# Patient Record
Sex: Female | Born: 1935 | Race: White | Hispanic: No | Smoking: Former smoker
Health system: Southern US, Community
[De-identification: ages and names within clinical notes are randomized; demographics above are authoritative.]

## PROBLEM LIST (undated history)

## (undated) DIAGNOSIS — Z8639 Personal history of other endocrine, nutritional and metabolic disease: Secondary | ICD-10-CM

## (undated) DIAGNOSIS — M899 Disorder of bone, unspecified: Secondary | ICD-10-CM

## (undated) DIAGNOSIS — L719 Rosacea, unspecified: Secondary | ICD-10-CM

## (undated) DIAGNOSIS — G4733 Obstructive sleep apnea (adult) (pediatric): Secondary | ICD-10-CM

## (undated) DIAGNOSIS — I480 Paroxysmal atrial fibrillation: Secondary | ICD-10-CM

## (undated) DIAGNOSIS — M949 Disorder of cartilage, unspecified: Secondary | ICD-10-CM

## (undated) DIAGNOSIS — R682 Dry mouth, unspecified: Secondary | ICD-10-CM

## (undated) DIAGNOSIS — E662 Morbid (severe) obesity with alveolar hypoventilation: Secondary | ICD-10-CM

## (undated) DIAGNOSIS — K2100 Gastro-esophageal reflux disease with esophagitis, without bleeding: Secondary | ICD-10-CM

## (undated) DIAGNOSIS — E559 Vitamin D deficiency, unspecified: Secondary | ICD-10-CM

## (undated) DIAGNOSIS — K5900 Constipation, unspecified: Secondary | ICD-10-CM

## (undated) DIAGNOSIS — J986 Disorders of diaphragm: Secondary | ICD-10-CM

## (undated) DIAGNOSIS — E876 Hypokalemia: Secondary | ICD-10-CM

## (undated) DIAGNOSIS — R059 Cough, unspecified: Secondary | ICD-10-CM

## (undated) DIAGNOSIS — K219 Gastro-esophageal reflux disease without esophagitis: Secondary | ICD-10-CM

## (undated) DIAGNOSIS — R5381 Other malaise: Secondary | ICD-10-CM

## (undated) DIAGNOSIS — I5032 Chronic diastolic (congestive) heart failure: Secondary | ICD-10-CM

## (undated) DIAGNOSIS — R32 Unspecified urinary incontinence: Secondary | ICD-10-CM

## (undated) DIAGNOSIS — J398 Other specified diseases of upper respiratory tract: Secondary | ICD-10-CM

## (undated) DIAGNOSIS — D631 Anemia in chronic kidney disease: Secondary | ICD-10-CM

## (undated) DIAGNOSIS — K635 Polyp of colon: Secondary | ICD-10-CM

## (undated) DIAGNOSIS — K117 Disturbances of salivary secretion: Secondary | ICD-10-CM

## (undated) DIAGNOSIS — I495 Sick sinus syndrome: Secondary | ICD-10-CM

## (undated) DIAGNOSIS — K5730 Diverticulosis of large intestine without perforation or abscess without bleeding: Secondary | ICD-10-CM

## (undated) DIAGNOSIS — Z950 Presence of cardiac pacemaker: Secondary | ICD-10-CM

## (undated) DIAGNOSIS — N189 Chronic kidney disease, unspecified: Principal | ICD-10-CM

## (undated) DIAGNOSIS — D7289 Other specified disorders of white blood cells: Secondary | ICD-10-CM

## (undated) DIAGNOSIS — E785 Hyperlipidemia, unspecified: Secondary | ICD-10-CM

## (undated) DIAGNOSIS — D4620 Refractory anemia with excess of blasts, unspecified: Secondary | ICD-10-CM

## (undated) DIAGNOSIS — R05 Cough: Secondary | ICD-10-CM

## (undated) DIAGNOSIS — D509 Iron deficiency anemia, unspecified: Secondary | ICD-10-CM

## (undated) DIAGNOSIS — F32A Depression, unspecified: Secondary | ICD-10-CM

## (undated) DIAGNOSIS — E860 Dehydration: Secondary | ICD-10-CM

## (undated) DIAGNOSIS — K8020 Calculus of gallbladder without cholecystitis without obstruction: Secondary | ICD-10-CM

## (undated) DIAGNOSIS — I10 Essential (primary) hypertension: Secondary | ICD-10-CM

## (undated) DIAGNOSIS — M1990 Unspecified osteoarthritis, unspecified site: Secondary | ICD-10-CM

## (undated) DIAGNOSIS — E039 Hypothyroidism, unspecified: Secondary | ICD-10-CM

## (undated) DIAGNOSIS — Z7901 Long term (current) use of anticoagulants: Secondary | ICD-10-CM

## (undated) DIAGNOSIS — E669 Obesity, unspecified: Secondary | ICD-10-CM

## (undated) DIAGNOSIS — F329 Major depressive disorder, single episode, unspecified: Secondary | ICD-10-CM

## (undated) HISTORY — DX: Hypokalemia: E87.6

## (undated) HISTORY — DX: Unspecified osteoarthritis, unspecified site: M19.90

## (undated) HISTORY — DX: Cough, unspecified: R05.9

## (undated) HISTORY — DX: Refractory anemia with excess of blasts, unspecified: D46.20

## (undated) HISTORY — DX: Disturbances of salivary secretion: K11.7

## (undated) HISTORY — DX: Unspecified urinary incontinence: R32

## (undated) HISTORY — DX: Vitamin D deficiency, unspecified: E55.9

## (undated) HISTORY — DX: Diverticulosis of large intestine without perforation or abscess without bleeding: K57.30

## (undated) HISTORY — DX: Paroxysmal atrial fibrillation: I48.0

## (undated) HISTORY — DX: Disorder of cartilage, unspecified: M94.9

## (undated) HISTORY — DX: Hyperlipidemia, unspecified: E78.5

## (undated) HISTORY — DX: Major depressive disorder, single episode, unspecified: F32.9

## (undated) HISTORY — DX: Obesity, unspecified: E66.9

## (undated) HISTORY — DX: Iron deficiency anemia, unspecified: D50.9

## (undated) HISTORY — DX: Other specified diseases of upper respiratory tract: J39.8

## (undated) HISTORY — DX: Gastro-esophageal reflux disease with esophagitis: K21.0

## (undated) HISTORY — DX: Constipation, unspecified: K59.00

## (undated) HISTORY — DX: Long term (current) use of anticoagulants: Z79.01

## (undated) HISTORY — DX: Rosacea, unspecified: L71.9

## (undated) HISTORY — DX: Gastro-esophageal reflux disease without esophagitis: K21.9

## (undated) HISTORY — DX: Depression, unspecified: F32.A

## (undated) HISTORY — DX: Essential (primary) hypertension: I10

## (undated) HISTORY — DX: Disorders of diaphragm: J98.6

## (undated) HISTORY — DX: Obstructive sleep apnea (adult) (pediatric): G47.33

## (undated) HISTORY — DX: Chronic kidney disease, unspecified: N18.9

## (undated) HISTORY — DX: Dehydration: E86.0

## (undated) HISTORY — DX: Hypothyroidism, unspecified: E03.9

## (undated) HISTORY — DX: Anemia in chronic kidney disease: D63.1

## (undated) HISTORY — DX: Sick sinus syndrome: I49.5

## (undated) HISTORY — DX: Calculus of gallbladder without cholecystitis without obstruction: K80.20

## (undated) HISTORY — DX: Chronic diastolic (congestive) heart failure: I50.32

## (undated) HISTORY — DX: Dry mouth, unspecified: R68.2

## (undated) HISTORY — DX: Personal history of other endocrine, nutritional and metabolic disease: Z86.39

## (undated) HISTORY — DX: Morbid (severe) obesity with alveolar hypoventilation: E66.2

## (undated) HISTORY — DX: Cough: R05

## (undated) HISTORY — DX: Gastro-esophageal reflux disease with esophagitis, without bleeding: K21.00

## (undated) HISTORY — DX: Other malaise: R53.81

## (undated) HISTORY — DX: Disorder of bone, unspecified: M89.9

## (undated) HISTORY — DX: Other specified disorders of white blood cells: D72.89

## (undated) HISTORY — DX: Presence of cardiac pacemaker: Z95.0

## (undated) HISTORY — DX: Polyp of colon: K63.5

---

## 1962-10-03 HISTORY — PX: TUBAL LIGATION: SHX77

## 1997-10-03 HISTORY — PX: TOTAL KNEE ARTHROPLASTY: SHX125

## 1998-10-03 HISTORY — PX: OTHER SURGICAL HISTORY: SHX169

## 2001-10-03 HISTORY — PX: TOTAL KNEE ARTHROPLASTY: SHX125

## 2006-10-03 HISTORY — PX: CATARACT EXTRACTION: SUR2

## 2009-11-09 ENCOUNTER — Encounter: Payer: Self-pay | Admitting: Internal Medicine

## 2009-11-18 ENCOUNTER — Encounter (INDEPENDENT_AMBULATORY_CARE_PROVIDER_SITE_OTHER): Payer: Self-pay | Admitting: *Deleted

## 2009-12-08 ENCOUNTER — Encounter (INDEPENDENT_AMBULATORY_CARE_PROVIDER_SITE_OTHER): Payer: Self-pay | Admitting: *Deleted

## 2009-12-09 ENCOUNTER — Ambulatory Visit: Payer: Self-pay | Admitting: Internal Medicine

## 2009-12-15 ENCOUNTER — Telehealth: Payer: Self-pay | Admitting: Internal Medicine

## 2009-12-15 ENCOUNTER — Ambulatory Visit: Payer: Self-pay | Admitting: Internal Medicine

## 2009-12-15 DIAGNOSIS — Z860100 Personal history of colon polyps, unspecified: Secondary | ICD-10-CM | POA: Insufficient documentation

## 2009-12-16 ENCOUNTER — Ambulatory Visit (HOSPITAL_COMMUNITY): Admission: RE | Admit: 2009-12-16 | Discharge: 2009-12-16 | Payer: Self-pay | Admitting: Internal Medicine

## 2010-02-01 ENCOUNTER — Encounter: Payer: Self-pay | Admitting: Internal Medicine

## 2010-02-10 DIAGNOSIS — J449 Chronic obstructive pulmonary disease, unspecified: Secondary | ICD-10-CM | POA: Insufficient documentation

## 2010-02-10 DIAGNOSIS — R32 Unspecified urinary incontinence: Secondary | ICD-10-CM | POA: Insufficient documentation

## 2010-02-10 DIAGNOSIS — K5730 Diverticulosis of large intestine without perforation or abscess without bleeding: Secondary | ICD-10-CM | POA: Insufficient documentation

## 2010-02-10 DIAGNOSIS — R1310 Dysphagia, unspecified: Secondary | ICD-10-CM | POA: Insufficient documentation

## 2010-02-10 DIAGNOSIS — R0602 Shortness of breath: Secondary | ICD-10-CM | POA: Insufficient documentation

## 2010-02-10 DIAGNOSIS — K117 Disturbances of salivary secretion: Secondary | ICD-10-CM | POA: Insufficient documentation

## 2010-02-10 DIAGNOSIS — R05 Cough: Secondary | ICD-10-CM

## 2010-02-10 DIAGNOSIS — I10 Essential (primary) hypertension: Secondary | ICD-10-CM | POA: Insufficient documentation

## 2010-02-10 DIAGNOSIS — J309 Allergic rhinitis, unspecified: Secondary | ICD-10-CM | POA: Insufficient documentation

## 2010-02-10 DIAGNOSIS — M1990 Unspecified osteoarthritis, unspecified site: Secondary | ICD-10-CM | POA: Insufficient documentation

## 2010-02-10 DIAGNOSIS — J4489 Other specified chronic obstructive pulmonary disease: Secondary | ICD-10-CM | POA: Insufficient documentation

## 2010-02-10 DIAGNOSIS — H259 Unspecified age-related cataract: Secondary | ICD-10-CM | POA: Insufficient documentation

## 2010-02-10 DIAGNOSIS — E785 Hyperlipidemia, unspecified: Secondary | ICD-10-CM | POA: Insufficient documentation

## 2010-02-10 DIAGNOSIS — R059 Cough, unspecified: Secondary | ICD-10-CM | POA: Insufficient documentation

## 2010-02-11 ENCOUNTER — Ambulatory Visit: Payer: Self-pay | Admitting: Internal Medicine

## 2010-02-12 LAB — CONVERTED CEMR LAB
AST: 50 units/L — ABNORMAL HIGH (ref 0–37)
Albumin: 3.2 g/dL — ABNORMAL LOW (ref 3.5–5.2)
Alkaline Phosphatase: 127 units/L — ABNORMAL HIGH (ref 39–117)
Basophils Absolute: 0 10*3/uL (ref 0.0–0.1)
Calcium: 8.4 mg/dL (ref 8.4–10.5)
GFR calc non Af Amer: 72.02 mL/min (ref >60)
Glucose, Bld: 115 mg/dL — ABNORMAL HIGH (ref 70–99)
Hemoglobin: 12.8 g/dL (ref 12.0–15.0)
Lymphocytes Relative: 30.8 % (ref 12.0–46.0)
Monocytes Relative: 7.4 % (ref 3.0–12.0)
Neutro Abs: 4.1 10*3/uL (ref 1.4–7.7)
RBC: 3.87 M/uL (ref 3.87–5.11)
RDW: 15 % — ABNORMAL HIGH (ref 11.5–14.6)
Sodium: 143 meq/L (ref 135–145)
WBC: 7 10*3/uL (ref 4.5–10.5)

## 2010-02-17 ENCOUNTER — Telehealth: Payer: Self-pay | Admitting: Internal Medicine

## 2010-03-08 ENCOUNTER — Ambulatory Visit: Payer: Self-pay | Admitting: Internal Medicine

## 2010-03-08 LAB — CONVERTED CEMR LAB
Basophils Absolute: 0 10*3/uL (ref 0.0–0.1)
CO2: 33 meq/L — ABNORMAL HIGH (ref 19–32)
Calcium: 8.9 mg/dL (ref 8.4–10.5)
Eosinophils Absolute: 0.1 10*3/uL (ref 0.0–0.7)
Glucose, Bld: 154 mg/dL — ABNORMAL HIGH (ref 70–99)
HCT: 40.2 % (ref 36.0–46.0)
Hemoglobin: 13.7 g/dL (ref 12.0–15.0)
Lymphs Abs: 1.8 10*3/uL (ref 0.7–4.0)
MCHC: 34.2 g/dL (ref 30.0–36.0)
Neutro Abs: 5 10*3/uL (ref 1.4–7.7)
Potassium: 4.1 meq/L (ref 3.5–5.1)
RDW: 14.7 % — ABNORMAL HIGH (ref 11.5–14.6)
Sodium: 140 meq/L (ref 135–145)

## 2010-03-09 ENCOUNTER — Ambulatory Visit: Payer: Self-pay | Admitting: Cardiology

## 2010-03-26 ENCOUNTER — Ambulatory Visit: Payer: Self-pay | Admitting: Internal Medicine

## 2010-03-29 ENCOUNTER — Ambulatory Visit: Payer: Self-pay | Admitting: Internal Medicine

## 2010-06-14 ENCOUNTER — Encounter: Payer: Self-pay | Admitting: Internal Medicine

## 2010-07-05 ENCOUNTER — Ambulatory Visit (HOSPITAL_COMMUNITY): Admission: RE | Admit: 2010-07-05 | Discharge: 2010-07-05 | Payer: Self-pay | Admitting: Internal Medicine

## 2010-08-23 ENCOUNTER — Ambulatory Visit (HOSPITAL_COMMUNITY)
Admission: RE | Admit: 2010-08-23 | Discharge: 2010-08-23 | Payer: Self-pay | Source: Home / Self Care | Admitting: Internal Medicine

## 2010-09-07 ENCOUNTER — Inpatient Hospital Stay (HOSPITAL_COMMUNITY)
Admission: EM | Admit: 2010-09-07 | Discharge: 2010-09-12 | Payer: Self-pay | Source: Home / Self Care | Attending: Cardiovascular Disease | Admitting: Cardiovascular Disease

## 2010-09-08 ENCOUNTER — Encounter: Payer: Self-pay | Admitting: Cardiovascular Disease

## 2010-09-15 ENCOUNTER — Ambulatory Visit: Payer: Self-pay | Admitting: Cardiology

## 2010-09-15 LAB — CONVERTED CEMR LAB: POC INR: 2.5

## 2010-09-17 ENCOUNTER — Encounter
Admission: RE | Admit: 2010-09-17 | Discharge: 2010-09-17 | Payer: Self-pay | Source: Home / Self Care | Attending: Internal Medicine | Admitting: Internal Medicine

## 2010-09-21 ENCOUNTER — Encounter: Payer: Self-pay | Admitting: Family Medicine

## 2010-09-21 ENCOUNTER — Inpatient Hospital Stay (HOSPITAL_COMMUNITY)
Admission: EM | Admit: 2010-09-21 | Discharge: 2010-09-26 | Payer: Self-pay | Source: Home / Self Care | Attending: Cardiology | Admitting: Cardiology

## 2010-09-21 DIAGNOSIS — I4891 Unspecified atrial fibrillation: Secondary | ICD-10-CM | POA: Insufficient documentation

## 2010-09-22 ENCOUNTER — Ambulatory Visit: Payer: Self-pay

## 2010-09-25 ENCOUNTER — Encounter: Payer: Self-pay | Admitting: Internal Medicine

## 2010-09-27 ENCOUNTER — Encounter: Payer: Self-pay | Admitting: Physician Assistant

## 2010-09-27 ENCOUNTER — Emergency Department (HOSPITAL_COMMUNITY)
Admission: EM | Admit: 2010-09-27 | Discharge: 2010-09-27 | Payer: Self-pay | Source: Home / Self Care | Admitting: Emergency Medicine

## 2010-09-28 ENCOUNTER — Encounter: Payer: Self-pay | Admitting: Internal Medicine

## 2010-09-29 ENCOUNTER — Telehealth: Payer: Self-pay | Admitting: Cardiology

## 2010-09-29 ENCOUNTER — Encounter: Payer: Self-pay | Admitting: Internal Medicine

## 2010-09-30 ENCOUNTER — Encounter: Payer: Self-pay | Admitting: Internal Medicine

## 2010-10-01 ENCOUNTER — Encounter: Payer: Self-pay | Admitting: Internal Medicine

## 2010-10-01 ENCOUNTER — Ambulatory Visit: Payer: Self-pay | Admitting: Cardiology

## 2010-10-01 ENCOUNTER — Encounter: Payer: Self-pay | Admitting: Physician Assistant

## 2010-10-01 DIAGNOSIS — I495 Sick sinus syndrome: Secondary | ICD-10-CM | POA: Insufficient documentation

## 2010-10-02 ENCOUNTER — Encounter: Payer: Self-pay | Admitting: Internal Medicine

## 2010-10-03 ENCOUNTER — Encounter: Payer: Self-pay | Admitting: Internal Medicine

## 2010-10-04 ENCOUNTER — Encounter: Payer: Self-pay | Admitting: Internal Medicine

## 2010-10-05 ENCOUNTER — Encounter: Payer: Self-pay | Admitting: Internal Medicine

## 2010-10-07 ENCOUNTER — Encounter: Payer: Self-pay | Admitting: Internal Medicine

## 2010-10-07 ENCOUNTER — Ambulatory Visit
Admission: RE | Admit: 2010-10-07 | Discharge: 2010-10-07 | Payer: Self-pay | Source: Home / Self Care | Attending: Internal Medicine | Admitting: Internal Medicine

## 2010-10-07 DIAGNOSIS — R609 Edema, unspecified: Secondary | ICD-10-CM | POA: Insufficient documentation

## 2010-10-11 ENCOUNTER — Ambulatory Visit: Admit: 2010-10-11 | Payer: Self-pay | Admitting: Internal Medicine

## 2010-10-13 ENCOUNTER — Encounter: Payer: Self-pay | Admitting: Internal Medicine

## 2010-10-14 ENCOUNTER — Encounter: Payer: Self-pay | Admitting: Internal Medicine

## 2010-10-14 LAB — CONVERTED CEMR LAB
POC INR: 1.71
Prothrombin Time: 20.2 s

## 2010-10-18 ENCOUNTER — Encounter: Payer: Self-pay | Admitting: Internal Medicine

## 2010-10-19 ENCOUNTER — Encounter (INDEPENDENT_AMBULATORY_CARE_PROVIDER_SITE_OTHER): Payer: Self-pay | Admitting: Pharmacist

## 2010-10-21 ENCOUNTER — Ambulatory Visit (HOSPITAL_COMMUNITY)
Admission: RE | Admit: 2010-10-21 | Discharge: 2010-10-22 | Payer: Self-pay | Source: Home / Self Care | Attending: Internal Medicine | Admitting: Internal Medicine

## 2010-10-21 ENCOUNTER — Ambulatory Visit (HOSPITAL_BASED_OUTPATIENT_CLINIC_OR_DEPARTMENT_OTHER): Payer: Medicare Other | Admitting: Hematology & Oncology

## 2010-10-21 HISTORY — PX: PACEMAKER PLACEMENT: SHX43

## 2010-10-22 ENCOUNTER — Encounter: Payer: Self-pay | Admitting: Internal Medicine

## 2010-10-23 ENCOUNTER — Encounter: Payer: Self-pay | Admitting: Internal Medicine

## 2010-10-23 NOTE — Discharge Summary (Signed)
  NAME:  Cindy Cervantes, Cindy Cervantes               ACCOUNT NO.:  [id number]  MEDICAL RECORD NO.:  [id number]          PATIENT TYPE:  INP  LOCATION:  3742                         FACILITY:  [facility]  PHYSICIAN:  Luis Abed, MD, FACCDATE OF BIRTH:  Jul 26, 1932  DATE OF ADMISSION:  09/21/2010 DATE OF DISCHARGE:  09/26/2010                              DISCHARGE SUMMARY   ADDENDUM  Followup labs concerning [name] revealed a BMET; sodium of 136, potassium of 3.4, chloride of 93, CO2 of 32, glucose 184, BUN 25, and creatinine of 1.35.  These results have been discussed with Dr. Myrtis Ser. The patient will be given 40 mEq of potassium replacement x1.  She will return home on 20 mEq potassium daily.  It is advised that the patient not be restarted on spironolactone as she was very sensitive to this causing her to have hyperkalemia.  Any restarting of this medication should be done with caution.     [name]. [name], NP   ______________________________ Luis Abed, MD, [facility]    KML/MEDQ  D:  09/26/2010  T:  09/27/2010  Job:  [id number]  Electronically Signed by Joni Reining NP on 10/14/2010 03:47:39 PM Electronically Signed by Willa Rough MD FACC on 10/23/2010 09:10:12 AM

## 2010-10-25 LAB — CBC
Hemoglobin: 9.6 g/dL — ABNORMAL LOW (ref 12.0–15.0)
MCH: 33 pg (ref 26.0–34.0)
MCHC: 31.3 g/dL (ref 30.0–36.0)

## 2010-10-25 LAB — APTT: aPTT: 37 seconds (ref 24–37)

## 2010-10-25 LAB — PROTIME-INR: Prothrombin Time: 22.9 seconds — ABNORMAL HIGH (ref 11.6–15.2)

## 2010-10-25 LAB — SURGICAL PCR SCREEN: MRSA, PCR: NEGATIVE

## 2010-10-27 ENCOUNTER — Encounter: Payer: Self-pay | Admitting: Internal Medicine

## 2010-10-27 ENCOUNTER — Inpatient Hospital Stay (HOSPITAL_COMMUNITY)
Admission: EM | Admit: 2010-10-27 | Discharge: 2010-11-01 | Payer: Self-pay | Source: Home / Self Care | Attending: Internal Medicine | Admitting: Internal Medicine

## 2010-10-27 LAB — GLUCOSE, CAPILLARY: Glucose-Capillary: 127 mg/dL — ABNORMAL HIGH (ref 70–99)

## 2010-10-27 LAB — BASIC METABOLIC PANEL
BUN: 17 mg/dL (ref 6–23)
Chloride: 89 mEq/L — ABNORMAL LOW (ref 96–112)
Glucose, Bld: 121 mg/dL — ABNORMAL HIGH (ref 70–99)
Potassium: 4.2 mEq/L (ref 3.5–5.1)

## 2010-10-27 LAB — DIFFERENTIAL
Basophils Absolute: 0 10*3/uL (ref 0.0–0.1)
Basophils Relative: 0 % (ref 0–1)
Eosinophils Absolute: 0.1 10*3/uL (ref 0.0–0.7)
Eosinophils Relative: 1 % (ref 0–5)
Monocytes Absolute: 0.8 10*3/uL (ref 0.1–1.0)
Neutro Abs: 6.3 10*3/uL (ref 1.7–7.7)

## 2010-10-27 LAB — CBC
MCHC: 32.2 g/dL (ref 30.0–36.0)
Platelets: 319 10*3/uL (ref 150–400)
RDW: 15.9 % — ABNORMAL HIGH (ref 11.5–15.5)

## 2010-10-27 LAB — POCT CARDIAC MARKERS
CKMB, poc: 1.1 ng/mL (ref 1.0–8.0)
CKMB, poc: <1 ng/mL — ABNORMAL LOW (ref 1.0–8.0)
Myoglobin, poc: 158 ng/mL (ref 12–200)
Myoglobin, poc: 48.9 ng/mL (ref 12–200)
Troponin i, poc: <0.05 ng/mL (ref 0.00–0.09)

## 2010-10-27 LAB — PROTIME-INR
INR: 2.52 — ABNORMAL HIGH (ref 0.00–1.49)
Prothrombin Time: 27.3 seconds — ABNORMAL HIGH (ref 11.6–15.2)

## 2010-10-27 LAB — BRAIN NATRIURETIC PEPTIDE: Pro B Natriuretic peptide (BNP): 567 pg/mL — ABNORMAL HIGH (ref 0.0–100.0)

## 2010-10-28 ENCOUNTER — Ambulatory Visit: Admit: 2010-10-28 | Payer: Self-pay | Admitting: Internal Medicine

## 2010-10-28 DIAGNOSIS — G4710 Hypersomnia, unspecified: Secondary | ICD-10-CM

## 2010-10-28 DIAGNOSIS — G4730 Sleep apnea, unspecified: Secondary | ICD-10-CM

## 2010-10-28 DIAGNOSIS — R0902 Hypoxemia: Secondary | ICD-10-CM

## 2010-10-28 DIAGNOSIS — E662 Morbid (severe) obesity with alveolar hypoventilation: Secondary | ICD-10-CM

## 2010-10-28 LAB — MRSA PCR SCREENING: MRSA by PCR: NEGATIVE

## 2010-10-29 LAB — BASIC METABOLIC PANEL
Calcium: 9.1 mg/dL (ref 8.4–10.5)
Creatinine, Ser: 1.33 mg/dL — ABNORMAL HIGH (ref 0.4–1.2)
GFR calc Af Amer: 47 mL/min — ABNORMAL LOW (ref >60)
GFR calc non Af Amer: 39 mL/min — ABNORMAL LOW (ref >60)
Sodium: 140 mEq/L (ref 135–145)

## 2010-10-29 LAB — PROTIME-INR: INR: 3.03 — ABNORMAL HIGH (ref 0.00–1.49)

## 2010-10-30 LAB — PROTIME-INR
INR: 2.88 — ABNORMAL HIGH (ref 0.00–1.49)
Prothrombin Time: 30.2 seconds — ABNORMAL HIGH (ref 11.6–15.2)

## 2010-10-30 LAB — BASIC METABOLIC PANEL
BUN: 13 mg/dL (ref 6–23)
Calcium: 8.9 mg/dL (ref 8.4–10.5)
Creatinine, Ser: 1.35 mg/dL — ABNORMAL HIGH (ref 0.4–1.2)
GFR calc non Af Amer: 38 mL/min — ABNORMAL LOW (ref >60)

## 2010-10-31 LAB — BASIC METABOLIC PANEL
BUN: 13 mg/dL (ref 6–23)
CO2: 38 mEq/L — ABNORMAL HIGH (ref 19–32)
Chloride: 83 mEq/L — ABNORMAL LOW (ref 96–112)
Creatinine, Ser: 1.34 mg/dL — ABNORMAL HIGH (ref 0.4–1.2)

## 2010-11-01 LAB — CBC
MCH: 33 pg (ref 26.0–34.0)
MCV: 107 fL — ABNORMAL HIGH (ref 78.0–100.0)
Platelets: 192 10*3/uL (ref 150–400)
RBC: 2.85 MIL/uL — ABNORMAL LOW (ref 3.87–5.11)
RDW: 16.7 % — ABNORMAL HIGH (ref 11.5–15.5)

## 2010-11-01 LAB — BASIC METABOLIC PANEL
CO2: 43 mEq/L (ref 19–32)
Calcium: 8.8 mg/dL (ref 8.4–10.5)
Chloride: 82 mEq/L — ABNORMAL LOW (ref 96–112)
Glucose, Bld: 107 mg/dL — ABNORMAL HIGH (ref 70–99)
Sodium: 140 mEq/L (ref 135–145)

## 2010-11-02 NOTE — Letter (Signed)
Summary: Previsit letter  [facility]  [address] [place], [place] [address]   Phone: [phone]  Fax: [phone]       11/18/2009 MRN: [id number]  [facility] [address] [place], [place]  [address]  Dear Cindy Cervantes,  Welcome to the Gastroenterology Division at [facility]    You are scheduled to see a nurse for your pre-procedure visit on 12-09-09 at 1PM on the 3rd floor at [facility], [address]  We ask that you try to arrive at our office 15 minutes prior to your appointment time to allow for check-in.  Your nurse visit will consist of discussing your medical and surgical history, your immediate family medical history, and your medications.    Please bring a complete list of all your medications or, if you prefer, bring the medication bottles and we will list them.  We will need to be aware of both prescribed and over the counter drugs.  We will need to know exact dosage information as well.  If you are on blood thinners (Coumadin, Plavix, Aggrenox, Ticlid, etc.) please call our office today/prior to your appointment, as we need to consult with your physician about holding your medication.   Please be prepared to read and sign documents such as consent forms, a financial agreement, and acknowledgement forms.  If necessary, and with your consent, a friend or relative is welcome to sit-in on the nurse visit with you.  Please bring your insurance card so that we may make a copy of it.  If your insurance requires a referral to see a specialist, please bring your referral form from your primary care physician.  No co-pay is required for this nurse visit.     If you cannot keep your appointment, please call [phone] to cancel or reschedule prior to your appointment date.  This allows [place] the opportunity to schedule an appointment for another patient in need of care.    Thank you for choosing [facility] for your medical needs.  We  appreciate the opportunity to care for you.  Please visit [place] at our website  to learn more about our practice.                     Sincerely.                                                                                                                   The Gastroenterology Division

## 2010-11-02 NOTE — Letter (Signed)
Summary: [facility]   Imported By: Sherian Rein 02/17/2010 08:19:57  _____________________________________________________________________  External Attachment:    Type:   Image     Comment:   External Document

## 2010-11-02 NOTE — Miscellaneous (Signed)
Summary: [facility]  Clinical Lists Changes  Medications: Added new medication of MOVIPREP 100 GM  SOLR (PEG-KCL-NACL-NASULF-NA ASC-C) As per prep instructions. - Signed Rx of MOVIPREP 100 GM  SOLR (PEG-KCL-NACL-NASULF-NA ASC-C) As per prep instructions.;  #1 x 0;  Signed;  Entered by: Ezra Sites RN;  Authorized by: Hart Carwin MD;  Method used: Electronically to [facility]. #[id number]*, [address] [place], [place], [place]  [address], Ph: [phone], Fax: [phone] Allergies: Added new allergy or adverse reaction of MORPHINE Observations: Added new observation of NKA: F (12/09/2009 12:46)    Prescriptions: MOVIPREP 100 GM  SOLR (PEG-KCL-NACL-NASULF-NA ASC-C) As per prep instructions.  #1 x 0   Entered by:   Ezra Sites RN   Authorized by:   Hart Carwin MD   Signed by:   Ezra Sites RN on 12/09/2009   Method used:   Electronically to        [facility]. [phone]* (retail)       [phone]       [place], [place]  [address]       Ph: [phone]       Fax: [phone]   RxID:   [id number]

## 2010-11-02 NOTE — Assessment & Plan Note (Signed)
Summary: Pulmonary/ ext ov with walking sats = ok after 1 lap   Copy to:  pcp Primary Provider/Referring Provider:  Dr. Murray Hodgkins  CC:  Acute visit.  Pt c/o increased SOB x 3 wks.  She gets out of breath walking from room to room at home.  She also feels very fatigued and states "no energy at all".  She states that her cough is the same- "not bad"..  History of Present Illness: 63 yowf quit smoking 1984 with complications from Tummy tuck in [place] 2001.  Feb 11, 2010  variable doe x 2001 better after singing  worse x 6 month > best days can do  grocery shopping but not mall due to legs give out assoc with cough and sensation of pnd and cough to point of vomiting  cough is worse when lie down  but breathing ok,  assoc wih variable leg swelling. imp was uacs and ? diast chf  rec: Stop hctz and potassium Start Maxzide 75 one daily start nexium 40 mg Take  one 30-60 min before first meal of the day and pepcid 20 mg one at bedtime if drainage use chlortrimeton 4 mg one at bedtime  March 08, 2010  cc  fatigued and states "no energy at all".  She states that her cough is the same- "not bad".   cough is much better.   sleeping ok flat.  can't walk 50 feet without symptoms of weak and sob. leg swelling gone.  no orthostatic lightheadedness.  Pt denies any significant sore throat, dysphagia, itching, sneezing,  nasal congestion or excess secretions,  fever, chills, sweats, unintended wt loss, pleuritic or exertional cp, hempoptysis, orthopnea pnd.   Pt also denies any obvious fluctuation in symptoms with weather or environmental change or other alleviating or aggravating factors.       Current Medications (verified): 1)  Niaspan 1000 Mg Cr-Tabs (Niacin (Antihyperlipidemic)) .... Take 1 Tablet By Mouth Two Times A Day 2)  Xyzal 5 Mg Tabs (Levocetirizine Dihydrochloride) .... Take 1 Tablet By Mouth Once A Day 3)  Aspirin 81 Mg  Tabs (Aspirin) .... Take 1 Tablet By Mouth Once A Day 4)  Maxzide 75-50  Mg Tabs (Triamterene-Hctz) .... One Daily 5)  Nexium 40 Mg  Cpdr (Esomeprazole Magnesium) .... Take  One 30-60 Min Before First Meal of The Day 6)  Pepcid 20 Mg Tabs (Famotidine) .Marland Kitchen.. 1 At Bedtime 7)  Chlor-Trimeton 4 Mg Tabs (Chlorpheniramine Maleate) .... One Every 6 Hours As Needed For Drainage  Allergies (verified): 1)  ! Morphine 2)  ! Toviaz (Fesoterodine Fumarate)  Past History:  Past Medical History: URINARY INCONTINENCE (ICD-788.30) KNEE JOINT REPLACEMENT BY OTHER MEANS (ICD-V43.65) OSTEOARTHRITIS (ICD-715.90) C O P D (ICD-496)  pft's rec...................................Marland KitchenSherene Sires    - PFT's March 29 2010 >> OBESITY     - HC03 36  Feb 11 2010  ? OHS ALLERGIC RHINITIS (ICD-477.9) HYPERTENSION (ICD-401.9) UNSPECIFIED SENILE CATARACT (ICD-366.10) HYPERLIPIDEMIA (ICD-272.4) XEROSTOMIA (ICD-527.7) DYSPHAGIA UNSPECIFIED (ICD-787.20) COUGH (ICD-786.2)      - Better on gerd rx March 08, 2010      - Sinus ct March 08, 2010 >> OBSTRUCTIVE SLEEP APNEA (ICD-327.23) DIVERTICULOSIS, COLON (ICD-562.10) DYSPNEA (ICD-786.05)     - No desat walking March 08, 2010      - CT chest March 09 2010 >>> PERSONAL HISTORY OF COLONIC POLYPS (ICD-V12.72)    Vital Signs:  Patient profile:   77 year old female Weight:  211 pounds O2 Sat:      95 % on Room air Temp:     97.5 degrees F oral Pulse rate:   85 / minute BP sitting:   128 / 80  (right arm) Cuff size:   large  Vitals Entered By: Cindy Cervantes (March 08, 2010 9:32 AM)  O2 Flow:  Room air  Serial Vital Signs/Assessments:  Comments: 10:07 AM Ambulatory Pulse Oximetry  Resting; HR__88___    02 Sat__95%ra___  Lap1 (185 feet)   HR__101___   02 Sat__94%ra___ Lap2 (185 feet)   HR_____   02 Sat_____    Lap3 (185 feet)   HR_____   02 Sat_____  ___Test Completed without Difficulty _x__Test Stopped due to:pt c/o feeling shaky and 'a little SOB'.   By: Cindy Cervantes    Physical Exam  Additional Exam:  amb obese wf nad  somewhat depressed appearing, minimally hoarse wt 224 Feb 11, 2010 > 211 March 08, 2010  HEENT: nl dentition, turbinates, and orophanx. Nl external ear canals without cough reflex NECK :  without JVD/Nodes/TM/ nl carotid upstrokes bilaterally LUNGS: no acc muscle use, clear to A and P bilaterally without cough on insp or exp maneuvers CV:  RRR  no s3 or murmur or increase in P2, no edema now ABD:  soft and nontender with nl excursion in the supine position. No bruits or organomegaly, bowel sounds nl MS:  warm without deformities, calf tenderness, cyanosis or clubbing      Sodium                    140 mEq/L                   135-145   Potassium                 4.1 mEq/L                   3.5-5.1   Chloride                  100 mEq/L                   96-112   Carbon Dioxide       [H]  33 mEq/L                    19-32   Glucose              [H]  154 mg/dL                   95-63   BUN                       19 mg/dL                    8-75   Creatinine                1.0 mg/dL                   6.4-3.3   Calcium                   8.9 mg/dL                   2.9-51.8   GFR  60.03 mL/min                >60  Tests: (2) CBC Platelet w/Diff (CBCD)   White Cell Count          7.4 K/uL                    4.5-10.5   Red Cell Count            4.14 Mil/uL                 3.87-5.11   Hemoglobin                13.7 g/dL                   13.2-44.0   Hematocrit                40.2 %                      36.0-46.0   MCV                       97.0 fl                     78.0-100.0   MCHC                      34.2 g/dL                   10.2-72.5   RDW                  [H]  14.7 %                      11.5-14.6   Platelet Count            188.0 K/uL                  150.0-400.0   Neutrophil %              67.4 %                      43.0-77.0   Lymphocyte %              24.3 %                      12.0-46.0   Monocyte %                6.3 %                       3.0-12.0    Eosinophils%              1.7 %                       0.0-5.0   Basophils %               0.3 %                       0.0-3.0   Neutrophill Absolute      5.0 K/uL                    1.4-7.7   Lymphocyte Absolute  1.8 K/uL                    0.7-4.0   Monocyte Absolute         0.5 K/uL                    0.1-1.0  Eosinophils, Absolute                             0.1 K/uL                    0.0-0.7   Basophils Absolute        0.0 K/uL                    0.0-0.1  Tests: (3) B-Type Natiuretic Peptide (BNPR)  B-Type Natriuetic Peptide                             96.8 pg/mL                  0.0-100.0  Impression & Recommendations:  Problem # 1:  DYSPNEA (ICD-786.05) Not reproducible here, more tired than weak  Problem # 2:  ABNORMAL LUNG XRAY (ICD-793.1) needs chest ct after check renal function again  Problem # 3:  C O P D (ICD-496) For f/u pft's this month.  Problem # 4:  COUGH (ICD-786.2) Much better but not completely resolved.  Most likely this is Upper airway cough syndrome, so named because it's frequently impossible to sort out how much is  CR/sinusitis with freq throat clearing (which can be related to primary GERD)   vs  causing  secondary extra esophageal GERD from wide swings in gastric pressure that occur with throat clearing, promoting self use of mint and menthol lozenges that reduce the lower esophageal sphincter tone and exacerbate the problem further These are the same pts who not infrequently have failed to tolerate ace inhibitors,  dry powder inhalers or biphosphonates or report having reflux symptoms that don't respond to standard doses of PPI  Next step is Sinus Ct, continue gerd rx.  Each maintenance medication was reviewed in detail including most importantly the difference between maintenance prns and under what circumstances the prns are to be used.  In addition, these two groups (for which the patient should keep up with refills) were distinguished from a  third group :  meds that are used only short term with the intent to complete a course of therapy and then not refill them.  The med list was then fully reconciled and reorganized to reflect this important distinction.   Medications Added to Medication List This Visit: 1)  Pepcid 20 Mg Tabs (Famotidine) .Marland Kitchen.. 1 at bedtime  Other Orders: Pulse Oximetry, Ambulatory ([address]) EKG w/ Interpretation (93000) Est. Patient Level IV ([address]) TLB-BMP (Basic Metabolic Panel-BMET) (80048-METABOL) TLB-CBC Platelet - w/Differential (85025-CBCD) TLB-BNP (B-Natriuretic Peptide) (83880-BNPR) Misc. Referral (Misc. Ref) Misc. Referral (Misc. Ref)  Patient Instructions: 1)  See Patient Care Coordinator before leaving for CT of chest  and sinus which need  to be done as soon as we recheck your kidney function   Cardio[employer] ECG  ID: [id number] Patient: Cindy Cervantes, Cindy Cervantes DOB: [birth year] Age: 77 Years Old Sex: Female Race: White Height: 64 Weight: 211 Status: Unconfirmed Past Medical History:  URINARY INCONTINENCE (ICD-788.30) KNEE JOINT REPLACEMENT BY OTHER MEANS (ICD-V43.65) OSTEOARTHRITIS (ICD-715.90) C  O P D (ICD-496)  pft's rec...................................Marland KitchenSherene Sires OBESITY     - WJ19 14  Feb 11 2010  ? OHS ALLERGIC RHINITIS (ICD-477.9) HYPERTENSION (ICD-401.9) UNSPECIFIED SENILE CATARACT (ICD-366.10) HYPERLIPIDEMIA (ICD-272.4) XEROSTOMIA (ICD-527.7) DYSPHAGIA UNSPECIFIED (ICD-787.20) COUGH (ICD-786.2) OBSTRUCTIVE SLEEP APNEA (ICD-327.23) DIVERTICULOSIS, COLON (ICD-562.10) DYSPNEA (ICD-786.05) PERSONAL HISTORY OF COLONIC POLYPS (ICD-V12.72)   Recorded: 03/08/2010 09:53 AM P/PR: 132 ms / 137 ms - Heart rate (maximum exercise) QRS: 86 QT/QTc/QTd: 373 ms / 412 ms / 142 ms - Heart rate (maximum exercise)  P/QRS/T axis: 62 deg / 51 deg / 31 deg - Heart rate (maximum exercise)  Heartrate: 82 bpm  Interpretation:   sinus rhythm  premature supraventricular complexes  RSR' in V1  minimal  right-precordial repolarization disturbance, consider feminine pattern   flat or low negative T in V2 V3   Borderline ECG

## 2010-11-02 NOTE — Progress Notes (Signed)
Summary: talk to nurse  Phone Note Call from Patient   Caller: Patient Call For: Cindy Cervantes Summary of Call: returning nurse's call Initial call taken by: Rickard Patience,  Feb 17, 2010 1:36 PM  Follow-up for Phone Call        pt informed of MW recs for allergy panel. Zackery Barefoot CMA  Feb 17, 2010 2:04 PM

## 2010-11-02 NOTE — Miscellaneous (Signed)
Summary: Orders Update pft charges  Clinical Lists Changes  Orders: Added new Service order of Carbon Monoxide diffusing w/capacity (94720) - Signed Added new Service order of Lung Volumes (94240) - Signed Added new Service order of Spirometry (Pre & Post) (94060) - Signed 

## 2010-11-02 NOTE — Letter (Signed)
Summary: [facility] Instructions  [facility]  [address] [place], [place] [address]   Phone: [phone]  Fax: [phone]       Cindy Cervantes    May 08, 1932    MRN: [id number]        Procedure Day /Date:  Tuesday  12/15/09     Arrival Time:  2:00pm     Procedure Time:  3:00pm     Location of Procedure:                    _X _  [facility] (4th Floor)                        PREPARATION FOR COLONOSCOPY WITH MOVIPREP   Starting 5 days prior to your procedure  Thursday 03/10  do not eat nuts, seeds, popcorn, corn, beans, peas,  salads, or any raw vegetables.  Do not take any fiber supplements (e.g. Metamucil, Citrucel, and Benefiber).  THE DAY BEFORE YOUR PROCEDURE         DATE: 03/14   DAY: Monday  1.  Drink clear liquids the entire day-NO SOLID FOOD  2.  Do not drink anything colored red or purple.  Avoid juices with pulp.  No orange juice.  3.  Drink at least 64 oz. (8 glasses) of fluid/clear liquids during the day to prevent dehydration and help the prep work efficiently.  CLEAR LIQUIDS INCLUDE: Water Jello Ice Popsicles Tea (sugar ok, no milk/cream) Powdered fruit flavored drinks Coffee (sugar ok, no milk/cream) Gatorade Juice: apple, white grape, white cranberry  Lemonade Clear bullion, consomm, broth Carbonated beverages (any kind) Strained chicken noodle soup Hard Candy                             4.  In the morning, mix first dose of MoviPrep solution:    Empty 1 Pouch A and 1 Pouch B into the disposable container    Add lukewarm drinking water to the top line of the container. Mix to dissolve    Refrigerate (mixed solution should be used within 24 hrs)  5.  Begin drinking the prep at 5:00 p.m. The MoviPrep container is divided by 4 marks.   Every 15 minutes drink the solution down to the next mark (approximately 8 oz) until the full liter is complete.   6.  Follow completed prep with 16 oz of clear liquid of your choice  (Nothing red or purple).  Continue to drink clear liquids until bedtime.  7.  Before going to bed, mix second dose of MoviPrep solution:    Empty 1 Pouch A and 1 Pouch B into the disposable container    Add lukewarm drinking water to the top line of the container. Mix to dissolve    Refrigerate  THE DAY OF YOUR PROCEDURE      DATE:  03/15  DAY: Tuesday  Beginning at  10:00 a.m. (5 hours before procedure):         1. Every 15 minutes, drink the solution down to the next mark (approx 8 oz) until the full liter is complete.  2. Follow completed prep with 16 oz. of clear liquid of your choice.    3. You may drink clear liquids until 1:00pm  (2 HOURS BEFORE PROCEDURE).   MEDICATION INSTRUCTIONS  Unless otherwise instructed, you should take regular prescription medications with a small sip of water  as early as possible the morning of your procedure.    Additional medication instructions:  Hold HCTZ morning of procedure.         OTHER INSTRUCTIONS  You will need a responsible adult at least 75 years of age to accompany you and drive you home.   This person must remain in the waiting room during your procedure.  Wear loose fitting clothing that is easily removed.  Leave jewelry and other valuables at home.  However, you may wish to bring a book to read or  an iPod/MP3 player to listen to music as you wait for your procedure to start.  Remove all body piercing jewelry and leave at home.  Total time from sign-in until discharge is approximately 2-3 hours.  You should go home directly after your procedure and rest.  You can resume normal activities the  day after your procedure.  The day of your procedure you should not:   Drive   Make legal decisions   Operate machinery   Drink alcohol   Return to work  You will receive specific instructions about eating, activities and medications before you leave.    The above instructions have been reviewed and explained  to me by   Ezra Sites RN  December 09, 2009 1:10 PM   I fully understand and can verbalize these instructions _____________________________ Date _________

## 2010-11-02 NOTE — Assessment & Plan Note (Signed)
Summary: Pulmonary/ new pt eval ? uacs > rx max acid    Visit Type:  Initial Consult Copy to:  pcp Primary Provider/Referring Provider:  Dr. Murray Hodgkins  CC:  Pt here for pulmonary consult. Pt c/o SOB intermittent x 10 years after having "tummy tuck" worse with exertion.  History of Present Illness: 22 yowf quit smoking 1984 with complications from Tummy tuck in [place] 2001.  Feb 11, 2010  variable doe x 2001 better after singing  worse x 6 month > best days can do  grocery shopping but not mall due to legs give out assoc with cough and sensation of pnd and cough to point of vomiting  cough is worse when lie down  but breathing ok,  assoc wih variable leg swelling. Pt denies any significant sore throat, dysphagia, itching, sneezing,  nasal congestion or excess secretions,  fever, chills, sweats, unintended wt loss, pleuritic or exertional cp, hempoptysis, change in activity tolerance  orthopnea pnd.  Pt also denies any obvious fluctuation in symptoms with weather or environmental change or other alleviating or aggravating factors.       Preventive Screening-Counseling & Management  Alcohol-Tobacco     Alcohol drinks/day: <1     Alcohol type: wine     Smoking Status: quit     Packs/Day: 0.5     Year Started: 1952     Year Quit: 1984  Current Medications (verified): 1)  Hydrochlorothiazide 25 Mg Tabs (Hydrochlorothiazide) .... Take 1 Tablet By Mouth Once A Day 2)  Klor-Con M20 20 Meq Cr-Tabs (Potassium Chloride Crys Cr) .... Take 1 Tablet By Mouth Once A Day 3)  Niaspan 1000 Mg Cr-Tabs (Niacin (Antihyperlipidemic)) .... Take 1 Tablet By Mouth Two Times A Day 4)  Xyzal 5 Mg Tabs (Levocetirizine Dihydrochloride) .... Take 1 Tablet By Mouth Once A Day 5)  Aspirin 81 Mg  Tabs (Aspirin) .... Take 1 Tablet By Mouth Once A Day  Allergies (verified): 1)  ! Morphine 2)  ! Toviaz (Fesoterodine Fumarate)  Past History:  Past Medical History: URINARY INCONTINENCE (ICD-788.30) KNEE JOINT  REPLACEMENT BY OTHER MEANS (ICD-V43.65) OSTEOARTHRITIS (ICD-715.90) C O P D (ICD-496)  pft's rec...................................Marland KitchenSherene Sires OBESITY     - [id number]  Feb 11 2010  ? OHS ALLERGIC RHINITIS (ICD-477.9) HYPERTENSION (ICD-401.9) UNSPECIFIED SENILE CATARACT (ICD-366.10) HYPERLIPIDEMIA (ICD-272.4) XEROSTOMIA (ICD-527.7) DYSPHAGIA UNSPECIFIED (ICD-787.20) COUGH (ICD-786.2) OBSTRUCTIVE SLEEP APNEA (ICD-327.23) DIVERTICULOSIS, COLON (ICD-562.10) DYSPNEA (ICD-786.05) PERSONAL HISTORY OF COLONIC POLYPS (ICD-V12.72)    Social History: Marital Status: Widowed. 2002 (married 1955). Originally from [place], [place]. Pt lives in a single level home at [employer] ([place] since 09/2008. Moved from [place], [place] Children: yes 4 Occupation: retired Patient states former smoker.  Quit 1984 1/2ppd Packs/Day:  0.5 Alcohol drinks/day:  <1  Review of Systems       The patient complains of shortness of breath with activity, shortness of breath at rest, productive cough, acid heartburn, sore throat, sneezing, and hand/feet swelling.  The patient denies non-productive cough, coughing up blood, chest pain, irregular heartbeats, indigestion, loss of appetite, weight change, abdominal pain, difficulty swallowing, tooth/dental problems, headaches, nasal congestion/difficulty breathing through nose, itching, ear ache, anxiety, depression, joint stiffness or pain, rash, change in color of mucus, and fever.    Vital Signs:  Patient profile:   77 year old female Height:      64 inches Weight:      224 pounds BMI:     38.59 O2 Sat:  96 % on Room air Temp:     97.8 degrees F oral Pulse rate:   84 / minute BP sitting:   130 / 82  (left arm) Cuff size:   regular  Vitals Entered By: Zackery Barefoot CMA (Feb 11, 2010 1:53 PM)  O2 Flow:  Room air CC: Pt here for pulmonary consult. Pt c/o SOB intermittent x 10 years after having "tummy tuck" worse with exertion Comments Medications  reviewed with patient Verified contact number and pharmacy with patient Zackery Barefoot CMA  Feb 11, 2010 1:53 PM    Physical Exam  Additional Exam:  amb obese wf nad wt 224 Feb 11, 2010 HEENT: nl dentition, turbinates, and orophanx. Nl external ear canals without cough reflex NECK :  without JVD/Nodes/TM/ nl carotid upstrokes bilaterally LUNGS: no acc muscle use, clear to A and P bilaterally without cough on insp or exp maneuvers CV:  RRR  no s3 or murmur or increase in P2, no edema   ABD:  soft and nontender with nl excursion in the supine position. No bruits or organomegaly, bowel sounds nl MS:  warm without deformities, calf tenderness, cyanosis or clubbing SKIN: warm and dry without lesions   NEURO:  alert, approp, no deficits     Sodium                    143 mEq/L                   135-145   Potassium            [L]  3.4 mEq/L                   3.5-5.1   Chloride                  99 mEq/L                    96-112   Carbon Dioxide       [H]  36 mEq/L                    19-32   Glucose              [H]  115 mg/dL                   16-10   BUN                       14 mg/dL                    9-60   Creatinine                0.8 mg/dL                   4.5-4.0   Calcium                   8.4 mg/dL                   9.8-11.9   GFR                       72.02 mL/min                >60  Tests: (2) CBC Platelet w/Diff (CBCD)   White Cell Count  7.0 K/uL                    4.5-10.5   Red Cell Count            3.87 Mil/uL                 3.87-5.11   Hemoglobin                12.8 g/dL                   86.5-78.4   Hematocrit                37.0 %                      36.0-46.0   MCV                       95.6 fl                     78.0-100.0   MCHC                      34.5 g/dL                   69.6-29.5   RDW                  [H]  15.0 %                      11.5-14.6   Platelet Count       [L]  142.0 K/uL                  150.0-400.0   Neutrophil %              58.1  %                      43.0-77.0   Lymphocyte %              30.8 %                      12.0-46.0   Monocyte %                7.4 %                       3.0-12.0   Eosinophils%              3.3 %                       0.0-5.0   Basophils %               0.4 %                       0.0-3.0   Neutrophill Absolute      4.1 K/uL                    1.4-7.7   Lymphocyte Absolute       2.2 K/uL                    0.7-4.0   Monocyte Absolute         0.5 K/uL  0.1-1.0  Eosinophils, Absolute                             0.2 K/uL                    0.0-0.7   Basophils Absolute        0.0 K/uL                    0.0-0.1  Tests: (3) Hepatic/Liver Function Panel (HEPATIC)   Total Bilirubin           0.7 mg/dL                   9.8-1.1   Direct Bilirubin          0.3 mg/dL                   9.1-4.7   Alkaline Phosphatase [H]  127 U/L                     39-117   AST                  [H]  50 U/L                      0-37   ALT                  [H]  45 U/L                      0-35   Total Protein             6.3 g/dL                    8.2-9.5   Albumin              [L]  3.2 g/dL                    6.2-1.3  Tests: (4) TSH (TSH)   FastTSH                   2.02 uIU/mL                 0.35-5.50  Tests: (5) B-Type Natiuretic Peptide (BNPR)  B-Type Natriuetic Peptide                        [H]  178.2 pg/mL                 0.0-100.0  CXR  Procedure date:  02/11/2010  Findings:      Findings: Elevation of the right hemidiaphragm with interposition of the hepatic flexure of the colon between the liver in the right hemidiaphragm.  Atelectasis and/or scarring in the right lower lobe and right middle lobe as a result.  Lungs otherwise clear. Cardiomediastinal silhouette unremarkable for age, with only mild thoracic aortic atherosclerosis.  No pleural effusions.  Visualized bony thorax intact.   IMPRESSION: Elevation of the right hemidiaphragm with associated linear atelectasis  and/or scarring in the right lower lobe and right middle lobe.  No acute cardiopulmonary disease otherwise.    Impression & Recommendations:  Problem # 1:  COUGH (ICD-786.2) The most common causes of chronic cough in immunocompetent adults include: upper airway cough syndrome (UACS), previously referred to  as postnasal drip syndrome,  caused by variety of rhinosinus conditions; (2) asthma; (3) GERD; (4) chronic bronchitis from cigarette smoking or other inhaled environmental irritants; (5) nonasthmatic eosinophilic bronchitis; and (6) bronchiectasis. These conditions, singly or in combination, have accounted for up to 94% of the causes of chronic cough in prospective studies.   Classic Upper airway cough syndrome, so named because it's frequently impossible to sort out how much is  CR/sinusitis with freq throat clearing (which can be related to primary GERD)   vs  causing  secondary extra esophageal GERD from wide swings in gastric pressure that occur with throat clearing, promoting self use of mint and menthol lozenges that reduce the lower esophageal sphincter tone and exacerbate the problem further These are the same pts who not infrequently have failed to tolerate ace inhibitors,  dry powder inhalers or biphosphonates or report having reflux symptoms that don't respond to standard doses of PPI  For now try rx with ppi and diet  NB the  ramp to expected improvement (and for that matter, worsening, if a chronic effective medication is stopped)  can be measured in weeks, not days, a common misconception because this is not Heartburn with no immediate cause and effect relationship so that response to therapy or lack thereof can be very difficult to assess.   Problem # 2:  HYPERTENSION (ICD-401.9)  The following medications were removed from the medication list:    Hydrochlorothiazide 25 Mg Tabs (Hydrochlorothiazide) .Marland Kitchen... Take 1 tablet by mouth once a day Her updated medication list for this  problem includes:    Maxzide 75-50 Mg Tabs (Triamterene-hctz) ..... One daily  BNP minimally elevated but does have tendency to leg swelling so rec change hctz to maxzide   Problem # 3:  ABNORMAL LUNG XRAY (ICD-793.1) changes appear chronic as do symptoms so no further w/u anticipated  Problem # 4:  DYSPNEA (ICD-786.05) HC03 36 so likely has component of obesity hypoventilation, doubt sign copd  Check pft's then ? refer for rehab  Medications Added to Medication List This Visit: 1)  Hydrochlorothiazide 25 Mg Tabs (Hydrochlorothiazide) .... Take 1 tablet by mouth once a day 2)  Klor-con M20 20 Meq Cr-tabs (Potassium chloride crys cr) .... Take 1 tablet by mouth once a day 3)  Niaspan 1000 Mg Cr-tabs (Niacin (antihyperlipidemic)) .... Take 1 tablet by mouth two times a day 4)  Xyzal 5 Mg Tabs (Levocetirizine dihydrochloride) .... Take 1 tablet by mouth once a day 5)  Aspirin 81 Mg Tabs (Aspirin) .... Take 1 tablet by mouth once a day 6)  Maxzide 75-50 Mg Tabs (Triamterene-hctz) .... One daily 7)  Nexium 40 Mg Cpdr (Esomeprazole magnesium) .... Take  one 30-60 min before first meal of the day 8)  Pepcid 20 Mg Tabs (Famotidine) 9)  Chlor-trimeton 4 Mg Tabs (Chlorpheniramine maleate) .... One every 6 hours as needed for drainage  Other Orders: T-Allergy Profile Region II-DC, [place], [place], [place], [place] (5484) T-2 View CXR (71020TC) New Patient Level V [phone]) TLB-BMP (Basic Metabolic Panel-BMET) (80048-METABOL) TLB-CBC Platelet - w/Differential (85025-CBCD) TLB-Hepatic/Liver Function Pnl (80076-HEPATIC) TLB-TSH (Thyroid Stimulating Hormone) (84443-TSH) TLB-BNP (B-Natriuretic Peptide) (83880-BNPR) Prescription Created Electronically [phone])  Patient Instructions: 1)  Stop hctz and potassium 2)  Start Maxzide 75 one daily 3)  start nexium 40 mg Take  one 30-60 min before first meal of the day and pepcid 20 mg one at bedtime 4)  if drainage use chlortrimeton 4 mg one at bedtime  5)  GERD (REFLUX)  is a common cause of respiratory symptoms. It commonly presents without heartburn and can be treated with medication, but also with lifestyle changes including avoidance of late meals, excessive alcohol, smoking cessation, and avoid fatty foods, chocolate, peppermint, colas, red wine, and acidic juices such as orange juice. NO MINT OR MENTHOL PRODUCTS SO NO COUGH DROPS  6)  USE SUGARLESS CANDY INSTEAD (jolley ranchers)  7)  NO OIL BASED VITAMINS  8)  Please schedule a follow-up appointment in 4weeks, sooner if needed with PFT's on return Prescriptions: NEXIUM 40 MG  CPDR (ESOMEPRAZOLE MAGNESIUM) Take  one 30-60 min before first meal of the day  #30 x 2   Entered and Authorized by:   Nyoka Cowden MD   Signed by:   Nyoka Cowden MD on 02/11/2010   Method used:   Electronically to        [facility]. [phone]* (retail)       [phone]       [place], [place]  [address]       Ph: [phone]       Fax: [phone]   RxID:   [id number] MAXZIDE 75-50 MG TABS (TRIAMTERENE-HCTZ) one daily  #30 x 11   Entered and Authorized by:   Nyoka Cowden MD   Signed by:   Nyoka Cowden MD on 02/11/2010   Method used:   Electronically to        [facility]. [phone]* (retail)       [phone]       [place], [place]  [address]       Ph: [phone]       Fax: [phone]   RxID:   [id number]    Immunization History:  Influenza Immunization History:    Influenza:  historical (07/06/2009)    CXR  Procedure date:  02/11/2010  Findings:      Findings: Elevation of the right hemidiaphragm with interposition of the hepatic flexure of the colon between the liver in the right hemidiaphragm.  Atelectasis and/or scarring in the right lower lobe and right middle lobe as a result.  Lungs otherwise clear. Cardiomediastinal silhouette unremarkable for age, with only mild thoracic aortic atherosclerosis.  No  pleural effusions.  Visualized bony thorax intact.   IMPRESSION: Elevation of the right hemidiaphragm with associated linear atelectasis and/or scarring in the right lower lobe and right middle lobe.  No acute cardiopulmonary disease otherwise.

## 2010-11-02 NOTE — Assessment & Plan Note (Signed)
Summary: Pulmonary/ final f/u    Copy to:  pcp Primary Provider/Referring Provider:  Dr. Murray Hodgkins  CC:  weak and sob.  History of Present Illness: 38 yowf quit smoking 1984 with complications from Tummy tuck in [place] 2001.  Feb 11, 2010  variable doe x 2001 better after singing  worse x 6 month > best days can do  grocery shopping but not mall due to legs give out assoc with cough and sensation of pnd and cough to point of vomiting  cough is worse when lie down  but breathing ok,  assoc wih variable leg swelling. imp was uacs and ? diast chf  rec: Stop hctz and potassium Start Maxzide 75 one daily start nexium 40 mg Take  one 30-60 min before first meal of the day and pepcid 20 mg one at bedtime if drainage use chlortrimeton 4 mg one at bedtime  March 08, 2010  cc  fatigued and states "no energy at all".  She states that her cough is the same- "not bad".   cough is much better.   sleeping ok flat.  can't walk 50 feet without symptoms of weak and sob. leg swelling gone.  no orthostatic lightheadedness.  > walked 185 ft in office with legs giving out, no desat, check CT chest neg> rec reconditioning and f/u cxr  March 29, 2010 ov slt better with regular walking Pt denies any significant sore throat, dysphagia, itching, sneezing,  nasal congestion or excess secretions,  fever, chills, sweats, unintended wt loss, pleuritic or exertional cp, hempoptysis, change in activity tolerance  orthopnea pnd or leg swelling Pt also denies any obvious fluctuation in symptoms with weather or environmental change or other alleviating or aggravating factors.       Current Medications (verified): 1)  Niaspan 1000 Mg Cr-Tabs (Niacin (Antihyperlipidemic)) .... Take 1 Tablet By Mouth Two Times A Day 2)  Xyzal 5 Mg Tabs (Levocetirizine Dihydrochloride) .... Take 1 Tablet By Mouth Once A Day 3)  Aspirin 81 Mg  Tabs (Aspirin) .... Take 1 Tablet By Mouth Once A Day 4)  Maxzide 75-50 Mg Tabs (Triamterene-Hctz)  .... One Daily  Allergies (verified): 1)  ! Morphine 2)  ! Toviaz (Fesoterodine Fumarate)  Past History:  Past Medical History: URINARY INCONTINENCE (ICD-788.30) KNEE JOINT REPLACEMENT BY OTHER MEANS (ICD-V43.65) OSTEOARTHRITIS (ICD-715.90) C O P D (ICD-496)  pft's rec...................................Marland KitchenSherene Sires    - PFT's March 29 2010 >> FEV1 1.34 (72) with ratio 75 and ERV 42, DLC0 55 > corrects to 137 OBESITY     - HC03 36  Feb 11 2010  ? OHS ALLERGIC RHINITIS (ICD-477.9) HYPERTENSION (ICD-401.9) UNSPECIFIED SENILE CATARACT (ICD-366.10) HYPERLIPIDEMIA (ICD-272.4) XEROSTOMIA (ICD-527.7) DYSPHAGIA UNSPECIFIED (ICD-787.20) COUGH (ICD-786.2)      - Better on gerd rx March 08, 2010      - Sinus ct March 08, 2010 >> OBSTRUCTIVE SLEEP APNEA (ICD-327.23) DIVERTICULOSIS, COLON (ICD-562.10) DYSPNEA (ICD-786.05)     - No desat walking March 08, 2010      - CT chest March 09 2010 >>> nl lung but abn liver > deferred w/u to Dr Chilton Si PERSONAL HISTORY OF COLONIC POLYPS (ICD-V12.72)    Vital Signs:  Patient profile:   77 year old female Weight:      211 pounds O2 Sat:      93 % on Room air Temp:     97.5 degrees F oral Pulse rate:   90 / minute BP sitting:   124 /  72  (left arm) Cuff size:   large  Vitals Entered By: Vernie Murders (March 29, 2010 10:19 AM)  O2 Flow:  Room air CC: weak and sob Comments Ambulatory Pulse Oximetry  Resting; HR_75____    02 Sat_96____  Lap1 (185 feet)   HR_120____   02 Sat_93%RA____ Lap2 (185 feet)   HR__121___   02 Sat__93%RA___    Lap3 (185 feet)   HR__127___   02 Sat__93%RA___  _X__Test Completed without Difficulty ___Test Stopped due to:     Physical Exam  Additional Exam:  amb obese wf nad somewhat depressed appearing no longer hoarse  wt 224 Feb 11, 2010 > 211 March 08, 2010 > 211 March 29, 2010  HEENT: nl dentition, turbinates, and orophanx. Nl external ear canals without cough reflex NECK :  without JVD/Nodes/TM/ nl carotid upstrokes  bilaterally LUNGS: no acc muscle use, clear to A and P bilaterally without cough on insp or exp maneuvers CV:  RRR  no s3 or murmur or increase in P2, no edema now ABD:  soft and nontender with nl excursion in the supine position. No bruits or organomegaly, bowel sounds nl MS:  warm without deformities, calf tenderness, cyanosis or clubbing      Impression & Recommendations:  Problem # 1:  C O P D (ICD-496) Very minimal changes on FV loop and in mid exp flows so do not believe she has copd.  Disproportionate changes in ERV and "legs giving out" are much more c/w obesity/ deconditioning.  Weight control is a matter of calorie balance which needs to be tilted in the pt's favor by eating less and exercising more.  Specifically, I recommended  exercise at a level where pt  is short of breath but not out of breath 30 minutes daily.  If not losing weight on this program, I would strongly recommend pt see a nutritionist with a food diary recorded for two weeks prior to the visit.     Patient Instructions: 1)  Weight control is simply a matter of calorie balance which needs to be tilted in your favor by eating less and exercising more.  To get the most out of exercise, you need to be continuously aware that you are short of breath, but never out of breath, for 30 minutes daily. As you improve, it will actually be easier for you to do the same amount in  30 minutes so always push to the level where you are short of breath.  If this does not result in gradual weight reduction,  I recommend  a nutritionist for a food diary 2)  Pulmonary follow up can be used as needed 3)  LATE ADD:  Abn ct of liver > defer to DR GREEN (faxed copy as follows: 4)  Left hepatic lobe atrophy with areas of markedly decreased 5)  attenuation in the residual liver.  While this may represent a non 6)  typical fat deposition, further evaluation with MR abdomen with and 7)  without contrast is recommended, as malignancy cannot  be excluded.   Appended Document: Orders Update    Clinical Lists Changes  Orders: Added new Service order of Est. Patient Level III (02542) - Signed Added new Service order of Pulse Oximetry, Ambulatory (70623) - Signed

## 2010-11-02 NOTE — Letter (Signed)
Summary: Problems List/[facility]  Problems List/[facility]   Imported By: Sherian Rein 02/17/2010 08:21:51  _____________________________________________________________________  External Attachment:    Type:   Image     Comment:   External Document

## 2010-11-02 NOTE — Progress Notes (Signed)
Summary: Barium Enema Scheduled  Phone Note Other Incoming   Caller: Celia RN in [facility] Recovery Summary of Call: Pt. had an incomplete Colonoscopy, needs a Ba Enema to complete exam. She is scheduled at [facility] on 12-16-09 at 9:30am. She will remain on clear liquids and be NPO after 12mn.  Above appt/instructions given to [facility], she will review w/pt. responsible party.  Initial call taken by: Laureen Ochs LPN,  December 15, 2009 4:27 PM  New Problems: PERSONAL HISTORY OF COLONIC POLYPS (ICD-V12.72)   New Problems: PERSONAL HISTORY OF COLONIC POLYPS (ICD-V12.72)

## 2010-11-02 NOTE — Letter (Signed)
Summary: [facility]   Imported By: Sherian Rein 02/17/2010 08:20:54  _____________________________________________________________________  External Attachment:    Type:   Image     Comment:   External Document

## 2010-11-02 NOTE — Procedures (Signed)
Summary: Colonoscopy  Patient: [name] Note: All result statuses are Final unless otherwise noted.  Tests: (1) Colonoscopy (COL)   COL Colonoscopy           DONE     [facility]     [address]     [place], [place]  [address]           COLONOSCOPY PROCEDURE REPORT           PATIENT:  Cindy, Cervantes  MR#:  [id number]     BIRTHDATE:  Dec 25, 1931, 77 yrs. old  GENDER:  female           ENDOSCOPIST:  Hedwig Morton. Juanda Chance, MD     Referred by:  Murray Hodgkins, M.D.           PROCEDURE DATE:  12/15/2009     PROCEDURE:  Incomplete colonoscopy     ASA CLASS:  Class II     INDICATIONS:  hx of colon polyps, no records, last colonoscopy     about 5 years ago was incomplete due to " tortuous colon"           MEDICATIONS:   Versed 11 mg, Fentanyl 100 mcg           DESCRIPTION OF PROCEDURE:   After the risks benefits and     alternatives of the procedure were thoroughly explained, informed     consent was obtained.  Digital rectal exam was performed and     revealed no rectal masses.   The LB [id number] endoscope     was introduced through the anus and advanced to the distal     transverse colon, without limitations.  The quality of the prep     was good, using MiraLax.  The instrument was then slowly withdrawn     as the colon was fully examined.     <<PROCEDUREIMAGES>>           FINDINGS:  incomplete exam. unable to advance via transverse     colon, scope advanced to 100 cm, redundant colon, abdominal     pressure and change of position did not help  There were mild     diverticular changes in left colon. diverticulosis was found (see     image1, image2, and image3).   Retroflexed views in the rectum     revealed no abnormalities.    The scope was then withdrawn from     the patient and the procedure completed.           COMPLICATIONS:  None           ENDOSCOPIC IMPRESSION:     1) Incomplete exam     2) Diverticulosis,mild,left sided diverticulosis     exam to  transverse colon ( 100 cm), redundant colon     RECOMMENDATIONS:     Barium enema to vis the remaining colon and to assess tortuosity                 REPEAT EXAM:  In 0 year(s) for.  consider virtual colonoscopy in     7-10 years, may not be necessary due to age           ______________________________     Hedwig Morton. Juanda Chance, MD           CC:           n.     eSIGNED:   Hedwig Morton. Braelin Costlow at 12/15/2009 04:10 PM  Steffani, Dionisio, [id number]  Note: An exclamation mark (!) indicates a result that was not dispersed into the flowsheet. Document Creation Date: 12/15/2009 4:11 PM _______________________________________________________________________  (1) Order result status: Final Collection or observation date-time: 12/15/2009 15:57 Requested date-time:  Receipt date-time:  Reported date-time:  Referring Physician:   Ordering Physician: Lina Sar [phone]) Specimen Source:  Source: Launa Grill Order Number: [id number] Lab site:   Appended Document: Colonoscopy    Clinical Lists Changes  Observations: Added new observation of COLONNXTDUE: 12/2016 (12/16/2009 8:51)

## 2010-11-02 NOTE — Miscellaneous (Signed)
Summary: [facility]   Imported By: Lester [place] 06/23/2010 08:18:00  _____________________________________________________________________  External Attachment:    Type:   Image     Comment:   External Document

## 2010-11-03 ENCOUNTER — Ambulatory Visit: Admit: 2010-11-03 | Payer: Self-pay

## 2010-11-03 LAB — FOLATE RBC: RBC Folate: 829 ng/mL — ABNORMAL HIGH (ref 180–600)

## 2010-11-04 NOTE — Initial Assessments (Signed)
Summary: chest pressure: CHF   Vital Signs:  Patient profile:   77 year old female O2 Sat:      98 % on 2 L/min Temp:     98.1 degrees F Pulse rate:   62 / minute Resp:     19 per minute BP supine:   118 / 58  O2 Flow:  2 L/min  Primary Care Provider:  Dr. Murray Hodgkins  CC:  chest pressure.  History of Present Illness: 77 y/o F sho developed a feeling of a band around her chest last night at midnight (about 11 hours ago). She said it was difficult to take a deep breath. She got up and moved to the recliner to try to rest. At 6:00 am she called the nurse and told her about her chest pressure. She noted no chest pain, no radiation, no recent illness or surgeries. Pt did not take anything until the EMS arrived when she took 4 ASA. She was recently hospitalized for a.fib with RVR and is on coumadin therapy. She had a recent ECHO done 09-08-10 that showed an EF of 65-70% with trivial TR/MR. She has not had a cath done in this hospital but had one done many years ago when she had her tummy tuck. She reports that it was normal.   Allergies: 1)  ! Morphine 2)  ! Toviaz (Fesoterodine Fumarate)  Past History:  Past Medical History: Reviewed history from 03/29/2010 and no changes required. URINARY INCONTINENCE (ICD-788.30) KNEE JOINT REPLACEMENT BY OTHER MEANS (ICD-V43.65) OSTEOARTHRITIS (ICD-715.90) C O P D (ICD-496)  pft's rec...................................Marland KitchenSherene Sires    - PFT's March 29 2010 >> FEV1 1.34 (72) with ratio 75 and ERV 42, DLC0 55 > corrects to 137 OBESITY     - HC03 36  Feb 11 2010  ? OHS ALLERGIC RHINITIS (ICD-477.9) HYPERTENSION (ICD-401.9) UNSPECIFIED SENILE CATARACT (ICD-366.10) HYPERLIPIDEMIA (ICD-272.4) XEROSTOMIA (ICD-527.7) DYSPHAGIA UNSPECIFIED (ICD-787.20) COUGH (ICD-786.2)      - Better on gerd rx March 08, 2010      - Sinus ct March 08, 2010 >> OBSTRUCTIVE SLEEP APNEA (ICD-327.23) DIVERTICULOSIS, COLON (ICD-562.10) DYSPNEA (ICD-786.05)     - No desat walking  March 08, 2010      - CT chest March 09 2010 >>> nl lung but abn liver > deferred w/u to Dr Chilton Si PERSONAL HISTORY OF COLONIC POLYPS (ICD-V12.72)    Past Surgical History: Reviewed history from 02/10/2010 and no changes required. 1964-Tubal ligation 1999-Right TKR 2000-Tummy Tuck: "almost died"; was in respiratory distress. Became delirious 2003-Left TKR 2008-Cataract Extraction   Ortho: [facility] Ophth:[facility] Derm: [facility] Dentist: [facility]  Family History: Reviewed history from 02/10/2010 and no changes required. Father deceased age 23 heart disease. Mother deceased age 91 natural causes  Social History: Reviewed history from 02/11/2010 and no changes required. Marital Status: Widowed. 2002 (married 1955). Originally from [place], [place]. Pt lives in a single level home at [employer] [facility] ([place] since 09/2008. Moved from [place], [place] Children: yes 4 Occupation: retired Patient states former smoker.  Quit 1984 1/2ppd  Physical Exam  General:  Well-developed,well-nourished,in no acute distress; alert,appropriate and cooperative throughout examination Head:  Normocephalic and atraumatic without obvious abnormalities. No apparent alopecia or balding. JVD difficult to evaluate. Eyes:  No corneal or conjunctival inflammation noted. EOMI. Perrla. Vision grossly normal. Ears:  External ear exam shows no significant lesions or deformities.  Otoscopic examination reveals clear canals, tympanic membranes are intact bilaterally without bulging, retraction, inflammation or discharge. Hearing is grossly  normal bilaterally. Nose:  External nasal examination shows no deformity or inflammation. Nasal canula in place Mouth:  Oral mucosa and oropharynx without lesions or exudates.  Teeth in fair repair. Mucus membranes appear dry Lungs:  diffuse mild crackles more in lung bases, slightly shallow resp effort.  Heart:  Normal rate and regular rhythm. S1 and S2 normal without gallop,  murmur, click, rub or other extra sounds. Abdomen:  abdomen soft and non-tender without masses, organomegaly or hernias noted. Msk:  moving all limbs symmetrically Extremities:  bruising from recent IV placement attempts  Psych:  Cognition and judgment appear intact. Alert and cooperative with normal attention span and concentration. No apparent delusions, illusions, hallucinations    Labs: POC CE neg BNP 220 INR 2.33 UA: amber, cloudy, small bili, small LE Umicro: few epith, 7-10 WBC's, some mucus, rare bacteria CBC: 8.6>10.4/32<267 CMP:   Sodium (NA)                               137               135-145          mEq/L  Potassium (K)                            3.7               3.5-5.1          mEq/L  Chloride                                 98                96-112           mEq/L  CO2                                      29                19-32            mEq/L  Glucose                                  114        h      70-99            mg/dL  BUN                                      18                6-23             mg/dL  Creatinine                               0.85              0.4-1.2          mg/dL  GFR, Est Non African American   >60               >  60              mL/min  GFR, Est African American       >60               >60              mL/min  Bilirubin, Total                          0.6               0.3-1.2          mg/dL  Alkaline Phosphatase                      303        h      39-117           U/L  SGOT (AST)                                65         h      0-37             U/L  SGPT (ALT)                                52         h      0-35             U/L  Total  Protein                            6.4               6.0-8.3          g/dL  Albumin-Blood                             2.7        l      3.5-5.2          g/dL  Calcium                                   8.9               8.4-10.5         mg/dL   Studies: CXR: Question of mild pulm vascular  congestion, chronic Rt hemidiaphram elevation EKG: NSR, no q waves, no ST elevation.    Impression & Recommendations:  Problem # 1:  Dyspnea/chest pressure Pt does appear mildy volume overloaded with crackels on lungs and JVP 8-9. Suspect diastolic CHF. Wonder if the trigger for this episode could have been a. fib. She has been in NSR in the ER.   Plan: Lasix 40 mg IV two times a day Follow on tele for a.fib  Problem # 2:  ATRIAL FIBRILLATION (ICD-427.31) Paroxysmal, On pindolol b/c of periodic junctional bradycardic episodes noted on tele last admission. She is on coumadin. I wonder if this episode w/ PAF triggered current presentation.  Plan: Monitor on telel for a. fib If A. fib occurs, consider class 1c agent vs  amiodarone to suppress a. fib (if we think her HR can tolerate it) Will monitor closely for any further bradycardia.   Her updated medication list for this problem includes:    Aspirin 81 Mg Tabs (Aspirin) .Marland Kitchen... Take 1 tablet by mouth once a day    Pindolol 5 Mg Tabs (Pindolol) .Marland Kitchen... 1 by mouth bid    Warfarin Sodium 4 Mg Tabs (Warfarin sodium) .Marland Kitchen... Take 1 by mouth at bedtime  Problem # 3:  Chest pressure Plan to r/o MI with CE x 3 q8 hrs (first one now). No acute seen on EKG.   Complete Medication List: 1)  Xyzal 5 Mg Tabs (Levocetirizine dihydrochloride) .... Take 1 tablet by mouth once a day 2)  Aspirin 81 Mg Tabs (Aspirin) .... Take 1 tablet by mouth once a day 3)  Pindolol 5 Mg Tabs (Pindolol) .Marland Kitchen.. 1 by mouth bid 4)  Triamterene-hctz 37.5-25 Mg Tabs (Triamterene-hctz) .Marland Kitchen.. 1 tab by mouth daily 5)  Warfarin Sodium 4 Mg Tabs (Warfarin sodium) .... Take 1 by mouth at bedtime 6)  Niaspan 500 Mg Cr-tabs (Niacin (antihyperlipidemic)) .... Take 1 tab by mouth daily 7)  Magnesium Oxide  .Marland Kitchen.. 1 po daily 8)  Pepcid 20 Mg Tabs (Famotidine) .Marland Kitchen.. 1 tab by mouth at bedtime 9)  Prilosec Otc 20 Mg Tbec (Omeprazole magnesium) .Marland Kitchen.. 1 cap by mouth daily 10)  Vit D 3  .Marland Kitchen.. 1 tablet  by mouth daily 11)  Sertraline Hcl 25 Mg Tabs (Sertraline hcl) .... Take 1 tab by mouth daily       Pt seen by Dr. Jamie Brookes, FM resident, discussed with and evaluated by Dr. Shirlee Latch. pg [phone]

## 2010-11-04 NOTE — Medication Information (Signed)
Summary: Coumadin Clinic  Anticoagulant Therapy  Managed by: Inactive Referring MD: Graciela Husbands PCP: Dr. Murray Hodgkins Supervising MD: Ladona Ridgel MD, Sharlot Gowda Indication 1: Atrial Fibrillation Lab Used: [facility] Site: [address] PT 20.2 INR POC 1.71 INR RANGE 2.0-3.0     Recent/future hospitalizations: yes       Details: pending pacemaker placement on 1/19      Comments: Pt reside at [facility]  and nurse called on 10/19/10 and was concerned about dose since pt has device placement on 10/21/10. She states INR today (10/19/10) is 2.14. On 10/15/10 it was 1.71 and there doctor, Dr Neva Seat dosed that result and he has been dosing all her INR's but since pt is having a procedure on 19th they want [place] to dose 2.14. Thus, per Pharm D instructed Talbert Forest to give pt 4mg s today, 17th and 2mg  on 18th and follow post-procedure instructions and their doctor can resume responsiblity for dosing after procedure. Bethena Midget, RN, BSN  October 20, 2010 9:06 AM   Allergies: 1)  ! Morphine 2)  ! Toviaz (Fesoterodine Fumarate)  Anticoagulation Management History:      Her anticoagulation is being managed by telephone today.  Positive risk factors for bleeding include an age of 20 years or older.  The bleeding index is 'intermediate risk'.  Positive CHADS2 values include History of HTN and Age > 71 years old.  Prothrombin time is 20.2.  Anticoagulation responsible provider: Ladona Ridgel MD, Sharlot Gowda.  INR POC: 1.71.    Anticoagulation Management Assessment/Plan:      The patient's current anticoagulation dose is Warfarin sodium 4 mg tabs: take 1 by mouth at bedtime.  The target INR is 2.0-3.0.  The next INR is due 09/22/2010.  Anticoagulation instructions were given to patient.  Results were reviewed/authorized by Inactive.  She was notified by Bethena Midget, RN, BSN.         Prior Anticoagulation Instructions: INR 2.5  Continue same dose of 1 tablet every day.  Recheck INR in 1  week.   Current Anticoagulation Instructions: INR 1.71  LMOM for pt. Weston Brass PharmD  October 18, 2010 8:53 AM  [facility] for pt. Cloyde Reams RN  October 18, 2010 5:10 PM  [facility] for pt. Weston Brass PharmD  October 19, 2010 1:59 PM   Give pt 4mg s today, 2mg s on 18th and Dr Neva Seat can resume responsibility after procedure.  Bethena Midget, RN, BSN  October 19, 2010 5:21 PM

## 2010-11-04 NOTE — Assessment & Plan Note (Signed)
Summary: 1wk f/u @ 2pm ok per ronda to dicuss pacermaker/sl   Referring Provider:  pcp Primary Provider:  Dr. Murray Hodgkins   History of Present Illness: Primary Electrophysiologist:  Dr. Ronnald Nian is in followup for atrial fibrillation with rapid ventricular response for which she has been hospitalized a couple of occasions in December. Unfortunately she had significant bradycardia even on ISA beta blockers and these were discontinued last week. She has felt somewhat better since then. the sleepiness that the family had noted is somewhat improved also.  She is unaware of her atrial fibrillation.      Of note in the emergency room on December 26 her potassium is 2.9, creatinine 1.44, hemoglobin 9.7.  Her EKG reportedly showed sinus bradycardia with a heart rate of 53.  She returns for followup.   Repeat labs demonstrated normal potassium earlier this week as well as improving LFT abnormality  Current Medications (verified): 1)  Warfarin Sodium 4 Mg Tabs (Warfarin Sodium) .... Take 1 By Mouth At Bedtime 2)  Niaspan 500 Mg Cr-Tabs (Niacin (Antihyperlipidemic)) .... Take 1 Tab By Mouth Daily 3)  Magnesium Oxide .Marland Kitchen.. 1 Po Daily 4)  Pepcid 20 Mg Tabs (Famotidine) .Marland Kitchen.. 1 Tab By Mouth At Bedtime 5)  Prilosec Otc 20 Mg Tbec (Omeprazole Magnesium) .Marland Kitchen.. 1 Cap By Mouth Daily 6)  Sertraline Hcl 25 Mg Tabs (Sertraline Hcl) .... Take 1 Tab By Mouth Daily 7)  Flecainide Acetate 100 Mg Tabs (Flecainide Acetate) .... 1/2 Tab Two Times A Day  Allergies (verified): 1)  ! Morphine 2)  ! Toviaz (Fesoterodine Fumarate)  Past History:  Past Medical History: Last updated: 10/01/2010 Parox AFib with RVR   a.  Flecainide Rx started 09/2010   b.  Coumadin (CHADS2=2) h/o Hyperkalemia in setting of spironolactone 09/2010 URINARY INCONTINENCE (ICD-788.30) KNEE JOINT REPLACEMENT BY OTHER MEANS (ICD-V43.65) OSTEOARTHRITIS (ICD-715.90) C O P D (ICD-496)  pft's  rec...................................Marland KitchenSherene Sires    - PFT's March 29 2010 >> FEV1 1.34 (72) with ratio 75 and ERV 42, DLC0 55 > corrects to 137 OBESITY     - HC03 36  Feb 11 2010  ? OHS ALLERGIC RHINITIS (ICD-477.9) HYPERTENSION (ICD-401.9) UNSPECIFIED SENILE CATARACT (ICD-366.10) HYPERLIPIDEMIA (ICD-272.4) XEROSTOMIA (ICD-527.7) DYSPHAGIA UNSPECIFIED (ICD-787.20) COUGH (ICD-786.2)      - Better on gerd rx March 08, 2010      - Sinus ct March 08, 2010 >> OBSTRUCTIVE SLEEP APNEA (ICD-327.23) DIVERTICULOSIS, COLON (ICD-562.10) DYSPNEA (ICD-786.05)     - No desat walking March 08, 2010      - CT chest March 09 2010 >>> nl lung but abn liver > deferred w/u to Dr Chilton Si PERSONAL HISTORY OF COLONIC POLYPS (ICD-V12.72)    Vital Signs:  Patient profile:   77 year old female Height:      64 inches Weight:      196 pounds BMI:     33.76 Pulse rate:   70 / minute BP sitting:   154 / 91  (left arm) Cuff size:   large  Vitals Entered By: Burnett Kanaris, CNA (October 07, 2010 2:23 PM)  Physical Exam  General:  The patient was alert and oriented in no acute distress.Neck veins were flat, carotids were brisk. Lungs were clear. Heart sounds were irregular without murmurs or gallops. Abdomen was soft with active bowel sounds. There is no clubbing cyanosis; 2+ bilateral edema    Impression & Recommendations:  Problem # 1:  EDEMA (ICD-782.3) This likely  represents some degree of diastolic heart failure aggravated by the atrial fibrillation with a rapid rate;  because of the bradycardia we will have to hold off at this point on real rhythm control. We will resume her atenolol at 2.5 mg twice daily.  Will begin diuretic at 20 mg Lasix daily.  Problem # 2:  SICK SINUS SYNDROME (ICD-427.81) as above. We will need to undertake basic implantation for control of the bradycardia I will allow for medical therapy for rapid rate. In the in between I will increase her flecainide from 50-100 mg twice daily. Her  updated medication list for this problem includes:    Warfarin Sodium 4 Mg Tabs (Warfarin sodium) .Marland Kitchen... Take 1 by mouth at bedtime    Flecainide Acetate 100 Mg Tabs (Flecainide acetate) ..... One tab two times a day    Pindolol 5 Mg Tabs (Pindolol) .Marland Kitchen... 1/2 tablet daily  Problem # 3:  ATRIAL FIBRILLATION (ICD-427.31) as above Her updated medication list for this problem includes:    Warfarin Sodium 4 Mg Tabs (Warfarin sodium) .Marland Kitchen... Take 1 by mouth at bedtime    Flecainide Acetate 100 Mg Tabs (Flecainide acetate) ..... One tab two times a day    Pindolol 5 Mg Tabs (Pindolol) .Marland Kitchen... 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 It may be of some value to go back and see pulmonary in  Patient Instructions: 1)  Your physician has recommended you make the following change in your medication: Lasix 20mg  daily, Increase Flecainide to 100mg  twice a day. Pindolol 2.5mg  daily 2)  Your physician has recommended that you have a pacemaker inserted.  A pacemaker is a small device that is placed under the skin of your chest or abdomen to help control abnormal heart rhythms. This device uses electrical pulses to prompt the heart to beat at a normal rate. Pacemakers are used to treat heart rhythms that are too slow. Wires (leads) are attached to the pacemaker that goes into the chambers of your heart. This is done in the hospital and usually requires an overnight stay. Please see the instruction sheet given to you today for more information. Prescriptions: FUROSEMIDE 20 MG TABS (FUROSEMIDE) once daily  #30 x 11   Entered by:   Claris Gladden RN   Authorized by:   Nathen May, MD, [facility]   Signed by:   Claris Gladden RN on 10/07/2010   Method used:   Electronically to        [facility]. [phone]* (retail)       [phone]       [place], [place]  [address]       Ph: [phone]       Fax:  [phone]   RxID:   [id number]

## 2010-11-04 NOTE — Progress Notes (Signed)
Summary: sinus bradycardia with sob post hospital.   Phone Note From Other Clinic   Caller: Dr. Chilton Si from [facility] Call For: Dr. Riley Kill Summary of Call: See note of described call.  I spoke with the nurse at [facility].  Not sure of best option.  See our recommendations.  TS Initial call taken by: Ronaldo Miyamoto, MD, [facility]  September 30, 2010 2:53 PM  Follow-up for Phone Call        The RN @ [facility] is calling regarding Cindy Cervantes. She stated that Ms. Frankowski is SOB with sinus bradycardia on an EKG around 49-50. She was just recently admitted to the hospital with Afib RVR and CHF and started on Flecainide and  is also on Pindolol 5mg  two times a day. The patient is also very fatigued. Repeat chest xray shows atelectasis in the right lower lobe unchanged from the hospital. Dr. Riley Kill advised the patient to go back to the ER to be evaluated there or try cutting her Pindolol in half for the bradycardia. Since she is not @ the office, it is difficult to evaluate her. The RN was going to speak with Dr. Chilton Si and the patient about this & make the best decision for the patient. She is supposed to f/u with Tereso Newcomer on Friday 12/30.  Whitney Maeola Sarah RN  September 29, 2010 2:20 PM  Follow-up by: Whitney Maeola Sarah RN,  September 29, 2010 2:13 PM

## 2010-11-04 NOTE — Assessment & Plan Note (Signed)
Summary: eph.gd   Referring Provider:  pcp Primary Provider:  Dr. Murray Hodgkins   History of Present Illness: Primary Electrophysiologist:  Dr. Sherryl Manges  Cindy Cervantes is a 77 yo female who was admitted to [facility] December 6 with AFib with RVR.  She was treated with calcium channel blocker and beta blocker with conversion to normal sinus rhythm and development of sinus node dysfunction.  She was eventually switched to pindolol.  She was placed on Coumadin due to her high stroke risk.  She presented back to [facility] December 20 with recurrent AFib with RVR.  She also developed mild diastolic CHF.  She was placed on flecainide with conversion to NSR.  She developed hyperkalemia in the setting of spironolactone which was discontinued.  She was discharged on December 25 and went back to the emergency room on December 26 and was diagnosed with a urinary tract infection.    She returns for followup today.  Of note in the emergency room on December 26 her potassium is 2.9, creatinine 1.44, hemoglobin 9.7.  Her EKG reportedly showed sinus bradycardia with a heart rate of 53.  She returns for followup.  She continues to feel poorly.  Her daughter is with her today and noticed that she's felt sleepy for several months now.  She had an episode of near syncope yesterday when getting up to go the bathroom.  She denies any frank syncope.  She denies chest discomfort.  She does have some dysphagia and indigestion.  She also has gallstones.  Her recent evaluation with GI was cut short when she was found to be in atrial fibrillation with rapid ventricular rate.  She does describe some orthopnea.  She denies lower extremity edema.  She denies palpitations.  Current Medications (verified): 1)  Pindolol 5 Mg Tabs (Pindolol) .... 1/2 By Mouth Two Times A Day 2)  Warfarin Sodium 4 Mg Tabs (Warfarin Sodium) .... Take 1 By Mouth At Bedtime 3)  Niaspan 500 Mg Cr-Tabs (Niacin (Antihyperlipidemic))  .... Take 1 Tab By Mouth Daily 4)  Magnesium Oxide .Marland Kitchen.. 1 Po Daily 5)  Pepcid 20 Mg Tabs (Famotidine) .Marland Kitchen.. 1 Tab By Mouth At Bedtime 6)  Prilosec Otc 20 Mg Tbec (Omeprazole Magnesium) .Marland Kitchen.. 1 Cap By Mouth Daily 7)  Sertraline Hcl 25 Mg Tabs (Sertraline Hcl) .... Take 1 Tab By Mouth Daily 8)  Mucinex 600 Mg Xr12h-Tab (Guaifenesin) .Marland Kitchen.. 1 By Mouth Daily X 5 Days 9)  Acetaminophen 325 Mg  Tabs (Acetaminophen) .... As Needed 10)  Flecainide Acetate 100 Mg Tabs (Flecainide Acetate) .Marland Kitchen.. 1 By Mouth Two Times A Day 11)  Bactrim Ds 800-160 Mg Tabs (Sulfamethoxazole-Trimethoprim) .... Uad  Allergies (verified): 1)  ! Morphine 2)  ! Toviaz (Fesoterodine Fumarate)  Past History:  Past Medical History: Parox AFib with RVR   a.  Flecainide Rx started 09/2010   b.  Coumadin (CHADS2=2) h/o Hyperkalemia in setting of spironolactone 09/2010 URINARY INCONTINENCE (ICD-788.30) KNEE JOINT REPLACEMENT BY OTHER MEANS (ICD-V43.65) OSTEOARTHRITIS (ICD-715.90) C O P D (ICD-496)  pft's rec...................................Marland KitchenSherene Sires    - PFT's March 29 2010 >> FEV1 1.34 (72) with ratio 75 and ERV 42, DLC0 55 > corrects to 137 OBESITY     - HC03 36  Feb 11 2010  ? OHS ALLERGIC RHINITIS (ICD-477.9) HYPERTENSION (ICD-401.9) UNSPECIFIED SENILE CATARACT (ICD-366.10) HYPERLIPIDEMIA (ICD-272.4) XEROSTOMIA (ICD-527.7) DYSPHAGIA UNSPECIFIED (ICD-787.20) COUGH (ICD-786.2)      - Better on gerd rx March 08, 2010      -  Sinus ct March 08, 2010 >> OBSTRUCTIVE SLEEP APNEA (ICD-327.23) DIVERTICULOSIS, COLON (ICD-562.10) DYSPNEA (ICD-786.05)     - No desat walking March 08, 2010      - CT chest March 09 2010 >>> nl lung but abn liver > deferred w/u to Dr Chilton Si PERSONAL HISTORY OF COLONIC POLYPS (ICD-V12.72)    Review of Systems       As per  the HPI.  All other systems reviewed and negative.   Vital Signs:  Patient profile:   77 year old female Height:      64 inches Weight:      195 pounds BMI:     33.59 Pulse  rate:   47 / minute Resp:     18 per minute BP sitting:   122 / 68  (right arm) BP standing:   102 / 68  Vitals Entered By: Marrion Coy, CNA (October 01, 2010 9:38 AM)  Serial Vital Signs/Assessments:  Time      Position  BP       Pulse  Resp  Temp     By           Standing  102/68                         Tereso Newcomer PA-C   Physical Exam  General:  Well nourished, well developed, in no acute distress HEENT: normal Neck: no JVD at 90 degrees Cardiac: distant heart sounds; normal S1, S2; RRR; no murmur Lungs:  clear to auscultation bilaterally, no wheezing, rhonchi or rales Abd: soft, nontender, no hepatomegaly Ext: trace edema Skin: warm and dry Neuro:  CNs 2-12 intact, no focal abnormalities noted    EKG  Procedure date:  10/01/2010  Findings:      Increased artifact Sinus bradycardia Heart rate 47 Low-voltage Interventricular conduction the leg Correction . . . .Above should read "interventricular conduction delay"  Impression & Recommendations:  Problem # 1:  ATRIAL FIBRILLATION (ICD-427.31) Maintaining normal sinus rhythm.  However, her heart rate is now extremely low.  Please see discussion below.  She continues on Coumadin.  This is managed at the nursing home.  Orders: EKG w/ Interpretation (93000)  Problem # 2:  HYPERTENSION (ICD-401.9) Controlled.  She does have a drop in her pressure when she stands.  Please see the discussion below.  Problem # 3:  HYPERLIPIDEMIA (ICD-272.4) Her updated medication list for this problem includes:    Niaspan 500 Mg Cr-tabs (Niacin (antihyperlipidemic)) .Marland Kitchen... Take 1 tab by mouth daily  Problem # 4:  SICK SINUS SYNDROME (ICD-427.81) I discussed her case with Dr. Clifton James.  At this point, it seems that she has sick sinus syndrome.  She probably needs pacemaker implantation.  We will discontinue her pindolol.  Hopefully, this will help reduce her orthostasis.  I will also reduce her flecainide to 50 mg b.i.d.  We will  bring her back in close followup with Dr. Graciela Husbands to discuss further treatment.  Problem # 5:  Elevated LFTs She has cholelithiasis.  Her recent ALP was over 300.  She will likely need cholecystectomy at some point.  Her sick sinus syndrome will need to be addressed first.  Problem # 6:  Hyperkalemia We will make sure she has a bmet today to follow up.  Patient Instructions: 1)  Your physician recommends that you schedule a follow-up appointment in:  NEXT WEEK WITH DR. Graciela Husbands AS PER Mihaela Fajardo, PA, TO [place]  ABOUT A PACEMAKER. 2)  Your physician has recommended you make the following change in your medication: STOP YOUR PINDOLOL, ALSO DECREASE YOUR FLECAINIDE TO 100 MG TABLET TAKE 1/2 TABLET TWICE DAILY AS PER Jaqualyn Juday, PA. 3)  Your physician recommends that you return for lab work in: PLEASE HAVE NURSING HOME REPEAT A POTASSIUM LEVEL WITH YOUR NEXT BLOOD WORK THERE.

## 2010-11-04 NOTE — Letter (Signed)
Summary: [facility] Progress Note   [facility] Progress Note   Imported By: Roderic Ovens 10/21/2010 15:02:31  _____________________________________________________________________  External Attachment:    Type:   Image     Comment:   External Document

## 2010-11-04 NOTE — Medication Information (Signed)
Summary: ccn.gd  Anticoagulant Therapy  Managed by: Weston Brass, PharmD Referring MD: Graciela Husbands PCP: Dr. Murray Hodgkins Supervising MD: Shirlee Latch MD, Freida Busman Indication 1: Atrial Fibrillation Lab Used: [facility] [place] Site: [address] INR POC 2.5 INR RANGE 2.0-3.0  Dietary changes: no    Health status changes: no    Bleeding/hemorrhagic complications: no    Recent/future hospitalizations: yes       Details: Pt discharged on 12/11 with new afib.  Started on Coumadin on 12/7  Any changes in medication regimen? no    Recent/future dental: no  Any missed doses?: no       Is patient compliant with meds? yes      Comments: Pt and daughter educated on bleeding risks, dietary concerns, and medication interactions 12/7- 1.69 and 7.5mg ,  12/8- 2.51 and 1mg , 12/9- 2.20 and 2mg , 12/10- 2.37 and no Coumadin, 12/11- 1.95 and 4mg ; discharged on 4mg  daily.    Allergies: 1)  ! Morphine 2)  ! Toviaz (Fesoterodine Fumarate)  Anticoagulation Management History:      The patient comes in today for her initial visit for anticoagulation therapy.  Positive risk factors for bleeding include an age of 18 years or older.  The bleeding index is 'intermediate risk'.  Positive CHADS2 values include History of HTN and Age > 54 years old.  Anticoagulation responsible provider: Shirlee Latch MD, [place].  INR POC: 2.5.    Anticoagulation Management Assessment/Plan:      The target INR is 2.0-3.0.  The next INR is due 09/22/2010.  Anticoagulation instructions were given to patient.  Results were reviewed/authorized by Weston Brass, PharmD.  She was notified by Weston Brass PharmD.         Current Anticoagulation Instructions: INR 2.5  Continue same dose of 1 tablet every day.  Recheck INR in 1 week.

## 2010-11-04 NOTE — Letter (Signed)
Summary: Implantable Device Instructions  Architectural technologist, Main Office  [address]   [place], [place] [address]   Phone: [phone]  Fax: [phone]      Implantable Device Instructions  You are scheduled for:  _x____ Permanent Transvenous Pacemaker   on October 21, 2010 at 7:30 am  with Dr. Graciela Husbands.  1.  Please arrive at the Short Stay Center at [facility] at 5:30 am on the day of your procedure.  2.  Do not eat or drink after midnight the night before your procedure.  3.  Complete lab work between January 12-16, 2012.  A prescription has been provided to you. You do not have to be fasting.  4.  Do NOT take Fuosemide the morning of your procedure.  5.  Plan for an overnight stay.  Bring your insurance cards and a list of your medications.  6.  Wash your chest and neck with antibacterial soap (any brand) the evening before and the morning of your procedure.  Rinse well.  7.  Education material received:     Pacemaker __x_           ICD _____           Arrhythmia _____  *If you have ANY questions after you get home, please call the office [phone]. Claris Gladden, RN  *Every attempt is made to prevent procedures from being rescheduled.  Due to the nauture of Electrophysiology, rescheduling can happen.  The physician is always aware and directs the staff when this occurs.

## 2010-11-04 NOTE — Miscellaneous (Signed)
Summary: Device preload  Clinical Lists Changes  Observations: Added new observation of PPM INDICATN: Sick sinus syndrome (10/27/2010 10:05) Added new observation of MAGNET RTE: BOL 100 ERI 85 (10/27/2010 10:05) Added new observation of PPMLEADMOD1: 2088TC (10/27/2010 10:05) Added new observation of PPMLEADSTAT2: active (10/27/2010 10:05) Added new observation of PPMLEADSER2: [id number] (10/27/2010 10:05) Added new observation of PPMLEADMOD2: 1948  (10/27/2010 10:05) Added new observation of PPMLEADLOC2: RV  (10/27/2010 10:05) Added new observation of PPMLEADSTAT1: active  (10/27/2010 10:05) Added new observation of PPMLEADSER1: [id number]  (10/27/2010 10:05) Added new observation of PPMLEADLOC1: RA  (10/27/2010 10:05) Added new observation of PPM IMP MD: Sherryl Manges, MD  (10/27/2010 10:05) Added new observation of PPMLEADDOI2: 10/21/2010  (10/27/2010 10:05) Added new observation of PPMLEADDOI1: 10/21/2010  (10/27/2010 10:05) Added new observation of PPM DOI: 10/21/2010  (10/27/2010 10:05) Added new observation of PPM SERL#: [id number]  (10/27/2010 10:05) Added new observation of PPM MODL#: [id number]  (10/27/2010 57:84) Added new observation of PACEMAKERMFG: St Jude  (10/27/2010 10:05) Added new observation of PACEMAKER MD: Sherryl Manges, MD  (10/27/2010 10:05)      PPM Specifications Following MD:  Sherryl Manges, MD     PPM Vendor:  [employer] Jude     PPM Model Number:  [id number]     PPM Serial Number:  [id number] PPM DOI:  10/21/2010     PPM Implanting MD:  Sherryl Manges, MD  Lead 1    Location: RA     DOI: 10/21/2010     Model #: [id number]     Serial #: [id number]     Status: active Lead 2    Location: RV     DOI: 10/21/2010     Model #: [id number]     Serial #: [id number]     Status: active  Magnet Response Rate:  BOL 100 ERI 85  Indications:  Sick sinus syndrome

## 2010-11-08 ENCOUNTER — Encounter: Payer: Self-pay | Admitting: Internal Medicine

## 2010-11-08 ENCOUNTER — Ambulatory Visit (HOSPITAL_BASED_OUTPATIENT_CLINIC_OR_DEPARTMENT_OTHER): Payer: Medicare Other | Admitting: Hematology & Oncology

## 2010-11-08 DIAGNOSIS — Z7901 Long term (current) use of anticoagulants: Secondary | ICD-10-CM

## 2010-11-08 DIAGNOSIS — D649 Anemia, unspecified: Secondary | ICD-10-CM

## 2010-11-08 DIAGNOSIS — M1990 Unspecified osteoarthritis, unspecified site: Secondary | ICD-10-CM

## 2010-11-08 DIAGNOSIS — D61818 Other pancytopenia: Secondary | ICD-10-CM

## 2010-11-08 DIAGNOSIS — N189 Chronic kidney disease, unspecified: Secondary | ICD-10-CM

## 2010-11-08 LAB — CBC WITH DIFFERENTIAL (CANCER CENTER ONLY)
BASO%: 0.5 % (ref 0.0–2.0)
EOS%: 2.7 % (ref 0.0–7.0)
HCT: 28.9 % — ABNORMAL LOW (ref 34.8–46.6)
LYMPH#: 0.9 10*3/uL (ref 0.9–3.3)
MONO#: 0.6 10*3/uL (ref 0.1–0.9)
NEUT#: 6.2 10*3/uL (ref 1.5–6.5)
NEUT%: 77.9 % (ref 39.6–80.0)
Platelets: 206 10*3/uL (ref 145–400)
RDW: 12.4 % (ref 10.5–14.6)
WBC: 8 10*3/uL (ref 3.9–10.0)

## 2010-11-08 LAB — CHCC SATELLITE - SMEAR

## 2010-11-09 LAB — RETICULOCYTES (CHCC)
ABS Retic: 43.4 10*3/uL (ref 19.0–186.0)
RBC.: 2.89 MIL/uL — ABNORMAL LOW (ref 3.87–5.11)
Retic Ct Pct: 1.5 % (ref 0.4–3.1)

## 2010-11-09 LAB — COMPREHENSIVE METABOLIC PANEL
AST: 23 U/L (ref 0–37)
Alkaline Phosphatase: 116 U/L (ref 39–117)
BUN: 16 mg/dL (ref 6–23)
Creatinine, Ser: 0.97 mg/dL (ref 0.40–1.20)
Glucose, Bld: 93 mg/dL (ref 70–99)

## 2010-11-10 NOTE — Miscellaneous (Signed)
Summary: [facility] Visit  [facility] Visit   Imported By: Roderic Ovens 11/03/2010 10:18:03  _____________________________________________________________________  External Attachment:    Type:   Image     Comment:   External Document

## 2010-11-12 ENCOUNTER — Encounter: Payer: Self-pay | Admitting: Physician Assistant

## 2010-11-15 ENCOUNTER — Encounter (INDEPENDENT_AMBULATORY_CARE_PROVIDER_SITE_OTHER): Payer: Medicare Other

## 2010-11-15 ENCOUNTER — Encounter: Payer: Self-pay | Admitting: Physician Assistant

## 2010-11-15 ENCOUNTER — Other Ambulatory Visit (INDEPENDENT_AMBULATORY_CARE_PROVIDER_SITE_OTHER): Payer: Medicare Other

## 2010-11-15 ENCOUNTER — Ambulatory Visit (INDEPENDENT_AMBULATORY_CARE_PROVIDER_SITE_OTHER): Payer: Medicare Other | Admitting: Physician Assistant

## 2010-11-15 ENCOUNTER — Encounter: Payer: Self-pay | Admitting: Internal Medicine

## 2010-11-15 ENCOUNTER — Other Ambulatory Visit: Payer: Medicare Other

## 2010-11-15 ENCOUNTER — Other Ambulatory Visit: Payer: Self-pay | Admitting: Physician Assistant

## 2010-11-15 DIAGNOSIS — I495 Sick sinus syndrome: Secondary | ICD-10-CM

## 2010-11-15 DIAGNOSIS — I4891 Unspecified atrial fibrillation: Secondary | ICD-10-CM

## 2010-11-15 DIAGNOSIS — I5032 Chronic diastolic (congestive) heart failure: Secondary | ICD-10-CM | POA: Insufficient documentation

## 2010-11-15 DIAGNOSIS — R0602 Shortness of breath: Secondary | ICD-10-CM

## 2010-11-15 DIAGNOSIS — I5033 Acute on chronic diastolic (congestive) heart failure: Secondary | ICD-10-CM

## 2010-11-15 LAB — BASIC METABOLIC PANEL
CO2: 38 mEq/L — ABNORMAL HIGH (ref 19–32)
Chloride: 89 mEq/L — ABNORMAL LOW (ref 96–112)
Glucose, Bld: 78 mg/dL (ref 70–99)
Potassium: 4 mEq/L (ref 3.5–5.1)
Sodium: 141 mEq/L (ref 135–145)

## 2010-11-18 NOTE — Miscellaneous (Signed)
Summary: [facility] Medication Administration Record   [facility]ication Administration Record   Imported By: Roderic Ovens 11/08/2010 10:45:06  _____________________________________________________________________  External Attachment:    Type:   Image     Comment:   External Document

## 2010-11-18 NOTE — Letter (Signed)
Summary: Data processing manager Visit Note   [facility] Visit Note   Imported By: Roderic Ovens 11/08/2010 10:45:48  _____________________________________________________________________  External Attachment:    Type:   Image     Comment:   External Document

## 2010-11-19 ENCOUNTER — Encounter: Payer: Self-pay | Admitting: Pulmonary Disease

## 2010-11-19 ENCOUNTER — Inpatient Hospital Stay (INDEPENDENT_AMBULATORY_CARE_PROVIDER_SITE_OTHER): Payer: Medicare Other | Admitting: Pulmonary Disease

## 2010-11-19 DIAGNOSIS — G4733 Obstructive sleep apnea (adult) (pediatric): Secondary | ICD-10-CM

## 2010-11-22 ENCOUNTER — Other Ambulatory Visit: Payer: Self-pay | Admitting: Physician Assistant

## 2010-11-22 ENCOUNTER — Encounter: Payer: Self-pay | Admitting: Physician Assistant

## 2010-11-22 ENCOUNTER — Ambulatory Visit (INDEPENDENT_AMBULATORY_CARE_PROVIDER_SITE_OTHER): Payer: Medicare Other | Admitting: Physician Assistant

## 2010-11-22 ENCOUNTER — Other Ambulatory Visit: Payer: Medicare Other

## 2010-11-22 ENCOUNTER — Other Ambulatory Visit (INDEPENDENT_AMBULATORY_CARE_PROVIDER_SITE_OTHER): Payer: Medicare Other

## 2010-11-22 DIAGNOSIS — I4891 Unspecified atrial fibrillation: Secondary | ICD-10-CM

## 2010-11-22 DIAGNOSIS — R0602 Shortness of breath: Secondary | ICD-10-CM

## 2010-11-22 DIAGNOSIS — I5033 Acute on chronic diastolic (congestive) heart failure: Secondary | ICD-10-CM

## 2010-11-22 DIAGNOSIS — E785 Hyperlipidemia, unspecified: Secondary | ICD-10-CM

## 2010-11-22 DIAGNOSIS — Z7901 Long term (current) use of anticoagulants: Secondary | ICD-10-CM

## 2010-11-22 LAB — HEPATIC FUNCTION PANEL
AST: 24 U/L (ref 0–37)
Albumin: 3.4 g/dL — ABNORMAL LOW (ref 3.5–5.2)
Alkaline Phosphatase: 95 U/L (ref 39–117)
Bilirubin, Direct: 0.3 mg/dL (ref 0.0–0.3)
Total Protein: 6.2 g/dL (ref 6.0–8.3)

## 2010-11-22 LAB — BASIC METABOLIC PANEL
BUN: 17 mg/dL (ref 6–23)
CO2: 39 mEq/L — ABNORMAL HIGH (ref 19–32)
Chloride: 97 mEq/L (ref 96–112)
Glucose, Bld: 104 mg/dL — ABNORMAL HIGH (ref 70–99)
Potassium: 4.2 mEq/L (ref 3.5–5.1)
Sodium: 144 mEq/L (ref 135–145)

## 2010-11-22 LAB — TSH: TSH: 8.3 u[IU]/mL — ABNORMAL HIGH (ref 0.35–5.50)

## 2010-11-22 NOTE — H&P (Signed)
NAME:  Cindy Cervantes, Cindy Cervantes NO.:  [id number]  MEDICAL RECORD NO.:  [id number]          PATIENT TYPE:  INP  LOCATION:  4706                         FACILITY:  [facility]  PHYSICIAN:  Duke Salvia, MD, FACCDATE OF BIRTH:  10-14-1931  DATE OF ADMISSION:  10/27/2010 DATE OF DISCHARGE:                             HISTORY & PHYSICAL   Ms. Benge is seen at the request of the emergency room because of shortness of breath.  She was admitted last week for pacemaker implantation for atrial fibrillation and tachy-brady syndrome despite the use of ISA beta- blockers.  The pacemaker procedure was complicated by the aspiration of air in the syringe; no pneumothorax was identified then (and subsequently).  She was started on beta-blockers following that admission and comes in today with complaints of weakness, lightheadedness, worsening shortness of breath, and significant disturbing of sleep manifested by dreams.  She has a history of diastolic dysfunction and diastolic heart failure. She has noted progressive peripheral edema over the last couple of days and has urinated briskly following administration of Lasix here in the emergency room.  Her BNP in the emergency room was over 500, and I should note that in December it was in the 100-200 range.  She also has a history of sleep apnea, for which she takes nocturnal oxygen having not been able to tolerate masks.  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  She uses oxygen a lot.  Her related cardiac history is notable for hypertension.  She also had an echo done in December demonstrating normal left ventricular function, normal valves.  Left atrial size was mildly enlarged.  PAST MEDICAL HISTORY:  In addition to the above is notable for: 1. GE reflux disease. 2. Vitamin D deficiency. 3. Dyslipidemia. 4. Osteoarthritis. 5. Cholelithiasis.  PAST SURGICAL HISTORY:  Notable for: 1. Tummy tuck. 2. Bilateral  knee replacements. 3. Bilateral tubal ligation. 4. Cataracts bilaterally.  SOCIAL HISTORY:  She lives over at [facility] by herself.  She is a retired [employer] operational officer.  She is widowed.  She does not use cigarettes, alcohol, or recreational drugs.  FAMILY HISTORY:  Negative for heart disease, but she does have Parkinson disease.  REVIEW OF SYSTEMS:  As noted in the HPI and past medical history.  There have been no intercurrent fevers.  MEDICATIONS:  Warfarin, omeprazole, magnesium, Niaspan, sertraline, flecainide, metoprolol 75 b.i.d., Lasix 20.  She is allergic to MORPHINE and CODEINE.  PHYSICAL EXAMINATION:  GENERAL:  She is an elderly Caucasian female sitting in the bed with oxygen on.  She is in no acute distress. VITAL SIGNS:  Her blood pressure is 118/80, her pulse was 60, respirations were 20. HEENT:  Normal. NECK:  Supple.  There is no lymphadenopathy.  Neck veins were about 6-7 cm.  The carotids are brisk and full. BACK:  Without kyphosis or scoliosis. CHEST:  Lungs sounds were markedly decreased on the right.CARDIAC:  Heart sounds were regular without murmurs or gallops. ABDOMEN:  Soft with active bowel sounds. MUSCULOSKELETAL:  There are no obvious musculoskeletal deformities. There is no CVA tenderness. NEUROLOGIC:  She was alert and oriented  x3.  Grossly normal motor and sensory function. EXTREMITIES:  Without clubbing or cyanosis.  There is 1-2 plus peripheral edema bilaterally. SKIN:  Warm and dry.  LABORATORY DATA:  Today were notable for the BNP as previously noted. Cardiac enzymes were normal.  Metabolic profile demonstrated a creatinine of 1.3 and a potassium of 4.2.  INR was 2.5 and the CBC had the hemoglobin of 9.7, hematocrit 30.1 with macrocytic indices of 104.  IMPRESSION: 1. Shortness of breath with sleep disturbances and orthostatic     lightheadedness since discharge from the hospital on high doses of     beta-blockers. 2. Recent pacemaker insertion  for tachy-brady syndrome with the     addition of the beta-blockers for rate control. 3. Paroxysmal atrial fibrillation. 4. Obstructive sleep apnea. 5. Chronic obstructive pulmonary disease, using oxygen for sleep     apnea, and paralyzed right hemidiaphragm, questionable restrictive     disease. 6. Hypertension. 7. Congestive heart failure - acute on chronic - diastolic. 8. Macrocytic anemia, for which she is undergoing a hematological     workup as we speak.  DISCUSSION:  Ms. Latella presents with a series of symptoms, all of which may be related to her beta-blockers.  We will need to wean her off the beta-blockers and probably put her on a low-dose intrinsic sympathomimetic activity beta-blockers, i.e., pindolol which she tolerated before.  We will begin her on Cardizem for rate control and continue using it in conjunction with her flecainide.  We will use Lasix intravenously to mobilize the peripheral edema.  I have talked with Pulmonary about seeing her given her complex lung situation, and we will have Occupation Therapy and Physical Therapy consult on her.  Thank you for the notification.     Duke Salvia, MD, [facility]     SCK/MEDQ  D:  10/27/2010  T:  10/28/2010  Job:  [id number]  Electronically Signed by Sherryl Manges MD [facility] on 11/22/2010 09:51:00 PM

## 2010-11-22 NOTE — Discharge Summary (Signed)
NAME:  Cindy Cervantes, Cindy Cervantes NO.:  [id number]  MEDICAL RECORD NO.:  [id number]          PATIENT TYPE:  INP  LOCATION:  4706                         FACILITY:  [facility]  PHYSICIAN:  Duke Salvia, MD, FACCDATE OF BIRTH:  08-12-1932  DATE OF ADMISSION:  10/27/2010 DATE OF DISCHARGE:  11/01/2010                              DISCHARGE SUMMARY   PRIMARY CARDIOLOGIST:  Duke Salvia, MD, [facility]  PRIMARY CARE PHYSICIAN:  Lenon Curt. Chilton Si, MD  PULMONOLOGIST:  Coralyn Helling, MD  DISCHARGE DIAGNOSES: 1. Acute-on-chronic diastolic congestive heart failure.  BNP was 5     something and never rechecked, baseline 100-200.     a.     Weight on admission 98.8 kg, weight on discharge 94.7 kg.      BNP 567 on admission, never rechecked.     b.     A 2-D echocardiogram September 08, 2010:  LVEF 65-70%.  No      significant abnormalities found other than mild atrial dilatation. 2. Paroxysmal atrial fibrillation.     a.     Rate control on pindolol (switched from Lopressor),      antiarrhythmic therapy on amio (switched from flecainide), chronic      anticoagulation on Coumadin, diltiazem 120 mg p.o. daily. 3. Probable obesity hypoventilation syndrome/obstructive sleep apnea.     a.     Pulmonology followup/sleep study pending, Dr. Craige Cotta, February      17 at 2:30 p.m. 4. Hypokalemia (with potassium nadir of 3.2, quickly repleted). 5. Elevated right hemidiaphragm.     a.     Determined by Pulmonology to not be paralyzed, no further      interventions recommended.  SECONDARY DIAGNOSES: 1. Tachy-brady syndrome.     a.     Status post [employer] RFDR dual-chamber pacemaker      October 21, 2010. 2. Hypertension. 3. Gastroesophageal reflux disease. 4. Chronic kidney disease, stage III. 5. Macrocytic anemia. 6. Vitamin D deficiency. 7. Dyslipidemia. 8. Osteoarthritis. 9. History of cholelithiasis.  PAST SURGICAL HISTORY: 1. Tummy tuck. 2. Bilateral knee replacements 3.  Bilateral tubal ligation. 4. Cataracts bilaterally.  PAST MEDICAL HISTORY:  Other than described above . 1. Tummy tuck. 2. Bilateral knee replacements.  ALLERGIES AND INTOLERANCES: 1. MORPHINE SULFATE (hallucinations). 2. FESOTERODINE (tachycardia). 3. CODEINE (extreme lethargy).  PROCEDURE: 1. A chest x-ray October 27, 2010:  Stable cardiac enlargement and     vascular congestion without overt pulmonary edema.  Persistent     elevation of the right hemidiaphragm with overlapping vascular     crowding and atelectasis. 2. CT angiography of the chest with contrast October 27, 2010:  No     evidence of pulmonary embolism.  Ground-glass opacities in the left     upper lobe suggesting developing infection.  Tracheobronchial     malacia involving the visualized trachea with areas of air trapping     seen throughout both lungs.  Left chest wall pacemaker in place. 3. Chest fluoroscopy October 28, 2010:  Moderately elevated right     hemidiaphragm redemonstrated, but hemidiaphragm excursion is     relatively symmetric  bilaterally (only 8% different).  Clearly     wheezing on prior injury to the right hemidiaphragm rather than     right phrenic nerve dysfunction.  HISTORY OF PRESENT ILLNESS:  Cindy Cervantes is a 77 year old Caucasian female with the above-noted complex medical history who recently received a pacemaker on October 21, 2010 for tachy-brady syndrome.  She was started on beta-blockers following that procedure to help control heart rates in the setting of atrial fibrillation.  She presented to [facility] ED on October 27, 2010 with complaints of weakness, lightheadedness, worsening shortness of breath, and sleep disturbance.  It was noted by cardiology during her evaluation that her BNP was up above baseline to greater than 500 from baseline of roughly 1-200 and she had excellent urine output with Lasix administration in the emergency department.  The patient was also noted to have  a history of sleep apnea for which she uses oxygen at night and question of a right paralyzed hemidiaphragm which is chronic.  She reports using a lot of oxygen at home.  HOSPITAL COURSE:  The patient was admitted and medications were adjusted by electrophysiologist Dr. Sherryl Manges as he felt some of her symptoms could be contributed to the choice of specific beta-blockade and antiarrhythmic therapy.  She was changed from Lopressor to pindolol which she had apparently tolerated well in the past.  She was changed from flecainide to amiodarone.  It is noteworthy that the patient maintained normal sinus rhythm during her hospital stay.  Pulmonology was consulted due to her question of contribution of sleep apnea or obesity hypoventilation syndrome as well as question of contribution by paralyzed right hemidiaphragm.  Their evaluation determined that the right hemidiaphragm is actually not paralyzed and pretty much symmetrical with the left only 8% difference and therefore no further workup/interventions recommended.  It was also determined that she does not likely have COPD or other severe deconditioning, especially in the setting of her obesity.  Also question of contribution from Lopressor was mentioned.  The patient will have a followup with Pulmonology and likely sleep study coming up (please see followup appointment section). The patient was diuresed with IV Lasix and her volume continued to improve to the point what was thought to be euvolemic or as close to as possible on November 01, 2010.  The patient has been set up with followup at [facility] to see physician assistant Tereso Newcomer on February 13 and will also have basic metabolic panel and Coumadin Clinic appointment that day.  She will see Dr. Graciela Husbands in approximately 3 months but that appointment is unable to be made at this time.  At the time of discharge, the patient received her new medication list,  prescriptions, followup instructions, and all questions and concerns were addressed prior to leaving the hospital.  Please note the patient received a social work consult during this admission as well and PT/OT consult, all of which resulted in her discharge back to [facility] were she came from.  DISCHARGE LABS:  WBC 9.5, HGB 9.4, HCT 30.5, PLT count is 192.  Pro time 23.8, INR 2.11.  Sodium 140, potassium 3.2, chloride 82, bicarb 43, BUN 14, creatinine 1.35, glucose 107, calcium 8.8.  Cardiac enzymes negative x2.  BNP 567.  Vitamin B12 485.  MRSA negative by PCR.  FOLLOWUP PLANS AND APPOINTMENTS: 1. [facility], see Tereso Newcomer, labs, Coumadin Clinic,     November 15, 2010 at 11:00 a.m. 2. Dr. Craige Cotta of pulmonology February 17 at  2:30 p.m.  DISCHARGE MEDICATIONS: 1. Amiodarone 200 mg 2 tablets p.o. b.i.d. x2 weeks and then 2 tablets     daily thereafter. 2. Diltiazem 120 mg 1 tablet p.o. daily. 3. Pindolol 5 mg 1 tablet p.o. b.i.d. 4. Warfarin 3 mg 1 tablet daily. 5. Potassium chloride 20 mEq 1 tab p.o. every Monday. 6. Acetaminophen 325 mg, 1-2 tabs p.o. q.4 h. p.r.n. 7. Lasix 20 mg p.o. daily. 8. Magnesium oxide 400 mg 1 tablet p.o. daily. 9. Niaspan 500 mg 1 tablet daily. 10.Omeprazole 20 mg p.o. q.a.m. 11.Sertraline 25 mg p.o. daily.  DURATION OF DISCHARGE ENCOUNTER INCLUDING PHYSICIAN TIME:  35 minutes.     Jarrett Ables, PAC   ______________________________ Duke Salvia, MD, [facility]    MS/MEDQ  D:  11/01/2010  T:  11/01/2010  Job:  [id number]  cc:   Duke Salvia, MD, [facility] Tereso Newcomer, PA-C Coralyn Helling, MD Lenon Curt. Chilton Si, M.D.  Electronically Signed by Jarrett Ables PAC on 11/10/2010 02:59:33 PM Electronically Signed by Sherryl Manges MD [facility] on 11/22/2010 09:51:04 PM

## 2010-11-22 NOTE — Op Note (Signed)
  NAME:  CALIE, BUTTREY               ACCOUNT NO.:  [id number]  MEDICAL RECORD NO.:  [id number]          PATIENT TYPE:  OIB  LOCATION:  6525                         FACILITY:  [facility]  PHYSICIAN:  Duke Salvia, MD, FACCDATE OF BIRTH:  [birth year]  DATE OF PROCEDURE:  10/21/2010 DATE OF DISCHARGE:                              OPERATIVE REPORT   PREOPERATIVE DIAGNOSIS:  Tachybrady syndrome.  POSTOPERATIVE DIAGNOSIS:  Tachybrady syndrome.  PROCEDURE:  Dual-chamber pacemaker implantation.  COMPLICATIONS:  Aspiration of air.  Following obtaining informed consent, the patient was brought to the electrophysiology laboratory and placed on the fluoroscopic table in supine position.  After routine prep and drape of the left upper chest, lidocaine was infiltrated in the prepectoral subclavicular region, incision was made and carried down to the layer of the prepectoral fascia with electrocautery and sharp dissection.  A pocket was formed similarly.  Hemostasis was obtained.  Thereafter attention was turned to gain access to the extrathoracic left subclavian vein which was accomplished with modest difficulty.  On two occasions despite fluoroscopic guidance, I ended up below the rib and on one occasion I ended up with air in the syringe.  Fluoroscopy throughout the case did not demonstrate collection of air.  Oxygen saturations were stable.  We were then able to get access to the vein and a double wire technique was utilized to allow for access of 2 sheaths sequentially, through which I  passed a St. Jude 1948 52 cm passive-fixation ventricular lead serial number [id number] and a St. Jude 28 TC active-fixation atrial lead serial number [id number].  Under fluoroscopic guidance, these leads were manipulated into the right ventricular apex and the right atrial appendage respectively where the bipolar R-wave was 18.2 with a pace impedance of 611 and threshold of 0.6 at 0.5, current of 5 volts of  0.8 mA.  There was no diaphragmatic pacing at 10 volts and the current of injury was not applicable.  Bipolar P-wave (flutter) was 1.1-1.7 mV with pace impedance of 457.  The current of injury was brisk.  These leads were secured to the prepectoral fascia (with some challenge).  These leads were then attached to a St. Jude Accent RFDR pulse generator model PM 2210, serial number [id number].  Ventricular pacing and mode switching were identified. The pocket was copiously irrigated with antibiotic containing saline solution.  Hemostasis was assured.  The leads and pulse generator were placed in the pocket, secured to the prepectoral fascia and the wound was closed in 3 layers in a normal fashion.  The wound was washed, dried and a benzoin and Steri-Strip dressing were applied.  Needle count, sponge counts and instrument counts were correct at the end of the procedure according to the staff.  The patient tolerated the procedure without apparent complication.     Duke Salvia, MD, [facility]     SCK/MEDQ  D:  10/21/2010  T:  10/21/2010  Job:  [id number]  Electronically Signed by Sherryl Manges MD [facility] on 11/22/2010 09:50:29 PM

## 2010-11-22 NOTE — Discharge Summary (Signed)
NAME:  Cindy Cervantes, Cindy Cervantes NO.:  [id number]  MEDICAL RECORD NO.:  [id number]          PATIENT TYPE:  OIB  LOCATION:  6525                         FACILITY:  [facility]  PHYSICIAN:  Duke Salvia, MD, FACCDATE OF BIRTH:  Jun 27, 1932  DATE OF ADMISSION:  10/21/2010 DATE OF DISCHARGE:  10/22/2010                              DISCHARGE SUMMARY   PRIMARY CARDIOLOGIST:  Duke Salvia, MD, [facility]  PRIMARY CARE PROVIDER:  Lenon Curt. Chilton Si, MD  DISCHARGE DIAGNOSIS:  Sick sinus syndrome status post St. Jude Medical dual-chamber permanent pacemaker placement this admission.  SECONDARY DIAGNOSES: 1. Paroxysmal atrial fibrillation. 2. Chronic diastolic congestive heart failure. 3. Chronic obstructive pulmonary disease. 4. Osteoarthritis. 5. Hypertension. 6. Hyperlipidemia. 7. Obstructive sleep apnea. 8. Chronic macrocytic anemia.  ALLERGIES:  MORPHINE and TOVIAZ.  PROCEDURES:  Successful placement of a St. Jude Medical Accent RF DR dual-chamber permanent pacemaker serial [id number].  HISTORY OF PRESENT ILLNESS:  A 77 year old female with history of paroxysmal atrial fibrillation as well as sick sinus syndrome and sinus bradycardia (preventing additional titration of rate controlling agents. Complicating this and this patient's diastolic heart failure which worsened with elevated rates in AFib.  The patient was seen in clinic by Dr. Graciela Husbands on October 07, 2010, and it was felt that she would benefit from pacemaker placement to ultimately improve ability to rate control atrial fibrillation.  Arrangements were made in the outpatient setting.  HOSPITAL COURSE:  The patient presented to the [facility] on October 21, 2010, underwent successful placement of a St. Jude Medical Accent RF DR [id number] serial [id number] dual-chamber permanent pacemaker. The patient tolerated this procedure well.  Postprocedure chest x-ray shows no evidence of pneumothorax.  We planned to discharge her  home today in good condition.  With pacemaker placement, we have reinitiated beta-blocker therapy and rates currently are in the 90s.  Of note, [name] has chronic macrocytic anemia with previously normal folate and B12 along with ferritin.  Dr. Graciela Husbands has spoken with Hematology and outpatient followup arrangements will take place through Dr. Gustavo Lah office.  DISCHARGE LABORATORY DATA:  Hemoglobin 9.6, hematocrit 30.7, WBC 6.8, platelets 298, INR 2.01.  DISPOSITION:  The patient will be discharged home today in good condition.  FOLLOWUP PLANS AND APPOINTMENTS:  We have arranged for followup with [facility] on November 03, 2010, at 4 p.m. and then again on November 17, 2010, at 11 a.m. to follow up rate control on patient's atrial fibrillation.  She will follow up with Dr. Graciela Husbands in approximately 3 months.  She will follow up with Dr. Murray Hodgkins as previously scheduled.  She will follow up with [facility] on November 03, 2010, at 3:45 p.m., on the same day she has seen Device Clinic.  DISCHARGE MEDICATIONS: 1. Acetaminophen 325 mg 1-2 tabs q.4 hours p.r.n. 2. Metoprolol 50 mg 1/2 tablet b.i.d. 3. Lasix 20 mg daily. 4. Coumadin 2 mg 2 tabs q.p.m. 5. Famotidine 20 mg q.p.m. 6. Flecainide 1 mg b.i.d. 7. Magnesium oxide 400 mg daily. 8. Niaspan SR 500 mg daily. 9. Omeprazole 20 mg daily. 10.Zoloft 25  mg daily.  OUTSTANDING LABORATORY STUDIES:  Follow up INR in Coumadin Clinic on November 03, 2010.  DURATION DISCHARGE ENCOUNTER:  Forty-five minutes including physician time.     Nicolasa Ducking, ANP   ______________________________ Duke Salvia, MD, [facility]    CB/MEDQ  D:  10/22/2010  T:  10/22/2010  Job:  [id number]  cc:   Lenon Curt. Chilton Si, M.D.  Electronically Signed by Nicolasa Ducking ANP on 11/01/2010 12:24:30 PM Electronically Signed by Sherryl Manges MD [facility] on 11/22/2010 09:50:34 PM

## 2010-11-23 ENCOUNTER — Telehealth: Payer: Self-pay | Admitting: Physician Assistant

## 2010-11-24 ENCOUNTER — Encounter: Payer: Self-pay | Admitting: Internal Medicine

## 2010-11-24 ENCOUNTER — Telehealth: Payer: Self-pay | Admitting: Internal Medicine

## 2010-11-24 NOTE — Cardiovascular Report (Signed)
Summary: Pre-Op Orders  Pre-Op Orders   Imported By: Marylou Mccoy 11/16/2010 14:46:43  _____________________________________________________________________  External Attachment:    Type:   Image     Comment:   External Document

## 2010-11-24 NOTE — Letter (Signed)
Summary: [facility]   Imported By: Marylou Mccoy 11/15/2010 15:00:37  _____________________________________________________________________  External Attachment:    Type:   Image     Comment:   External Document

## 2010-11-24 NOTE — Assessment & Plan Note (Signed)
Summary: eph.per mat pa.gd   Visit Type:  Follow-up Referring Provider:  pcp Primary Provider:  Dr. Murray Hodgkins  CC:  Pt in Rehab at [facility]--Pt has a tightness in her chest at night when she gets in and out of her bed . Pt also complains of fatigue.  History of Present Illness: Primary Electrophysiologist:  Dr. Sherryl Manges  Cindy Cervantes is a 77 yo female with recurrent parox AFib and diastolic CHF.  She has been treated with flecainide in the past.  She presented to [facility] 1/25 with a/c diastolic CHF and weakness.  She was diuresed.  Her weakness was thought to be partially explained by medication intolerance.  She was switched from metoprolol to pindolol and flecainide was switched to amiodarone.  She remained in NSR during her hospitalization.  She was seen by pulmonary due to concerns of COPD and a paralyzed right hemidiaphragm.  She had fluoroscopy and it was felt that she does not have a paralyzed hemidiaphragm. It was also felt that she does not have COPD.  There is a question of sleep apnea or obesity hypoventilation syndrome.  She has follow up with Dr. Craige Cotta in a couple this week.  She returns for follow up.  Prior to discharge her labs included a potassium of 3.2, BUN 14, creatinine 1.5, hemoglobin 9.4.  She states that she's feeling better.  By our scale she's gained 8 pounds since discharge.  In looking through her hospital chart, she weighed about 217 pounds when she was admitted.  She states her breathing is much better since admission to the hospital.  She sleeps at an incline.  However, she has recently decreased the amount of the incline.  She does get short of breath getting into bed and feels some tightness in her stomach.  She works with physical therapy.  She denies any chest heaviness or tightness with activity.  She denies syncope.  Her lower extremity edema seems to be better.  She has followup with pulmonology later this week.  Her weights at [facility] have been  steadily going up.  She is trying to watch her salt intake.  Current Medications (verified): 1)  Warfarin Sodium 4 Mg Tabs (Warfarin Sodium) .... Take 1 By Mouth At Bedtime 2)  Niaspan 500 Mg Cr-Tabs (Niacin (Antihyperlipidemic)) .... Take 1 Tab By Mouth Daily 3)  Magnesium Oxide .Marland Kitchen.. 1 Po Daily 4)  Prilosec Otc 20 Mg Tbec (Omeprazole Magnesium) .Marland Kitchen.. 1 Cap By Mouth Daily 5)  Sertraline Hcl 25 Mg Tabs (Sertraline Hcl) .... Take 1 Tab By Mouth Daily 6)  Pindolol 5 Mg Tabs (Pindolol) .Marland Kitchen.. 1 Tab Bid 7)  Amiodarone Hcl 200 Mg Tabs (Amiodarone Hcl) .... Take 2 Tabs Two Times A Day 8)  Diltiazem Hcl Er Beads 120 Mg Xr24h-Cap (Diltiazem Hcl Er Beads) .... Take One Capsule By Mouth Daily 9)  Tylenol 325 Mg Tabs (Acetaminophen) .... As Needed 10)  Torsemide 10 Mg Tabs (Torsemide) .Marland Kitchen.. 1 Tab in The Am and 1/2 Tab in The Afternnon 11)  Potassium Chloride Crys Cr 20 Meq Cr-Tabs (Potassium Chloride Crys Cr) .... Take One Tablet By Mouth Daily 12)  Miralx .... Prn 13)  02 .... Set At 3 Liters 24 Hours Daily  Allergies (verified): 1)  ! Morphine 2)  ! Toviaz (Fesoterodine Fumarate)  Past History:  Past Medical History: Parox AFib with RVR   a.  Flecainide Rx started 09/2010. . . . changed to Amiodarone 1/25-30/2012 admission   b.  Coumadin (CHADS2=2)  c. s/p pacer 10/2010 2/2 tachy-brady Chronic Diastolic CHF   a. Echo 09/2010: EF 65-70% h/o Hyperkalemia in setting of spironolactone 09/2010 URINARY INCONTINENCE (ICD-788.30) KNEE JOINT REPLACEMENT BY OTHER MEANS (ICD-V43.65) OSTEOARTHRITIS (ICD-715.90) C O P D (ICD-496)  pft's rec...................................Marland KitchenSherene Sires    - PFT's March 29 2010 >> FEV1 1.34 (72) with ratio 75 and ERV 42, DLC0 55 > corrects to 137 OBESITY     - HC03 36  Feb 11 2010  ? OHS ALLERGIC RHINITIS (ICD-477.9) HYPERTENSION (ICD-401.9) UNSPECIFIED SENILE CATARACT (ICD-366.10) HYPERLIPIDEMIA (ICD-272.4) XEROSTOMIA (ICD-527.7) DYSPHAGIA UNSPECIFIED (ICD-787.20) COUGH  (ICD-786.2)      - Better on gerd rx March 08, 2010      - Sinus ct March 08, 2010 >> OBSTRUCTIVE SLEEP APNEA (ICD-327.23) DIVERTICULOSIS, COLON (ICD-562.10) DYSPNEA (ICD-786.05)     - No desat walking March 08, 2010      - CT chest March 09 2010 >>> nl lung but abn liver > deferred w/u to Dr Chilton Si PERSONAL HISTORY OF COLONIC POLYPS (ICD-V12.72) Renal Insuf..  Review of Systems       She saw Dr. Myna Hidalgo recently for anemia and had an EPO level drawn.  She is awaiting the results.  If low, she may get EPO shots or a BM bx if normal.  Otherwise, as per  the HPI.  All other systems reviewed and negative.   Vital Signs:  Patient profile:   77 year old female Height:      64 inches Weight:      216 pounds Pulse rate:   59 / minute BP sitting:   146 / 76  (left arm) Cuff size:   large  Vitals Entered By: Burnett Kanaris, CNA (November 15, 2010 11:26 AM)  Physical Exam  General:  Chronically ill appearing femal on O2 in a wheelchair, in no acute distress HEENT: normal Neck: no JVD at 90 degrees Cardiac:  normal S1, S2; RRR; no murmur Lungs:  bibasilar crackles; no wheezing  Abd: soft, nontender, no hepatomegaly Ext: 2+ bilat edema up to her thighs Skin: warm and dry Neuro:  CNs 2-12 intact, no focal abnormalities noted    EKG  Procedure date:  11/15/2010  Findings:      atrial pace Heart rate 60 Normal axis Nonspecific ST-T wave changes Incomplete right bundle branch block  PPM Specifications Following MD:  Sherryl Manges, MD     PPM Vendor:  [employer]     PPM Model Number:  [id number]     PPM Serial Number:  [id number] PPM DOI:  10/21/2010     PPM Implanting MD:  Sherryl Manges, MD  Lead 1    Location: [facility]     DOI: 10/21/2010     Model #: [id number]     Serial #: [id number]     Status: active Lead 2    Location: [facility]     DOI: 10/21/2010     Model #: [id number]     Serial #: [id number]     Status: active  Magnet Response Rate:  BOL 100 ERI 85  Indications:  Sick sinus syndrome   Impression &  Recommendations:  Problem # 1:  ACUTE ON CHRONIC DIASTOLIC HEART FAILURE (ICD-428.33) She is still volume overloaded.  Her weight is up 8 pounds since she was discharged fron the hospital.  Overall, she feels better symptomatically. She is trying to be compliant with no salt.   I have increased her torsemide to 20 mg in the morning.  She will continue to take 5 mg in the afternoon.  I will increase her potassium to 20 mEq twice a day.  She will have a basic metabolic panel today and a BNP.  I will see her back in close followup in one week.  Her weights will be monitored at [facility].  I've written instructions to call as if her weight should go up any further or she develops worsening shortness of breath.  Check bmet at follow up in one week.  Problem # 2:  ATRIAL FIBRILLATION (ICD-427.31) She is maintaining sinus rhythm today.  Upon interrogation of her device, she has had atrial fibrillation about 20% at time since discharge from the hospital.  Overall, the duration and frequency of her atrial fibrillation seems to be going down.  Hopefully, with amiodarone this will continue to be less and less.  She will need to be set up for followup LFTs and TSH when she is seen back next week in the setting of amiodarone therapy.  Orders: EKG w/ Interpretation (93000) TLB-BMP (Basic Metabolic Panel-BMET) (80048-METABOL) TLB-BNP (B-Natriuretic Peptide) (83880-BNPR)  Problem # 3:  ANEMIA (ICD-285.9) She has been evaluated by hematology.  It sounds as though she's waiting on an EPO level to come back.  Problem # 4:  DYSPNEA (ICD-786.05) Probably multifactorial.  There is certainly a component of diastolic heart failure playing a role.  She has been hypoxic.  She's now on 3 L per minute nasal cannula.  She sees pulmonology later this week.  I believe there is probably a plan to further evaluate her for sleep apnea  Problem # 5:  HYPERTENSION (ICD-401.9) Adjust diuretics as above.  Patient Instructions: 1)   Your physician recommends that you return for lab work in: BMET 427.31, BNP 427.31, 782.3 2)  Your physician recommends that you schedule a follow-up appointment in: 11/22/10 with Tereso Newcomer, PA-C @ 10:30. 3)  Your physician has recommended you make the following change in your medication: Increase Torsemide 10 mg to 2 tabs in the morning and 5 mg in the afternoon. Increase potassium  to 20 mEq twice daily. 4)  Your physician recommends that you weigh, daily, at the same time every day, and in the same amount of clothing.  Please record your daily weights on the handout provided and bring it to your next appointment. CAll office if weight is increased by 3lbs or more in one day and you are short of breath. Prescriptions: POTASSIUM CHLORIDE CRYS CR 20 MEQ CR-TABS (POTASSIUM CHLORIDE CRYS CR) 1 tab two times a day  #60 x 11   Entered by:   Danielle Rankin, CMA   Authorized by:   Tereso Newcomer PA-C   Signed by:   Danielle Rankin, CMA on 11/15/2010   Method used:   Electronically to        [facility]. [phone]* (retail)       [phone] [address].       [place], [place]  [address]       Ph: [phone]       Fax: [phone]   RxID:   [id number] TORSEMIDE 10 MG TABS (TORSEMIDE) 2 tab in the am and 1/2 tab of the 10 mg in the afternoon  #90 x 11   Entered by:   Danielle Rankin, CMA   Authorized by:   Tereso Newcomer PA-C   Signed by:   Danielle Rankin, CMA on 11/15/2010   Method used:  Electronically to        [facility]. [phone]* (retail)       [phone] [address].       [place], [place]  [address]       Ph: [phone]       Fax: [phone]   RxID:   [id number]  I have personally reviewed the prescriptions today for accuracy.Tereso Newcomer PA-C  November 15, 2010 2:59 PM

## 2010-11-29 ENCOUNTER — Encounter: Payer: Self-pay | Admitting: Physician Assistant

## 2010-11-30 ENCOUNTER — Other Ambulatory Visit: Payer: Medicare Other

## 2010-11-30 ENCOUNTER — Encounter: Payer: Self-pay | Admitting: Physician Assistant

## 2010-11-30 NOTE — Letter (Signed)
Summary: Physician Orders  Physician Orders   Imported By: Marylou Mccoy 11/22/2010 11:18:08  _____________________________________________________________________  External Attachment:    Type:   Image     Comment:   External Document

## 2010-11-30 NOTE — Cardiovascular Report (Signed)
Summary: Office Visit   Office Visit   Imported By: Roderic Ovens 11/26/2010 09:07:58  _____________________________________________________________________  External Attachment:    Type:   Image     Comment:   External Document

## 2010-11-30 NOTE — Assessment & Plan Note (Signed)
Summary: f/u on CHF   Visit Type:  Follow-up Referring Provider:  pcp Primary Provider:  Dr. Murray Hodgkins  CC:  tight under R breast (lung problems on R side).  History of Present Illness: Primary Electrophysiologist:  Dr. Sherryl Manges  Cindy Cervantes is a 77 yo female with recurrent parox AFib, s/p pacer and diastolic CHF.  She has been treated with flecainide in the past.  She presented to [facility] 1/25 with a/c diastolic CHF and weakness.  She was diuresed.  Her weakness was thought to be partially explained by medication intolerance.  She was switched from metoprolol to pindolol and flecainide was switched to amiodarone.  She remained in NSR during her hospitalization.  She was seen by pulmonary due to concerns of COPD and a paralyzed right hemidiaphragm.  She had fluoroscopy and it was felt that she does not have a paralyzed hemidiaphragm. It was also felt that she does not have COPD.  There is a question of sleep apnea or obesity hypoventilation syndrome.  I saw her last week and her weight was up 8 pounds since discharge.  I increased her torsemide and had her f/u today.  She has seen Dr. Craige Cotta and she has a sleep study pending.  She continues on O2 at 3LPM.  She notes her breathing is somewhat better.  Her swelling is also somewhat better. She denies chest pain with exertion.  She gets short of breath getting into bed at night and feels a discomfort around her chest that feels like a band.  She sits up and it gets better.  No syncope.  Of note, she has seen hematology but has not heard anything back yet regarding EPO shots vs. BM biopsy.    Current Medications (verified): 1)  Warfarin Sodium 4 Mg Tabs (Warfarin Sodium) .... As Directed 2)  Niaspan 500 Mg Cr-Tabs (Niacin (Antihyperlipidemic)) .... Take 1 Tab By Mouth Daily 3)  Magnesium Oxide .Marland Kitchen.. 1 Po Daily 4)  Prilosec Otc 20 Mg Tbec (Omeprazole Magnesium) .Marland Kitchen.. 1 Cap By Mouth Daily 5)  Sertraline Hcl 25 Mg Tabs (Sertraline Hcl) .... Take 1 Tab  By Mouth Daily 6)  Pindolol 5 Mg Tabs (Pindolol) .Marland Kitchen.. 1 Tab Bid 7)  Amiodarone Hcl 200 Mg Tabs (Amiodarone Hcl) .... Take 2 Tabs Once Daily 8)  Diltiazem Hcl Er Beads 120 Mg Xr24h-Cap (Diltiazem Hcl Er Beads) .... Take One Capsule By Mouth Daily 9)  Tylenol 325 Mg Tabs (Acetaminophen) .... As Needed 10)  Torsemide 10 Mg Tabs (Torsemide) .... 2 Tab in The Am and 1/2 Tab of The 10 Mg in The Afternoon 11)  Potassium Chloride Crys Cr 20 Meq Cr-Tabs (Potassium Chloride Crys Cr) .Marland Kitchen.. 1 Tab Two Times A Day 12)  Miralx .... Prn 13)  02 .... Set At 3 Liters 24 Hours Daily  Allergies (verified): 1)  ! Morphine 2)  ! Toviaz (Fesoterodine Fumarate)  Past History:  Past Medical History: Last updated: 11/15/2010 Parox AFib with RVR   a.  Flecainide Rx started 09/2010. . . . changed to Amiodarone 1/25-30/2012 admission   b.  Coumadin (CHADS2=2)   c. s/p pacer 10/2010 2/2 tachy-brady Chronic Diastolic CHF   a. Echo 09/2010: EF 65-70% h/o Hyperkalemia in setting of spironolactone 09/2010 URINARY INCONTINENCE (ICD-788.30) KNEE JOINT REPLACEMENT BY OTHER MEANS (ICD-V43.65) OSTEOARTHRITIS (ICD-715.90) C O P D (ICD-496)  pft's rec...................................Marland KitchenSherene Sires    - PFT's March 29 2010 >> FEV1 1.34 (72) with ratio 75 and ERV 42, DLC0 55 > corrects to 137  OBESITY     - HC03 36  Feb 11 2010  ? OHS ALLERGIC RHINITIS (ICD-477.9) HYPERTENSION (ICD-401.9) UNSPECIFIED SENILE CATARACT (ICD-366.10) HYPERLIPIDEMIA (ICD-272.4) XEROSTOMIA (ICD-527.7) DYSPHAGIA UNSPECIFIED (ICD-787.20) COUGH (ICD-786.2)      - Better on gerd rx March 08, 2010      - Sinus ct March 08, 2010 >> OBSTRUCTIVE SLEEP APNEA (ICD-327.23) DIVERTICULOSIS, COLON (ICD-562.10) DYSPNEA (ICD-786.05)     - No desat walking March 08, 2010      - CT chest March 09 2010 >>> nl lung but abn liver > deferred w/u to Dr Chilton Si PERSONAL HISTORY OF COLONIC POLYPS (ICD-V12.72) Renal Insuf..  Review of Systems       As per  the HPI.  All  other systems reviewed and negative.   Vital Signs:  Patient profile:   77 year old female Height:      64 inches Weight:      211 pounds BMI:     36.35 Pulse rate:   70 / minute Pulse rhythm:   regular BP sitting:   138 / 80  (right arm) Cuff size:   regular  Vitals Entered By: Hardin Negus, RMA (November 22, 2010 10:53 AM)  Physical Exam  General:  Chronically ill appearing femal on O2 in a wheelchair, in no acute distress HEENT: normal Neck: no JVD at 90 degrees Cardiac:  normal S1, S2; RRR; no murmur Lungs:  bibasilar crackles; no wheezing  Abd: soft, nontender, no hepatomegaly Ext: 1-2+ bilat edema to her knees Skin: warm and dry Neuro:  CNs 2-12 intact, no focal abnormalities noted    PPM Specifications Following MD:  Sherryl Manges, MD     PPM Vendor:  [employer]     PPM Model Number:  [id number]     PPM Serial Number:  [id number] PPM DOI:  10/21/2010     PPM Implanting MD:  Sherryl Manges, MD  Lead 1    Location: [facility]     DOI: 10/21/2010     Model #: [id number]     Serial #: [id number]     Status: active Lead 2    Location: [facility]     DOI: 10/21/2010     Model #: [id number]     Serial #: [id number]     Status: active  Magnet Response Rate:  BOL 100 ERI 85  Indications:  Sick sinus syndrome   Impression & Recommendations:  Problem # 1:  ACUTE ON CHRONIC DIASTOLIC HEART FAILURE (ICD-428.33) This is overall improved.  Her weight is down 5 pounds since I last saw her.  She seems to feel somewhat better.  She does note this bandlike sensation when she gets into bed at night.  I question whether or not this may be related to CHF.  She denies chest discomfort with exertion. She works with physical therapy daily.  Since her weight is down 5 pounds over a week, I think I will keep her at the same dose of torsemide.  She seems to be responding to this very well.  She will have a basic metabolic panel today.  I will also obtain a BNP (it was 877 one week ago).  She will be brought back for followup in the  next several weeks.  Problem # 2:  ATRIAL FIBRILLATION (ICD-427.31) Her Coumadin is followed at the nursing home.  She continues on amiodarone.  We will obtain LFTs and a TSH today for baseline while she is on amiodarone.  She seems  to be tolerating her medications.  Orders: TLB-BMP (Basic Metabolic Panel-BMET) (80048-METABOL) TLB-BNP (B-Natriuretic Peptide) (83880-BNPR) TLB-TSH (Thyroid Stimulating Hormone) (84443-TSH)  Problem # 3:  ANEMIA (ICD-285.9) Followup with hematology.   Problem # 4:  DYSPNEA (ICD-786.05) Sleep study pending with pulmonology.  She continues on O2.  Problem # 5:  HYPERTENSION (ICD-401.9)  Better controlled.  Her updated medication list for this problem includes:    Pindolol 5 Mg Tabs (Pindolol) .Marland Kitchen... 1 tab bid    Diltiazem Hcl Er Beads 120 Mg Xr24h-cap (Diltiazem hcl er beads) .Marland Kitchen... Take one capsule by mouth daily    Torsemide 10 Mg Tabs (Torsemide) .Marland Kitchen... 2 tab in the am and 1/2 tab of the 10 mg in the afternoon  Other Orders: TLB-Hepatic/Liver Function Pnl (80076-HEPATIC)  Patient Instructions: 1)  Your physician recommends that you schedule a follow-up appointment in: 3-4 weeks with Dr. Graciela Husbands...ok as per Scherrie Bateman to double book. 2)  Your physician recommends that you continue on your current medications as directed. Please refer to the Current Medication list given to you today. 3)  Your physician recommends that you return for lab work in: Today BMET, 427.31, BNP 427.31, 782.3, LFT 272.4, TSH 427.31

## 2010-11-30 NOTE — Progress Notes (Signed)
Summary: re blood work orders  Phone Note From [employer] back at [phone]   Caller: [facility]/susan rn Summary of Call: susan needs orders for blood work for pt. pt stated the doctor wanted him to have blood work next week. susan states they can do that for pt so he does not need to come in if its okay with the dr. Initial call taken by: Roe Coombs,  November 24, 2010 1:32 PM  Follow-up for Phone Call        [facility] NEED FAX NUMBER. NUM [phone] Follow-up by: Scherrie Bateman, LPN,  November 24, 2010 2:26 PM  Additional Follow-up for Phone Call Additional follow up Details #1::        ORDER FAXED TO FACILITY Additional Follow-up by: Scherrie Bateman, LPN,  November 26, 2010 8:59 AM

## 2010-11-30 NOTE — Procedures (Signed)
Summary: device check.gd   Current Medications (verified): 1)  Warfarin Sodium 4 Mg Tabs (Warfarin Sodium) .... Take 1 By Mouth At Bedtime 2)  Niaspan 500 Mg Cr-Tabs (Niacin (Antihyperlipidemic)) .... Take 1 Tab By Mouth Daily 3)  Magnesium Oxide .Marland Kitchen.. 1 Po Daily 4)  Pepcid 20 Mg Tabs (Famotidine) .Marland Kitchen.. 1 Tab By Mouth At Bedtime 5)  Prilosec Otc 20 Mg Tbec (Omeprazole Magnesium) .Marland Kitchen.. 1 Cap By Mouth Daily 6)  Sertraline Hcl 25 Mg Tabs (Sertraline Hcl) .... Take 1 Tab By Mouth Daily 7)  Flecainide Acetate 100 Mg Tabs (Flecainide Acetate) .... One Tab Two Times A Day 8)  Furosemide 20 Mg Tabs (Furosemide) .... Once Daily 9)  Pindolol 5 Mg Tabs (Pindolol) .... 1/2 Tablet Daily  Allergies (verified): 1)  ! Morphine 2)  ! Gala Murdoch (Fesoterodine Fumarate)  PPM Specifications Following MD:  Sherryl Manges, MD     PPM Vendor:  [employer]     PPM Model Number:  [id number]     PPM Serial Number:  [id number] PPM DOI:  10/21/2010     PPM Implanting MD:  Sherryl Manges, MD  Lead 1    Location: [facility]     DOI: 10/21/2010     Model #: [id number]     Serial #: [id number]     Status: active Lead 2    Location: [facility]     DOI: 10/21/2010     Model #: [id number]     Serial #: [id number]     Status: active  Magnet Response Rate:  BOL 100 ERI 85  Indications:  Sick sinus syndrome

## 2010-11-30 NOTE — Progress Notes (Signed)
Summary: medication changes  Phone Note Call from Patient Call back at Home Phone [phone]   Caller: Daughter/Leslie Lindell Noe Reason for Call: Talk to Nurse, Talk to Doctor Summary of Call: You spoke with daughter about changing some meds for this pt and her provider at [facility] re[facility] needs you to call and tell them about the medication change. [phone]  Initial call taken by: Omer Jack,  November 23, 2010 4:21 PM

## 2010-12-01 ENCOUNTER — Other Ambulatory Visit: Payer: Self-pay | Admitting: Hematology & Oncology

## 2010-12-01 ENCOUNTER — Encounter (HOSPITAL_BASED_OUTPATIENT_CLINIC_OR_DEPARTMENT_OTHER): Payer: Medicare Other | Admitting: Hematology & Oncology

## 2010-12-01 DIAGNOSIS — D649 Anemia, unspecified: Secondary | ICD-10-CM

## 2010-12-01 DIAGNOSIS — N189 Chronic kidney disease, unspecified: Secondary | ICD-10-CM

## 2010-12-01 LAB — CBC WITH DIFFERENTIAL (CANCER CENTER ONLY)
BASO#: 0 10*3/uL (ref 0.0–0.2)
EOS%: 2.4 % (ref 0.0–7.0)
Eosinophils Absolute: 0.2 10*3/uL (ref 0.0–0.5)
HGB: 8.7 g/dL — ABNORMAL LOW (ref 11.6–15.9)
LYMPH#: 1 10*3/uL (ref 0.9–3.3)
MCHC: 32.8 g/dL (ref 32.0–36.0)
MONO#: 0.5 10*3/uL (ref 0.1–0.9)
NEUT#: 4.8 10*3/uL (ref 1.5–6.5)
RBC: 2.69 10*6/uL — ABNORMAL LOW (ref 3.70–5.32)
WBC: 6.5 10*3/uL (ref 3.9–10.0)

## 2010-12-07 ENCOUNTER — Telehealth: Payer: Self-pay | Admitting: Internal Medicine

## 2010-12-07 ENCOUNTER — Encounter: Payer: Self-pay | Admitting: Internal Medicine

## 2010-12-09 NOTE — Assessment & Plan Note (Signed)
Summary: sleep consult/hfu/mhh   Copy to:  Cindy Cervantes Primary Provider/Referring Provider:  Dr. Murray Hodgkins  CC:  Sleep consult, pt c/o fatigue during the day, and naps at leat 2-3 times a day.  History of Present Illness: 77 yo female for sleep evaluation.  She has been using oxygen at 3 lites 24/7.  She is sleepy all day.    She goes to bed at 10pm.  She falls asleep quickly.  She wakes up to use the bathroom a few times per night.  She gets out of bed at 630am.  She feesl tired, but does not get headaches.  Seh falls asleep easily while watching TV.  She is not using anything to help her sleep, or stay awake.  She usually takes a nap during the day, but will still feel tired.  She will talk in her sleep, and clench her teeth.  She denies sleep walking, nightmares, or restless legs.  There is no history of sleep hallucinations, sleep paralysis, or cataplexy.  She gets a dry mouth at night.  She does snore, and has a funny breathing pattern when asleep.  She is on medication for depression.  She denies history of thyroid disease.  Her weight fluctuates depending on how much swelling she has.  She was previously seen by Dr. Sherene Sires for dyspnea.  She has chronic right hemidiaphragm elevation, but SNIFF test from 10/28/10 showed symmetric diaphragm excursion.  CT chest from 10/27/10 showed tracheobronchomalacia.  She has diastolic dysfunction.  There is no history of thrombo-embolic disease, and she is on coumadin for a. fib.  Recent PFT's were not really consistent with COPD.  She does have a remote history of tobacco use.  She is obese, and rather sedintary.   Preventive Screening-Counseling & Management  Alcohol-Tobacco     Packs/Day: 0.5     Year Quit: 1984  Current Medications (verified): 1)  Warfarin Sodium 4 Mg Tabs (Warfarin Sodium) .... Take 1 By Mouth At Bedtime 2)  Niaspan 500 Mg Cr-Tabs (Niacin (Antihyperlipidemic)) .... Take 1 Tab By Mouth Daily 3)  Magnesium Oxide .Marland Kitchen.. 1 Po  Daily 4)  Prilosec Otc 20 Mg Tbec (Omeprazole Magnesium) .Marland Kitchen.. 1 Cap By Mouth Daily 5)  Sertraline Hcl 25 Mg Tabs (Sertraline Hcl) .... Take 1 Tab By Mouth Daily 6)  Pindolol 5 Mg Tabs (Pindolol) .Marland Kitchen.. 1 Tab Bid 7)  Amiodarone Hcl 200 Mg Tabs (Amiodarone Hcl) .... Take 2 Tabs Two Times A Day 8)  Diltiazem Hcl Er Beads 120 Mg Xr24h-Cap (Diltiazem Hcl Er Beads) .... Take One Capsule By Mouth Daily 9)  Tylenol 325 Mg Tabs (Acetaminophen) .... As Needed 10)  Torsemide 10 Mg Tabs (Torsemide) .... 2 Tab in The Am and 1/2 Tab of The 10 Mg in The Afternoon 11)  Potassium Chloride Crys Cr 20 Meq Cr-Tabs (Potassium Chloride Crys Cr) .Marland Kitchen.. 1 Tab Two Times A Day 12)  Miralx .... Prn 13)  02 .... Set At 3 Liters 24 Hours Daily  Allergies (verified): 1)  ! Morphine 2)  ! Toviaz (Fesoterodine Fumarate)  Past History:  Past Medical History: Parox AFib with RVR   a.  Flecainide Rx started 09/2010. . . . changed to Amiodarone 1/25-30/2012 admission   b.  Coumadin (CHADS2=2)   c. s/p pacer 10/2010 2/2 tachy-brady Chronic Diastolic CHF   a. Echo 09/2010: EF 65-70% h/o Hyperkalemia in setting of spironolactone 09/2010 URINARY INCONTINENCE (ICD-788.30) OSTEOARTHRITIS (ICD-715.90) C O P D (ICD-496)  pft's rec...................................Marland KitchenWert    -  PFT's March 29 2010 >> FEV1 1.34 (72) with ratio 75 and ERV 42, DLC0 55 > corrects to 137 OBESITY     - HC03 36  Feb 11 2010  ? OHS ALLERGIC RHINITIS (ICD-477.9) HYPERTENSION (ICD-401.9) UNSPECIFIED SENILE CATARACT (ICD-366.10) HYPERLIPIDEMIA (ICD-272.4) XEROSTOMIA (ICD-527.7) DYSPHAGIA UNSPECIFIED (ICD-787.20) COUGH (ICD-786.2)      - Better on gerd rx March 08, 2010      - Sinus ct March 08, 2010 >> Normal except for opacification of a few air cells of the frontoethmoid junction on                                                  the left ? significance. OBSTRUCTIVE SLEEP APNEA (ICD-327.23) DIVERTICULOSIS, COLON (ICD-562.10) DYSPNEA (ICD-786.05)      - No desat walking March 08, 2010      - CT chest March 09 2010 >>> nl lung but abn liver > deferred w/u to Dr Chilton Si PERSONAL HISTORY OF COLONIC POLYPS (ICD-V12.72) Renal Insufficiency  Past Surgical History: Reviewed history from 02/10/2010 and no changes required. 1964-Tubal ligation 1999-Right TKR 2000-Tummy Tuck: "almost died"; was in respiratory distress. Became delirious 2003-Left TKR 2008-Cataract Extraction   Ortho: [facility] Ophth:[facility] Derm: [facility] Dentist: [facility]  Family History: Reviewed history from 02/10/2010 and no changes required. Father deceased age 44 heart disease. Mother deceased age 56 natural causes  Social History: Reviewed history from 02/11/2010 and no changes required. Marital Status: Widowed. 2002 (married 1955). Originally from [place], [place]. Pt lives in a single level home at [employer] ([place] since 09/2008. Moved from [place], [place] Children: yes 4 Occupation: retired Patient states former smoker.  Quit 1984 1/2ppd  Review of Systems       The patient complains of shortness of breath with activity, shortness of breath at rest, productive cough, irregular heartbeats, acid heartburn, indigestion, weight change, nasal congestion/difficulty breathing through nose, sneezing, hand/feet swelling, and joint stiffness or pain.  The patient denies non-productive cough, coughing up blood, chest pain, loss of appetite, abdominal pain, difficulty swallowing, sore throat, tooth/dental problems, headaches, itching, ear ache, anxiety, depression, rash, change in color of mucus, and fever.    Vital Signs:  Patient profile:   77 year old female Height:      64 inches Weight:      217 pounds BMI:     37.38 O2 Sat:      95 % on 3 L/min Temp:     97.4 degrees F oral Pulse rate:   59 / minute BP sitting:   120 / 64  (left arm)  Vitals Entered By: Renold Genta RCP, LPN (November 19, 2010 2:50 PM)  O2 Flow:  3 L/min CC: Sleep consult, pt c/o  fatigue during the day,naps at leat 2-3 times a day Comments Medications reviewed with patient Renold Genta RCP, LPN  November 19, 2010 3:01 PM    Physical Exam  General:  on supplemental oxygen and obese.   Eyes:  PERRLA and EOMI.   Nose:  no deformity, discharge, inflammation, or lesions Mouth:  MP 3, no exudate Neck:  no JVD.   Lungs:  decreased breath sounds right base, no wheeze/rales on left Heart:  regular rhythm, normal rate, and no murmurs.   Abdomen:  bowel sounds positive; abdomen soft and non-tender without masses, or organomegaly Extremities:  1+ ankle edema Neurologic:  normal CN II-XII.   Cervical Nodes:  no significant adenopathy Psych:  alert and cooperative; normal mood and affect; normal attention span and concentration   Impression & Recommendations:  Problem # 1:  OBSTRUCTIVE SLEEP APNEA (ICD-327.23) She has sleep disruption, daytime sleepiness, and snoring.  She has history of diastolic dysfunction and atrial fibrillation.  She is on chronic oxygen therapy.  I am concerned that she could have sleep apnea and sleep related hypoventilation.  To further assess will arrange for a sleep test.  I reviewed how sleep apnea can affect her health.  Driving precautions were discussed.  Explained the importance of weight loss.  Problem # 2:  DYSPNEA (ICD-786.05) Multifactorial.  She has diastolic dysfuntion, obesity with deconditioning and hypoventilation, chronic right hemidiaphragm elevation, and tracheobronchomalacia on recent CT chest.  She has prior history of tobacco abuse, but PFT's were more suggestive of restrictive rather than obstructive defect.  Will re-assess after her sleep study.  By then she will likely be on CPAP/BPAP with oxygen.    Problem # 3:  COUGH (ICD-786.2) Likely related to GERD.  She is to continue with PPI therapy.  Complete Medication List: 1)  Diltiazem Hcl Er Beads 120 Mg Xr24h-cap (Diltiazem hcl er beads) .... Take one capsule by mouth  daily 2)  Pindolol 5 Mg Tabs (Pindolol) .Marland Kitchen.. 1 tab bid 3)  Amiodarone Hcl 200 Mg Tabs (Amiodarone hcl) .... Take 2 tabs once daily 4)  Warfarin Sodium 4 Mg Tabs (Warfarin sodium) .... As directed 5)  Torsemide 10 Mg Tabs (Torsemide) .... 2 tab in the am and 1/2 tab of the 10 mg in the afternoon 6)  Potassium Chloride Crys Cr 20 Meq Cr-tabs (Potassium chloride crys cr) .Marland Kitchen.. 1 tab two times a day 7)  Niaspan 500 Mg Cr-tabs (Niacin (antihyperlipidemic)) .... Take 1 tab by mouth daily 8)  Sertraline Hcl 25 Mg Tabs (Sertraline hcl) .... Take 1 tab by mouth daily 9)  Prilosec Otc 20 Mg Tbec (Omeprazole magnesium) .Marland Kitchen.. 1 cap by mouth daily 10)  Miralx  .... Prn 11)  Magnesium Oxide  .Marland Kitchen.. 1 po daily 12)  Tylenol 325 Mg Tabs (Acetaminophen) .... As needed 13)  02  .... Set at 3 liters 24 hours daily  Other Orders: Consultation Level IV (98119) Sleep Study (Sleep Study)  Patient Instructions: 1)  Will arrange for sleep test  2)  Will call to schedule follow up after sleep test reviewed

## 2010-12-09 NOTE — Letter (Signed)
Summary: Med List  Med List   Imported By: Marylou Mccoy 11/29/2010 16:33:57  _____________________________________________________________________  External Attachment:    Type:   Image     Comment:   External Document

## 2010-12-13 LAB — CBC
HCT: 29.6 % — ABNORMAL LOW (ref 36.0–46.0)
MCV: 100 fL (ref 78.0–100.0)
MCV: 100.3 fL — ABNORMAL HIGH (ref 78.0–100.0)
Platelets: 267 10*3/uL (ref 150–400)
RBC: 2.95 MIL/uL — ABNORMAL LOW (ref 3.87–5.11)
RBC: 3.2 MIL/uL — ABNORMAL LOW (ref 3.87–5.11)
WBC: 11 10*3/uL — ABNORMAL HIGH (ref 4.0–10.5)
WBC: 8.6 10*3/uL (ref 4.0–10.5)

## 2010-12-13 LAB — DIFFERENTIAL
Basophils Absolute: 0 10*3/uL (ref 0.0–0.1)
Eosinophils Relative: 1 % (ref 0–5)
Eosinophils Relative: 3 % (ref 0–5)
Lymphocytes Relative: 16 % (ref 12–46)
Lymphocytes Relative: 9 % — ABNORMAL LOW (ref 12–46)
Lymphs Abs: 1 10*3/uL (ref 0.7–4.0)
Lymphs Abs: 1.4 10*3/uL (ref 0.7–4.0)
Monocytes Absolute: 0.6 10*3/uL (ref 0.1–1.0)
Monocytes Absolute: 0.9 10*3/uL (ref 0.1–1.0)
Monocytes Relative: 7 % (ref 3–12)
Neutro Abs: 6.4 10*3/uL (ref 1.7–7.7)

## 2010-12-13 LAB — BASIC METABOLIC PANEL
BUN: 19 mg/dL (ref 6–23)
BUN: 19 mg/dL (ref 6–23)
BUN: 21 mg/dL (ref 6–23)
BUN: 27 mg/dL — ABNORMAL HIGH (ref 6–23)
CO2: 33 mEq/L — ABNORMAL HIGH (ref 19–32)
CO2: 37 mEq/L — ABNORMAL HIGH (ref 19–32)
Calcium: 8.5 mg/dL (ref 8.4–10.5)
Calcium: 8.7 mg/dL (ref 8.4–10.5)
Chloride: 90 mEq/L — ABNORMAL LOW (ref 96–112)
Chloride: 93 mEq/L — ABNORMAL LOW (ref 96–112)
Chloride: 95 mEq/L — ABNORMAL LOW (ref 96–112)
Creatinine, Ser: 1.03 mg/dL (ref 0.4–1.2)
Creatinine, Ser: 1.05 mg/dL (ref 0.4–1.2)
Creatinine, Ser: 1.2 mg/dL (ref 0.4–1.2)
GFR calc Af Amer: 43 mL/min — ABNORMAL LOW (ref >60)
GFR calc Af Amer: 53 mL/min — ABNORMAL LOW (ref >60)
GFR calc Af Amer: >60 mL/min (ref >60)
GFR calc non Af Amer: 38 mL/min — ABNORMAL LOW (ref >60)
GFR calc non Af Amer: 39 mL/min — ABNORMAL LOW (ref >60)
GFR calc non Af Amer: 44 mL/min — ABNORMAL LOW (ref >60)
GFR calc non Af Amer: 51 mL/min — ABNORMAL LOW (ref >60)
Glucose, Bld: 102 mg/dL — ABNORMAL HIGH (ref 70–99)
Glucose, Bld: 134 mg/dL — ABNORMAL HIGH (ref 70–99)
Glucose, Bld: 184 mg/dL — ABNORMAL HIGH (ref 70–99)
Potassium: 2.9 mEq/L — ABNORMAL LOW (ref 3.5–5.1)
Potassium: 3.4 mEq/L — ABNORMAL LOW (ref 3.5–5.1)
Potassium: 5 mEq/L (ref 3.5–5.1)
Potassium: 6 mEq/L — ABNORMAL HIGH (ref 3.5–5.1)
Sodium: 133 mEq/L — ABNORMAL LOW (ref 135–145)
Sodium: 136 mEq/L (ref 135–145)

## 2010-12-13 LAB — COMPREHENSIVE METABOLIC PANEL
AST: 65 U/L — ABNORMAL HIGH (ref 0–37)
Albumin: 2.7 g/dL — ABNORMAL LOW (ref 3.5–5.2)
BUN: 18 mg/dL (ref 6–23)
Calcium: 8.9 mg/dL (ref 8.4–10.5)
Chloride: 98 mEq/L (ref 96–112)
Creatinine, Ser: 0.85 mg/dL (ref 0.4–1.2)
GFR calc Af Amer: >60 mL/min (ref >60)
Total Protein: 6.4 g/dL (ref 6.0–8.3)

## 2010-12-13 LAB — URINALYSIS, ROUTINE W REFLEX MICROSCOPIC
Glucose, UA: NEGATIVE mg/dL
Glucose, UA: NEGATIVE mg/dL
Hgb urine dipstick: NEGATIVE
Ketones, ur: NEGATIVE mg/dL
Nitrite: NEGATIVE
Specific Gravity, Urine: 1.021 (ref 1.005–1.030)
Urobilinogen, UA: 4 mg/dL — ABNORMAL HIGH (ref 0.0–1.0)
pH: 6 (ref 5.0–8.0)

## 2010-12-13 LAB — CK TOTAL AND CKMB (NOT AT ARMC)
CK, MB: 0.9 ng/mL (ref 0.3–4.0)
Total CK: 24 U/L (ref 7–177)

## 2010-12-13 LAB — PROTIME-INR
INR: 2.36 — ABNORMAL HIGH (ref 0.00–1.49)
INR: 2.4 — ABNORMAL HIGH (ref 0.00–1.49)
Prothrombin Time: 24.1 seconds — ABNORMAL HIGH (ref 11.6–15.2)
Prothrombin Time: 24.9 seconds — ABNORMAL HIGH (ref 11.6–15.2)
Prothrombin Time: 25.9 seconds — ABNORMAL HIGH (ref 11.6–15.2)
Prothrombin Time: 26.3 seconds — ABNORMAL HIGH (ref 11.6–15.2)

## 2010-12-13 LAB — APTT: aPTT: 36 seconds (ref 24–37)

## 2010-12-13 LAB — CARDIAC PANEL(CRET KIN+CKTOT+MB+TROPI)
Relative Index: INVALID (ref 0.0–2.5)
Relative Index: INVALID (ref 0.0–2.5)
Total CK: 28 U/L (ref 7–177)

## 2010-12-13 LAB — POCT CARDIAC MARKERS
CKMB, poc: 1.1 ng/mL (ref 1.0–8.0)
Troponin i, poc: <0.05 ng/mL (ref 0.00–0.09)

## 2010-12-13 LAB — URINE MICROSCOPIC-ADD ON

## 2010-12-13 LAB — MAGNESIUM: Magnesium: 1.7 mg/dL (ref 1.5–2.5)

## 2010-12-13 LAB — TROPONIN I: Troponin I: 0.05 ng/mL (ref 0.00–0.06)

## 2010-12-14 ENCOUNTER — Ambulatory Visit (HOSPITAL_BASED_OUTPATIENT_CLINIC_OR_DEPARTMENT_OTHER): Payer: Medicare Other | Attending: Pulmonary Disease

## 2010-12-14 ENCOUNTER — Encounter: Payer: Self-pay | Admitting: Pulmonary Disease

## 2010-12-14 DIAGNOSIS — J449 Chronic obstructive pulmonary disease, unspecified: Secondary | ICD-10-CM | POA: Insufficient documentation

## 2010-12-14 DIAGNOSIS — Z7901 Long term (current) use of anticoagulants: Secondary | ICD-10-CM | POA: Insufficient documentation

## 2010-12-14 DIAGNOSIS — J398 Other specified diseases of upper respiratory tract: Secondary | ICD-10-CM | POA: Insufficient documentation

## 2010-12-14 DIAGNOSIS — G4736 Sleep related hypoventilation in conditions classified elsewhere: Secondary | ICD-10-CM | POA: Insufficient documentation

## 2010-12-14 DIAGNOSIS — R0902 Hypoxemia: Secondary | ICD-10-CM | POA: Insufficient documentation

## 2010-12-14 DIAGNOSIS — I519 Heart disease, unspecified: Secondary | ICD-10-CM | POA: Insufficient documentation

## 2010-12-14 DIAGNOSIS — I4891 Unspecified atrial fibrillation: Secondary | ICD-10-CM | POA: Insufficient documentation

## 2010-12-14 DIAGNOSIS — I10 Essential (primary) hypertension: Secondary | ICD-10-CM | POA: Insufficient documentation

## 2010-12-14 DIAGNOSIS — Z79899 Other long term (current) drug therapy: Secondary | ICD-10-CM | POA: Insufficient documentation

## 2010-12-14 DIAGNOSIS — G4733 Obstructive sleep apnea (adult) (pediatric): Secondary | ICD-10-CM | POA: Insufficient documentation

## 2010-12-14 DIAGNOSIS — J4489 Other specified chronic obstructive pulmonary disease: Secondary | ICD-10-CM | POA: Insufficient documentation

## 2010-12-14 LAB — DIFFERENTIAL
Eosinophils Absolute: 0.1 10*3/uL (ref 0.0–0.7)
Eosinophils Relative: 1 % (ref 0–5)
Lymphs Abs: 2.4 10*3/uL (ref 0.7–4.0)
Monocytes Absolute: 0.7 10*3/uL (ref 0.1–1.0)
Monocytes Relative: 7 % (ref 3–12)

## 2010-12-14 LAB — CARDIAC PANEL(CRET KIN+CKTOT+MB+TROPI)
CK, MB: 0.9 ng/mL (ref 0.3–4.0)
CK, MB: 1.4 ng/mL (ref 0.3–4.0)
CK, MB: 1.7 ng/mL (ref 0.3–4.0)
Relative Index: INVALID (ref 0.0–2.5)
Relative Index: INVALID (ref 0.0–2.5)
Total CK: 49 U/L (ref 7–177)
Troponin I: 0.02 ng/mL (ref 0.00–0.06)
Troponin I: 0.04 ng/mL (ref 0.00–0.06)
Troponin I: 0.05 ng/mL (ref 0.00–0.06)

## 2010-12-14 LAB — BASIC METABOLIC PANEL
BUN: 10 mg/dL (ref 6–23)
BUN: 10 mg/dL (ref 6–23)
Calcium: 8.3 mg/dL — ABNORMAL LOW (ref 8.4–10.5)
Calcium: 8.3 mg/dL — ABNORMAL LOW (ref 8.4–10.5)
Chloride: 100 mEq/L (ref 96–112)
Chloride: 100 mEq/L (ref 96–112)
Chloride: 101 mEq/L (ref 96–112)
Creatinine, Ser: 0.97 mg/dL (ref 0.4–1.2)
Creatinine, Ser: 1.09 mg/dL (ref 0.4–1.2)
GFR calc Af Amer: >60 mL/min (ref >60)
GFR calc Af Amer: >60 mL/min (ref >60)
GFR calc non Af Amer: 55 mL/min — ABNORMAL LOW (ref >60)
GFR calc non Af Amer: 56 mL/min — ABNORMAL LOW (ref >60)
Potassium: 3.5 mEq/L (ref 3.5–5.1)

## 2010-12-14 LAB — COMPREHENSIVE METABOLIC PANEL
Alkaline Phosphatase: 168 U/L — ABNORMAL HIGH (ref 39–117)
BUN: 11 mg/dL (ref 6–23)
CO2: 23 mEq/L (ref 19–32)
Chloride: 100 mEq/L (ref 96–112)
Creatinine, Ser: 1.02 mg/dL (ref 0.4–1.2)
GFR calc non Af Amer: 53 mL/min — ABNORMAL LOW (ref >60)
Glucose, Bld: 119 mg/dL — ABNORMAL HIGH (ref 70–99)
Potassium: 3.4 mEq/L — ABNORMAL LOW (ref 3.5–5.1)
Total Bilirubin: 0.9 mg/dL (ref 0.3–1.2)

## 2010-12-14 LAB — PROTIME-INR
INR: 1.69 — ABNORMAL HIGH (ref 0.00–1.49)
INR: 1.83 — ABNORMAL HIGH (ref 0.00–1.49)
INR: 1.95 — ABNORMAL HIGH (ref 0.00–1.49)
INR: 2.37 — ABNORMAL HIGH (ref 0.00–1.49)
Prothrombin Time: 21.3 s — ABNORMAL HIGH (ref 11.6–15.2)
Prothrombin Time: 22.4 seconds — ABNORMAL HIGH (ref 11.6–15.2)
Prothrombin Time: 24.6 seconds — ABNORMAL HIGH (ref 11.6–15.2)
Prothrombin Time: 26 seconds — ABNORMAL HIGH (ref 11.6–15.2)
Prothrombin Time: 27.2 seconds — ABNORMAL HIGH (ref 11.6–15.2)

## 2010-12-14 LAB — APTT
aPTT: 194 seconds — ABNORMAL HIGH (ref 24–37)
aPTT: >200 seconds (ref 24–37)

## 2010-12-14 LAB — CBC
HCT: 33.1 % — ABNORMAL LOW (ref 36.0–46.0)
HCT: 33.5 % — ABNORMAL LOW (ref 36.0–46.0)
HCT: 39.1 % (ref 36.0–46.0)
Hemoglobin: 11.1 g/dL — ABNORMAL LOW (ref 12.0–15.0)
MCH: 32 pg (ref 26.0–34.0)
MCH: 32.6 pg (ref 26.0–34.0)
MCH: 32.8 pg (ref 26.0–34.0)
MCHC: 33.1 g/dL (ref 30.0–36.0)
MCHC: 33.8 g/dL (ref 30.0–36.0)
MCV: 96.5 fL (ref 78.0–100.0)
MCV: 96.5 fL (ref 78.0–100.0)
MCV: 96.9 fL (ref 78.0–100.0)
MCV: 97.4 fL (ref 78.0–100.0)
MCV: 98 fL (ref 78.0–100.0)
Platelets: 117 10*3/uL — ABNORMAL LOW (ref 150–400)
Platelets: 126 10*3/uL — ABNORMAL LOW (ref 150–400)
Platelets: 132 10*3/uL — ABNORMAL LOW (ref 150–400)
Platelets: 147 10*3/uL — ABNORMAL LOW (ref 150–400)
Platelets: 160 10*3/uL (ref 150–400)
Platelets: 189 10*3/uL (ref 150–400)
RBC: 3.19 MIL/uL — ABNORMAL LOW (ref 3.87–5.11)
RBC: 3.25 MIL/uL — ABNORMAL LOW (ref 3.87–5.11)
RBC: 3.47 MIL/uL — ABNORMAL LOW (ref 3.87–5.11)
RBC: 3.99 MIL/uL (ref 3.87–5.11)
RDW: 14.4 % (ref 11.5–15.5)
RDW: 14.4 % (ref 11.5–15.5)
RDW: 14.5 % (ref 11.5–15.5)
RDW: 14.8 % (ref 11.5–15.5)
WBC: 6.5 10*3/uL (ref 4.0–10.5)
WBC: 6.9 10*3/uL (ref 4.0–10.5)
WBC: 7.6 K/uL (ref 4.0–10.5)
WBC: 7.7 10*3/uL (ref 4.0–10.5)
WBC: 8.8 10*3/uL (ref 4.0–10.5)

## 2010-12-14 LAB — POCT I-STAT, CHEM 8
BUN: 12 mg/dL (ref 6–23)
Calcium, Ion: 1.01 mmol/L — ABNORMAL LOW (ref 1.12–1.32)
Chloride: 96 mEq/L (ref 96–112)
Creatinine, Ser: 1.1 mg/dL (ref 0.4–1.2)
Glucose, Bld: 139 mg/dL — ABNORMAL HIGH (ref 70–99)
HCT: 41 % (ref 36.0–46.0)
Hemoglobin: 13.9 g/dL (ref 12.0–15.0)
Potassium: 2.9 meq/L — ABNORMAL LOW (ref 3.5–5.1)
Sodium: 135 meq/L (ref 135–145)
TCO2: 28 mmol/L (ref 0–100)

## 2010-12-14 LAB — LIPID PANEL
Cholesterol: 106 mg/dL (ref 0–200)
LDL Cholesterol: 44 mg/dL (ref 0–99)
Total CHOL/HDL Ratio: 2.2 RATIO
VLDL: 14 mg/dL (ref 0–40)

## 2010-12-14 LAB — IRON AND TIBC
Iron: 40 ug/dL — ABNORMAL LOW (ref 42–135)
Saturation Ratios: 23 % (ref 20–55)

## 2010-12-14 LAB — POCT CARDIAC MARKERS
CKMB, poc: <1 ng/mL — ABNORMAL LOW (ref 1.0–8.0)
Myoglobin, poc: 85.6 ng/mL (ref 12–200)
Troponin i, poc: <0.05 ng/mL (ref 0.00–0.09)

## 2010-12-14 LAB — VITAMIN B12: Vitamin B-12: 758 pg/mL (ref 211–911)

## 2010-12-14 LAB — T4, FREE: Free T4: 1.8 ng/dL (ref 0.80–1.80)

## 2010-12-14 NOTE — Progress Notes (Signed)
Summary: bnp drawn today- c/o wt gain 3 lb  Phone Note From Other Clinic   Caller: marybeth from [facility] office [phone] assistance living 1.  Request: Talk with Nurse Summary of Call: bnp was drawn today. office will fax lab  over. c/o wt gain. 3 lb. fax [phone].  Initial call taken by: Lorne Skeens,  December 07, 2010 12:36 PM  Follow-up for Phone Call        Facility is aware now appt with Dr. Graciela Husbands on 12/20/10 at 2:25pm  Follow-up by: Lisabeth Devoid RN,  December 09, 2010 4:34 PM

## 2010-12-16 DIAGNOSIS — J449 Chronic obstructive pulmonary disease, unspecified: Secondary | ICD-10-CM

## 2010-12-16 DIAGNOSIS — G4733 Obstructive sleep apnea (adult) (pediatric): Secondary | ICD-10-CM

## 2010-12-16 DIAGNOSIS — I519 Heart disease, unspecified: Secondary | ICD-10-CM

## 2010-12-16 DIAGNOSIS — I10 Essential (primary) hypertension: Secondary | ICD-10-CM

## 2010-12-16 DIAGNOSIS — J4489 Other specified chronic obstructive pulmonary disease: Secondary | ICD-10-CM

## 2010-12-20 ENCOUNTER — Encounter: Payer: Medicare Other | Admitting: Internal Medicine

## 2010-12-21 ENCOUNTER — Telehealth: Payer: Self-pay | Admitting: Pulmonary Disease

## 2010-12-21 NOTE — Telephone Encounter (Signed)
Spoke with Corrie Dandy at [facility] and verified msg.  Copy of PSG was faxed to her at the number provided.

## 2010-12-21 NOTE — Medication Information (Signed)
Summary: Physician's Lab Orders   Physician's Lab Orders   Imported By: Roderic Ovens 12/09/2010 11:32:31  _____________________________________________________________________  External Attachment:    Type:   Image     Comment:   External Document

## 2010-12-21 NOTE — Letter (Signed)
Summary: [facility]: New Pt Evaluation  [facility]: New Pt Evaluation   Imported By: Earl Many 12/10/2010 13:39:21  _____________________________________________________________________  External Attachment:    Type:   Image     Comment:   External Document

## 2010-12-21 NOTE — Letter (Signed)
Summary: Physician's Order's  Physician's Order's   Imported By: Marylou Mccoy 12/14/2010 16:13:03  _____________________________________________________________________  External Attachment:    Type:   Image     Comment:   External Document

## 2010-12-21 NOTE — Miscellaneous (Signed)
Summary: Sleep study  Clinical Lists Changes Split night study.  AHI 17.9, REM 40.8, NR 15, Supine 21.5, NSupine 1.7.  Suboptimal titration and failed CPAP.  Will need to have full night BPAP titration study starting with 2.5 liters oxygen for OSA with OHS.  Results d/w pt's daughter (HPOA) over the phone.  Explained that she has OSA, OHS, and recent CT chest showed tracheo-bronchomalacia.  Will arrange for BPAP titration study and proceed from there. Problems: Added new problem of SLEEP RELATED HYPOVENTILATION/HYPOXEMIA CCE (ICD-327.26) Added new problem of OTHER DISEASES OF TRACHEA AND BRONCHUS (ICD-519.19) Orders: Added new Referral order of Sleep Study (Sleep Study) - Signed

## 2010-12-29 ENCOUNTER — Encounter (HOSPITAL_BASED_OUTPATIENT_CLINIC_OR_DEPARTMENT_OTHER): Payer: Medicare Other | Admitting: Hematology & Oncology

## 2010-12-29 ENCOUNTER — Other Ambulatory Visit: Payer: Self-pay | Admitting: Hematology & Oncology

## 2010-12-29 DIAGNOSIS — N189 Chronic kidney disease, unspecified: Secondary | ICD-10-CM

## 2010-12-29 DIAGNOSIS — D649 Anemia, unspecified: Secondary | ICD-10-CM

## 2010-12-29 DIAGNOSIS — Z7901 Long term (current) use of anticoagulants: Secondary | ICD-10-CM

## 2010-12-29 DIAGNOSIS — D61818 Other pancytopenia: Secondary | ICD-10-CM

## 2010-12-29 LAB — CBC WITH DIFFERENTIAL (CANCER CENTER ONLY)
BASO#: 0 10*3/uL (ref 0.0–0.2)
BASO%: 0.3 % (ref 0.0–2.0)
EOS%: 3.2 % (ref 0.0–7.0)
Eosinophils Absolute: 0.3 10*3/uL (ref 0.0–0.5)
HCT: 31.3 % — ABNORMAL LOW (ref 34.8–46.6)
HGB: 9.5 g/dL — ABNORMAL LOW (ref 11.6–15.9)
LYMPH#: 0.9 10*3/uL (ref 0.9–3.3)
LYMPH%: 11.4 % — ABNORMAL LOW (ref 14.0–48.0)
MCH: 31.4 pg (ref 26.0–34.0)
MCHC: 30.4 g/dL — ABNORMAL LOW (ref 32.0–36.0)
MCV: 103 fL — ABNORMAL HIGH (ref 81–101)
MONO#: 0.7 10*3/uL (ref 0.1–0.9)
MONO%: 9.3 % (ref 0.0–13.0)
NEUT#: 5.9 10*3/uL (ref 1.5–6.5)
NEUT%: 75.8 % (ref 39.6–80.0)
Platelets: 187 10*3/uL (ref 145–400)
RBC: 3.03 10*6/uL — ABNORMAL LOW (ref 3.70–5.32)
RDW: 14.9 % (ref 11.1–15.7)
WBC: 7.8 10*3/uL (ref 3.9–10.0)

## 2010-12-29 LAB — RETICULOCYTES (CHCC)
ABS Retic: 37.6 10*3/uL (ref 19.0–186.0)
RBC.: 3.13 MIL/uL — ABNORMAL LOW (ref 3.87–5.11)
Retic Ct Pct: 1.2 % (ref 0.4–3.1)

## 2010-12-29 LAB — FERRITIN: Ferritin: 76 ng/mL (ref 10–291)

## 2010-12-29 LAB — IRON AND TIBC: Iron: 45 ug/dL (ref 42–145)

## 2011-01-04 ENCOUNTER — Telehealth: Payer: Self-pay | Admitting: Internal Medicine

## 2011-01-04 NOTE — Telephone Encounter (Signed)
Cindy Cervantes states [facility] has fax over paper re pt orders. Cindy Cervantes states they need answer through fax or phone.

## 2011-01-04 NOTE — Telephone Encounter (Signed)
ORDER FAXED TO FACILITY./CY

## 2011-01-07 ENCOUNTER — Encounter (HOSPITAL_BASED_OUTPATIENT_CLINIC_OR_DEPARTMENT_OTHER): Payer: Medicare Other | Admitting: Hematology & Oncology

## 2011-01-07 DIAGNOSIS — N189 Chronic kidney disease, unspecified: Secondary | ICD-10-CM

## 2011-01-07 DIAGNOSIS — D649 Anemia, unspecified: Secondary | ICD-10-CM

## 2011-01-12 ENCOUNTER — Ambulatory Visit (HOSPITAL_BASED_OUTPATIENT_CLINIC_OR_DEPARTMENT_OTHER): Payer: Medicare Other | Attending: Pulmonary Disease

## 2011-01-12 ENCOUNTER — Encounter: Payer: Self-pay | Admitting: Pulmonary Disease

## 2011-01-12 DIAGNOSIS — Z9981 Dependence on supplemental oxygen: Secondary | ICD-10-CM | POA: Insufficient documentation

## 2011-01-12 DIAGNOSIS — G4733 Obstructive sleep apnea (adult) (pediatric): Secondary | ICD-10-CM | POA: Insufficient documentation

## 2011-01-12 DIAGNOSIS — J9610 Chronic respiratory failure, unspecified whether with hypoxia or hypercapnia: Secondary | ICD-10-CM | POA: Insufficient documentation

## 2011-01-12 DIAGNOSIS — Z79899 Other long term (current) drug therapy: Secondary | ICD-10-CM | POA: Insufficient documentation

## 2011-01-12 DIAGNOSIS — I4891 Unspecified atrial fibrillation: Secondary | ICD-10-CM | POA: Insufficient documentation

## 2011-01-14 ENCOUNTER — Encounter (HOSPITAL_BASED_OUTPATIENT_CLINIC_OR_DEPARTMENT_OTHER): Payer: Medicare Other | Admitting: Hematology & Oncology

## 2011-01-14 DIAGNOSIS — D649 Anemia, unspecified: Secondary | ICD-10-CM

## 2011-01-14 DIAGNOSIS — N189 Chronic kidney disease, unspecified: Secondary | ICD-10-CM

## 2011-01-18 ENCOUNTER — Ambulatory Visit (INDEPENDENT_AMBULATORY_CARE_PROVIDER_SITE_OTHER): Payer: Medicare Other | Admitting: Internal Medicine

## 2011-01-18 ENCOUNTER — Encounter: Payer: Self-pay | Admitting: Internal Medicine

## 2011-01-18 DIAGNOSIS — I10 Essential (primary) hypertension: Secondary | ICD-10-CM

## 2011-01-18 DIAGNOSIS — Z79899 Other long term (current) drug therapy: Secondary | ICD-10-CM

## 2011-01-18 DIAGNOSIS — I5032 Chronic diastolic (congestive) heart failure: Secondary | ICD-10-CM

## 2011-01-18 DIAGNOSIS — Z950 Presence of cardiac pacemaker: Secondary | ICD-10-CM | POA: Insufficient documentation

## 2011-01-18 DIAGNOSIS — I509 Heart failure, unspecified: Secondary | ICD-10-CM

## 2011-01-18 DIAGNOSIS — I495 Sick sinus syndrome: Secondary | ICD-10-CM

## 2011-01-18 DIAGNOSIS — I4891 Unspecified atrial fibrillation: Secondary | ICD-10-CM

## 2011-01-18 NOTE — Assessment & Plan Note (Addendum)
The patient's device was interrogated and the information was fully reviewed.  The device was reprogrammed to maximize longevity longevity

## 2011-01-18 NOTE — Patient Instructions (Signed)
Your physician recommends that you schedule a follow-up appointment in: 3 MONTHS WITH DR Graciela Husbands Your physician recommends that you return for lab work in: TODAY  LIVER TSH V58.69 Your physician has recommended you make the following change in your medication: DECREASE AMIODARONE TO 200 MG EVERY DAY STOP DILTIAZEM STOP PINDOLOL

## 2011-01-18 NOTE — Assessment & Plan Note (Signed)
stble

## 2011-01-18 NOTE — Assessment & Plan Note (Signed)
Her blood pressure is currently well controlled. I would hope that with the discontinuation of her beta blocker and her calcium blocker it will stay that way. We'll avoid getting the results from [facility]

## 2011-01-18 NOTE — Assessment & Plan Note (Signed)
Patient is holding sinus rhythm on amiodarone. We will plan to decrease her dose today from 400-200 mg. The family was alerted that there may be an impact on warfarin.  Given the rate control associated with amiodarone, we will discontinue her Visken as well as her diltiazem.

## 2011-01-18 NOTE — Assessment & Plan Note (Addendum)
She is doing much better. She as just trace edema. This may be related to her diltiazem. As there are no neck veins, I think she is probably euvolemic.

## 2011-01-18 NOTE — Progress Notes (Signed)
HPI  Cindy Cervantes is a 77 y.o. female with pacer implant for tachybrady paroxysmal atrial fibrillation and diastolic heart failure. She was treated initially with atenolol but had progressive bradycardia. She then went on amiodarone for control of her atrial fibrillation   A 2-D echocardiogram September 08, 2010:  LVEF 65-70%.    She has doing relatively well. She's had no significant edema. There are some good days and bad some bad days.  She is also undergoing evaluation for her anemia. She has received iron supplementation and Aranesp. She may be considered for bone marrow biopsy.  She is also undergoing evaluation for sleep apnea and the possibility of tracheomalacia.   Past Medical History  Diagnosis Date  . Paroxysmal a-fib with RVR  . History of hyperkalemia in setting of spironolactone 09/2010  . Urinary incontinence   . Osteoarthrosis, unspecified whether generalized or localized, unspecified site   . Chronic airway obstruction, not elsewhere classified   . Obesity   . Allergic rhinitis, cause unspecified   . Unspecified essential hypertension   . Senile cataract, unspecified   . Other and unspecified hyperlipidemia   . Disturbance of salivary secretion   . Dysphagia, unspecified   . Cough   . Obstructive sleep apnea (adult) (pediatric)   . Diverticulosis of colon (without mention of hemorrhage)   . Shortness of breath   . Personal history of colonic polyps     Past Surgical History  Procedure Date  . Tubal ligation 1964  . Total knee arthroplasty 1999    right knee  . Tummy tuck 2000    pt "almost died"; respiratory distress, became delrious  . Total knee arthroplasty 2003    left knee  . Cataract extraction 2008    Current Outpatient Prescriptions  Medication Sig Dispense Refill  . acetaminophen (TYLENOL) 325 MG tablet Take 650 mg by mouth every 6 (six) hours as needed.        Marland Kitchen amiodarone (PACERONE) 200 MG tablet Take 200 mg by mouth 2 (two) times daily.         Marland Kitchen diltiazem (CARDIZEM) 120 MG tablet Take 120 mg by mouth daily.        Marland Kitchen levothyroxine (SYNTHROID, LEVOTHROID) 25 MCG tablet Take 25 mcg by mouth daily.        . magnesium oxide (MAG-OX) 400 MG tablet Take 400 mg by mouth daily. 1 tab daily       . niacin (NIASPAN) 500 MG CR tablet Take 500 mg by mouth at bedtime. 1 tab qhs       . omeprazole (PRILOSEC) 20 MG capsule Take 20 mg by mouth daily.        . pindolol (VISKEN) 5 MG tablet Take 5 mg by mouth 2 (two) times daily.       . potassium chloride SA (K-DUR,KLOR-CON) 20 MEQ tablet Take 20 mEq by mouth daily.        . sertraline (ZOLOFT) 25 MG tablet Take 25 mg by mouth daily. 1 tab qd       . torsemide (DEMADEX) 20 MG tablet Take 20 mg by mouth daily.        Marland Kitchen warfarin (COUMADIN) 4 MG tablet Take 4 mg by mouth at bedtime. 1 tab qhs as directed by coumadin clinic       . DISCONTD: famotidine (PEPCID) 20 MG tablet Take 20 mg by mouth at bedtime. 1 tab qhs       . DISCONTD: flecainide (TAMBOCOR) 100 MG tablet Take 100  mg by mouth 2 (two) times daily. 1 tab bid        . DISCONTD: furosemide (LASIX) 20 MG tablet Take 20 mg by mouth daily. 1 tab daily       . DISCONTD: omeprazole (PRILOSEC OTC) 20 MG tablet Take 20 mg by mouth daily. 1 cap qd          Allergies  Allergen Reactions  . Iron     By infusion  . Morphine     REACTION: hallucinations  . Toviaz (Fesoterodine Fumarate)     Review of Systems negative except from HPI and PMH  Physical Exam Well developed and well nourished in no acute distress wearing oxygen  HENT normal E scleral and icterus clear Neck Supple JVP flat; carotids brisk and full Clear to ausculation Pocket well healed Regular rate and rhythm, no murmurs gallops or rub Soft with active bowel sounds No clubbing cyanosis and edema Alert and oriented, grossly normal motor and sensory function Skin Warm and Dry     Assessment and  Plan

## 2011-01-19 ENCOUNTER — Other Ambulatory Visit: Payer: Self-pay | Admitting: Hematology & Oncology

## 2011-01-19 ENCOUNTER — Encounter (HOSPITAL_BASED_OUTPATIENT_CLINIC_OR_DEPARTMENT_OTHER): Payer: Medicare Other | Admitting: Hematology & Oncology

## 2011-01-19 DIAGNOSIS — Z7901 Long term (current) use of anticoagulants: Secondary | ICD-10-CM

## 2011-01-19 DIAGNOSIS — D649 Anemia, unspecified: Secondary | ICD-10-CM

## 2011-01-19 DIAGNOSIS — D61818 Other pancytopenia: Secondary | ICD-10-CM

## 2011-01-19 DIAGNOSIS — N189 Chronic kidney disease, unspecified: Secondary | ICD-10-CM

## 2011-01-19 LAB — HEPATIC FUNCTION PANEL
ALT: 22 U/L (ref 0–35)
AST: 26 U/L (ref 0–37)
Total Bilirubin: 0.7 mg/dL (ref 0.3–1.2)

## 2011-01-19 LAB — TSH: TSH: 3.37 u[IU]/mL (ref 0.35–5.50)

## 2011-01-19 LAB — CBC WITH DIFFERENTIAL (CANCER CENTER ONLY)
BASO%: 0.2 % (ref 0.0–2.0)
EOS%: 4.2 % (ref 0.0–7.0)
HCT: 36.6 % (ref 34.8–46.6)
LYMPH#: 1.1 10*3/uL (ref 0.9–3.3)
LYMPH%: 13.8 % — ABNORMAL LOW (ref 14.0–48.0)
MCHC: 30.9 g/dL — ABNORMAL LOW (ref 32.0–36.0)
NEUT%: 71.8 % (ref 39.6–80.0)
RDW: 15.4 % (ref 11.1–15.7)

## 2011-01-19 LAB — IRON AND TIBC
%SAT: 36 % (ref 20–55)
Iron: 112 ug/dL (ref 42–145)
UIBC: 202 ug/dL

## 2011-01-19 LAB — FERRITIN: Ferritin: 864 ng/mL — ABNORMAL HIGH (ref 10–291)

## 2011-01-21 NOTE — Procedures (Addendum)
NAME:  Cindy Cervantes, Cindy Cervantes NO.:  [id number]  MEDICAL RECORD NO.:  [id number]         PATIENT TYPE:  OUT  LOCATION:  SLEEP CENTER                 FACILITY[facility]  [facility]  PHYSICIAN:  Coralyn Helling, MD        DATE OF BIRTH:  [birth year]  DATE OF STUDY:  01/12/2011                           NOCTURNAL POLYSOMNOGRAM  REFERRING PHYSICIAN:  Coralyn Helling, MD  INDICATIONS:  Mr. Ask is a 77 year old female who has a history of atrial fibrillation, diastolic congestive heart failure, diaphragmatic dysfunction, tracheobronchomalacia, and chronic hypoxemic respiratory failure.  She underwent a sleep study on December 14, 2010, and was found to have moderate obstructive sleep apnea with an apnea/hypopnea index of 18.  She had a suboptimal portion of the CPAP titration.  She is therefore referred back to Sleep Lab for a full night CPAP titration study.  Height is 5 feet 4 inches, weight is 199 pounds.  BMI is 34.  Neck size is 13 inches.  Epworth score is 15.  MEDICATIONS:  Warfarin, Niaspan, magnesium, Prilosec, sertraline, pindolol, amiodarone, diltiazem, Tylenol, furosemide, potassium, MiraLax and supplemental oxygen at 2.53 liters continuously.  SLEEP ARCHITECTURE:  Total recording time was 410 minutes.  Total sleep time was 375 minutes.  Sleep efficiency was 91%.  Sleep latency was 3 minutes.  REM latency was 63 minutes.  The patient was observed in all stages of sleep and she slept in both the supine and nonsupine positions.  RESPIRATORY DATA:  The  average respiratory rate was 16.  The patient was started on BiPAP of 8/4 and titrated up to 18/13.  With BiPAP set at 18/13.  The apnea-hypopnea index was reduced to zero.  At this pressure setting, the patient was observed in REM sleep and supine sleep.  OXYGEN DATA:  The baseline oxygenation was 94%.  The oxygen saturation nadir was 71%.  The study was started with the patient using 2.5 liters of supplemental oxygen.  This was  increased to 3 liters of supplemental oxygen.  The combination of BiPAP at 18/13 and 3 liters supplemental oxygen, the patient was able to maintain her oxygen saturation greater than 90%.  CARDIAC DATA:  The average heart rate was 60 and the rhythm strip showed sinus rhythm with occasional PVCs.  MOVEMENT/PARASOMNIA:  The patient had one restroom trip and the periodic limb movement index was zero.  IMPRESSION:  This was a successful BiPAP titration study.  The patient had good control of her respiratory events and oxygenation with a combination of BiPAP of 18/13 cm of water with 3 liters supplemental oxygen.  I recommended that the patient will be started on the above combination and monitored for her clinical response     Coralyn Helling, MD Diplomat, American Board of Sleep Medicine Electronically Signed    VS/MEDQ  D:  01/20/2011 10:51:52  T:  01/20/2011 23:56:16  Job:  [id number]

## 2011-01-24 DIAGNOSIS — G4733 Obstructive sleep apnea (adult) (pediatric): Secondary | ICD-10-CM

## 2011-01-24 DIAGNOSIS — J9610 Chronic respiratory failure, unspecified whether with hypoxia or hypercapnia: Secondary | ICD-10-CM

## 2011-01-24 DIAGNOSIS — I4891 Unspecified atrial fibrillation: Secondary | ICD-10-CM

## 2011-01-24 DIAGNOSIS — Z9981 Dependence on supplemental oxygen: Secondary | ICD-10-CM

## 2011-01-24 NOTE — Assessment & Plan Note (Signed)
She did well with BPAP 18/13 with 3 liters oxygen.  Will arrange for BPAP at home and follow up in office in few weeks after set up.

## 2011-01-25 ENCOUNTER — Telehealth: Payer: Self-pay | Admitting: Pulmonary Disease

## 2011-01-25 NOTE — Telephone Encounter (Signed)
Dr. Craige Cotta please advise if we need to send a order for pt cpap to [facility] in [place], [place]. Thanks  Carver Fila, CMA

## 2011-01-26 NOTE — Telephone Encounter (Signed)
Called and spoke with pt's daughter Cindy Cervantes and she is requesting that we fax the order for Bipap to [facility] in [place], [place]. I advised Cindy Cervantes that I spoke with her mother's nurse at [facility] yesterday and advised that [facility] use one DME company here locally for all the DME Equipment. In case equipment breaks there is a company here locally that can repair or replace equipment that day and wouldn't have to wait on someone to come from [employer] [facility]. Daughter was very insistent that this order is faxed to [facility]. Called [facility] and advised nurse of daughters request. Order was faxed yesterday per nurse request to [facility] and was faxed this morning to [facility] in [place] per daughter's request.

## 2011-01-26 NOTE — Telephone Encounter (Signed)
Please d/w PCC.  Cindy Cervantes has been working on this set up already.

## 2011-01-26 NOTE — Telephone Encounter (Signed)
Spoke w/ Bjorn Loser and she is taking care of this already

## 2011-01-31 ENCOUNTER — Encounter: Payer: Self-pay | Admitting: Pulmonary Disease

## 2011-02-04 ENCOUNTER — Telehealth: Payer: Self-pay | Admitting: Pulmonary Disease

## 2011-02-04 ENCOUNTER — Ambulatory Visit: Payer: Self-pay | Admitting: Cardiology

## 2011-02-04 DIAGNOSIS — G4733 Obstructive sleep apnea (adult) (pediatric): Secondary | ICD-10-CM

## 2011-02-04 NOTE — Telephone Encounter (Signed)
[id number]

## 2011-02-04 NOTE — Telephone Encounter (Signed)
Spoke w/ pt daughter and she states pt was recently set up on bpap 5/1 and states when pt first goes to sleep she feels the pressure is fine but then pt will wakes up in the middle of night and feels like the pressure is to high b/c she has a lot of air coming through the mask. Pt has ramp setting set at every 45 minutes per pt daughter. Please advise Dr. Craige Cotta. Thanks  Carver Fila, CMA

## 2011-02-04 NOTE — Telephone Encounter (Signed)
Verlon Au (daughter) called back and states she forgot to add that pt states she is having a lot of air leaking around her mouth and the noise from that is causing her to wake up. Please advise Dr. Craige Cotta. Thanks

## 2011-02-07 NOTE — Telephone Encounter (Signed)
Will need to have mask fit re-assessed first.  Will send order to DME through [facility].  If this does not fix problem will need to have auto-BPAP titration at home.  Will have my nurse inform patient of plan.

## 2011-02-07 NOTE — Telephone Encounter (Signed)
Spoke w/ Verlon Au and advised her pt will have mask fit re-assessed first then if that doesn't help will need auto-bpap titration but pt to call back if this does not help. Daughter verbalized understanding and states she will inform pt.

## 2011-02-09 ENCOUNTER — Other Ambulatory Visit: Payer: Self-pay | Admitting: Hematology & Oncology

## 2011-02-09 ENCOUNTER — Encounter (HOSPITAL_BASED_OUTPATIENT_CLINIC_OR_DEPARTMENT_OTHER): Payer: Medicare Other | Admitting: Hematology & Oncology

## 2011-02-09 DIAGNOSIS — Z7901 Long term (current) use of anticoagulants: Secondary | ICD-10-CM

## 2011-02-09 DIAGNOSIS — D649 Anemia, unspecified: Secondary | ICD-10-CM

## 2011-02-09 DIAGNOSIS — D61818 Other pancytopenia: Secondary | ICD-10-CM

## 2011-02-09 DIAGNOSIS — N189 Chronic kidney disease, unspecified: Secondary | ICD-10-CM

## 2011-02-09 LAB — RETICULOCYTES (CHCC)
ABS Retic: 26.1 10*3/uL (ref 19.0–186.0)
Retic Ct Pct: 0.7 % (ref 0.4–3.1)

## 2011-02-09 LAB — CBC WITH DIFFERENTIAL (CANCER CENTER ONLY)
BASO%: 0.3 % (ref 0.0–2.0)
EOS%: 4.2 % (ref 0.0–7.0)
HGB: 11.4 g/dL — ABNORMAL LOW (ref 11.6–15.9)
LYMPH#: 1.1 10*3/uL (ref 0.9–3.3)
MCHC: 31.9 g/dL — ABNORMAL LOW (ref 32.0–36.0)
NEUT#: 4.2 10*3/uL (ref 1.5–6.5)
RDW: 14.6 % (ref 11.1–15.7)

## 2011-02-09 LAB — CHCC SATELLITE - SMEAR

## 2011-02-09 LAB — FERRITIN: Ferritin: 765 ng/mL — ABNORMAL HIGH (ref 10–291)

## 2011-02-09 LAB — IRON AND TIBC: Iron: 85 ug/dL (ref 42–145)

## 2011-02-14 ENCOUNTER — Encounter: Payer: Self-pay | Admitting: Pulmonary Disease

## 2011-03-10 ENCOUNTER — Telehealth: Payer: Self-pay | Admitting: Pulmonary Disease

## 2011-03-10 DIAGNOSIS — G4733 Obstructive sleep apnea (adult) (pediatric): Secondary | ICD-10-CM

## 2011-03-10 NOTE — Telephone Encounter (Signed)
BPAP 18/13>>Used on 30 of 30 nights with average 6hrs 42 min.  Average AHI 1.6.  Did have air leak.  Will have my nurse call to inform patient that BPAP download looked good except for airleaking from mask.  Order was recently sent to have mask refit to alleviate mask leak.  Otherwise no change to BPAP settings needed at this time.

## 2011-03-11 NOTE — Telephone Encounter (Signed)
Pt's daughter Verlon Au aware and will bring pt for follow-up on 6/14 w/ VS.

## 2011-03-14 ENCOUNTER — Encounter: Payer: Self-pay | Admitting: Pulmonary Disease

## 2011-03-17 ENCOUNTER — Encounter: Payer: Self-pay | Admitting: Pulmonary Disease

## 2011-03-17 ENCOUNTER — Ambulatory Visit (INDEPENDENT_AMBULATORY_CARE_PROVIDER_SITE_OTHER): Payer: Medicare Other | Admitting: Pulmonary Disease

## 2011-03-17 VITALS — BP 124/62 | HR 71 | Temp 97.8°F | Ht 64.0 in | Wt 204.2 lb

## 2011-03-17 DIAGNOSIS — G4736 Sleep related hypoventilation in conditions classified elsewhere: Secondary | ICD-10-CM

## 2011-03-17 DIAGNOSIS — R059 Cough, unspecified: Secondary | ICD-10-CM

## 2011-03-17 DIAGNOSIS — R0602 Shortness of breath: Secondary | ICD-10-CM

## 2011-03-17 DIAGNOSIS — G4733 Obstructive sleep apnea (adult) (pediatric): Secondary | ICD-10-CM

## 2011-03-17 DIAGNOSIS — J988 Other specified respiratory disorders: Secondary | ICD-10-CM

## 2011-03-17 DIAGNOSIS — J398 Other specified diseases of upper respiratory tract: Secondary | ICD-10-CM

## 2011-03-17 DIAGNOSIS — R05 Cough: Secondary | ICD-10-CM

## 2011-03-17 MED ORDER — WARFARIN SODIUM 1 MG PO TABS: ORAL_TABLET | ORAL | Status: DC

## 2011-03-17 NOTE — Progress Notes (Signed)
Subjective:    Patient ID: Cindy Cervantes, female    DOB: 1932-08-23, 78 y.o.   MRN: [id number]  HPI 77 yo female former smoker with cough 2nd to GERD, dyspnea, OSA/OHS, tracheobronchomalacia, and right hemidiaphragm elevation on BPAP and 3 liters oxygen 24/7.  She has been doing better with her breathing.  She is sleeping better, and more alert during the day since using BPAP.  She feels like the BPAP pressure is too much, and this causes mask leak.  She then gets dryness in her mouth.    She is not having sinus congestion, or much cough.  Her reflux is controlled.   Past Medical History  Diagnosis Date  . Paroxysmal a-fib with RVR  . History of hyperkalemia in setting of spironolactone 09/2010  . Urinary incontinence   . Osteoarthrosis, unspecified whether generalized or localized, unspecified site   . Chronic airway obstruction, not elsewhere classified   . Obesity   . Allergic rhinitis, cause unspecified   . Unspecified essential hypertension   . Senile cataract, unspecified   . Other and unspecified hyperlipidemia   . Disturbance of salivary secretion   . Dysphagia, unspecified   . Cough   . Obstructive sleep apnea (adult) (pediatric)   . Diverticulosis of colon (without mention of hemorrhage)   . Shortness of breath   . Personal history of colonic polyps      Family History  Problem Relation Age of Onset  . Heart disease Father      History   Social History  . Marital Status: Widowed    Spouse Name: N/A    Number of Children: N/A  . Years of Education: N/A   Occupational History  . Not on file.   Social History Main Topics  . Smoking status: Former Smoker -- 0.5 packs/day    Types: Cigarettes    Quit date: 10/03/1982  . Smokeless tobacco: Not on file  . Alcohol Use: Not on file  . Drug Use: No  . Sexually Active: Not on file   Other Topics Concern  . Not on file   Social History Narrative  . No narrative on file     Allergies  Allergen Reactions    . Iron     By infusion  . Morphine   . Toviaz (Fesoterodine Fumarate)      Outpatient Prescriptions Prior to Visit  Medication Sig Dispense Refill  . acetaminophen (TYLENOL) 325 MG tablet Take 650 mg by mouth every 6 (six) hours as needed.        Marland Kitchen amiodarone (PACERONE) 200 MG tablet Take 200 mg by mouth daily.       Marland Kitchen levothyroxine (SYNTHROID, LEVOTHROID) 25 MCG tablet Take 25 mcg by mouth daily.        . magnesium oxide (MAG-OX) 400 MG tablet Take 400 mg by mouth daily. 1 tab daily       . niacin (NIASPAN) 500 MG CR tablet Take 500 mg by mouth at bedtime. 1 tab qhs       . omeprazole (PRILOSEC) 20 MG capsule Take 20 mg by mouth daily.        . potassium chloride SA (K-DUR,KLOR-CON) 20 MEQ tablet Take 20 mEq by mouth daily.        . sertraline (ZOLOFT) 25 MG tablet Take 25 mg by mouth daily. 1 tab qd       . torsemide (DEMADEX) 20 MG tablet Take 20 mg by mouth daily.        Marland Kitchen  diltiazem (CARDIZEM) 120 MG tablet Take 120 mg by mouth daily.        . pindolol (VISKEN) 5 MG tablet Take 5 mg by mouth 2 (two) times daily.       Marland Kitchen warfarin (COUMADIN) 4 MG tablet Take 4 mg by mouth at bedtime. 1 tab qhs as directed by coumadin clinic             Review of Systems     Objective:   Physical Exam  BP 124/62  Pulse 71  Temp(Src) 97.8 F (36.6 C) (Oral)  Ht 5\' 4"  (1.626 m)  Wt 204 lb 3.2 oz (92.625 kg)  BMI 35.05 kg/m2  SpO2 96%  General: on supplemental oxygen and obese.  Eyes: PERRLA and EOMI.  Nose: no deformity, discharge, inflammation, or lesions  Mouth: MP 3, no exudate  Neck: no JVD.  Lungs: decreased breath sounds right base, no wheeze/rales on left  Heart: regular rhythm, normal rate, and no murmurs.  Abdomen: bowel sounds positive; abdomen soft and non-tender without masses, or organomegaly  Extremities: 1+ ankle edema  Neurologic: normal CN II-XII.  Cervical Nodes: no significant adenopathy  Psych: alert and cooperative; normal mood and affect; normal attention  span and concentration     Assessment & Plan:   OBSTRUCTIVE SLEEP APNEA Will decrease BPAP to 16/13 to see if this improves her airleak.  DYSPNEA Multifactorial. She has diastolic dysfuntion, obesity with deconditioning and hypoventilation, chronic right hemidiaphragm elevation, and tracheobronchomalacia on recent CT chest. She has prior history of tobacco abuse, but PFT's were more suggestive of restrictive rather than obstructive defect.   Cough 2nd to GERD.  Tracheobronchomalacia She is to continue supplemental oxygen and BPAP at night.  She appears reasonably compensated, and therefore don't think she needs airway stenting at this time.  SLEEP RELATED HYPOVENTILATION/HYPOXEMIA CCE She is to continue with supplemental oxygen 24/7.    Updated Medication List Outpatient Encounter Prescriptions as of 03/17/2011  Medication Sig Dispense Refill  . acetaminophen (TYLENOL) 325 MG tablet Take 650 mg by mouth every 6 (six) hours as needed.        Marland Kitchen amiodarone (PACERONE) 200 MG tablet Take 200 mg by mouth daily.       Marland Kitchen levothyroxine (SYNTHROID, LEVOTHROID) 25 MCG tablet Take 25 mcg by mouth daily.        . magnesium oxide (MAG-OX) 400 MG tablet Take 400 mg by mouth daily. 1 tab daily       . niacin (NIASPAN) 500 MG CR tablet Take 500 mg by mouth at bedtime. 1 tab qhs       . omeprazole (PRILOSEC) 20 MG capsule Take 20 mg by mouth daily.        . polyethylene glycol powder (MIRALAX) powder Take 17 g by mouth daily. As directed        . potassium chloride SA (K-DUR,KLOR-CON) 20 MEQ tablet Take 20 mEq by mouth daily.        . sertraline (ZOLOFT) 25 MG tablet Take 25 mg by mouth daily. 1 tab qd       . torsemide (DEMADEX) 20 MG tablet Take 20 mg by mouth daily.        Marland Kitchen warfarin (COUMADIN) 1 MG tablet Use as directed  30 tablet  0  . DISCONTD: diltiazem (CARDIZEM) 120 MG tablet Take 120 mg by mouth daily.        Marland Kitchen DISCONTD: pindolol (VISKEN) 5 MG tablet Take 5 mg by mouth 2 (two) times  daily.       Marland Kitchen DISCONTD: warfarin (COUMADIN) 4 MG tablet Take 4 mg by mouth at bedtime. 1 tab qhs as directed by coumadin clinic

## 2011-03-17 NOTE — Assessment & Plan Note (Signed)
Will decrease BPAP to 16/13 to see if this improves her airleak.

## 2011-03-17 NOTE — Patient Instructions (Signed)
Will decrease BPAP to 16/13>>call if you have trouble with this Follow up in 6 months

## 2011-03-23 ENCOUNTER — Encounter: Payer: Self-pay | Admitting: Pulmonary Disease

## 2011-03-23 NOTE — Assessment & Plan Note (Signed)
She is to continue with supplemental oxygen 24/7.

## 2011-03-23 NOTE — Assessment & Plan Note (Signed)
2nd to GERD.

## 2011-03-23 NOTE — Assessment & Plan Note (Signed)
Multifactorial. She has diastolic dysfuntion, obesity with deconditioning and hypoventilation, chronic right hemidiaphragm elevation, and tracheobronchomalacia on recent CT chest. She has prior history of tobacco abuse, but PFT's were more suggestive of restrictive rather than obstructive defect.

## 2011-03-23 NOTE — Assessment & Plan Note (Signed)
She is to continue supplemental oxygen and BPAP at night.  She appears reasonably compensated, and therefore don't think she needs airway stenting at this time.

## 2011-04-11 ENCOUNTER — Other Ambulatory Visit: Payer: Self-pay | Admitting: Hematology & Oncology

## 2011-04-11 ENCOUNTER — Encounter (HOSPITAL_BASED_OUTPATIENT_CLINIC_OR_DEPARTMENT_OTHER): Payer: Medicare Other | Admitting: Hematology & Oncology

## 2011-04-11 DIAGNOSIS — N189 Chronic kidney disease, unspecified: Secondary | ICD-10-CM

## 2011-04-11 DIAGNOSIS — D649 Anemia, unspecified: Secondary | ICD-10-CM

## 2011-04-11 DIAGNOSIS — Z7901 Long term (current) use of anticoagulants: Secondary | ICD-10-CM

## 2011-04-11 DIAGNOSIS — D539 Nutritional anemia, unspecified: Secondary | ICD-10-CM

## 2011-04-11 LAB — CBC WITH DIFFERENTIAL (CANCER CENTER ONLY)
BASO#: 0 10*3/uL (ref 0.0–0.2)
EOS%: 3.7 % (ref 0.0–7.0)
Eosinophils Absolute: 0.3 10*3/uL (ref 0.0–0.5)
HCT: 31.5 % — ABNORMAL LOW (ref 34.8–46.6)
HGB: 10.3 g/dL — ABNORMAL LOW (ref 11.6–15.9)
MCH: 32 pg (ref 26.0–34.0)
MCHC: 32.7 g/dL (ref 32.0–36.0)
MONO%: 9.2 % (ref 0.0–13.0)
NEUT%: 71.6 % (ref 39.6–80.0)
RBC: 3.22 10*6/uL — ABNORMAL LOW (ref 3.70–5.32)

## 2011-04-11 LAB — IRON AND TIBC
Iron: 34 ug/dL — ABNORMAL LOW (ref 42–145)
TIBC: 221 ug/dL — ABNORMAL LOW (ref 250–470)
UIBC: 187 ug/dL

## 2011-04-11 LAB — FERRITIN: Ferritin: 762 ng/mL — ABNORMAL HIGH (ref 10–291)

## 2011-04-11 LAB — RETICULOCYTES (CHCC)
ABS Retic: 38.5 10*3/uL (ref 19.0–186.0)
RBC.: 3.21 MIL/uL — ABNORMAL LOW (ref 3.87–5.11)

## 2011-04-21 ENCOUNTER — Encounter: Payer: Self-pay | Admitting: Internal Medicine

## 2011-04-22 ENCOUNTER — Encounter (HOSPITAL_BASED_OUTPATIENT_CLINIC_OR_DEPARTMENT_OTHER): Payer: Medicare Other | Admitting: Hematology & Oncology

## 2011-04-22 DIAGNOSIS — N289 Disorder of kidney and ureter, unspecified: Secondary | ICD-10-CM

## 2011-04-22 DIAGNOSIS — D509 Iron deficiency anemia, unspecified: Secondary | ICD-10-CM

## 2011-04-25 ENCOUNTER — Ambulatory Visit (INDEPENDENT_AMBULATORY_CARE_PROVIDER_SITE_OTHER): Payer: Medicare Other | Admitting: Internal Medicine

## 2011-04-25 ENCOUNTER — Encounter: Payer: Self-pay | Admitting: Internal Medicine

## 2011-04-25 VITALS — BP 150/70 | HR 80 | Resp 20 | Ht 64.0 in | Wt 205.0 lb

## 2011-04-25 DIAGNOSIS — Z950 Presence of cardiac pacemaker: Secondary | ICD-10-CM

## 2011-04-25 DIAGNOSIS — D649 Anemia, unspecified: Secondary | ICD-10-CM

## 2011-04-25 DIAGNOSIS — R05 Cough: Secondary | ICD-10-CM

## 2011-04-25 DIAGNOSIS — R059 Cough, unspecified: Secondary | ICD-10-CM

## 2011-04-25 DIAGNOSIS — I495 Sick sinus syndrome: Secondary | ICD-10-CM

## 2011-04-25 DIAGNOSIS — R0602 Shortness of breath: Secondary | ICD-10-CM

## 2011-04-25 DIAGNOSIS — I4891 Unspecified atrial fibrillation: Secondary | ICD-10-CM

## 2011-04-25 LAB — HEPATIC FUNCTION PANEL
ALT: 20 U/L (ref 0–35)
AST: 21 U/L (ref 0–37)
Albumin: 3.8 g/dL (ref 3.5–5.2)
Total Protein: 8.1 g/dL (ref 6.0–8.3)

## 2011-04-25 LAB — CBC WITH DIFFERENTIAL/PLATELET
Basophils Absolute: 0 10*3/uL (ref 0.0–0.1)
Eosinophils Relative: 1.9 % (ref 0.0–5.0)
HCT: 34.2 % — ABNORMAL LOW (ref 36.0–46.0)
Lymphocytes Relative: 14.2 % (ref 12.0–46.0)
Lymphs Abs: 1.6 10*3/uL (ref 0.7–4.0)
Monocytes Relative: 7.7 % (ref 3.0–12.0)
Neutrophils Relative %: 75.8 % (ref 43.0–77.0)
Platelets: 417 10*3/uL — ABNORMAL HIGH (ref 150.0–400.0)
RDW: 14.6 % (ref 11.5–14.6)
WBC: 11.3 10*3/uL — ABNORMAL HIGH (ref 4.5–10.5)

## 2011-04-25 LAB — BASIC METABOLIC PANEL
BUN: 22 mg/dL (ref 6–23)
Calcium: 8.7 mg/dL (ref 8.4–10.5)
Creatinine, Ser: 1.5 mg/dL — ABNORMAL HIGH (ref 0.4–1.2)
GFR: 35.76 mL/min — ABNORMAL LOW (ref >60.00)
Glucose, Bld: 104 mg/dL — ABNORMAL HIGH (ref 70–99)

## 2011-04-25 LAB — BRAIN NATRIURETIC PEPTIDE: Pro B Natriuretic peptide (BNP): 67 pg/mL (ref 0.0–100.0)

## 2011-04-25 LAB — TSH: TSH: 3.43 u[IU]/mL (ref 0.35–5.50)

## 2011-04-25 NOTE — Progress Notes (Deleted)
HPI  Cindy Cervantes is a 77 y.o. female with pacer implant for tachybrady paroxysmal atrial fibrillation and diastolic heart failure. She was treated initially with atenolol but had progressive bradycardia. She then went on amiodarone for control of her atrial fibrillation   A 2-D echocardiogram September 08, 2010:  LVEF 65-70%.    She has doing relatively well. She's had no significant edema. There are some good days and bad some bad days.   .   Past Medical History  Diagnosis Date  . Paroxysmal a-fib with RVR  . History of hyperkalemia in setting of spironolactone 09/2010  . Urinary incontinence   . Osteoarthritis   . Obesity   . Allergic rhinitis   . Hypertension   . Senile cataract   . Hyperlipidemia   . Xerostomia   . Dysphagia   . Cough     2nd to reflux  . Obstructive sleep apnea (adult) (pediatric)   . Diverticulosis of colon   . Obesity hypoventilation syndrome   . Colon polyp   . Tracheobronchomalacia   . Diaphragm dysfunction     Right hemidiaphragm  . Hypothyroidism     Past Surgical History  Procedure Date  . Tubal ligation 1964  . Total knee arthroplasty 1999    right knee  . Tummy tuck 2000    pt "almost died"; respiratory distress, became delrious  . Total knee arthroplasty 2003    left knee  . Cataract extraction 2008    Current Outpatient Prescriptions  Medication Sig Dispense Refill  . acetaminophen (TYLENOL) 325 MG tablet Take 650 mg by mouth every 6 (six) hours as needed.        Marland Kitchen amiodarone (PACERONE) 200 MG tablet Take 200 mg by mouth daily.       Marland Kitchen levothyroxine (SYNTHROID, LEVOTHROID) 25 MCG tablet Take 25 mcg by mouth daily.        . magnesium oxide (MAG-OX) 400 MG tablet Take 400 mg by mouth daily. 1 tab daily       . niacin (NIASPAN) 500 MG CR tablet Take 500 mg by mouth at bedtime. 1 tab qhs       . omeprazole (PRILOSEC) 20 MG capsule Take 20 mg by mouth daily.        . polyethylene glycol powder (MIRALAX) powder Take 17 g by mouth  daily. As directed        . potassium chloride SA (K-DUR,KLOR-CON) 20 MEQ tablet Take 20 mEq by mouth daily.        . sertraline (ZOLOFT) 25 MG tablet Take 25 mg by mouth daily. 1 tab qd       . torsemide (DEMADEX) 20 MG tablet Take 20 mg by mouth daily.        Marland Kitchen warfarin (COUMADIN) 1 MG tablet Use as directed  30 tablet  0    Allergies  Allergen Reactions  . Iron     By infusion  . Morphine   . Toviaz (Fesoterodine Fumarate)     Review of Systems negative except from HPI and PMH  Physical Exam Well developed and well nourished in no acute distress wearing oxygen  HENT normal E scleral and icterus clear Neck Supple JVP flat; carotids brisk and full Clear to ausculation Pocket well healed Regular rate and rhythm, no murmurs gallops or rub Soft with active bowel sounds No clubbing cyanosis and edema Alert and oriented, grossly normal motor and sensory function Skin Warm and Dry     Assessment and  Plan  ll

## 2011-04-25 NOTE — Assessment & Plan Note (Signed)
40% atrially paced 

## 2011-04-25 NOTE — Patient Instructions (Signed)
Your physician recommends that you have lab work today: bmp/liver/tsh/bnp (427.31;427.81)  Your physician recommends that you continue on your current medications as directed. Please refer to the Current Medication list given to you today.  Your physician wants you to follow-up in: 6 months. You will receive a reminder letter in the mail two months in advance. If you don't receive a letter, please call our office to schedule the follow-up appointment.

## 2011-04-25 NOTE — Progress Notes (Signed)
  HPI  Cindy Cervantes is a 77 y.o. female Seen in followup for her paroxysmal atrial fibrillation with a rapid ventricular response and sinus node dysfunction for which he required pacemaker implantation in the fall of 2011. She has done much better since that time.  She is troubled with a chronic cough. This is somewhat worse again. He has not had significant problems with peripheral edema  Past Medical History  Diagnosis Date  . Paroxysmal a-fib with RVR  . History of hyperkalemia in setting of spironolactone 09/2010  . Urinary incontinence   . Osteoarthritis   . Obesity   . Allergic rhinitis   . Hypertension   . Senile cataract   . Hyperlipidemia   . Xerostomia   . Dysphagia   . Cough     2nd to reflux  . Obstructive sleep apnea (adult) (pediatric)   . Diverticulosis of colon   . Obesity hypoventilation syndrome   . Colon polyp   . Tracheobronchomalacia   . Diaphragm dysfunction     Right hemidiaphragm  . Hypothyroidism     Past Surgical History  Procedure Date  . Tubal ligation 1964  . Total knee arthroplasty 1999    right knee  . Tummy tuck 2000    pt "almost died"; respiratory distress, became delrious  . Total knee arthroplasty 2003    left knee  . Cataract extraction 2008    Current Outpatient Prescriptions  Medication Sig Dispense Refill  . acetaminophen (TYLENOL) 325 MG tablet Take 650 mg by mouth every 6 (six) hours as needed.        Marland Kitchen amiodarone (PACERONE) 200 MG tablet Take 200 mg by mouth daily.       Marland Kitchen levothyroxine (SYNTHROID, LEVOTHROID) 25 MCG tablet Take 25 mcg by mouth daily.        . magnesium oxide (MAG-OX) 400 MG tablet Take 400 mg by mouth daily. 1 tab daily       . niacin (NIASPAN) 500 MG CR tablet Take 500 mg by mouth at bedtime. 1 tab qhs       . omeprazole (PRILOSEC) 20 MG capsule Take 20 mg by mouth daily.        . polyethylene glycol powder (MIRALAX) powder Take 17 g by mouth daily. As directed        . potassium chloride SA  (K-DUR,KLOR-CON) 20 MEQ tablet Take 20 mEq by mouth daily.        . sertraline (ZOLOFT) 25 MG tablet Take 25 mg by mouth daily. 1 tab qd       . torsemide (DEMADEX) 20 MG tablet Take 20 mg by mouth daily.        Marland Kitchen warfarin (COUMADIN) 1 MG tablet Use as directed  30 tablet  0    Allergies  Allergen Reactions  . Iron     By infusion  . Morphine   . Toviaz (Fesoterodine Fumarate)     Review of Systems negative except from HPI and PMH  Physical Exam Well developed and well nourished in no acute distress Using nasal oxygen HENT normal E scleral and icterus clear Neck Supple JVP flat; carotids brisk and full Clear to ausculation Regular rate and rhythm, no murmurs gallops or rub Soft with active bowel sounds No clubbing cyanosis; traceedema Alert and oriented, grossly normal motor and sensory function Skin Warm and Dry   Assessment and  Plan

## 2011-04-25 NOTE — Assessment & Plan Note (Signed)
The patient's device was interrogated.  The information was reviewed. No changes were made in the programming.    

## 2011-04-25 NOTE — Assessment & Plan Note (Signed)
We'll check her CBC today 

## 2011-04-25 NOTE — Assessment & Plan Note (Signed)
No evidence of volume overload but we'll check a BNP.

## 2011-04-25 NOTE — Assessment & Plan Note (Signed)
Infrequent atrial fibrillation; the patient will have her amiodarone surveillance laboratories checked today.

## 2011-05-03 ENCOUNTER — Telehealth: Payer: Self-pay | Admitting: Internal Medicine

## 2011-05-03 NOTE — Telephone Encounter (Signed)
Call back to speak with nurse.

## 2011-05-03 NOTE — Telephone Encounter (Signed)
See note attached to lab work.

## 2011-05-17 ENCOUNTER — Encounter: Payer: Self-pay | Admitting: Internal Medicine

## 2011-05-25 ENCOUNTER — Other Ambulatory Visit: Payer: Self-pay | Admitting: Hematology & Oncology

## 2011-05-25 ENCOUNTER — Encounter (HOSPITAL_BASED_OUTPATIENT_CLINIC_OR_DEPARTMENT_OTHER): Payer: Medicare Other | Admitting: Hematology & Oncology

## 2011-05-25 DIAGNOSIS — Z7901 Long term (current) use of anticoagulants: Secondary | ICD-10-CM

## 2011-05-25 DIAGNOSIS — N189 Chronic kidney disease, unspecified: Secondary | ICD-10-CM

## 2011-05-25 DIAGNOSIS — D539 Nutritional anemia, unspecified: Secondary | ICD-10-CM

## 2011-05-25 DIAGNOSIS — D4620 Refractory anemia with excess of blasts, unspecified: Secondary | ICD-10-CM

## 2011-05-25 DIAGNOSIS — D649 Anemia, unspecified: Secondary | ICD-10-CM

## 2011-05-25 LAB — CBC WITH DIFFERENTIAL (CANCER CENTER ONLY)
BASO#: 0 10*3/uL (ref 0.0–0.2)
BASO%: 0.1 % (ref 0.0–2.0)
EOS%: 2.8 % (ref 0.0–7.0)
HGB: 11.2 g/dL — ABNORMAL LOW (ref 11.6–15.9)
MCH: 31.8 pg (ref 26.0–34.0)
MCHC: 32.9 g/dL (ref 32.0–36.0)
MONO%: 7 % (ref 0.0–13.0)
NEUT#: 5 10*3/uL (ref 1.5–6.5)
Platelets: 209 10*3/uL (ref 145–400)
RDW: 14.1 % (ref 11.1–15.7)

## 2011-05-25 LAB — RETICULOCYTES (CHCC)
ABS Retic: 57.4 10*3/uL (ref 19.0–186.0)
RBC.: 3.59 MIL/uL — ABNORMAL LOW (ref 3.87–5.11)

## 2011-05-26 ENCOUNTER — Other Ambulatory Visit: Payer: Self-pay | Admitting: Internal Medicine

## 2011-05-26 DIAGNOSIS — Z780 Asymptomatic menopausal state: Secondary | ICD-10-CM

## 2011-05-26 DIAGNOSIS — Z1231 Encounter for screening mammogram for malignant neoplasm of breast: Secondary | ICD-10-CM

## 2011-06-08 ENCOUNTER — Other Ambulatory Visit: Payer: Medicare Other

## 2011-06-08 ENCOUNTER — Ambulatory Visit
Admission: RE | Admit: 2011-06-08 | Discharge: 2011-06-08 | Disposition: A | Discharge status: Home / Self Care | Payer: Medicare Other | Source: Ambulatory Visit | Attending: Internal Medicine | Admitting: Internal Medicine

## 2011-06-08 ENCOUNTER — Ambulatory Visit: Payer: Medicare Other

## 2011-06-08 DIAGNOSIS — Z1231 Encounter for screening mammogram for malignant neoplasm of breast: Secondary | ICD-10-CM

## 2011-06-08 DIAGNOSIS — Z780 Asymptomatic menopausal state: Secondary | ICD-10-CM

## 2011-06-14 ENCOUNTER — Other Ambulatory Visit: Payer: Self-pay | Admitting: Internal Medicine

## 2011-06-14 DIAGNOSIS — R928 Other abnormal and inconclusive findings on diagnostic imaging of breast: Secondary | ICD-10-CM

## 2011-06-21 ENCOUNTER — Ambulatory Visit
Admission: RE | Admit: 2011-06-21 | Discharge: 2011-06-21 | Disposition: A | Discharge status: Home / Self Care | Payer: Medicare Other | Source: Ambulatory Visit | Attending: Internal Medicine | Admitting: Internal Medicine

## 2011-06-21 DIAGNOSIS — R928 Other abnormal and inconclusive findings on diagnostic imaging of breast: Secondary | ICD-10-CM

## 2011-06-29 ENCOUNTER — Encounter (HOSPITAL_BASED_OUTPATIENT_CLINIC_OR_DEPARTMENT_OTHER): Payer: Medicare Other | Admitting: Hematology & Oncology

## 2011-06-29 ENCOUNTER — Other Ambulatory Visit: Payer: Self-pay | Admitting: Hematology & Oncology

## 2011-06-29 DIAGNOSIS — N189 Chronic kidney disease, unspecified: Secondary | ICD-10-CM

## 2011-06-29 DIAGNOSIS — N289 Disorder of kidney and ureter, unspecified: Secondary | ICD-10-CM

## 2011-06-29 DIAGNOSIS — D649 Anemia, unspecified: Secondary | ICD-10-CM

## 2011-06-29 DIAGNOSIS — Z7901 Long term (current) use of anticoagulants: Secondary | ICD-10-CM

## 2011-06-29 DIAGNOSIS — D509 Iron deficiency anemia, unspecified: Secondary | ICD-10-CM

## 2011-06-29 DIAGNOSIS — D539 Nutritional anemia, unspecified: Secondary | ICD-10-CM

## 2011-06-29 LAB — CBC WITH DIFFERENTIAL (CANCER CENTER ONLY)
BASO#: 0 10*3/uL (ref 0.0–0.2)
Eosinophils Absolute: 0.3 10*3/uL (ref 0.0–0.5)
HGB: 11.6 g/dL (ref 11.6–15.9)
LYMPH%: 19.2 % (ref 14.0–48.0)
MCV: 97 fL (ref 81–101)
MONO#: 0.6 10*3/uL (ref 0.1–0.9)
NEUT#: 5.2 10*3/uL (ref 1.5–6.5)
Platelets: 174 10*3/uL (ref 145–400)
RBC: 3.68 10*6/uL — ABNORMAL LOW (ref 3.70–5.32)
WBC: 7.6 10*3/uL (ref 3.9–10.0)

## 2011-06-29 LAB — CHCC SATELLITE - SMEAR

## 2011-07-03 LAB — FERRITIN: Ferritin: 716 ng/mL — ABNORMAL HIGH (ref 10–291)

## 2011-07-03 LAB — IRON AND TIBC: Iron: 55 ug/dL (ref 42–145)

## 2011-07-03 LAB — RETICULOCYTES (CHCC)
ABS Retic: 40.6 10*3/uL (ref 19.0–186.0)
Retic Ct Pct: 1.1 % (ref 0.4–2.3)

## 2011-08-04 ENCOUNTER — Other Ambulatory Visit: Payer: Self-pay | Admitting: Hematology & Oncology

## 2011-08-04 ENCOUNTER — Encounter (HOSPITAL_BASED_OUTPATIENT_CLINIC_OR_DEPARTMENT_OTHER): Payer: Medicare Other | Admitting: Hematology & Oncology

## 2011-08-04 DIAGNOSIS — Z7901 Long term (current) use of anticoagulants: Secondary | ICD-10-CM

## 2011-08-04 DIAGNOSIS — N189 Chronic kidney disease, unspecified: Secondary | ICD-10-CM

## 2011-08-04 DIAGNOSIS — D509 Iron deficiency anemia, unspecified: Secondary | ICD-10-CM

## 2011-08-04 DIAGNOSIS — N289 Disorder of kidney and ureter, unspecified: Secondary | ICD-10-CM

## 2011-08-04 DIAGNOSIS — D539 Nutritional anemia, unspecified: Secondary | ICD-10-CM

## 2011-08-04 DIAGNOSIS — D649 Anemia, unspecified: Secondary | ICD-10-CM

## 2011-08-04 LAB — IRON AND TIBC
%SAT: 26 % (ref 20–55)
Iron: 68 ug/dL (ref 42–145)
TIBC: 263 ug/dL (ref 250–470)
UIBC: 195 ug/dL (ref 125–400)

## 2011-08-04 LAB — CBC WITH DIFFERENTIAL (CANCER CENTER ONLY)
BASO#: 0 10*3/uL (ref 0.0–0.2)
Eosinophils Absolute: 0.2 10*3/uL (ref 0.0–0.5)
HCT: 35.2 % (ref 34.8–46.6)
HGB: 11.6 g/dL (ref 11.6–15.9)
LYMPH%: 17.8 % (ref 14.0–48.0)
MCH: 31.8 pg (ref 26.0–34.0)
MCV: 96 fL (ref 81–101)
MONO%: 9.1 % (ref 0.0–13.0)
NEUT%: 69.5 % (ref 39.6–80.0)
RBC: 3.65 10*6/uL — ABNORMAL LOW (ref 3.70–5.32)

## 2011-08-04 LAB — FERRITIN: Ferritin: 617 ng/mL — ABNORMAL HIGH (ref 10–291)

## 2011-08-04 LAB — RETICULOCYTES (CHCC): Retic Ct Pct: 1.2 % (ref 0.4–2.3)

## 2011-08-08 ENCOUNTER — Telehealth: Payer: Self-pay | Admitting: *Deleted

## 2011-08-08 NOTE — Progress Notes (Signed)
CC:   Cindy Cervantes. Cindy Cervantes, M.D. Cindy Salvia, MD, [facility] [place], [place] [phone]  DIAGNOSES: 1. Anemia of renal insufficiency. 2. Recurrent iron deficiency anemia. 3. Refractory anemia, low risk prognostic category.  CURRENT THERAPY: 1. IV iron as indicated. 2. Aranesp 300 mcg subcu as needed for hemoglobin less than 11.  INTERVAL HISTORY:  [name] comes in for followup.  She is doing quite well.  She feels well.  Her breathing seems to be doing better. The cool weather is helping her.  She does have a little allergy to mold, which she tries to avoid leaves.  When we last saw her, her ferritin was 716.  Her iron saturation was 21.  She last got iron back in July.  She last got Aranesp back in July.  She has had no bleeding.  Her appetite has been good.  She has a little constipation.  She is on supplemental oxygen all the time.  There has been no increase in dyspnea.  PHYSICAL EXAMINATION:  General:  This is a well-developed, well- nourished white female in no obvious distress.  Vital signs:  Show a temperature of 97.2, pulse 97, respiratory rate 18, blood pressure 148/83.  Weight is 217.  Head and neck:  Exam shows a normocephalic, atraumatic skull.  There are no ocular or oral lesions.  Lymphs:  There are no palpable cervical or supraclavicular lymph nodes.  Lungs:  Are clear bilaterally.  No rales, wheezes or rhonchi.  Cardiac:  Regular rate and rhythm with normal S1, S2.  There are no murmurs, rubs or bruits.  Abdomen:  Soft.  Good bowel sounds.  There is no fluid wave. No palpable hepatosplenomegaly.  Extremities:  No clubbing, cyanosis or edema.  She has good range of motion of her joints.  She has good pulses in her distal extremities.  LABORATORY STUDIES:  White cell count is 6.6, hemoglobin 11.6, hematocrit 35.2, platelet count 169.  MCV is 96.  IMPRESSION:  [name] is a 77 year old white female with multifactorial anemia.  For now, she is  holding her blood count very nicely.  We will have to see what her iron stores are.  I would not think that they would be on the low side, but again one never knows.  I do not think we need to get [name] back until after the holidays. If her iron is markedly diminished from her last reading then we may have to treat her before we see her back.  We will certainly call her about this.    ______________________________ Josph Macho, M.D. PRE/MEDQ  D:  08/04/2011  T:  08/07/2011  Job:  [id number]

## 2011-08-10 NOTE — Telephone Encounter (Signed)
Called to let patient know that her iron levels were good per dr. Myna Hidalgo

## 2011-09-07 ENCOUNTER — Ambulatory Visit (INDEPENDENT_AMBULATORY_CARE_PROVIDER_SITE_OTHER): Payer: Medicare Other | Admitting: Pulmonary Disease

## 2011-09-07 ENCOUNTER — Encounter: Payer: Self-pay | Admitting: Pulmonary Disease

## 2011-09-07 DIAGNOSIS — J398 Other specified diseases of upper respiratory tract: Secondary | ICD-10-CM

## 2011-09-07 DIAGNOSIS — G4733 Obstructive sleep apnea (adult) (pediatric): Secondary | ICD-10-CM

## 2011-09-07 DIAGNOSIS — G4736 Sleep related hypoventilation in conditions classified elsewhere: Secondary | ICD-10-CM

## 2011-09-07 DIAGNOSIS — R0602 Shortness of breath: Secondary | ICD-10-CM

## 2011-09-07 NOTE — Assessment & Plan Note (Signed)
Multifactorial. She has diastolic dysfuntion, obesity with deconditioning and hypoventilation, chronic right hemidiaphragm elevation, and tracheobronchomalacia on CT chest.

## 2011-09-07 NOTE — Patient Instructions (Signed)
Will arrange for portable oxygen concentrator  Follow up in 6 months 

## 2011-09-07 NOTE — Progress Notes (Signed)
Chief Complaint  Patient presents with  . Follow-up    Pt states she wears her bpap everynight. Pt states her mask still leaks occasionally.    History of Present Illness: Cindy Cervantes is a 77 y.o. female former smoker with cough 2nd to GERD, dyspnea, OSA/OHS, tracheobronchomalacia, and right hemidiaphragm elevation on BPAP and 2 liters oxygen 24/7.  She has been doing well.  She has occasional cough, mostly in the morning.  She denies chest pain or wheeze.  She does not feel her breathing limits the activities she wants to do.  She feels BPAP helps her sleep and breathing.  She is using 2 liters oxygen.  She has noticed her mask has been leaking over the past few days, and as a result her mouth is getting dry.  She is due for a new mask.  She wants a portable oxygen concetrator.  Past Medical History  Diagnosis Date  . Paroxysmal a-fib with RVR    Amiodarone  . History of hyperkalemia in setting of spironolactone 09/2010  . Sinus node dysfunction   . Pacemaker     Implanted December 2012  . Obesity   . Allergic rhinitis   . Hypertension   . Senile cataract   . Hyperlipidemia   . Xerostomia   . Dysphagia   . Cough     2nd to reflux  . Obstructive sleep apnea (adult) (pediatric)   . Diverticulosis of colon   . Obesity hypoventilation syndrome   . Colon polyp   . Tracheobronchomalacia   . Diaphragm dysfunction     Right hemidiaphragm  . Hypothyroidism     Past Surgical History  Procedure Date  . Tubal ligation 1964  . Total knee arthroplasty 1999    right knee  . Tummy tuck 2000    pt "almost died"; respiratory distress, became delrious  . Total knee arthroplasty 2003    left knee  . Cataract extraction 2008    Current Outpatient Prescriptions on File Prior to Visit  Medication Sig Dispense Refill  . acetaminophen (TYLENOL) 325 MG tablet Take 650 mg by mouth every 6 (six) hours as needed.        Marland Kitchen amiodarone (PACERONE) 200 MG tablet Take 200 mg by mouth daily.        Marland Kitchen levothyroxine (SYNTHROID, LEVOTHROID) 25 MCG tablet Take 25 mcg by mouth daily.        . magnesium oxide (MAG-OX) 400 MG tablet Take 400 mg by mouth daily. 1 tab daily       . niacin (NIASPAN) 500 MG CR tablet Take 500 mg by mouth at bedtime. 1 tab qhs       . omeprazole (PRILOSEC) 20 MG capsule Take 20 mg by mouth daily.        . polyethylene glycol powder (MIRALAX) powder Take 17 g by mouth daily. As directed        . potassium chloride SA (K-DUR,KLOR-CON) 20 MEQ tablet Take 20 mEq by mouth daily.        . sertraline (ZOLOFT) 25 MG tablet Take 25 mg by mouth daily. 1 tab qd       . torsemide (DEMADEX) 20 MG tablet Take 20 mg by mouth daily.        Marland Kitchen warfarin (COUMADIN) 1 MG tablet Use as directed  30 tablet  0    Allergies  Allergen Reactions  . Iron     By infusion  . Morphine   . Toviaz (Fesoterodine Fumarate)  Physical Exam:  Blood pressure 158/60, pulse 68, temperature 98 F (36.7 C), temperature source Oral, height 5\' 4"  (1.626 m), weight 221 lb 12.8 oz (100.608 kg), SpO2 95.00%. Body mass index is 38.07 kg/(m^2). Wt Readings from Last 3 Encounters:  09/07/11 221 lb 12.8 oz (100.608 kg)  04/25/11 205 lb (92.987 kg)  03/17/11 204 lb 3.2 oz (92.625 kg)    General - Obese, using supplemental oxygen HEENT - no sinus tenderness, no oral exudate, no LAN Cardiac - s1s2 regular, no murmur Chest - no wheeze/rales/dullness Abdomen - soft, nontender Extremities - no edema Skin - no rashes Neurologic - normal strength Psychiatric - normal mood, behavior   Assessment/Plan:  OBSTRUCTIVE SLEEP APNEA She is to continue BPAP 16/13 cm H2O.  Advised her to call if she still has trouble after she gets a new mask.  SLEEP RELATED HYPOVENTILATION/HYPOXEMIA CCE She maintains her oxygen at rest, but still has desaturation with exertion.  She is to continue with 2 liters oxygen with exertion and sleep.  Will arrange for portable oxygen  concentrator.  Tracheobronchomalacia She is to continue supplemental oxygen and BPAP at night.  She appears reasonably compensated, and therefore don't think she needs airway stenting at this time.    DYSPNEA Multifactorial. She has diastolic dysfuntion, obesity with deconditioning and hypoventilation, chronic right hemidiaphragm elevation, and tracheobronchomalacia on CT chest.       Outpatient Encounter Prescriptions as of 09/07/2011  Medication Sig Dispense Refill  . acetaminophen (TYLENOL) 325 MG tablet Take 650 mg by mouth every 6 (six) hours as needed.        Marland Kitchen amiodarone (PACERONE) 200 MG tablet Take 200 mg by mouth daily.       Marland Kitchen levothyroxine (SYNTHROID, LEVOTHROID) 25 MCG tablet Take 25 mcg by mouth daily.        . magnesium oxide (MAG-OX) 400 MG tablet Take 400 mg by mouth daily. 1 tab daily       . niacin (NIASPAN) 500 MG CR tablet Take 500 mg by mouth at bedtime. 1 tab qhs       . omeprazole (PRILOSEC) 20 MG capsule Take 20 mg by mouth daily.        . polyethylene glycol powder (MIRALAX) powder Take 17 g by mouth daily. As directed        . potassium chloride SA (K-DUR,KLOR-CON) 20 MEQ tablet Take 20 mEq by mouth daily.        . sertraline (ZOLOFT) 25 MG tablet Take 25 mg by mouth daily. 1 tab qd       . torsemide (DEMADEX) 20 MG tablet Take 20 mg by mouth daily.        Marland Kitchen warfarin (COUMADIN) 1 MG tablet Use as directed  30 tablet  0  . warfarin (COUMADIN) 4 MG tablet Take as directed         Jasey Cortez Pager:  [phone] 09/07/2011, 10:06 AM

## 2011-09-07 NOTE — Assessment & Plan Note (Signed)
She is to continue BPAP 16/13 cm H2O.  Advised her to call if she still has trouble after she gets a new mask.

## 2011-09-07 NOTE — Assessment & Plan Note (Signed)
She maintains her oxygen at rest, but still has desaturation with exertion.  She is to continue with 2 liters oxygen with exertion and sleep.  Will arrange for portable oxygen concentrator.

## 2011-09-07 NOTE — Assessment & Plan Note (Signed)
She is to continue supplemental oxygen and BPAP at night.  She appears reasonably compensated, and therefore don't think she needs airway stenting at this time.

## 2011-10-05 DIAGNOSIS — H04129 Dry eye syndrome of unspecified lacrimal gland: Secondary | ICD-10-CM | POA: Diagnosis not present

## 2011-10-19 DIAGNOSIS — Z7901 Long term (current) use of anticoagulants: Secondary | ICD-10-CM | POA: Diagnosis not present

## 2011-10-19 DIAGNOSIS — I4891 Unspecified atrial fibrillation: Secondary | ICD-10-CM | POA: Diagnosis not present

## 2011-10-21 ENCOUNTER — Ambulatory Visit (HOSPITAL_BASED_OUTPATIENT_CLINIC_OR_DEPARTMENT_OTHER): Payer: Medicare Other | Admitting: Hematology & Oncology

## 2011-10-21 ENCOUNTER — Encounter: Payer: Self-pay | Admitting: Hematology & Oncology

## 2011-10-21 ENCOUNTER — Other Ambulatory Visit (HOSPITAL_BASED_OUTPATIENT_CLINIC_OR_DEPARTMENT_OTHER): Payer: Medicare Other | Admitting: Lab

## 2011-10-21 DIAGNOSIS — D4620 Refractory anemia with excess of blasts, unspecified: Secondary | ICD-10-CM

## 2011-10-21 DIAGNOSIS — I4891 Unspecified atrial fibrillation: Secondary | ICD-10-CM

## 2011-10-21 DIAGNOSIS — D649 Anemia, unspecified: Secondary | ICD-10-CM | POA: Diagnosis not present

## 2011-10-21 DIAGNOSIS — N189 Chronic kidney disease, unspecified: Secondary | ICD-10-CM | POA: Insufficient documentation

## 2011-10-21 DIAGNOSIS — D509 Iron deficiency anemia, unspecified: Secondary | ICD-10-CM

## 2011-10-21 DIAGNOSIS — Z7901 Long term (current) use of anticoagulants: Secondary | ICD-10-CM

## 2011-10-21 DIAGNOSIS — D46Z Other myelodysplastic syndromes: Secondary | ICD-10-CM

## 2011-10-21 DIAGNOSIS — D539 Nutritional anemia, unspecified: Secondary | ICD-10-CM | POA: Diagnosis not present

## 2011-10-21 DIAGNOSIS — D631 Anemia in chronic kidney disease: Secondary | ICD-10-CM

## 2011-10-21 HISTORY — DX: Refractory anemia with excess of blasts, unspecified: D46.20

## 2011-10-21 HISTORY — DX: Other myelodysplastic syndromes: D46.Z

## 2011-10-21 HISTORY — DX: Anemia in chronic kidney disease: D63.1

## 2011-10-21 HISTORY — DX: Anemia in chronic kidney disease: N18.9

## 2011-10-21 HISTORY — DX: Iron deficiency anemia, unspecified: D50.9

## 2011-10-21 LAB — CHCC SATELLITE - SMEAR

## 2011-10-21 LAB — CBC WITH DIFFERENTIAL (CANCER CENTER ONLY)
BASO%: 0.2 % (ref 0.0–2.0)
HCT: 36.1 % (ref 34.8–46.6)
LYMPH#: 1.5 10*3/uL (ref 0.9–3.3)
MONO#: 0.6 10*3/uL (ref 0.1–0.9)
Platelets: 182 10*3/uL (ref 145–400)
RDW: 13.6 % (ref 11.1–15.7)
WBC: 6.6 10*3/uL (ref 3.9–10.0)

## 2011-10-21 LAB — RETICULOCYTES (CHCC)
ABS Retic: 49.3 10*3/uL (ref 19.0–186.0)
RBC.: 3.79 MIL/uL — ABNORMAL LOW (ref 3.87–5.11)

## 2011-10-21 NOTE — Progress Notes (Signed)
This office note has been dictated.

## 2011-10-24 NOTE — Progress Notes (Signed)
CC:   Lenon Curt. Chilton Si, M.D. Duke Salvia, MD, [facility]  DIAGNOSES: 1. Anemia of renal insufficiency. 2. Refractory anemia, low risk. 3. Recurrent iron deficiency anemia.  CURRENT THERAPY: 1. IV iron as indicated. 2. Aranesp 300 mcg subcu as needed for hemoglobin less than 11.  INTERVAL HISTORY:  [name] comes in for followup.  She really looks good.  We last saw her in November.  She is at [facility].  She has been doing well there.  She enjoyed the holidays.  She had a new air compressor for her oxygen.  She last got iron back in July.  Back when we last saw her in November, her ferritin was 617.  Her iron saturation was 26%.  She has had no problems with bleeding.  She continues on her slew of medications.  She is on Coumadin.  She is also on amiodarone.  She has had no problems with congestive heart failure.  She sees her cardiologist I think at the end of the month.  PHYSICAL EXAMINATION:  General:  This is an elderly but well-nourished white female in no obvious distress.  Vital signs:  97.2, pulse 64, respiratory rate 16, blood pressure 140/80.  Weight is 219.  Head and neck:  Exam shows a normocephalic, atraumatic skull.  There are no ocular or oral lesions.  There are no palpable cervical or supraclavicular lymph nodes.  Lungs:  Are clear bilaterally.  Cardiac: Regular rate and rhythm with normal S1 and S2.  She has no murmurs, rubs or bruits.  Abdomen:  Soft with good bowel sounds.  There is no palpable abdominal mass.  No palpable hepatosplenomegaly.  Extremities:  Show no clubbing, cyanosis or edema.  Actually she may have some trace edema bilaterally.  She has decent range of motion of her joints. Neurological:  Exam shows no focal neurological deficits.  LABORATORY STUDIES:  Show a white cell count of 6.6, hemoglobin 11.7, hematocrit 36.1, platelet count 182.  MCV is 98.  IMPRESSION:  [name] is a charming  78 year old white female with multifactorial anemia.  Her anemia really is holding steady right now. We will see what her iron studies are.  One would think that she would not need any iron.  She does not need any Aranesp.  I am just glad to see that her blood counts are holding steady.  She is on a ton of medications.  The amiodarone, in my mind, is the most troublesome with respect to her blood counts.  I think because she is doing well, we can probably get her back in 3 or 4 months.  We will try to get her through the winter time and get her back in the spring when it will be easier for her to get here.  Her daughter brings her.    ______________________________ Josph Macho, M.D. PRE/MEDQ  D:  10/21/2011  T:  10/22/2011  Job:  [id number]

## 2011-10-26 ENCOUNTER — Ambulatory Visit (INDEPENDENT_AMBULATORY_CARE_PROVIDER_SITE_OTHER): Payer: Medicare Other | Admitting: Internal Medicine

## 2011-10-26 ENCOUNTER — Encounter: Payer: Self-pay | Admitting: Internal Medicine

## 2011-10-26 VITALS — BP 138/76 | HR 68 | Ht 64.0 in | Wt 219.1 lb

## 2011-10-26 DIAGNOSIS — I4891 Unspecified atrial fibrillation: Secondary | ICD-10-CM

## 2011-10-26 DIAGNOSIS — I495 Sick sinus syndrome: Secondary | ICD-10-CM | POA: Diagnosis not present

## 2011-10-26 DIAGNOSIS — Z950 Presence of cardiac pacemaker: Secondary | ICD-10-CM

## 2011-10-26 LAB — PACEMAKER DEVICE OBSERVATION
AL THRESHOLD: 0.625 V
ATRIAL PACING PM: 52
BAMS-0003: 60 {beats}/min
DEVICE MODEL PM: 7198677
RV LEAD AMPLITUDE: 12 mv
RV LEAD THRESHOLD: 0.75 V

## 2011-10-26 NOTE — Assessment & Plan Note (Signed)
The patient's device was interrogated.  The information was reviewed. No changes were made in the programming.    

## 2011-10-26 NOTE — Progress Notes (Signed)
HPI  Cindy Cervantes is a 78 y.o. female Seen in followup for her paroxysmal atrial fibrillation with a rapid ventricular response and sinus node dysfunction for which she required pacemaker implantation in the fall of 2011. She has done much better since that time.   She takes amiodarone. She has tolerated this quite well. She is to have her surveillance laboratories drawn tomorrow.  She is using chronic oxygen.  She has not had chest pain and peripheral edema. Shortness of breath is stable  She has been seen for her anemia is felt to have a low-grade myelodysplastic syndrome  Past Medical History  Diagnosis Date  . Paroxysmal a-fib with RVR    Amiodarone  . History of hyperkalemia in setting of spironolactone 09/2010  . Sinus node dysfunction   . Pacemaker     Implanted December 2012  . Obesity   . Allergic rhinitis   . Hypertension   . Senile cataract   . Hyperlipidemia   . Xerostomia   . Dysphagia   . Cough     2nd to reflux  . Obstructive sleep apnea (adult) (pediatric)   . Diverticulosis of colon   . Obesity hypoventilation syndrome   . Colon polyp   . Tracheobronchomalacia   . Diaphragm dysfunction     Right hemidiaphragm  . Hypothyroidism   . Anemia of renal disease 10/21/2011  . Anemia, iron deficiency 10/21/2011  . MDS (myelodysplastic syndrome), low grade 10/21/2011    Past Surgical History  Procedure Date  . Tubal ligation 1964  . Total knee arthroplasty 1999    right knee  . Tummy tuck 2000    pt "almost died"; respiratory distress, became delrious  . Total knee arthroplasty 2003    left knee  . Cataract extraction 2008    Current Outpatient Prescriptions  Medication Sig Dispense Refill  . acetaminophen (TYLENOL) 325 MG tablet Take 650 mg by mouth every 6 (six) hours as needed.       Marland Kitchen amiodarone (PACERONE) 200 MG tablet Take 200 mg by mouth daily.       Marland Kitchen levothyroxine (SYNTHROID, LEVOTHROID) 25 MCG tablet Take 25 mcg by mouth daily.        .  magnesium oxide (MAG-OX) 400 MG tablet Take 400 mg by mouth daily. 1 tab daily       . niacin (NIASPAN) 500 MG CR tablet Take 500 mg by mouth at bedtime. 1 tab qhs       . NON FORMULARY 3 mLs. Oxygen      . omeprazole (PRILOSEC) 20 MG capsule Take 20 mg by mouth daily.        . polyethylene glycol powder (MIRALAX) powder Take 17 g by mouth daily. As directed        . potassium chloride SA (K-DUR,KLOR-CON) 20 MEQ tablet Take 20 mEq by mouth 2 (two) times daily.       . sertraline (ZOLOFT) 25 MG tablet Take 25 mg by mouth daily. 1 tab qd       . torsemide (DEMADEX) 20 MG tablet Take 20 mg by mouth 2 (two) times daily.       Marland Kitchen warfarin (COUMADIN) 1 MG tablet Use as directed  30 tablet  0  . warfarin (COUMADIN) 4 MG tablet Take by mouth daily. Take as directed        Allergies  Allergen Reactions  . Iron     By infusion  . Morphine   . Toviaz (Fesoterodine Fumarate)  Review of Systems negative except from HPI and PMH  Physical Exam BP 138/76  Pulse 68  Ht 5\' 4"  (1.626 m)  Wt 219 lb 1.9 oz (99.392 kg)  BMI 37.61 kg/m2 Well developed and well nourished in no acute distress wearing oxygen HENT normal E scleral and icterus clear Neck Supple   Clear to ausculation Regular rate and rhythm, no murmurs gallops or rub Soft with active bowel sounds No clubbing cyanosis none Edema Alert and oriented, grossly normal motor and sensory function Skin Warm and Dry   Assessment and  Plan

## 2011-10-26 NOTE — Assessment & Plan Note (Signed)
Stable post pacing 

## 2011-10-26 NOTE — Assessment & Plan Note (Signed)
She has had no atrial fibrillation detected by her device. As such we will decrease her dose of amiodarone from 1400 mg weekly to 1000 mg weekly; surveillance laboratories will be drawn tomorrow

## 2011-10-26 NOTE — Patient Instructions (Addendum)
Your physician has recommended you make the following change in your medication:  1) Decrease amiodarone to 200 mg one tablet by mouth 5 days a week.  Remote monitoring is used to monitor your Pacemaker of ICD from home. This monitoring reduces the number of office visits required to check your device to one time per year. It allows [place] to keep an eye on the functioning of your device to ensure it is working properly. You are scheduled for a device check from home on 01/26/12. You may send your transmission at any time that day. If you have a wireless device, the transmission will be sent automatically. After your physician reviews your transmission, you will receive a postcard with your next transmission date.  Your physician wants you to follow-up in: 6 months with Dr. Graciela Husbands. You will receive a reminder letter in the mail two months in advance. If you don't receive a letter, please call our office to schedule the follow-up appointment.

## 2011-10-27 DIAGNOSIS — I10 Essential (primary) hypertension: Secondary | ICD-10-CM | POA: Diagnosis not present

## 2011-10-27 DIAGNOSIS — E785 Hyperlipidemia, unspecified: Secondary | ICD-10-CM | POA: Diagnosis not present

## 2011-10-27 DIAGNOSIS — I4891 Unspecified atrial fibrillation: Secondary | ICD-10-CM | POA: Diagnosis not present

## 2011-11-02 DIAGNOSIS — E039 Hypothyroidism, unspecified: Secondary | ICD-10-CM | POA: Diagnosis not present

## 2011-11-02 DIAGNOSIS — E785 Hyperlipidemia, unspecified: Secondary | ICD-10-CM | POA: Diagnosis not present

## 2011-11-02 DIAGNOSIS — R0902 Hypoxemia: Secondary | ICD-10-CM | POA: Diagnosis not present

## 2011-11-02 DIAGNOSIS — D649 Anemia, unspecified: Secondary | ICD-10-CM | POA: Diagnosis not present

## 2011-11-02 DIAGNOSIS — G4733 Obstructive sleep apnea (adult) (pediatric): Secondary | ICD-10-CM | POA: Diagnosis not present

## 2011-11-02 DIAGNOSIS — I10 Essential (primary) hypertension: Secondary | ICD-10-CM | POA: Diagnosis not present

## 2011-11-08 ENCOUNTER — Telehealth: Payer: Self-pay | Admitting: Internal Medicine

## 2011-11-23 DIAGNOSIS — I4891 Unspecified atrial fibrillation: Secondary | ICD-10-CM | POA: Diagnosis not present

## 2011-11-23 DIAGNOSIS — Z7901 Long term (current) use of anticoagulants: Secondary | ICD-10-CM | POA: Diagnosis not present

## 2011-12-13 DIAGNOSIS — G839 Paralytic syndrome, unspecified: Secondary | ICD-10-CM | POA: Diagnosis not present

## 2011-12-13 DIAGNOSIS — E785 Hyperlipidemia, unspecified: Secondary | ICD-10-CM | POA: Diagnosis not present

## 2011-12-13 DIAGNOSIS — Z7901 Long term (current) use of anticoagulants: Secondary | ICD-10-CM | POA: Diagnosis not present

## 2012-01-04 ENCOUNTER — Other Ambulatory Visit (HOSPITAL_BASED_OUTPATIENT_CLINIC_OR_DEPARTMENT_OTHER): Payer: Medicare Other | Admitting: Lab

## 2012-01-04 ENCOUNTER — Ambulatory Visit (HOSPITAL_BASED_OUTPATIENT_CLINIC_OR_DEPARTMENT_OTHER): Payer: Medicare Other | Admitting: Hematology & Oncology

## 2012-01-04 VITALS — BP 153/78 | HR 87 | Temp 96.7°F | Ht 64.0 in | Wt 226.0 lb

## 2012-01-04 DIAGNOSIS — D631 Anemia in chronic kidney disease: Secondary | ICD-10-CM | POA: Diagnosis not present

## 2012-01-04 DIAGNOSIS — D509 Iron deficiency anemia, unspecified: Secondary | ICD-10-CM

## 2012-01-04 DIAGNOSIS — M6281 Muscle weakness (generalized): Secondary | ICD-10-CM | POA: Diagnosis not present

## 2012-01-04 DIAGNOSIS — D649 Anemia, unspecified: Secondary | ICD-10-CM | POA: Diagnosis not present

## 2012-01-04 DIAGNOSIS — D4620 Refractory anemia with excess of blasts, unspecified: Secondary | ICD-10-CM

## 2012-01-04 DIAGNOSIS — N039 Chronic nephritic syndrome with unspecified morphologic changes: Secondary | ICD-10-CM | POA: Diagnosis not present

## 2012-01-04 LAB — FERRITIN: Ferritin: 386 ng/mL — ABNORMAL HIGH (ref 10–291)

## 2012-01-04 LAB — CBC WITH DIFFERENTIAL (CANCER CENTER ONLY)
BASO%: 0.1 % (ref 0.0–2.0)
EOS%: 2.9 % (ref 0.0–7.0)
LYMPH#: 1.2 10*3/uL (ref 0.9–3.3)
LYMPH%: 16.4 % (ref 14.0–48.0)
MCHC: 31.8 g/dL — ABNORMAL LOW (ref 32.0–36.0)
MCV: 98 fL (ref 81–101)
MONO#: 0.5 10*3/uL (ref 0.1–0.9)
Platelets: 171 10*3/uL (ref 145–400)
RDW: 13.7 % (ref 11.1–15.7)
WBC: 7.2 10*3/uL (ref 3.9–10.0)

## 2012-01-04 LAB — IRON AND TIBC: TIBC: 268 ug/dL (ref 250–470)

## 2012-01-04 LAB — RETICULOCYTES (CHCC): Retic Ct Pct: 1.5 % (ref 0.4–2.3)

## 2012-01-04 NOTE — Progress Notes (Signed)
This office note has been dictated.

## 2012-01-05 NOTE — Progress Notes (Signed)
CC:   Lenon Curt. Chilton Si, M.D. Duke Salvia, MD, [facility] [place], [place] [phone]  DIAGNOSES: 1. Anemia of renal insufficiency. 2. Recurrent iron deficiency anemia. 3. Refractory anemia-low risk.  CURRENT THERAPY: 1. Aranesp 300 mcg subcentimeter as needed for hemoglobin less than     11. 2. IV iron as indicated.  INTERVAL HISTORY:  Ms. Brickhouse comes in for followup.  She is doing quite nicely.  She has had no real complaints since we last saw her. The last time that she got iron was back in July.  She has not had any Aranesp for several months.  She has had no problem with bleeding. Some of her medicines are being cut back, which is helping her out.  When we last saw her, her ferritin was 587.  Iron saturation was 18%.  Her appetite is good.  She had a nice birthday right before Easter.  Her family came in to see her.  PHYSICAL EXAM:  General: This is an elderly white female in no obvious distress.  Vital signs: Temperature 96.7, pulse 87, respiratory 18, blood pressure 153/78, and weight is 226.  Head and neck exam shows a normocephalic, atraumatic skull.  There are no ocular or oral lesions. There are no palpable cervical or supraclavicular lymph nodes.  Lungs: Clear bilaterally.  Cardiac exam: Regular rate and rhythm with a normal S1 and S2.  There are no murmurs, rubs, or bruits.  Abdominal exam: Soft with good bowel sounds.  There is no fluid wave.  There is no palpable hepatosplenomegaly.  Extremities: Shows some trace edema in her legs bilaterally.  Back exam:  No tenderness over the spine, ribs, or hips.  LABORATORY STUDIES:  White cell count is 7.2, hemoglobin 12.1, hematocrit 38.1, and platelet count is 171.  MCV is 98.  IMPRESSION:  Ms. Mcaulay is a very nice 78 year old white female with multifactorial anemia.  She is out of [facility]. She is doing well there.  Again, her blood counts really look nice.  I do not see  that we need to do anything with her.  I believe that we can probably get her back now, I would think, in about 4 months.  We have not had to give her any iron or Aranesp for several months, and this certainly is encouraging.  I think the less medicine that she is on, the better her bone marrow will function.    ______________________________ Josph Macho, M.D. PRE/MEDQ  D:  01/04/2012  T:  01/04/2012  Job:  [id number]

## 2012-01-09 DIAGNOSIS — M6281 Muscle weakness (generalized): Secondary | ICD-10-CM | POA: Diagnosis not present

## 2012-01-18 DIAGNOSIS — E669 Obesity, unspecified: Secondary | ICD-10-CM | POA: Diagnosis not present

## 2012-01-18 DIAGNOSIS — M6281 Muscle weakness (generalized): Secondary | ICD-10-CM | POA: Diagnosis not present

## 2012-01-18 DIAGNOSIS — I10 Essential (primary) hypertension: Secondary | ICD-10-CM | POA: Diagnosis not present

## 2012-01-18 DIAGNOSIS — E785 Hyperlipidemia, unspecified: Secondary | ICD-10-CM | POA: Diagnosis not present

## 2012-01-18 DIAGNOSIS — Z7901 Long term (current) use of anticoagulants: Secondary | ICD-10-CM | POA: Diagnosis not present

## 2012-01-18 DIAGNOSIS — I4891 Unspecified atrial fibrillation: Secondary | ICD-10-CM | POA: Diagnosis not present

## 2012-01-19 DIAGNOSIS — M6281 Muscle weakness (generalized): Secondary | ICD-10-CM | POA: Diagnosis not present

## 2012-01-25 DIAGNOSIS — M6281 Muscle weakness (generalized): Secondary | ICD-10-CM | POA: Diagnosis not present

## 2012-01-26 ENCOUNTER — Ambulatory Visit (INDEPENDENT_AMBULATORY_CARE_PROVIDER_SITE_OTHER): Payer: Medicare Other | Admitting: *Deleted

## 2012-01-26 DIAGNOSIS — I4891 Unspecified atrial fibrillation: Secondary | ICD-10-CM | POA: Diagnosis not present

## 2012-01-26 DIAGNOSIS — I495 Sick sinus syndrome: Secondary | ICD-10-CM

## 2012-01-26 DIAGNOSIS — M6281 Muscle weakness (generalized): Secondary | ICD-10-CM | POA: Diagnosis not present

## 2012-01-27 ENCOUNTER — Encounter: Payer: Self-pay | Admitting: Internal Medicine

## 2012-01-27 DIAGNOSIS — M6281 Muscle weakness (generalized): Secondary | ICD-10-CM | POA: Diagnosis not present

## 2012-01-27 LAB — REMOTE PACEMAKER DEVICE
AL IMPEDENCE PM: 510 Ohm
AL THRESHOLD: 0.625 V
ATRIAL PACING PM: 63
BAMS-0003: 60 {beats}/min
BATTERY VOLTAGE: 2.96 V
VENTRICULAR PACING PM: 1

## 2012-01-31 ENCOUNTER — Encounter: Payer: Self-pay | Admitting: *Deleted

## 2012-01-31 DIAGNOSIS — M6281 Muscle weakness (generalized): Secondary | ICD-10-CM | POA: Diagnosis not present

## 2012-01-31 NOTE — Progress Notes (Signed)
Remote pacer check  

## 2012-02-02 DIAGNOSIS — M6281 Muscle weakness (generalized): Secondary | ICD-10-CM | POA: Diagnosis not present

## 2012-02-07 DIAGNOSIS — M6281 Muscle weakness (generalized): Secondary | ICD-10-CM | POA: Diagnosis not present

## 2012-02-08 DIAGNOSIS — M6281 Muscle weakness (generalized): Secondary | ICD-10-CM | POA: Diagnosis not present

## 2012-02-14 DIAGNOSIS — M6281 Muscle weakness (generalized): Secondary | ICD-10-CM | POA: Diagnosis not present

## 2012-02-16 DIAGNOSIS — M6281 Muscle weakness (generalized): Secondary | ICD-10-CM | POA: Diagnosis not present

## 2012-02-22 DIAGNOSIS — Z7901 Long term (current) use of anticoagulants: Secondary | ICD-10-CM | POA: Diagnosis not present

## 2012-02-22 DIAGNOSIS — I4891 Unspecified atrial fibrillation: Secondary | ICD-10-CM | POA: Diagnosis not present

## 2012-03-05 ENCOUNTER — Ambulatory Visit (INDEPENDENT_AMBULATORY_CARE_PROVIDER_SITE_OTHER): Payer: Medicare Other | Admitting: Pulmonary Disease

## 2012-03-05 ENCOUNTER — Encounter: Payer: Self-pay | Admitting: Pulmonary Disease

## 2012-03-05 VITALS — BP 132/62 | HR 91 | Temp 97.6°F | Ht 64.0 in | Wt 228.6 lb

## 2012-03-05 DIAGNOSIS — J398 Other specified diseases of upper respiratory tract: Secondary | ICD-10-CM | POA: Diagnosis not present

## 2012-03-05 DIAGNOSIS — R0602 Shortness of breath: Secondary | ICD-10-CM | POA: Diagnosis not present

## 2012-03-05 DIAGNOSIS — J988 Other specified respiratory disorders: Secondary | ICD-10-CM

## 2012-03-05 DIAGNOSIS — G4733 Obstructive sleep apnea (adult) (pediatric): Secondary | ICD-10-CM

## 2012-03-05 DIAGNOSIS — G4736 Sleep related hypoventilation in conditions classified elsewhere: Secondary | ICD-10-CM | POA: Diagnosis not present

## 2012-03-05 NOTE — Assessment & Plan Note (Signed)
She is to continue with 2 liters oxygen with exertion and sleep. 

## 2012-03-05 NOTE — Assessment & Plan Note (Signed)
Multifactorial. She has diastolic dysfuntion, obesity with deconditioning and hypoventilation, chronic right hemidiaphragm elevation, and tracheobronchomalacia on CT chest.   

## 2012-03-05 NOTE — Assessment & Plan Note (Addendum)
She is to continue BPAP 16/13 cm H2O.  Will get BPAP download and call her with results.  Will also have her DME assess her humidifier set up.

## 2012-03-05 NOTE — Assessment & Plan Note (Signed)
She is to continue supplemental oxygen and BPAP at night.  She appears reasonably compensated, and therefore don't think she needs airway stenting at this time.   

## 2012-03-05 NOTE — Progress Notes (Signed)
Chief Complaint  Patient presents with  . 6 month follow-up    Patient states better since last visit. c/o sob, cough with green mucus. Denies wheezing, chest pain, and chest tightness.     History of Present Illness: Cindy Cervantes is a 78 y.o. female former smoker with cough 2nd to GERD, dyspnea, OSA/OHS, tracheobronchomalacia, and right hemidiaphragm elevation on BPAP and 2 liters oxygen with exertion and sleep.  She resides at [facility].  She uses her oxygen just about all the time.  She will occasionally take a break from using it when at rest.  She has a portable pulse oximeter.  She is going to start an exercise program at her community home.  She is able to keep up with light activity, but has to stop and rest with moderate activity.  She feels BPAP has been working well.  She goes to bed at 11 pm and wakes up at 7 am.  She sleeps through the night.  She has noticed more trouble with mouth dryness.  Her BPAP machine does not seem to be using the water in her humidifier.  The water can now last 2 to 3 days, where as before it would only last 1 night.  She is using distilled water.  She feels her mask fits okay.  Past Medical History  Diagnosis Date  . Paroxysmal a-fib with RVR    Amiodarone  . History of hyperkalemia in setting of spironolactone 09/2010  . Sinus node dysfunction   . Pacemaker     Implanted December 2012  . Obesity   . Allergic rhinitis   . Hypertension   . Senile cataract   . Hyperlipidemia   . Xerostomia   . Dysphagia   . Cough     2nd to reflux  . Obstructive sleep apnea (adult) (pediatric)   . Diverticulosis of colon   . Obesity hypoventilation syndrome   . Colon polyp   . Tracheobronchomalacia   . Diaphragm dysfunction     Right hemidiaphragm  . Hypothyroidism   . Anemia of renal disease 10/21/2011  . Anemia, iron deficiency 10/21/2011  . MDS (myelodysplastic syndrome), low grade 10/21/2011    Past Surgical History    Procedure Date  . Tubal ligation 1964  . Total knee arthroplasty 1999    right knee  . Tummy tuck 2000    pt "almost died"; respiratory distress, became delrious  . Total knee arthroplasty 2003    left knee  . Cataract extraction 2008    Allergies  Allergen Reactions  . Iron     By infusion  . Morphine   . Toviaz (Fesoterodine Fumarate)     Physical Exam:  Blood pressure 132/62, pulse 91, temperature 97.6 F (36.4 C), temperature source Oral, height 5\' 4"  (1.626 m), weight 228 lb 9.6 oz (103.692 kg), SpO2 91.00%.  Body mass index is 39.24 kg/(m^2).  Wt Readings from Last 3 Encounters:  03/05/12 228 lb 9.6 oz (103.692 kg)  01/04/12 226 lb (102.513 kg)  10/26/11 219 lb 1.9 oz (99.392 kg)    General - Obese, using supplemental oxygen HEENT - no sinus tenderness, no oral exudate, no LAN Cardiac - s1s2 regular, no murmur Chest - no wheeze/rales/dullness Abdomen - soft, nontender Extremities - no edema Skin - no rashes Neurologic - normal strength Psychiatric - normal mood, behavior   Assessment/Plan:    Outpatient Encounter Prescriptions as of 03/05/2012  Medication Sig Dispense Refill  . acetaminophen (TYLENOL) 325 MG tablet  Take 650 mg by mouth every 6 (six) hours as needed.       Marland Kitchen amiodarone (PACERONE) 200 MG tablet Take one tablet by mouth 5 days a week      . Calcium-Vitamin D (CALTRATE 600 PLUS-VIT D PO) Take 600 mg by mouth 2 (two) times daily.      Marland Kitchen ezetimibe (ZETIA) 10 MG tablet Take 10 mg by mouth daily.      Marland Kitchen levothyroxine (SYNTHROID, LEVOTHROID) 25 MCG tablet Take 50 mcg by mouth daily.       . magnesium oxide (MAG-OX) 400 MG tablet Take 400 mg by mouth daily. 1 tab daily       . niacin (NIASPAN) 500 MG CR tablet Take 500 mg by mouth at bedtime. 1 tab qhs       . omeprazole (PRILOSEC) 20 MG capsule Take 20 mg by mouth daily.        . polyethylene glycol powder (MIRALAX) powder Take 17 g by mouth daily. As directed        . potassium chloride SA  (K-DUR,KLOR-CON) 20 MEQ tablet Take 10 mEq by mouth daily.       . sertraline (ZOLOFT) 25 MG tablet Take 25 mg by mouth daily. 1 tab qd       . torsemide (DEMADEX) 20 MG tablet Take 20 mg by mouth daily.       Marland Kitchen warfarin (COUMADIN) 1 MG tablet Use as directed  30 tablet  0  . warfarin (COUMADIN) 4 MG tablet Take by mouth daily. Take as directed      . NON FORMULARY 1.5 L/min. Oxygen        Cindy Cervantes Pager:  [phone] 03/05/2012, 9:49 AM

## 2012-03-05 NOTE — Patient Instructions (Signed)
Follow up in 6 months 

## 2012-03-13 ENCOUNTER — Telehealth: Payer: Self-pay | Admitting: Pulmonary Disease

## 2012-03-13 DIAGNOSIS — G4733 Obstructive sleep apnea (adult) (pediatric): Secondary | ICD-10-CM

## 2012-03-13 NOTE — Telephone Encounter (Signed)
BPAP 02/01/11 to 03/04/12>>Used on 386 of 398 nights with average 7 hrs 33 min.  Average AHI 2.7 with BPAP 16/13 cm H2O.  Minimal airleak.  Tv 460, Ve 6.1, RR 15.  D/w patient.  Will change to BPAP 15/12 cm H2O to see if this improves dryness.  She is to call back if she has trouble with her sleep after change in pressure.

## 2012-03-28 DIAGNOSIS — Z7901 Long term (current) use of anticoagulants: Secondary | ICD-10-CM | POA: Diagnosis not present

## 2012-03-28 DIAGNOSIS — I4891 Unspecified atrial fibrillation: Secondary | ICD-10-CM | POA: Diagnosis not present

## 2012-04-04 ENCOUNTER — Encounter: Payer: Self-pay | Admitting: *Deleted

## 2012-04-11 ENCOUNTER — Encounter: Payer: Self-pay | Admitting: Internal Medicine

## 2012-04-11 ENCOUNTER — Ambulatory Visit (INDEPENDENT_AMBULATORY_CARE_PROVIDER_SITE_OTHER): Payer: Medicare Other | Admitting: Internal Medicine

## 2012-04-11 VITALS — BP 126/66 | HR 82 | Ht 64.0 in | Wt 228.0 lb

## 2012-04-11 DIAGNOSIS — I4891 Unspecified atrial fibrillation: Secondary | ICD-10-CM | POA: Diagnosis not present

## 2012-04-11 DIAGNOSIS — I495 Sick sinus syndrome: Secondary | ICD-10-CM

## 2012-04-11 LAB — PACEMAKER DEVICE OBSERVATION
AL IMPEDENCE PM: 487.5 Ohm
AL THRESHOLD: 0.5 V
BAMS-0003: 60 {beats}/min
RV LEAD AMPLITUDE: 12 mv

## 2012-04-11 NOTE — Patient Instructions (Addendum)
Remote monitoring is used to monitor your Pacemaker of ICD from home. This monitoring reduces the number of office visits required to check your device to one time per year. It allows [place] to keep an eye on the functioning of your device to ensure it is working properly. You are scheduled for a device check from home on 07/16/12. You may send your transmission at any time that day. If you have a wireless device, the transmission will be sent automatically. After your physician reviews your transmission, you will receive a postcard with your next transmission date.   Your physician wants you to follow-up in: 6 months with Dr. Graciela Husbands. You will receive a reminder letter in the mail two months in advance. If you don't receive a letter, please call our office to schedule the follow-up appointment.   Your physician has recommended you make the following change in your medication:  1) Decrease amiodarone to 200 mg 1/2 by mouth once daily.

## 2012-04-11 NOTE — Assessment & Plan Note (Signed)
No intercurrent atrial fibrillation. We'll decrease her amiodarone from 1000-700 mg a day. Amiodarone surveillance labs were reviewed they were normal in March orApril.

## 2012-04-11 NOTE — Progress Notes (Signed)
HPI  Cindy Cervantes is a 78 y.o. female Seen in followup for her paroxysmal atrial fibrillation with a rapid ventricular response and sinus node dysfunction for which she required pacemaker implantation in the fall of 2011. She has done much better since that time.  She takes amiodarone. She has tolerated this quite well. She is to have her surveillance laboratories drawn tomorrow.  She is using chronic oxygen.    She has not had chest pain and peripheral edema. Shortness of breath is stable  She has been seen for her anemia is felt to have a low-grade myelodysplastic syndrome   At the last visit we decreased her amiodarone from 1400-1000 mg a week. There has been no intercurrent atrial fibrillation   Past Medical History  Diagnosis Date  . Paroxysmal a-fib with RVR    Amiodarone  . History of hyperkalemia in setting of spironolactone 09/2010  . Sinus node dysfunction   . Pacemaker     Implanted December 2012  . Obesity   . Allergic rhinitis   . Hypertension   . Senile cataract   . Hyperlipidemia   . Xerostomia   . Dysphagia   . Cough     2nd to reflux  . Obstructive sleep apnea (adult) (pediatric)   . Diverticulosis of colon   . Obesity hypoventilation syndrome   . Colon polyp   . Tracheobronchomalacia   . Diaphragm dysfunction     Right hemidiaphragm  . Hypothyroidism   . Anemia of renal disease 10/21/2011  . Anemia, iron deficiency 10/21/2011  . MDS (myelodysplastic syndrome), low grade 10/21/2011    Past Surgical History  Procedure Date  . Tubal ligation 1964  . Total knee arthroplasty 1999    right knee  . Tummy tuck 2000    pt "almost died"; respiratory distress, became delrious  . Total knee arthroplasty 2003    left knee  . Cataract extraction 2008    Current Outpatient Prescriptions  Medication Sig Dispense Refill  . acetaminophen (TYLENOL) 325 MG tablet Take 650 mg by mouth every 6 (six) hours as needed.       Marland Kitchen amiodarone (PACERONE) 200 MG tablet  Take one tablet by mouth 5 days a week      . Calcium-Vitamin D (CALTRATE 600 PLUS-VIT D PO) Take 600 mg by mouth 2 (two) times daily.      Marland Kitchen ezetimibe (ZETIA) 10 MG tablet Take 10 mg by mouth daily.      Marland Kitchen levothyroxine (SYNTHROID, LEVOTHROID) 25 MCG tablet Take 50 mcg by mouth daily.       . magnesium oxide (MAG-OX) 400 MG tablet Take 400 mg by mouth daily. 1 tab daily       . niacin (NIASPAN) 500 MG CR tablet Take 500 mg by mouth at bedtime. 1 tab qhs       . NON FORMULARY 1.5 L/min. Oxygen      . omeprazole (PRILOSEC) 20 MG capsule Take 20 mg by mouth daily.        . polyethylene glycol powder (MIRALAX) powder Take 17 g by mouth daily. As directed        . potassium chloride SA (K-DUR,KLOR-CON) 20 MEQ tablet Take 10 mEq by mouth daily.       . sertraline (ZOLOFT) 25 MG tablet Take 25 mg by mouth daily. 1 tab qd       . torsemide (DEMADEX) 20 MG tablet Take 20 mg by mouth daily.       Marland Kitchen warfarin (  COUMADIN) 4 MG tablet Take 2.5 mg by mouth daily. Take as directed      . warfarin (COUMADIN) 1 MG tablet Use as directed  30 tablet  0    Allergies  Allergen Reactions  . Iron     By infusion  . Morphine   . Toviaz (Fesoterodine Fumarate)     Review of Systems negative except from HPI and PMH  Physical Exam BP 126/66  Pulse 82  Ht 5\' 4"  (1.626 m)  Wt 228 lb (103.42 kg)  BMI 39.14 kg/m2 Well developed and well nourished in no acute distress but using oxygen HENT normal E scleral and icterus clear Neck Supple JVP flat; carotids brisk and full Clear to ausculation Regular rate and rhythm, no murmurs gallops or rub Soft with active bowel sounds No clubbing cyanosis no Edema Alert and oriented, grossly normal motor and sensory function Skin Warm and Dry  Electrocardiogram today demonstrates sinus rhythm at 82 Intervals 16/09/38 Axis is leftward at -29 Occasional PVCs  Assessment and  Plan

## 2012-05-02 DIAGNOSIS — Z7901 Long term (current) use of anticoagulants: Secondary | ICD-10-CM | POA: Diagnosis not present

## 2012-05-02 DIAGNOSIS — I5032 Chronic diastolic (congestive) heart failure: Secondary | ICD-10-CM | POA: Diagnosis not present

## 2012-05-02 DIAGNOSIS — I4891 Unspecified atrial fibrillation: Secondary | ICD-10-CM | POA: Diagnosis not present

## 2012-05-03 ENCOUNTER — Other Ambulatory Visit (HOSPITAL_BASED_OUTPATIENT_CLINIC_OR_DEPARTMENT_OTHER): Payer: Medicare Other | Admitting: Lab

## 2012-05-03 ENCOUNTER — Ambulatory Visit (HOSPITAL_BASED_OUTPATIENT_CLINIC_OR_DEPARTMENT_OTHER): Payer: Medicare Other | Admitting: Hematology & Oncology

## 2012-05-03 VITALS — BP 145/76 | HR 70 | Temp 96.8°F | Wt 228.0 lb

## 2012-05-03 DIAGNOSIS — D4620 Refractory anemia with excess of blasts, unspecified: Secondary | ICD-10-CM | POA: Diagnosis not present

## 2012-05-03 DIAGNOSIS — D649 Anemia, unspecified: Secondary | ICD-10-CM | POA: Diagnosis not present

## 2012-05-03 DIAGNOSIS — D631 Anemia in chronic kidney disease: Secondary | ICD-10-CM | POA: Diagnosis not present

## 2012-05-03 DIAGNOSIS — D509 Iron deficiency anemia, unspecified: Secondary | ICD-10-CM

## 2012-05-03 DIAGNOSIS — N039 Chronic nephritic syndrome with unspecified morphologic changes: Secondary | ICD-10-CM | POA: Diagnosis not present

## 2012-05-03 LAB — CBC WITH DIFFERENTIAL (CANCER CENTER ONLY)
BASO#: 0 10*3/uL (ref 0.0–0.2)
Eosinophils Absolute: 0.3 10*3/uL (ref 0.0–0.5)
HGB: 12.1 g/dL (ref 11.6–15.9)
LYMPH%: 20 % (ref 14.0–48.0)
MCH: 31.3 pg (ref 26.0–34.0)
MCV: 98 fL (ref 81–101)
MONO%: 7.3 % (ref 0.0–13.0)
NEUT%: 69.2 % (ref 39.6–80.0)
RBC: 3.86 10*6/uL (ref 3.70–5.32)

## 2012-05-03 LAB — RETICULOCYTES (CHCC)
RBC.: 3.96 MIL/uL (ref 3.87–5.11)
Retic Ct Pct: 1.2 % (ref 0.4–2.3)

## 2012-05-03 NOTE — Progress Notes (Signed)
This office note has been dictated.

## 2012-05-04 NOTE — Progress Notes (Signed)
CC:   [address] [place], [place] [phone]) Duke Salvia, MD, [facility] Lenon Curt. Chilton Si, M.D.  DIAGNOSES: 1. Anemia of renal insufficiency. 2. Recurrent iron deficiency anemia. 3. Refractory anemia, low grade.  CURRENT THERAPY: 1. Aranesp 300 mcg subcu as needed for hemoglobin less than 11. 2. IV iron as indicated.  INTERIM HISTORY:  Ms. Todorov comes in for followup.  She really looks good.  We last saw her back in April.  Since then, she has had no issues.  She does feel tired.  When we saw her in April, ferritin was 386.  Iron saturation was 18%.  For Ms. Gerre Pebbles, this is a little on the border, and we may have to consider giving her treatment with iron. She has had no bleeding.  She is on oxygen.  She has had no issues with her heart or lungs. Her appetite has been quite good.  She had her diuretics increased because of some increased swelling in her legs.  PHYSICAL EXAMINATION:  General: This is an elderly, white female in no obvious distress.  Vital signs:  Temperature of 96.8, pulse 70, respiratory rate 24, blood pressure is 145/76.  Weight is 228.  Head and neck:  Normocephalic, atraumatic skull.  There are no ocular or oral lesions.  There are no palpable cervical or supraclavicular lymph nodes. Lungs:  Clear bilaterally.  Cardiac:  Regular rate and rhythm with a normal S1 and S2.  There are no murmurs, rubs or bruits.  Abdomen:  Soft with good bowel sounds.  There is no fluid wave.  There is no palpable hepatosplenomegaly.  Back:  No tenderness over the spine, ribs, or hips. Extremities:  Shows no clubbing, cyanosis or edema.  Neurologic:  No focal neurological deficits.  LABORATORY STUDIES:  White cell count 7.9, low 12.1, hematocrit 37.8, platelet count is 184.  MCV is 98.  IMPRESSION:  Ms. Bolinger is a 78 year old, white female with anemia that is multifactorial.  We do not need to give her any Aranesp.  It is certainly possible that we may have to give  her iron.  Again, we will see what her iron studies show.  We will plan to get Ms. Murfin back after the holidays.  I want to see her back in January.  We will call her with the iron studies.  I am just glad to see that overall, her quality of life has been doing pretty well.    ______________________________ Josph Macho, M.D. PRE/MEDQ  D:  05/03/2012  T:  05/04/2012  Job:  [id number]

## 2012-05-07 ENCOUNTER — Telehealth: Payer: Self-pay | Admitting: *Deleted

## 2012-05-07 NOTE — Telephone Encounter (Signed)
Called patients daughter to let her know that her moms iron levels were great.  Left message on personal cell phone

## 2012-05-07 NOTE — Telephone Encounter (Signed)
Message copied by Anselm Jungling on Mon May 07, 2012 10:36 AM ------      Message from: Josph Macho      Created: Sat May 05, 2012 12:18 PM       Call her dgtr - iron is great for her mom!!!  Cindee Lame

## 2012-05-10 DIAGNOSIS — Z79899 Other long term (current) drug therapy: Secondary | ICD-10-CM | POA: Diagnosis not present

## 2012-05-10 DIAGNOSIS — I5032 Chronic diastolic (congestive) heart failure: Secondary | ICD-10-CM | POA: Diagnosis not present

## 2012-05-30 ENCOUNTER — Other Ambulatory Visit: Payer: Self-pay | Admitting: Internal Medicine

## 2012-05-30 DIAGNOSIS — Z1231 Encounter for screening mammogram for malignant neoplasm of breast: Secondary | ICD-10-CM

## 2012-05-31 DIAGNOSIS — L719 Rosacea, unspecified: Secondary | ICD-10-CM | POA: Diagnosis not present

## 2012-05-31 DIAGNOSIS — L821 Other seborrheic keratosis: Secondary | ICD-10-CM | POA: Diagnosis not present

## 2012-05-31 DIAGNOSIS — D239 Other benign neoplasm of skin, unspecified: Secondary | ICD-10-CM | POA: Diagnosis not present

## 2012-06-06 DIAGNOSIS — Z7901 Long term (current) use of anticoagulants: Secondary | ICD-10-CM | POA: Diagnosis not present

## 2012-06-06 DIAGNOSIS — I4891 Unspecified atrial fibrillation: Secondary | ICD-10-CM | POA: Diagnosis not present

## 2012-06-21 ENCOUNTER — Ambulatory Visit
Admission: RE | Admit: 2012-06-21 | Discharge: 2012-06-21 | Disposition: A | Discharge status: Home / Self Care | Payer: Medicare Other | Source: Ambulatory Visit | Attending: Internal Medicine | Admitting: Internal Medicine

## 2012-06-21 DIAGNOSIS — Z1231 Encounter for screening mammogram for malignant neoplasm of breast: Secondary | ICD-10-CM | POA: Diagnosis not present

## 2012-06-25 DIAGNOSIS — B029 Zoster without complications: Secondary | ICD-10-CM | POA: Diagnosis not present

## 2012-07-10 DIAGNOSIS — Z23 Encounter for immunization: Secondary | ICD-10-CM | POA: Diagnosis not present

## 2012-07-11 DIAGNOSIS — I4891 Unspecified atrial fibrillation: Secondary | ICD-10-CM | POA: Diagnosis not present

## 2012-07-11 DIAGNOSIS — Z7901 Long term (current) use of anticoagulants: Secondary | ICD-10-CM | POA: Diagnosis not present

## 2012-07-16 ENCOUNTER — Encounter: Payer: Medicare Other | Admitting: *Deleted

## 2012-07-16 ENCOUNTER — Encounter: Payer: Self-pay | Admitting: Internal Medicine

## 2012-07-16 DIAGNOSIS — I495 Sick sinus syndrome: Secondary | ICD-10-CM

## 2012-07-17 LAB — REMOTE PACEMAKER DEVICE
AL IMPEDENCE PM: 450 Ohm
AL THRESHOLD: 0.5 V
ATRIAL PACING PM: 61
DEVICE MODEL PM: 7198677
RV LEAD AMPLITUDE: 12 mv
RV LEAD IMPEDENCE PM: 750 Ohm

## 2012-08-02 ENCOUNTER — Encounter: Payer: Self-pay | Admitting: *Deleted

## 2012-08-07 DIAGNOSIS — I10 Essential (primary) hypertension: Secondary | ICD-10-CM | POA: Diagnosis not present

## 2012-08-07 DIAGNOSIS — D649 Anemia, unspecified: Secondary | ICD-10-CM | POA: Diagnosis not present

## 2012-08-07 DIAGNOSIS — Z79899 Other long term (current) drug therapy: Secondary | ICD-10-CM | POA: Diagnosis not present

## 2012-08-07 DIAGNOSIS — Z7901 Long term (current) use of anticoagulants: Secondary | ICD-10-CM | POA: Diagnosis not present

## 2012-09-05 ENCOUNTER — Encounter: Payer: Self-pay | Admitting: Pulmonary Disease

## 2012-09-05 ENCOUNTER — Ambulatory Visit (INDEPENDENT_AMBULATORY_CARE_PROVIDER_SITE_OTHER)
Admission: RE | Admit: 2012-09-05 | Discharge: 2012-09-05 | Disposition: A | Discharge status: Home / Self Care | Payer: Medicare Other | Source: Ambulatory Visit | Attending: Pulmonary Disease | Admitting: Pulmonary Disease

## 2012-09-05 ENCOUNTER — Ambulatory Visit (INDEPENDENT_AMBULATORY_CARE_PROVIDER_SITE_OTHER): Payer: Medicare Other | Admitting: Pulmonary Disease

## 2012-09-05 VITALS — BP 120/62 | HR 63 | Temp 98.3°F | Ht 64.0 in | Wt 231.8 lb

## 2012-09-05 DIAGNOSIS — G4736 Sleep related hypoventilation in conditions classified elsewhere: Secondary | ICD-10-CM | POA: Diagnosis not present

## 2012-09-05 DIAGNOSIS — I4891 Unspecified atrial fibrillation: Secondary | ICD-10-CM | POA: Diagnosis not present

## 2012-09-05 DIAGNOSIS — J984 Other disorders of lung: Secondary | ICD-10-CM | POA: Diagnosis not present

## 2012-09-05 DIAGNOSIS — R0602 Shortness of breath: Secondary | ICD-10-CM

## 2012-09-05 DIAGNOSIS — J398 Other specified diseases of upper respiratory tract: Secondary | ICD-10-CM | POA: Diagnosis not present

## 2012-09-05 DIAGNOSIS — G4733 Obstructive sleep apnea (adult) (pediatric): Secondary | ICD-10-CM | POA: Diagnosis not present

## 2012-09-05 DIAGNOSIS — J988 Other specified respiratory disorders: Secondary | ICD-10-CM | POA: Diagnosis not present

## 2012-09-05 DIAGNOSIS — Z7901 Long term (current) use of anticoagulants: Secondary | ICD-10-CM | POA: Diagnosis not present

## 2012-09-05 NOTE — Progress Notes (Signed)
Chief Complaint  Patient presents with  . Follow-up    wears bpap everynight w/ 2 liters oxygen x 8 hrs a night. no problems with it. breathing has been okay. c/o left nostril is dried-using nasal saline oint.     History of Present Illness: Cindy Cervantes is a 78 y.o. female former smoker with cough 2nd to GERD, OSA/OHS, tracheobronchomalacia, and Rt hemidiaphragm elevation.  She has been doing well.  She is not as active as she would like.  She has been gaining weight, and had her diuretic dose increased.  She is not having cough, wheeze, or sputum.  She is doing well with BiPAP and sleeping well.  She has noticed some dryness in her left nasal passage.  Tests: PFT's March 29 2010 >> FEV1 1.34 (72) with ratio 75 and ERV 42, DLC0 55 > corrects to 137 CT chest 10/17/10>>GGO LUL, no PE, tracheobronchiomalacia involving the visualized trachea with areas of air trapping seen throughout both lungs PSG 12/14/10>>AHI 17.9, REM 40.8 BPAP titraton 01/12/11>>18/13 cm H2O with 3 liters oxygen. BPAP 02/01/11 to 03/04/12>>Used on 386 of 398 nights with average 7 hrs 33 min.  Average AHI 2.7 with BPAP 16/13 cm H2O.  Minimal airleak.  Tv 460, Ve 6.1, RR 15. 03/12/12 Change to BPAP 15/12 cm H2O  Past Medical History  Diagnosis Date  . Paroxysmal a-fib with RVR    Amiodarone  . History of hyperkalemia in setting of spironolactone 09/2010  . Sinus node dysfunction   . Pacemaker     Implanted December 2012  . Obesity   . Allergic rhinitis   . Hypertension   . Senile cataract   . Hyperlipidemia   . Xerostomia   . Dysphagia   . Cough     2nd to reflux  . Obstructive sleep apnea (adult) (pediatric)   . Diverticulosis of colon   . Obesity hypoventilation syndrome   . Colon polyp   . Tracheobronchomalacia   . Diaphragm dysfunction     Right hemidiaphragm  . Hypothyroidism   . Anemia of renal disease 10/21/2011  . Anemia, iron deficiency 10/21/2011  . MDS (myelodysplastic syndrome), low grade  10/21/2011    Past Surgical History  Procedure Date  . Tubal ligation 1964  . Total knee arthroplasty 1999    right knee  . Tummy tuck 2000    pt "almost died"; respiratory distress, became delrious  . Total knee arthroplasty 2003    left knee  . Cataract extraction 2008    Current Outpatient Prescriptions on File Prior to Visit  Medication Sig Dispense Refill  . acetaminophen (TYLENOL) 325 MG tablet Take 650 mg by mouth every 6 (six) hours as needed.       Marland Kitchen amiodarone (PACERONE) 200 MG tablet Take 100 mg by mouth daily.       . Calcium-Vitamin D (CALTRATE 600 PLUS-VIT D PO) Take 600 mg by mouth 2 (two) times daily.      Marland Kitchen ezetimibe (ZETIA) 10 MG tablet Take 10 mg by mouth daily.      Marland Kitchen levothyroxine (SYNTHROID, LEVOTHROID) 25 MCG tablet Take 50 mcg by mouth daily.       . magnesium oxide (MAG-OX) 400 MG tablet Take 400 mg by mouth daily. 1 tab daily       . niacin (NIASPAN) 500 MG CR tablet Take 1,000 mg by mouth at bedtime.       . NON FORMULARY 1.5 L/min. Oxygen      . omeprazole (PRILOSEC)  20 MG capsule Take 20 mg by mouth daily.        . polyethylene glycol powder (MIRALAX) powder Take 17 g by mouth daily. As directed      . potassium chloride SA (K-DUR,KLOR-CON) 20 MEQ tablet Take 20 mEq by mouth daily.       . sertraline (ZOLOFT) 25 MG tablet Take 25 mg by mouth daily. 1 tab qd       . torsemide (DEMADEX) 20 MG tablet Take 40 mg by mouth daily.       Marland Kitchen warfarin (COUMADIN) 1 MG tablet Use as directed  30 tablet  0  . warfarin (COUMADIN) 4 MG tablet Take 2.5 mg by mouth daily. Take as directed        Allergies  Allergen Reactions  . Iron     By infusion  . Morphine   . Toviaz (Fesoterodine Fumarate)     Physical Exam: Filed Vitals:   09/05/12 1331 09/05/12 1333  BP:  120/62  Pulse:  63  Temp: 98.3 F (36.8 C)   TempSrc: Oral   Height: 5\' 4"  (1.626 m)   Weight: 231 lb 12.8 oz (105.144 kg)   SpO2:  98%  ,  Current Encounter SPO2  09/05/12 1333 98%  05/03/12  1000 97%  03/05/12 0928 91%    Wt Readings from Last 3 Encounters:  09/05/12 231 lb 12.8 oz (105.144 kg)  05/03/12 228 lb (103.42 kg)  04/11/12 228 lb (103.42 kg)    Body mass index is 39.79 kg/(m^2).   General - No distress, wearing oxygen ENT - No sinus tenderness, no oral exudate, no LAN Cardiac - s1s2 regular, no murmur Chest - decreased breath sounds Rt base, no wheeze/rales Back - No focal tenderness Abd - Soft, non-tender Ext - No edema Neuro - Normal strength Skin - No rashes Psych - Normal mood, and behavior   Assessment/Plan:  Coralyn Helling, MD [facility] Pager:  [phone] 09/05/2012, 1:37 PM

## 2012-09-05 NOTE — Patient Instructions (Signed)
Chest xray today >> will call with results Follow up in 6 months 

## 2012-09-05 NOTE — Assessment & Plan Note (Signed)
F/u CXR today

## 2012-09-05 NOTE — Assessment & Plan Note (Signed)
Doing well with BiPAP. 

## 2012-09-05 NOTE — Assessment & Plan Note (Signed)
Continue supplemental oxygen with exertion and sleep.

## 2012-09-06 ENCOUNTER — Telehealth: Payer: Self-pay | Admitting: Pulmonary Disease

## 2012-09-06 NOTE — Telephone Encounter (Signed)
[id number] x1 

## 2012-09-06 NOTE — Telephone Encounter (Signed)
Dg Chest 2 View  09/05/2012  *RADIOLOGY REPORT*  Clinical Data: Elevated right hemidiaphragm.  CHEST - 2 VIEW  Comparison: 120 02/2011.  Findings: The cardiac silhouette, mediastinal and hilar contours are stable.  The pacer wires are unchanged.  There is chronic elevation of the right hemidiaphragm.  Minimal basilar scarring changes but no infiltrates, edema or effusions.  IMPRESSION: Chronic lung changes but no acute pulmonary findings.   Original Report Authenticated By: Rudie Meyer, M.D.     Will have my nurse inform pt that CXR shows chronic changes from her bronchomalacia and elevated right diaphragm.  No new findings.  No change to current treatment plan.

## 2012-09-07 NOTE — Telephone Encounter (Signed)
Pt notified of cxr results per Dr Craige Cotta.

## 2012-09-07 NOTE — Telephone Encounter (Signed)
ATC pt no answer, LMOMTCB

## 2012-09-07 NOTE — Telephone Encounter (Signed)
Returning call.

## 2012-09-18 DIAGNOSIS — Z79899 Other long term (current) drug therapy: Secondary | ICD-10-CM | POA: Diagnosis not present

## 2012-10-10 DIAGNOSIS — Z7901 Long term (current) use of anticoagulants: Secondary | ICD-10-CM | POA: Diagnosis not present

## 2012-10-10 DIAGNOSIS — E785 Hyperlipidemia, unspecified: Secondary | ICD-10-CM | POA: Diagnosis not present

## 2012-10-10 DIAGNOSIS — I4891 Unspecified atrial fibrillation: Secondary | ICD-10-CM | POA: Diagnosis not present

## 2012-10-11 ENCOUNTER — Other Ambulatory Visit (HOSPITAL_BASED_OUTPATIENT_CLINIC_OR_DEPARTMENT_OTHER): Payer: Medicare Other | Admitting: Lab

## 2012-10-11 ENCOUNTER — Ambulatory Visit (HOSPITAL_BASED_OUTPATIENT_CLINIC_OR_DEPARTMENT_OTHER): Payer: Medicare Other | Admitting: Hematology & Oncology

## 2012-10-11 VITALS — BP 150/61 | HR 69 | Temp 97.8°F | Resp 18 | Ht 64.0 in | Wt 231.0 lb

## 2012-10-11 DIAGNOSIS — D509 Iron deficiency anemia, unspecified: Secondary | ICD-10-CM

## 2012-10-11 DIAGNOSIS — D4620 Refractory anemia with excess of blasts, unspecified: Secondary | ICD-10-CM

## 2012-10-11 DIAGNOSIS — D649 Anemia, unspecified: Secondary | ICD-10-CM

## 2012-10-11 DIAGNOSIS — N189 Chronic kidney disease, unspecified: Secondary | ICD-10-CM

## 2012-10-11 DIAGNOSIS — D631 Anemia in chronic kidney disease: Secondary | ICD-10-CM

## 2012-10-11 LAB — CBC WITH DIFFERENTIAL (CANCER CENTER ONLY)
BASO#: 0 10*3/uL (ref 0.0–0.2)
BASO%: 0.1 % (ref 0.0–2.0)
EOS%: 1.9 % (ref 0.0–7.0)
HCT: 38.7 % (ref 34.8–46.6)
HGB: 12.1 g/dL (ref 11.6–15.9)
LYMPH#: 1.1 10*3/uL (ref 0.9–3.3)
MCH: 30.6 pg (ref 26.0–34.0)
MCHC: 31.3 g/dL — ABNORMAL LOW (ref 32.0–36.0)
MONO%: 5.5 % (ref 0.0–13.0)
NEUT#: 7.1 10*3/uL — ABNORMAL HIGH (ref 1.5–6.5)
NEUT%: 80.1 % — ABNORMAL HIGH (ref 39.6–80.0)
RDW: 14 % (ref 11.1–15.7)

## 2012-10-11 NOTE — Progress Notes (Signed)
This office note has been dictated.

## 2012-10-12 NOTE — Progress Notes (Signed)
CC:   Lenon Curt. Chilton Si, M.D. Duke Salvia, MD, [facility] [place] [place], [place] [phone]  DIAGNOSES: 1. Anemia of renal insufficiency. 2. Recurrent iron-deficiency anemia. 3. Low-grade refractory anemia.  CURRENT THERAPY: 1. Aranesp 300 mcg subcu as needed for hemoglobin less than 11. 2. IV iron as indicated.  INTERIM HISTORY:  Ms. Motter comes in for followup.  We last saw her back in August.  Since then she has been doing okay.  She has not had to go to the hospital.  She is on chronic oxygen.  She enjoyed the holidays.  She is with her family.  When we last saw her, her iron studies showed a ferritin of 478 with an iron saturation of 21%.  There has been no bleeding.  She has had no change in bowel or bladder habits.  There has been no problem with leg swelling.  PHYSICAL EXAMINATION:  General:  This is an elderly white female in no obvious distress.  Vital signs:  Temperature of 97.8, pulse 69, respiratory rate 18, blood pressure 150/61.  Weight is 231.  Head and neck:  Normocephalic, atraumatic skull.  There are no ocular or oral lesions.  There are no palpable cervical or supraclavicular lymph nodes. Lungs:  Decent breath sounds bilaterally.  She may have some crackles at the bases bilaterally.  Cardiac:  Regular rate and rhythm with a normal S1 and S2.  There are no murmurs, rubs, or bruits.  Abdomen:  Soft with good bowel sounds.  There is no palpable abdominal mass.  There is no fluid wave.  There is no palpable hepatosplenomegaly.  Back:  No tenderness over the spine, ribs, or hips.  Extremities:  No clubbing, cyanosis, or edema.  LABORATORY STUDIES:  White cell count is 8.8, hemoglobin 12.1, hematocrit 38.7, platelet count 165.  MCV is 98.  IMPRESSION:  Ms. Bolanos is a 79 year old white female with multifactorial anemia.  Her hemoglobin has been pretty much stable for over a year now.  As such, I really do not think we have to get her back to see  [place].  I am just not sure what we are really doing to help her out right now.  She is being followed closely at [facility].  We will be more than happy to see her back in the future, if there are any problems with her blood.  I am just glad that we could let Ms. Veracruz "graduate."  This really made her and her daughter happy.    ______________________________ Josph Macho, M.D. PRE/MEDQ  D:  10/11/2012  T:  10/12/2012  Job:  [id number]

## 2012-10-29 ENCOUNTER — Encounter: Payer: Self-pay | Admitting: Internal Medicine

## 2012-10-29 ENCOUNTER — Ambulatory Visit (INDEPENDENT_AMBULATORY_CARE_PROVIDER_SITE_OTHER): Payer: Medicare Other | Admitting: Internal Medicine

## 2012-10-29 VITALS — BP 142/86 | HR 79 | Ht 64.0 in | Wt 229.0 lb

## 2012-10-29 DIAGNOSIS — Z950 Presence of cardiac pacemaker: Secondary | ICD-10-CM

## 2012-10-29 DIAGNOSIS — I495 Sick sinus syndrome: Secondary | ICD-10-CM | POA: Diagnosis not present

## 2012-10-29 DIAGNOSIS — I4891 Unspecified atrial fibrillation: Secondary | ICD-10-CM | POA: Diagnosis not present

## 2012-10-29 LAB — PACEMAKER DEVICE OBSERVATION
AL THRESHOLD: 0.375 V
ATRIAL PACING PM: 59
DEVICE MODEL PM: 7198677
VENTRICULAR PACING PM: 1

## 2012-10-29 NOTE — Assessment & Plan Note (Signed)
The patient's device was interrogated.  The information was reviewed. No changes were made in the programming.    

## 2012-10-29 NOTE — Progress Notes (Signed)
Patient Care Team: Kimber Relic, MD as PCP - General   HPI  Cindy Cervantes is a 79 y.o. female followup for her paroxysmal atrial fibrillation with a rapid ventricular response and sinus node dysfunction for which she required pacemaker implantation in the fall of 2011.  She denies any problems with her heart. Her palpitations are flat. She has no edema  She takes amiodarone. She has tolerated this quite well.   .  She is using chronic oxygen. She saw a pulmonary 12/13   She has been seen for her anemia and is felt to have a low-grade myelodysplastic syndrome  as well as a contribution from renal insufficiency  At the last visit we decreased her amiodarone from 1400-1000 mg a week. There has been no intercurrent atrial fibrillation   Past Medical History  Diagnosis Date  . Paroxysmal a-fib with RVR    Amiodarone  . History of hyperkalemia in setting of spironolactone 09/2010  . Sinus node dysfunction   . Pacemaker     Implanted December 2012  . Obesity   . Allergic rhinitis   . Hypertension   . Senile cataract   . Hyperlipidemia   . Xerostomia   . Dysphagia   . Cough     2nd to reflux  . Obstructive sleep apnea (adult) (pediatric)   . Diverticulosis of colon   . Obesity hypoventilation syndrome   . Colon polyp   . Tracheobronchomalacia   . Diaphragm dysfunction     Right hemidiaphragm  . Hypothyroidism   . Anemia of renal disease 10/21/2011  . Anemia, iron deficiency 10/21/2011  . MDS (myelodysplastic syndrome), low grade 10/21/2011    Past Surgical History  Procedure Date  . Tubal ligation 1964  . Total knee arthroplasty 1999    right knee  . Tummy tuck 2000    pt "almost died"; respiratory distress, became delrious  . Total knee arthroplasty 2003    left knee  . Cataract extraction 2008    Current Outpatient Prescriptions  Medication Sig Dispense Refill  . acetaminophen (TYLENOL) 325 MG tablet Take 650 mg by mouth every 6 (six) hours as needed.       Marland Kitchen  amiodarone (PACERONE) 200 MG tablet Take 100 mg by mouth daily.       . Calcium-Vitamin D (CALTRATE 600 PLUS-VIT D PO) Take 600 mg by mouth 2 (two) times daily.      Marland Kitchen ezetimibe (ZETIA) 10 MG tablet Take 10 mg by mouth daily.      Marland Kitchen levothyroxine (SYNTHROID, LEVOTHROID) 25 MCG tablet Take 50 mcg by mouth daily.       . magnesium oxide (MAG-OX) 400 MG tablet Take 400 mg by mouth daily. 1 tab daily       . niacin (NIASPAN) 500 MG CR tablet Take 1,000 mg by mouth at bedtime.       . NON FORMULARY 1.5 L/min. Oxygen      . omeprazole (PRILOSEC) 20 MG capsule Take 20 mg by mouth daily.        . polyethylene glycol powder (MIRALAX) powder Take 17 g by mouth daily. As directed      . potassium chloride SA (K-DUR,KLOR-CON) 20 MEQ tablet Take 20 mEq by mouth daily.       . sertraline (ZOLOFT) 25 MG tablet Take 25 mg by mouth daily. 1 tab qd       . torsemide (DEMADEX) 20 MG tablet Take 40 mg by mouth daily.       Marland Kitchen  warfarin (COUMADIN) 1 MG tablet Use as directed      . warfarin (COUMADIN) 2.5 MG tablet Take as directed by coumadin clinic      . warfarin (COUMADIN) 4 MG tablet Take 2.5 mg by mouth daily. Take as directed        Allergies  Allergen Reactions  . Iron     By infusion  . Morphine   . Toviaz (Fesoterodine Fumarate)     Review of Systems negative except from HPI and PMH  Physical Exam BP 142/86  Pulse 79  Ht 5\' 4"  (1.626 m)  Wt 229 lb (103.874 kg)  BMI 39.31 kg/m2  SpO2 97% Well developed and well nourished in no acute distress HENT normal E scleral and icterus clear Neck Supple JVP6-7 cm; Clear to ausculation  Regular rate and rhythm, no murmurs gallops or rub Soft with active bowel sounds No clubbing cyanosis none Edema Alert and oriented, grossly normal motor and sensory function Skin Warm and Dry  ECG demonstrates sinus rhythm at 79 and 7 intervals 16/10/41 Nonspecific ST-T changes  Assessment and  Plan

## 2012-10-29 NOTE — Assessment & Plan Note (Signed)
61% atrially paced

## 2012-10-29 NOTE — Assessment & Plan Note (Signed)
Paroxysmal atrial fibrillation on amiodarone. Thyroid status is being followed by her PCP as demonstrated by recent adjustments in her Synthroid. I've asked that the liver function tests be obtained at her next visit which is in March. We do not have record of LFTs in the last couple of years.

## 2012-10-29 NOTE — Patient Instructions (Addendum)
Remote monitoring is used to monitor your Pacemaker of ICD from home. This monitoring reduces the number of office visits required to check your device to one time per year. It allows us to keep an eye on the functioning of your device to ensure it is working properly. You are scheduled for a device check from home on January 28, 2013. You may send your transmission at any time that day. If you have a wireless device, the transmission will be sent automatically. After your physician reviews your transmission, you will receive a postcard with your next transmission date.  Your physician wants you to follow-up in: 1 year with Dr Klein.  You will receive a reminder letter in the mail two months in advance. If you don't receive a letter, please call our office to schedule the follow-up appointment.  

## 2012-11-06 DIAGNOSIS — Z7901 Long term (current) use of anticoagulants: Secondary | ICD-10-CM | POA: Diagnosis not present

## 2012-11-06 DIAGNOSIS — I4891 Unspecified atrial fibrillation: Secondary | ICD-10-CM | POA: Diagnosis not present

## 2012-11-06 DIAGNOSIS — E785 Hyperlipidemia, unspecified: Secondary | ICD-10-CM | POA: Diagnosis not present

## 2012-11-28 ENCOUNTER — Encounter: Payer: Self-pay | Admitting: Internal Medicine

## 2012-12-05 DIAGNOSIS — I4891 Unspecified atrial fibrillation: Secondary | ICD-10-CM | POA: Diagnosis not present

## 2012-12-05 DIAGNOSIS — Z7901 Long term (current) use of anticoagulants: Secondary | ICD-10-CM | POA: Diagnosis not present

## 2012-12-27 ENCOUNTER — Other Ambulatory Visit: Payer: Self-pay | Admitting: Geriatric Medicine

## 2012-12-27 MED ORDER — WARFARIN SODIUM 4 MG PO TABS: ORAL_TABLET | ORAL | Status: DC

## 2013-01-01 DIAGNOSIS — Z79899 Other long term (current) drug therapy: Secondary | ICD-10-CM | POA: Diagnosis not present

## 2013-01-01 DIAGNOSIS — I5032 Chronic diastolic (congestive) heart failure: Secondary | ICD-10-CM | POA: Diagnosis not present

## 2013-01-01 DIAGNOSIS — I4891 Unspecified atrial fibrillation: Secondary | ICD-10-CM | POA: Diagnosis not present

## 2013-01-01 DIAGNOSIS — Z7901 Long term (current) use of anticoagulants: Secondary | ICD-10-CM | POA: Diagnosis not present

## 2013-01-28 ENCOUNTER — Encounter: Payer: Medicare Other | Admitting: *Deleted

## 2013-01-29 ENCOUNTER — Encounter: Payer: Self-pay | Admitting: *Deleted

## 2013-02-04 ENCOUNTER — Ambulatory Visit (INDEPENDENT_AMBULATORY_CARE_PROVIDER_SITE_OTHER): Payer: Medicare Other | Admitting: *Deleted

## 2013-02-04 ENCOUNTER — Encounter: Payer: Self-pay | Admitting: Internal Medicine

## 2013-02-04 ENCOUNTER — Telehealth: Payer: Self-pay | Admitting: Internal Medicine

## 2013-02-04 ENCOUNTER — Other Ambulatory Visit: Payer: Self-pay

## 2013-02-04 DIAGNOSIS — I495 Sick sinus syndrome: Secondary | ICD-10-CM

## 2013-02-04 DIAGNOSIS — Z950 Presence of cardiac pacemaker: Secondary | ICD-10-CM

## 2013-02-04 LAB — REMOTE PACEMAKER DEVICE
AL IMPEDENCE PM: 460 Ohm
AL THRESHOLD: 0.375 V
BAMS-0003: 60 {beats}/min
DEVICE MODEL PM: 7198677
RV LEAD AMPLITUDE: 12 mv
RV LEAD THRESHOLD: 0.875 V

## 2013-02-04 NOTE — Telephone Encounter (Signed)
LMOM for pt to return call/kwm  

## 2013-02-04 NOTE — Telephone Encounter (Signed)
Spoke w/pt and pt was instructed to send transmission by pressing white button x 2.

## 2013-02-04 NOTE — Telephone Encounter (Signed)
New problem   Pt stated her transmission is done automatically.

## 2013-02-06 ENCOUNTER — Other Ambulatory Visit: Payer: Self-pay | Admitting: Internal Medicine

## 2013-02-06 ENCOUNTER — Encounter: Payer: Self-pay | Admitting: Geriatric Medicine

## 2013-02-06 ENCOUNTER — Non-Acute Institutional Stay: Payer: Medicare Other | Admitting: Geriatric Medicine

## 2013-02-06 VITALS — BP 150/68 | HR 68 | Ht 64.0 in | Wt 230.0 lb

## 2013-02-06 DIAGNOSIS — E039 Hypothyroidism, unspecified: Secondary | ICD-10-CM

## 2013-02-06 DIAGNOSIS — Z7901 Long term (current) use of anticoagulants: Secondary | ICD-10-CM

## 2013-02-06 DIAGNOSIS — F329 Major depressive disorder, single episode, unspecified: Secondary | ICD-10-CM

## 2013-02-06 DIAGNOSIS — I4891 Unspecified atrial fibrillation: Secondary | ICD-10-CM

## 2013-02-06 DIAGNOSIS — E785 Hyperlipidemia, unspecified: Secondary | ICD-10-CM

## 2013-02-06 DIAGNOSIS — I5032 Chronic diastolic (congestive) heart failure: Secondary | ICD-10-CM

## 2013-02-06 DIAGNOSIS — E669 Obesity, unspecified: Secondary | ICD-10-CM

## 2013-02-06 DIAGNOSIS — M8580 Other specified disorders of bone density and structure, unspecified site: Secondary | ICD-10-CM

## 2013-02-06 DIAGNOSIS — Z0000 Encounter for general adult medical examination without abnormal findings: Secondary | ICD-10-CM

## 2013-02-06 DIAGNOSIS — Z1231 Encounter for screening mammogram for malignant neoplasm of breast: Secondary | ICD-10-CM

## 2013-02-06 DIAGNOSIS — K5900 Constipation, unspecified: Secondary | ICD-10-CM

## 2013-02-06 DIAGNOSIS — K219 Gastro-esophageal reflux disease without esophagitis: Secondary | ICD-10-CM

## 2013-02-06 NOTE — Progress Notes (Signed)
Patient ID: Cindy Cervantes, female   DOB: 1932/01/13, 79 y.o.   MRN: [id number]   Annual Comprehensive Exam  Chief Complaint  Patient presents with  . Annual Exam  . Medical Managment of Chronic Issues    A-Fib, cholesterol, COPD, thyroid, anticoagulation check     HPI: This 79 year old female resident of [facility], Assisted Living section, returns to clinic this afternoon for Annual medical update and physical exam. This patient has not had a hospitalization, serious illness or injury in the last year. She reports feeling very well in general, is content in her assisted-living apartment, participates in activities of choice, continues to work on craft projects on her own. She maintains quality family relationships. No specific complaints today. This patient has multiple chronic medical issues, all appear well-controlled except obesity. Her weight has slowly crept up over the last 2 years from 204 to 230 pounds. Patient freely acknowledges that she does not exercise as she is able, also makes some poor food choices.  Allergies  Allergies  Allergen Reactions  . Iron     By infusion  . Morphine   . Toviaz (Fesoterodine Fumarate)    Medications  Current outpatient prescriptions:acetaminophen (TYLENOL) 325 MG tablet, Take 650 mg by mouth every 4 (four) hours as needed. , Disp: , Rfl: ;  amiodarone (PACERONE) 200 MG tablet, Take 100 mg by mouth daily. , Disp: , Rfl: ;  Calcium-Vitamin D (CALTRATE 600 PLUS-VIT D PO), Take 600 mg by mouth 2 (two) times daily., Disp: , Rfl: ;  ezetimibe (ZETIA) 10 MG tablet, Take 10 mg by mouth daily., Disp: , Rfl:  levothyroxine (SYNTHROID, LEVOTHROID) 25 MCG tablet, Take 50 mcg by mouth daily. , Disp: , Rfl: ;  magnesium oxide (MAG-OX) 400 MG tablet, Take 400 mg by mouth daily. 1 tab daily , Disp: , Rfl: ;  niacin (NIASPAN) 500 MG CR tablet, Take 1,000 mg by mouth at bedtime. , Disp: , Rfl: ;  NON FORMULARY, 1.5 L/min. Oxygen, Disp: , Rfl: ;   omeprazole (PRILOSEC) 20 MG capsule, Take 20 mg by mouth daily.  , Disp: , Rfl:  polyethylene glycol powder (MIRALAX) powder, Take 17 g by mouth daily. As directed, Disp: , Rfl: ;  potassium chloride SA (K-DUR,KLOR-CON) 20 MEQ tablet, Take 20 mEq by mouth daily. , Disp: , Rfl: ;  sertraline (ZOLOFT) 25 MG tablet, Take 1 tablet (25 mg total) by mouth daily. Take one tablet every other day for 2 weeks then stop, Disp: , Rfl: ;  torsemide (DEMADEX) 20 MG tablet, Take 40 mg by mouth daily. , Disp: , Rfl:  warfarin (COUMADIN) 1 MG tablet, Use as directed, Disp: , Rfl: ;  warfarin (COUMADIN) 2.5 MG tablet, Take as directed by coumadin clinic, Disp: , Rfl: ;  warfarin (COUMADIN) 4 MG tablet, Take once a day on Wed, Fri; 4:00 pm - 6:00 pm., Disp: 30 tablet, Rfl: 3  Data Reviewed     WUJ:WJXBJYN, external 08/07/12 WBC 7.5, Hgb 11.7, Hct 35.1, Plt 167 Glu 97, BUN 25, Cr. 1.37, Na 143, K+ 4.2 TC 226, TG 79, HDL 62, LDL 143 INR 2.1 09/05/12 INR 2.3 10/10/12: INR 2.5 11/06/2012: INR 2.20 12/06/12 INR 2.2  total cholesterol 198, triglyceride 88, HDL 57, LDL 147. 01/01/2013 INR 2.08  Sodium 143, Potassium 3.7, C02 38, glucose 85, BUN 24, Creatinine 1.66, T Protein 5.9  TSH 3.365 02/06/13 INR 2.9  PMH  Past Medical History  Diagnosis Date  . Paroxysmal a-fib with RVR  Amiodarone  . History of hyperkalemia in setting of spironolactone 09/2010  . Sinus node dysfunction   . Pacemaker     Implanted December 2012  . Obesity   . Allergic rhinitis   . Hypertension   . Senile cataract   . Hyperlipidemia   . Xerostomia   . Dysphagia   . Cough     2nd to reflux  . Obstructive sleep apnea (adult) (pediatric)   . Diverticulosis of colon   . Obesity hypoventilation syndrome   . Colon polyp   . Tracheobronchomalacia   . Diaphragm dysfunction     Right hemidiaphragm  . Hypothyroidism   . Anemia of renal disease 10/21/2011  . Anemia, iron deficiency 10/21/2011  . MDS (myelodysplastic syndrome), low grade  10/21/2011  . Unspecified vitamin D deficiency   . Dehydration   . Hypopotassemia   . Other specified disease of white blood cells   . Chronic diastolic heart failure   . COPD (chronic obstructive pulmonary disease)   . Reflux esophagitis   . Cholelithiasis   . Osteoarthrosis, unspecified whether generalized or localized, unspecified site   . Disorder of bone and cartilage, unspecified   . Shortness of breath   . Other dyspnea and respiratory abnormality   . Unspecified urinary incontinence   . Other nonspecific abnormal serum enzyme levels   . Nonspecific abnormal results of liver function study   . Debility, unspecified   . Knee joint replacement by other means   . Long term (current) use of anticoagulants   . Unspecified hypothyroidism 02/08/2013  . GERD (gastroesophageal reflux disease) 02/08/2013  . Depressive disorder, not elsewhere classified   . Unspecified constipation    Procedures: Echocardiogram Cardiac cath Bronchoscopy Chest X-Ray  Bone density - no osteoporosis Colonoscopy- last one >5 years ago 2009 - mammogram Normal  12/16/2009 Colonoscopy: Incomplete exam Diverticulosis, mild, left sided diverticulosis exam to transverse colon (100 cm) redundant colon. Dr Lina Sar  03/09/2010-CT Chest: No findings to explain the patient's given symptoms. Left hepatic lobe atrophy with areas of markedly decreased attenuation in the residual liver. While this may represent a non typical fat deposition, further evaluation with MR abdomen with and without contrast is recommended as malignancy cannot be excluded. Cholelithiasis present. 06/10/10: EKG: Sinus tach, rate 114 BPM. Atrial premature complex, incomplete RBBB 07/05/2010-MRI Abdomen: Left hepatic lobe atrophy. The questioned areas on CT within the remaining right lobe are consistent with areas of focal fat deposition. No worrisome liver liver findings. Cholelithiasis without cholecystitis. minimally complex left renal cysts. Tiny  cystic focus within the pancreatic tail Most likely a pseudocyst. 08/24/10: Esophagram/ BaSwallow:  Disruption of primary peristalsis an all swallows. Significant gastroesophageal reflux of upper thoracic esophagus with prominent tertiary wave contractions. 09/17/10-Chest X-Ray: Chronic elevation of the right hemidiaphragm with right basilar plate like atelectasis. No acute cardiopulmonary disease 09/21/2010-Chest X-Ray: Question of mild pulmonary vascular congestion. Chronic elevation of the right hemidiaphragm.  09/27/2010-Chest X-Ray: Cardiomegaly again noted. Stable chronic elevation of the right hemidiaphragm with right basilar atelectasis. No active disease. 10/11/10 CXR:  10/25/10 CXR: 10/27/10 CXR: Stable cardiac enlargement/vasc. congestion w/o overt pulmonary edema. Persistent elevation of Rt. hemidiaphragm w/ overlapping vascular crowding/ atx.  CT angio chest: No evidence of PE. ound glass opacities LUL suggesting infection. Tracheobronchial malacia w/ areas of air trapping throughout both luings. Left chest wal PPM inplace.  10/28/10: Chest fluoroscopy: Moderately elevated rt. hemidiaphragm redemonstrated but excursion is relatively symmetric,( only 8% difference ). Query weakening or prior injury rather  than rt.phrenic nerve dysfunction 06/08/2011 Bone Density osteopenia 06/21/2011 US Breast negative 06/21/2011 Mammogram benign  PSH  Past Surgical History  Procedure Laterality Date  . Tubal ligation  1964  . Total knee arthroplasty  1999    right knee  . Tummy tuck  2000    pt "almost died"; respiratory distress, became delrious  . Total knee arthroplasty  2003    left knee  . Cataract extraction  2008  . Pacemaker placement  10/21/2010    dural chamber Dr. Graciela Husbands   FH  indicated that her mother is deceased. She indicated that her father is deceased. She indicated that both of her sisters are alive. She indicated that her brother is deceased. She indicated that her daughter is alive.  She indicated that all of her three sons are alive.   family history includes Heart disease in her brother and father.   SH  History   Social History Narrative   Widowed 2002 (married 1955), originally form [place]. 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 Moved from [place], [place] to [place] [facility], [place] section 2009. Moved to Assisted Living section 2012.    Non smoker, mininmal alcohol.    Has a Living Will            Review of Systems   DATA OBTAINED: from patient. GENERAL: Feels well. No fevers, fatigue, change in activity status. No change in appetite, weight SKIN: No itch, rash or open wounds EYES: No eye pain, dryness or itching. No change in vision EARS: No earache, tinnitus, change in hearing NOSE: No congestion, drainage or bleeding MOUTH/THROAT: No mouth or tooth pain. No difficulty chewing or swallowing. RESPIRATORY: Occ, cough., mild DOE No wheezing, continues with supplemental oxygen, is followed by Dr. Craige Cotta CARDIAC: No chest pain, palpitations. No edema. CHEST/BREASTS: No discomfort, discharge or lumps in breasts GI: No abdominal pain. No N/V/D or constipation. Occ. mild reflux.  GU: No dysuria, frequency or urgency.  No change in urine volume or character MUSCULOSKELETAL: No joint pain, swelling or stiffness. No back pain. No muscle ache, pain, weakness. Gait is steady. No recent falls.  NEUROLOGIC: No dizziness, fainting, headache. No change in mental status.  PSYCHIATRIC: No anxiety, depression. Sleeps well.   Physical Exam Filed Vitals:   02/06/13 0949  BP: 150/68  Pulse: 68  Height: 5\' 4"  (1.626 m)  Weight: 230 lb (104.327 kg)  SpO2: 92%   Body mass index is 39.46 kg/(m^2).  GENERAL APPEARANCE: No acute distress, appropriately groomed, Obese body habitus. Alert, pleasant, conversant. HEAD: Normocephalic, atraumatic EYES: Conjunctiva/lids clear. Pupils round, reactive. EOMs intact.  EARS: External exam  WNL, canals clear, TM WNL. Hearing grossly normal. NOSE: No deformity or discharge. MOUTH/THROAT: Lips w/o lesions. Oral mucosa, tongue moist, w/o lesion. Oropharynx w/o redness or lesions.  NECK: Supple, full ROM. No thyroid tenderness, enlargement or nodule LYMPHATICS: No head, neck or supraclavicular adenopathy RESPIRATORY: Breathing is even, unlabored. Decreased lung sounds at left base (not new), Rt. lung sounds are clear. O2 Sat 95% at rest CARDIOVASCULAR: Heart RRR. No murmur or extra heart sounds  ARTERIAL: No carotid bruit. 2+ pulse at bilateral carotid,  DP. No palpable Femoral or popliteal pulse.  VENOUS: No varicosities. No venous stasis skin changes  EDEMA: No peripheral or periorbital edema.   GASTROINTESTINAL: Abdomen is Obese, soft,, not distended w/ normal bowel sounds. Mild tenderness LLQNo hepatic or splenic enlargement. No mass, ventral or inguinal hernia.  RECTAL: No anal fissure, skin tag or  external hemorrhoid. Sphincter tone WNL. Stool is brown, heme negative. GENITOURINARY: Bladder non tender, not distended. MUSCULOSKELETAL: Moves all extremities with full ROM, strength and tone. Back is without kyphosis, scoliosis or spinal process tenderness. Gait is steady NEUROLOGIC: Oriented to time, place, person. Cranial nerves 2-12 grossly intact, speech clear, no tremor.  PSYCHIATRIC: Mood and affect appropriate to situation  ASSESSMENT/PLAN  Unspecified hypothyroidism Most recent TSH within desired range. Patient is asymptomatic continue present thyroid dose, recheck at intervals.  Atrial fibrillation Patient remains in regular rhythm. Continues low-dose amiodarone, recent LFTs and TSH satisfactory. Patient is scheduled to return to Dr. Alberteen Spindle July 2014.  GERD (gastroesophageal reflux disease) Omeprazole was restarted at some point this last year. Patient reports reflux symptoms are generally well controlled she does occasionally have a mild symptoms in the morning. Continue  omeprazole 20 mg daily.  Depressive disorder, not elsewhere classified Patient is not displaying any depressive symptoms over the last year. Discussed medication and she would like to taper and discontinue the sertraline.  Unspecified constipation Patient has been taking MiraLax every other day with regular bowel movements. Continue this dosing pattern  Chronic diastolic heart failure Patient continues to complain of mild dyspnea on exertion, has a mild chronic cough. Weight has not improved with increased dose of diuretic. She does not have any peripheral edema. Will not adjust diuretic any further at this time. I'm not convinced her continued weight gain is re: worseningheart failure.  Obesity Obesity is a long-standing problem for this patient. Her weight has crept up to 230 pounds in the last year. She tells me "I don't eat much..I also don't exercise very much... " Extensive discussion and counseling today regarding dietary changes and moderate exercise.  Long term current use of anticoagulant INR remains in therapeutic range, continue current warfarin dose, recheck in 4 weeks.  Health care maintenance Last colonoscopy 2011 findings included diverticulosis, mild left-sided diverticulosis exam to transverse colon, redundant colon. Colonoscopy not indicated at this time. Stool  is guaiac negative  Last mammogram 06/2011, recommended  repeat mammogram this year. Last bone density scan 06/2011 noted osteopenia. Recommend repeat bone density scan this year  60 minutes spent with patient.  Follow up: 4 weeks INR  Cindy Blake T.Miaa Latterell, NP-C 02/06/2013

## 2013-02-08 ENCOUNTER — Encounter: Payer: Self-pay | Admitting: Geriatric Medicine

## 2013-02-08 DIAGNOSIS — Z0000 Encounter for general adult medical examination without abnormal findings: Secondary | ICD-10-CM | POA: Insufficient documentation

## 2013-02-08 DIAGNOSIS — K5900 Constipation, unspecified: Secondary | ICD-10-CM | POA: Insufficient documentation

## 2013-02-08 DIAGNOSIS — F339 Major depressive disorder, recurrent, unspecified: Secondary | ICD-10-CM | POA: Insufficient documentation

## 2013-02-08 DIAGNOSIS — K219 Gastro-esophageal reflux disease without esophagitis: Secondary | ICD-10-CM

## 2013-02-08 DIAGNOSIS — E039 Hypothyroidism, unspecified: Secondary | ICD-10-CM

## 2013-02-08 HISTORY — DX: Hypothyroidism, unspecified: E03.9

## 2013-02-08 HISTORY — DX: Gastro-esophageal reflux disease without esophagitis: K21.9

## 2013-02-08 MED ORDER — SERTRALINE HCL 25 MG PO TABS: 25.0000 mg | ORAL_TABLET | Freq: Every day | ORAL | Status: DC

## 2013-02-08 NOTE — Assessment & Plan Note (Signed)
Patient continues to complain of mild dyspnea on exertion, has a mild chronic cough. Weight has not improved with increased dose of diuretic. She does not have any peripheral edema. Will not adjust diuretic any further at this time. I'm not convinced her continued weight gain is re: worseningheart failure.

## 2013-02-08 NOTE — Assessment & Plan Note (Signed)
Patient remains in regular rhythm. Continues low-dose amiodarone, recent LFTs and TSH satisfactory. Patient is scheduled to return to Dr. Alberteen Spindle July 2014.

## 2013-02-08 NOTE — Assessment & Plan Note (Signed)
Patient is not displaying any depressive symptoms over the last year. Discussed medication and she would like to taper and discontinue the sertraline.

## 2013-02-08 NOTE — Assessment & Plan Note (Signed)
Obesity is a long-standing problem for this patient. Her weight has crept up to 230 pounds in the last year. She tells me "I don't eat much..I also don't exercise very much... " Extensive discussion and counseling today regarding dietary changes and moderate exercise.

## 2013-02-08 NOTE — Assessment & Plan Note (Signed)
Patient has been taking MiraLax every other day with regular bowel movements. Continue this dosing pattern

## 2013-02-08 NOTE — Assessment & Plan Note (Signed)
Most recent TSH within desired range. Patient is asymptomatic continue present thyroid dose, recheck at intervals.

## 2013-02-08 NOTE — Assessment & Plan Note (Signed)
Omeprazole was restarted at some point this last year. Patient reports reflux symptoms are generally well controlled she does occasionally have a mild symptoms in the morning. Continue omeprazole 20 mg daily.

## 2013-02-08 NOTE — Assessment & Plan Note (Signed)
Last colonoscopy 2011 findings included diverticulosis, mild left-sided diverticulosis exam to transverse colon, redundant colon. Colonoscopy not indicated at this time. Stool  is guaiac negative  Last mammogram 06/2011, recommended  repeat mammogram this year. Last bone density scan 06/2011 noted osteopenia. Recommend repeat bone density scan this year

## 2013-02-08 NOTE — Assessment & Plan Note (Signed)
INR remains in therapeutic range, continue current warfarin dose, recheck in 4 weeks.

## 2013-02-18 DIAGNOSIS — H04129 Dry eye syndrome of unspecified lacrimal gland: Secondary | ICD-10-CM | POA: Diagnosis not present

## 2013-02-28 ENCOUNTER — Encounter: Payer: Self-pay | Admitting: *Deleted

## 2013-03-05 ENCOUNTER — Ambulatory Visit (INDEPENDENT_AMBULATORY_CARE_PROVIDER_SITE_OTHER): Payer: Medicare Other | Admitting: Pulmonary Disease

## 2013-03-05 ENCOUNTER — Encounter: Payer: Self-pay | Admitting: Pulmonary Disease

## 2013-03-05 VITALS — BP 114/62 | HR 74 | Temp 97.6°F | Ht 64.0 in | Wt 232.8 lb

## 2013-03-05 DIAGNOSIS — G4736 Sleep related hypoventilation in conditions classified elsewhere: Secondary | ICD-10-CM

## 2013-03-05 DIAGNOSIS — J398 Other specified diseases of upper respiratory tract: Secondary | ICD-10-CM

## 2013-03-05 DIAGNOSIS — R05 Cough: Secondary | ICD-10-CM | POA: Diagnosis not present

## 2013-03-05 DIAGNOSIS — G4733 Obstructive sleep apnea (adult) (pediatric): Secondary | ICD-10-CM

## 2013-03-05 DIAGNOSIS — R0602 Shortness of breath: Secondary | ICD-10-CM

## 2013-03-05 DIAGNOSIS — R059 Cough, unspecified: Secondary | ICD-10-CM

## 2013-03-05 DIAGNOSIS — J988 Other specified respiratory disorders: Secondary | ICD-10-CM | POA: Diagnosis not present

## 2013-03-05 MED ORDER — TRIAMCINOLONE ACETONIDE(NASAL) 55 MCG/ACT NA INHA: 2.0000 | Freq: Every day | NASAL | Status: DC

## 2013-03-05 NOTE — Assessment & Plan Note (Signed)
Will try decreasing her BiPAP to 14/10 and get download from her machine.

## 2013-03-05 NOTE — Assessment & Plan Note (Signed)
Likely related to allergies and post-nasal drip.  She is to try nasal irrigation and OTC nasacort.

## 2013-03-05 NOTE — Patient Instructions (Signed)
Will change BiPAP setting to 14/10 cm H2O >> will get report from machine and call with results Try using saline nasal spray Try using nasacort 1 to 2 sprays each nostril daily for 1 week, then as needed >> you can get this without a prescription from your pharmacy Follow up in 6 months

## 2013-03-05 NOTE — Assessment & Plan Note (Signed)
She is to continue 1.5 to 2 liters oxygen 24/7.  I have completed form for her to get Inogen POC.

## 2013-03-05 NOTE — Progress Notes (Signed)
Chief Complaint  Patient presents with  . Sleep Apnea    Pt states she still wears her BPAP everynight x 8 hrs night w/ 2 liters O2. Pt denies any problems w. mask/machine. c/o dry mouth often.   . Shortness of Breath    Breathing has been. Has morning cough w/ yellow phlem. She uses 1.5-2 liters O2 during the day. She gets SOB w/ exertion    History of Present Illness: Cindy Cervantes is a 79 y.o. female former smoker with cough 2nd to GERD, OSA/OHS, tracheobronchomalacia, and Rt hemidiaphragm elevation.  She has noticed more sinus congestion.  This is causing post-nasal drip, throat irritation, and dry cough.  She denies wheeze, sputum, or chest pain.  She has not had fever.  She uses her BiPAP every night.  She uses oxygen 24/7 at 1.5 to 2 liters.  She wants to get an inogen POC.  Tests: PFT's March 29 2010 >> FEV1 1.34 (72) with ratio 75 and ERV 42, DLC0 55 > corrects to 137 CT chest 10/17/10>>GGO LUL, no PE, tracheobronchiomalacia involving the visualized trachea with areas of air trapping seen throughout both lungs PSG 12/14/10>>AHI 17.9, REM 40.8 BPAP titraton 01/12/11>>18/13 cm H2O with 3 liters oxygen. BPAP 02/01/11 to 03/04/12>>Used on 386 of 398 nights with average 7 hrs 33 min.  Average AHI 2.7 with BPAP 16/13 cm H2O.  Minimal airleak.  Tv 460, Ve 6.1, RR 15. 03/12/12 Change to BiPAP 15/12 cm H2O 03/05/13 Change BiPAP to 14/10 cm H2O  She  has a past medical history of Paroxysmal a-fib (with RVR); History of hyperkalemia (in setting of spironolactone 09/2010); Sinus node dysfunction; Pacemaker; Obesity; Allergic rhinitis; Hypertension; Senile cataract; Hyperlipidemia; Xerostomia; Dysphagia; Cough; Obstructive sleep apnea (adult) (pediatric); Diverticulosis of colon; Obesity hypoventilation syndrome; Colon polyp; Tracheobronchomalacia; Diaphragm dysfunction; Hypothyroidism; Anemia of renal disease (10/21/2011); Anemia, iron deficiency (10/21/2011); MDS (myelodysplastic syndrome), low  grade (10/21/2011); Unspecified vitamin D deficiency; Dehydration; Hypopotassemia; Other specified disease of white blood cells; Chronic diastolic heart failure; COPD (chronic obstructive pulmonary disease); Reflux esophagitis; Cholelithiasis; Osteoarthrosis, unspecified whether generalized or localized, unspecified site; Disorder of bone and cartilage, unspecified; Shortness of breath; Other dyspnea and respiratory abnormality; Unspecified urinary incontinence; Other nonspecific abnormal serum enzyme levels; Nonspecific abnormal results of liver function study; Debility, unspecified; Knee joint replacement by other means; Long term (current) use of anticoagulants; Unspecified hypothyroidism (02/08/2013); GERD (gastroesophageal reflux disease) (02/08/2013); Depressive disorder, not elsewhere classified; and Unspecified constipation.  She  has past surgical history that includes Tubal ligation (1964); Total knee arthroplasty (1999); Tummy tuck (2000); Total knee arthroplasty (2003); Cataract extraction (2008); and pacemaker placement (10/21/2010).  Current Outpatient Prescriptions on File Prior to Visit  Medication Sig Dispense Refill  . acetaminophen (TYLENOL) 325 MG tablet Take 650 mg by mouth every 4 (four) hours as needed.       Marland Kitchen amiodarone (PACERONE) 200 MG tablet Take 100 mg by mouth daily.       . Calcium-Vitamin D (CALTRATE 600 PLUS-VIT D PO) Take 600 mg by mouth 2 (two) times daily.      Marland Kitchen ezetimibe (ZETIA) 10 MG tablet Take 10 mg by mouth daily.      Marland Kitchen levothyroxine (SYNTHROID, LEVOTHROID) 25 MCG tablet Take 50 mcg by mouth daily.       . magnesium oxide (MAG-OX) 400 MG tablet Take 400 mg by mouth daily. 1 tab daily       . niacin (NIASPAN) 500 MG CR tablet Take 1,000 mg by mouth at bedtime.       Marland Kitchen  NON FORMULARY 1.5-2 L/min. Oxygen      . omeprazole (PRILOSEC) 20 MG capsule Take 20 mg by mouth daily.        . polyethylene glycol powder (MIRALAX) powder Take 17 g by mouth daily. As directed       . potassium chloride SA (K-DUR,KLOR-CON) 20 MEQ tablet Take 20 mEq by mouth daily.       Marland Kitchen torsemide (DEMADEX) 20 MG tablet Take 40 mg by mouth daily.       Marland Kitchen warfarin (COUMADIN) 1 MG tablet Use as directed      . warfarin (COUMADIN) 2.5 MG tablet Take as directed by coumadin clinic      . warfarin (COUMADIN) 4 MG tablet Take once a day on Wed, Fri; 4:00 pm - 6:00 pm.  30 tablet  3   No current facility-administered medications on file prior to visit.    Allergies  Allergen Reactions  . Codeine     Extreme lethargy  . Iron     By infusion  . Morphine   . Sulfa Antibiotics   . Toviaz (Fesoterodine Fumarate)     Physical Exam:  General - No distress, wearing oxygen ENT - No sinus tenderness, clear nasal drainage, no oral exudate, no LAN Cardiac - s1s2 regular, no murmur Chest - decreased breath sounds Rt base, no wheeze/rales Back - No focal tenderness Abd - Soft, non-tender Ext - No edema Neuro - Normal strength Skin - No rashes Psych - Normal mood, and behavior   Assessment/Plan:  Coralyn Helling, MD [facility] Pager:  [phone] 03/05/2013, 3:56 PM

## 2013-03-05 NOTE — Assessment & Plan Note (Signed)
Multifactorial. She has diastolic dysfuntion, obesity with deconditioning and hypoventilation, chronic right hemidiaphragm elevation, and tracheobronchomalacia on CT chest.  Stable on current regimen.

## 2013-03-05 NOTE — Assessment & Plan Note (Signed)
Stable on supplemental oxygen and nocturnal BiPAP.

## 2013-03-06 ENCOUNTER — Encounter: Payer: Self-pay | Admitting: Geriatric Medicine

## 2013-03-06 ENCOUNTER — Non-Acute Institutional Stay: Payer: Medicare Other | Admitting: Geriatric Medicine

## 2013-03-06 VITALS — BP 144/68 | HR 68 | Ht 64.0 in | Wt 231.0 lb

## 2013-03-06 DIAGNOSIS — I4891 Unspecified atrial fibrillation: Secondary | ICD-10-CM | POA: Diagnosis not present

## 2013-03-06 DIAGNOSIS — Z7901 Long term (current) use of anticoagulants: Secondary | ICD-10-CM

## 2013-03-06 NOTE — Assessment & Plan Note (Signed)
Heart remains in regular rhythm, rate controlled continue current medications, return to Dr. Graciela Husbands 04/02/2013

## 2013-03-06 NOTE — Assessment & Plan Note (Signed)
INR remained subtherapeutic range, continue current warfarin dose, recheck INR in 5 weeks

## 2013-03-06 NOTE — Progress Notes (Signed)
Patient ID: Cindy Cervantes, female   DOB: 10-17-1931, 79 y.o.   MRN: [id number] [facility] [id number])  Chief Complaint  Patient presents with  . Medical Managment of Chronic Issues    coag check    HPI: This is a 79 y.o. female resident of [facility],section evaluated today for coagulation management. Patient has been taking her warfarin as directed, no new medications or diet changes. Patient returned to Dr.Sood yesterday for routine followup of her respiratory status. He made adjustments to her BiPAP machine. Also made recommendations for treatment of allergic cough and postnasal drip. She's to return to see him in 6 months. Will be taking a trip to [place] in July with family - travelling in RV!  Allergies  Allergies  Allergen Reactions  . Codeine     Extreme lethargy  . Iron     By infusion  . Morphine   . Sulfa Antibiotics   . Toviaz (Fesoterodine Fumarate)    Medications Reviewed  Data Reviewed    JWJ:[id number], external  08/07/12 WBC 7.5, Hgb 11.7, Hct 35.1, Plt 167   Glu 97, BUN 25, Cr. 1.37, Na 143, K+ 4.2   TC 226, TG 79, HDL 62, LDL 143     11/06/2012: INR 2.20  12/06/12 INR 2.2   total cholesterol 198, triglyceride 88, HDL 57, LDL 147.  01/01/2013 INR 2.08   Sodium 143, Potassium 3.7, C02 38, glucose 85,  BUN 24, Creatinine 1.66, T Protein 5.9   TSH 3.365    POCT 12/06/12 INR 2.2  02/06/13 INR 2.9 03/06/13 INR 2.2  Review of Systems  DATA OBTAINED: from patient, GENERAL: Feels well  No fevers, fatigue, change in activity status, appetite, or weight  RESPIRATORY: Morning cough, No wheezing, SOB. New, compact O2 concentrator CARDIAC: No chest pain, palpitations. No edema GI: No abdominal pain  No N/V/D or constipation  No heartburn or reflux  MUSCULOSKELETAL: No joint pain, swelling or stiffness No back pain No muscle ache, pain, weakness Gait is steady No recent falls  NEUROLOGIC: No dizziness, fainting,  headache No change in mental status  PSYCHIATRIC: No feelings of anxiety, depression  Sleeps well    Physical Exam Filed Vitals:   03/06/13 0937  BP: 144/68  Pulse: 68  Height: 5\' 4"  (1.626 m)  Weight: 231 lb (104.781 kg)  SpO2: 99%   Body mass index is 39.63 kg/(m^2).  GENERAL APPEARANCE: No acute distress, appropriately groomed, Obese body habitus Alert, pleasant, conversant. HEAD: Normocephalic, atraumatic EYES: Conjunctiva/lids clear  RESPIRATORY: Breathing is even, unlabored Lung sounds are decreased on rt (not new), clear and full om left CARDIOVASCULAR: Heart RRR   No murmur or extra heart sounds   EDEMA: No peripheral edema   MUSCULOSKELETAL.  Gait is steady PSYCHIATRIC: Mood and affect appropriate to situation   ASSESSMENT/PLAN  Long term current use of anticoagulant INR remained subtherapeutic range, continue current warfarin dose, recheck INR in 5 weeks  Atrial fibrillation Heart remains in regular rhythm, rate controlled continue current medications, return to Dr. Graciela Husbands 04/02/2013    Follow up: 5 weeks , INR  Cindy Cervantes T.Cindy Calarco, NP-C 03/06/2013

## 2013-04-02 ENCOUNTER — Encounter: Payer: Self-pay | Admitting: Internal Medicine

## 2013-04-02 ENCOUNTER — Ambulatory Visit (INDEPENDENT_AMBULATORY_CARE_PROVIDER_SITE_OTHER): Payer: Medicare Other | Admitting: Internal Medicine

## 2013-04-02 VITALS — BP 128/70 | HR 75 | Ht 64.0 in | Wt 230.0 lb

## 2013-04-02 DIAGNOSIS — I4891 Unspecified atrial fibrillation: Secondary | ICD-10-CM

## 2013-04-02 DIAGNOSIS — Z950 Presence of cardiac pacemaker: Secondary | ICD-10-CM

## 2013-04-02 DIAGNOSIS — I495 Sick sinus syndrome: Secondary | ICD-10-CM | POA: Diagnosis not present

## 2013-04-02 DIAGNOSIS — I5032 Chronic diastolic (congestive) heart failure: Secondary | ICD-10-CM

## 2013-04-02 LAB — PACEMAKER DEVICE OBSERVATION
AL AMPLITUDE: 0.8 mv
AL IMPEDENCE PM: 412.5 Ohm
AL THRESHOLD: 0.5 V
BAMS-0003: 60 {beats}/min
BATTERY VOLTAGE: 2.9478 V
DEVICE MODEL PM: 7198677
RV LEAD AMPLITUDE: 12 mv

## 2013-04-02 NOTE — Progress Notes (Signed)
Patient Care Team: Kimber Relic, MD as PCP - General Toribio Harbour, NP as Nurse Practitioner (Geriatric Medicine)   HPI  Cindy Cervantes is a 79 y.o. female followup for her paroxysmal atrial fibrillation with a rapid ventricular response and sinus node dysfunction for which she required pacemaker implantation in the fall of 2011.     She takes amiodarone. She has tolerated this quite well.   .  She is using chronic oxygen. She saw a pulmonary recently and now has a hand held oxygen concentrator which she loves   She has been seen for her anemia and is felt to have a low-grade myelodysplastic syndrome  as well as a contribution from renal insufficiency   At the last visit we decreased her amiodarone from  1000-700  mg a week. There has been no intercurrent atrial fibrillation   Past Medical History  Diagnosis Date  . Paroxysmal a-fib with RVR    Amiodarone  . History of hyperkalemia in setting of spironolactone 09/2010  . Sinus node dysfunction   . Pacemaker     Implanted December 2012  . Obesity   . Allergic rhinitis   . Hypertension   . Senile cataract   . Hyperlipidemia   . Xerostomia   . Dysphagia   . Cough     2nd to reflux  . Obstructive sleep apnea (adult) (pediatric)   . Diverticulosis of colon   . Obesity hypoventilation syndrome   . Colon polyp   . Tracheobronchomalacia   . Diaphragm dysfunction     Right hemidiaphragm  . Hypothyroidism   . Anemia of renal disease 10/21/2011  . Anemia, iron deficiency 10/21/2011  . MDS (myelodysplastic syndrome), low grade 10/21/2011  . Unspecified vitamin D deficiency   . Dehydration   . Hypopotassemia   . Other specified disease of white blood cells   . Chronic diastolic heart failure   . COPD (chronic obstructive pulmonary disease)   . Reflux esophagitis   . Cholelithiasis   . Osteoarthrosis, unspecified whether generalized or localized, unspecified site   . Disorder of bone and cartilage, unspecified   .  Shortness of breath   . Other dyspnea and respiratory abnormality   . Unspecified urinary incontinence   . Other nonspecific abnormal serum enzyme levels   . Nonspecific abnormal results of liver function study   . Debility, unspecified   . Knee joint replacement by other means   . Long term (current) use of anticoagulants   . Unspecified hypothyroidism 02/08/2013  . GERD (gastroesophageal reflux disease) 02/08/2013  . Depressive disorder, not elsewhere classified   . Unspecified constipation     Past Surgical History  Procedure Laterality Date  . Tubal ligation  1964  . Total knee arthroplasty  1999    right knee  . Tummy tuck  2000    pt "almost died"; respiratory distress, became delrious  . Total knee arthroplasty  2003    left knee  . Cataract extraction  2008  . Pacemaker placement  10/21/2010    dural chamber Dr. Graciela Husbands    Current Outpatient Prescriptions  Medication Sig Dispense Refill  . acetaminophen (TYLENOL) 325 MG tablet Take 650 mg by mouth every 4 (four) hours as needed.       Marland Kitchen amiodarone (PACERONE) 200 MG tablet Take 100 mg by mouth daily.       . Calcium-Vitamin D (CALTRATE 600 PLUS-VIT D PO) Take 600 mg by mouth 2 (two) times daily.      Marland Kitchen  ezetimibe (ZETIA) 10 MG tablet Take 10 mg by mouth daily.      . hydrocortisone cream 1 % Apply topically as needed.      Marland Kitchen levothyroxine (SYNTHROID, LEVOTHROID) 25 MCG tablet Take 50 mcg by mouth daily.       . magnesium oxide (MAG-OX) 400 MG tablet Take 400 mg by mouth daily. 1 tab daily       . metroNIDAZOLE (METROGEL) 1 % gel Apply topically daily.      . niacin (NIASPAN) 500 MG CR tablet Take 1,000 mg by mouth at bedtime.       . NON FORMULARY 1.5-2 L/min. Oxygen      . omeprazole (PRILOSEC) 20 MG capsule Take 20 mg by mouth daily.        . polyethylene glycol powder (MIRALAX) powder Take 17 g by mouth daily. As directed      . potassium chloride SA (K-DUR,KLOR-CON) 20 MEQ tablet Take 20 mEq by mouth daily.       Marland Kitchen  torsemide (DEMADEX) 20 MG tablet Take 40 mg by mouth daily.       Marland Kitchen triamcinolone (NASACORT AQ) 55 MCG/ACT nasal inhaler Place 2 sprays into the nose daily.  1 Inhaler  12  . warfarin (COUMADIN) 1 MG tablet Use as directed      . warfarin (COUMADIN) 2.5 MG tablet Take as directed by coumadin clinic      . warfarin (COUMADIN) 4 MG tablet Take once a day on Wed, Fri; 4:00 pm - 6:00 pm.  30 tablet  3   No current facility-administered medications for this visit.    Allergies  Allergen Reactions  . Codeine     Extreme lethargy  . Iron     By infusion  . Morphine   . Sulfa Antibiotics   . Toviaz (Fesoterodine Fumarate)     Review of Systems negative except from HPI and PMH  Physical Exam BP 128/70  Pulse 75  Ht 5\' 4"  (1.626 m)  Wt 230 lb (104.327 kg)  BMI 39.46 kg/m2 Well developed and well nourished wearing O2 HENT normal E scleral and icterus clear Neck Supple\ Clear to ausculation  Regular rate and rhythm, no murmurs gallops or rub Soft with active bowel sounds No clubbing cyanosis none Edema Alert and oriented, grossly normal motor and sensory function Skin Warm and Dry  ECG demonstrates sinus rhythm at 75 Intervals 16/10/38 Nonspecific ST-T changes  Assessment and  Plan

## 2013-04-02 NOTE — Assessment & Plan Note (Signed)
Stable at 60% pacing and reasonable heart rate excursion o

## 2013-04-02 NOTE — Assessment & Plan Note (Signed)
The patient's device was interrogated.  The information was reviewed. No changes were made in the programming.    

## 2013-04-02 NOTE — Assessment & Plan Note (Signed)
Euvolemic. Continue current medications 

## 2013-04-02 NOTE — Assessment & Plan Note (Signed)
No intercurrent atrial fibrillation to speak of. Surveillance laboratories are not in our records. I've asked that they be done every 6 months at [facility] and sent to [place].

## 2013-04-02 NOTE — Patient Instructions (Signed)
Your physician wants you to follow-up in: 6 months with Dr. Klein. You will receive a reminder letter in the mail two months in advance. If you don't receive a letter, please call our office to schedule the follow-up appointment.  Your physician recommends that you continue on your current medications as directed. Please refer to the Current Medication list given to you today.  

## 2013-04-04 DIAGNOSIS — E039 Hypothyroidism, unspecified: Secondary | ICD-10-CM | POA: Diagnosis not present

## 2013-04-04 DIAGNOSIS — IMO0001 Reserved for inherently not codable concepts without codable children: Secondary | ICD-10-CM | POA: Diagnosis not present

## 2013-04-10 ENCOUNTER — Encounter: Payer: Self-pay | Admitting: Geriatric Medicine

## 2013-04-10 ENCOUNTER — Non-Acute Institutional Stay: Payer: Medicare Other | Admitting: Geriatric Medicine

## 2013-04-10 VITALS — BP 148/72 | HR 76 | Ht 64.0 in | Wt 230.0 lb

## 2013-04-10 DIAGNOSIS — I4891 Unspecified atrial fibrillation: Secondary | ICD-10-CM | POA: Diagnosis not present

## 2013-04-10 DIAGNOSIS — R059 Cough, unspecified: Secondary | ICD-10-CM | POA: Diagnosis not present

## 2013-04-10 DIAGNOSIS — Z7901 Long term (current) use of anticoagulants: Secondary | ICD-10-CM | POA: Diagnosis not present

## 2013-04-10 DIAGNOSIS — R05 Cough: Secondary | ICD-10-CM | POA: Diagnosis not present

## 2013-04-10 MED ORDER — OMEPRAZOLE 20 MG PO CPDR: 20.0000 mg | DELAYED_RELEASE_CAPSULE | Freq: Every day | ORAL | Status: DC

## 2013-04-10 MED ORDER — FLUTICASONE PROPIONATE 50 MCG/ACT NA SUSP: 2.0000 | Freq: Two times a day (BID) | NASAL | Status: AC

## 2013-04-10 NOTE — Assessment & Plan Note (Signed)
Heart remains in regular rhythm, rate controlled. Recent evaluation by Dr.Klein satisfactory. No medication changes. He would like to  continue lab surveillance re: amiodarone every 6 months with LFTs and TSH. Will send 01/2013 to his office as well as future results. Cindy Cervantes

## 2013-04-10 NOTE — Assessment & Plan Note (Signed)
Continues with cough especially in the morning. Patient has persistent postnasal drip, also cough also feels as if she's coughing up drainage from her stomach. Will increase Flonase to b.i.d. dosing, change omeprazole 2 q.h.s. dosing.

## 2013-04-10 NOTE — Progress Notes (Signed)
Patient ID: Cindy Cervantes, female   DOB: 19-Oct-1931, 79 y.o.   MRN: [id number] Patient ID: Cindy Cervantes, female   DOB: Apr 15, 1932, 79 y.o.   MRN: [id number] [facility] [phone])  Chief Complaint  Patient presents with  . Medical Managment of Chronic Issues    coag check     HPI: This is a 79 y.o. female resident of [facility],section evaluated today for coagulation management. Patient has been taking her warfarin as directed, no new medications or diet changes. Patient returned to Dr.Klein last week for routine followup of AF, PPM check. Evaluation satisfactory, no medication changes recommended She's to return to see him in 6 months. Patient continues with significant postnasal drip causing coughing especially in the morning. She is using Flonase each evening, and receives omeprazole each morning. Will be taking a trip to [place] next week with family - travelling in RV!  Allergies  Allergies  Allergen Reactions  . Codeine     Extreme lethargy  . Iron     By infusion  . Morphine   . Sulfa Antibiotics   . Toviaz (Fesoterodine Fumarate)    Medications Reviewed  Data Reviewed    [id number], external  08/07/12 WBC 7.5, Hgb 11.7, Hct 35.1, Plt 167   Glu 97, BUN 25, Cr. 1.37, Na 143, K+ 4.2   TC 226, TG 79, HDL 62, LDL 143     11/06/2012: INR 2.20  12/06/12 INR 2.2   total cholesterol 198, triglyceride 88, HDL 57, LDL 147.  01/01/2013 INR 2.08   Sodium 143, Potassium 3.7, C02 38, glucose 85,  BUN 24, Creatinine 1.66, T Protein 5.9   TSH 3.365    POCT 12/06/12 INR 2.2  02/06/13 INR 2.9 03/06/13 INR 2.2 04/11/2013 INR 2.2  Review of Systems  DATA OBTAINED: from patient, GENERAL: Feels well  No fevers, fatigue, change in activity status, appetite, or weight  RESPIRATORY: Morning cough, No wheezing, SOB. New, compact O2 concentrator CARDIAC: No chest pain, palpitations. No edema GI: No abdominal pain  No N/V/D or  constipation  No heartburn or reflux  MUSCULOSKELETAL: No joint pain, swelling or stiffness No back pain No muscle ache, pain, weakness Gait is steady No recent falls  NEUROLOGIC: No dizziness, fainting, headache No change in mental status  PSYCHIATRIC: No feelings of anxiety, depression  Sleeps well    Physical Exam Filed Vitals:   04/10/13 0907  BP: 148/72  Pulse: 76  Height: 5\' 4"  (1.626 m)  Weight: 230 lb (104.327 kg)  SpO2: 99%   Body mass index is 39.46 kg/(m^2).  GENERAL APPEARANCE: No acute distress, appropriately groomed, Obese body habitus Alert, pleasant, conversant. HEAD: Normocephalic, atraumatic EYES: Conjunctiva/lids clear  RESPIRATORY: Breathing is even, unlabored Lung sounds are decreased on rt (not new), clear and full on left CARDIOVASCULAR: Heart RRR   No murmur or extra heart sounds   EDEMA: No peripheral edema   MUSCULOSKELETAL.  Gait is steady PSYCHIATRIC: Mood and affect appropriate to situation   ASSESSMENT/PLAN  Cough Continues with cough especially in the morning. Patient has persistent postnasal drip, also cough also feels as if she's coughing up drainage from her stomach. Will increase Flonase to b.i.d. dosing, change omeprazole 2 q.h.s. dosing.  Long term current use of anticoagulant INR remains in therapeutic range, continue current Coumadin dose, and recheck INR in 5 weeks  Atrial fibrillation Heart remains in regular rhythm, rate controlled. Recent evaluation by Dr.Klein satisfactory. No medication changes. He  would like to  continue lab surveillance re: amiodarone every 6 months with LFTs and TSH. Will send 01/2013 to his office as well as future results. .    Follow up: 5 weeks , INR  Cindy Cervantes T.Cindy Postell, NP-C 04/10/2013

## 2013-04-10 NOTE — Assessment & Plan Note (Signed)
INR remains in therapeutic range, continue current Coumadin dose, and recheck INR in 5 weeks

## 2013-04-17 ENCOUNTER — Ambulatory Visit: Payer: Medicare Other | Admitting: Geriatric Medicine

## 2013-04-30 ENCOUNTER — Encounter: Payer: Self-pay | Admitting: Internal Medicine

## 2013-04-30 DIAGNOSIS — I4891 Unspecified atrial fibrillation: Secondary | ICD-10-CM | POA: Diagnosis not present

## 2013-05-15 ENCOUNTER — Non-Acute Institutional Stay: Payer: Medicare Other | Admitting: Geriatric Medicine

## 2013-05-15 ENCOUNTER — Encounter: Payer: Self-pay | Admitting: Geriatric Medicine

## 2013-05-15 VITALS — BP 144/66 | HR 79 | Ht 64.0 in | Wt 231.0 lb

## 2013-05-15 DIAGNOSIS — I4891 Unspecified atrial fibrillation: Secondary | ICD-10-CM

## 2013-05-15 DIAGNOSIS — L719 Rosacea, unspecified: Secondary | ICD-10-CM

## 2013-05-15 DIAGNOSIS — Z7901 Long term (current) use of anticoagulants: Secondary | ICD-10-CM | POA: Diagnosis not present

## 2013-05-15 HISTORY — DX: Rosacea, unspecified: L71.9

## 2013-05-15 NOTE — Assessment & Plan Note (Signed)
Heart remains regular rhythm, rate is stable, continue current medications including anticoagulation

## 2013-05-15 NOTE — Progress Notes (Signed)
Patient ID: Cindy Cervantes, female   DOB: [birth year], 79 y.o.   MRN: [id number] [facility] [phone])  Code Status: Full Code  Contact Information   Name Relation Home Work Mobile   Gooding,Leslie Daughter [phone]         Chief Complaint  Patient presents with  . Medical Managment of Chronic Issues    coag check    HPI: This is a 79 y.o. female resident of [facility],section evaluated today for coagulation management. Patient has been taking her warfarin as directed, no new medications or diet changes. Had a very good trip to [address] a few weeks ago.  Allergies  Allergies  Allergen Reactions  . Codeine     Extreme lethargy  . Iron     By infusion  . Morphine   . Sulfa Antibiotics   . Toviaz [Fesoterodine Fumarate]    Medications Reviewed  Data Reviewed    [id number], external  08/07/12 WBC 7.5, Hgb 11.7, Hct 35.1, Plt 167   Glu 97, BUN 25, Cr. 1.37, Na 143, K+ 4.2   TC 226, TG 79, HDL 62, LDL 143     11/06/2012: INR 2.20  12/06/12 INR 2.2   total cholesterol 198, triglyceride 88, HDL 57, LDL 147.  01/01/2013 INR 2.08   Sodium 143, Potassium 3.7, C02 38, glucose 85,  BUN 24, Creatinine 1.66, T Protein 5.9   TSH 3.365  04/30/2013 LFTs WNL   POCT 02/06/13 INR 2.9 03/06/13 INR 2.2 04/11/2013 INR 2.2 05/15/13 INR 2.6  Review of Systems  DATA OBTAINED: from patient, GENERAL: Feels well  No fevers, fatigue, change in activity status, appetite, or weight  SKIN: Rash on nose RESPIRATORY: Morning cough, No wheezing, SOB. New, compact O2 concentrator CARDIAC: No chest pain, palpitations. No edema GI: No abdominal pain  No N/V/D or constipation  No heartburn or reflux  MUSCULOSKELETAL: No joint pain, swelling or stiffness No back pain No muscle ache, pain, weakness Gait is steady No recent falls  NEUROLOGIC: No dizziness, fainting, headache No change in mental status  PSYCHIATRIC: No feelings of anxiety,  depression  Sleeps well    Physical Exam Filed Vitals:   05/15/13 0934  BP: 144/66  Pulse: 79  Height: 5\' 4"  (1.626 m)  Weight: 231 lb (104.781 kg)  SpO2: 99%   Body mass index is 39.63 kg/(m^2).  GENERAL APPEARANCE: No acute distress, appropriately groomed, Obese body habitus Alert, pleasant, conversant. SKIN: Red, raised papules distributed across the nose. No vesicles HEAD: Normocephalic, atraumatic EYES: Conjunctiva/lids clear  RESPIRATORY: Breathing is even, unlabored Lung sounds are decreased on rt (not new), clear and full on left CARDIOVASCULAR: Heart RRR   No murmur or extra heart sounds   EDEMA: No peripheral edema   MUSCULOSKELETAL.  Gait is steady PSYCHIATRIC: Mood and affect appropriate to situation   ASSESSMENT/PLAN  Rosacea Recurrent breakout on nose. HAS not used Metrogel lately, recommend restart daily application. HAS scheduled f/u with dermatology later this month  Long term current use of anticoagulant INR remains in therapeutic range, continue current warfarin dose, recheck INR in 5 weeks  Atrial fibrillation Heart remains regular rhythm, rate is stable, continue current medications including anticoagulation    Follow up: 5 weeks , INR  Teddy Rebstock T.Jeremie Giangrande, NP-C 05/15/2013

## 2013-05-15 NOTE — Assessment & Plan Note (Signed)
INR remains in therapeutic range, continue current warfarin dose, recheck INR in 5 weeks

## 2013-05-15 NOTE — Assessment & Plan Note (Signed)
Recurrent breakout on nose. HAS not used Metrogel lately, recommend restart daily application. HAS scheduled f/u with dermatology later this month

## 2013-05-30 DIAGNOSIS — L820 Inflamed seborrheic keratosis: Secondary | ICD-10-CM | POA: Diagnosis not present

## 2013-05-30 DIAGNOSIS — L821 Other seborrheic keratosis: Secondary | ICD-10-CM | POA: Diagnosis not present

## 2013-05-30 DIAGNOSIS — L719 Rosacea, unspecified: Secondary | ICD-10-CM | POA: Diagnosis not present

## 2013-05-30 DIAGNOSIS — D234 Other benign neoplasm of skin of scalp and neck: Secondary | ICD-10-CM | POA: Diagnosis not present

## 2013-05-30 DIAGNOSIS — D2330 Other benign neoplasm of skin of unspecified part of face: Secondary | ICD-10-CM | POA: Diagnosis not present

## 2013-06-17 ENCOUNTER — Telehealth: Payer: Self-pay | Admitting: Pulmonary Disease

## 2013-06-17 DIAGNOSIS — G4733 Obstructive sleep apnea (adult) (pediatric): Secondary | ICD-10-CM

## 2013-06-17 NOTE — Telephone Encounter (Signed)
i spoke with pt. She is aware order has been placed for new supplies. Nothing further needed

## 2013-06-24 ENCOUNTER — Ambulatory Visit: Payer: Medicare Other

## 2013-06-24 ENCOUNTER — Ambulatory Visit
Admission: RE | Admit: 2013-06-24 | Discharge: 2013-06-24 | Disposition: A | Discharge status: Home / Self Care | Payer: Medicare Other | Source: Ambulatory Visit | Attending: Internal Medicine | Admitting: Internal Medicine

## 2013-06-24 DIAGNOSIS — M8580 Other specified disorders of bone density and structure, unspecified site: Secondary | ICD-10-CM

## 2013-06-24 DIAGNOSIS — M899 Disorder of bone, unspecified: Secondary | ICD-10-CM | POA: Diagnosis not present

## 2013-06-24 DIAGNOSIS — Z1231 Encounter for screening mammogram for malignant neoplasm of breast: Secondary | ICD-10-CM | POA: Diagnosis not present

## 2013-06-26 ENCOUNTER — Non-Acute Institutional Stay: Payer: Medicare Other | Admitting: Geriatric Medicine

## 2013-06-26 ENCOUNTER — Encounter: Payer: Self-pay | Admitting: Geriatric Medicine

## 2013-06-26 VITALS — BP 144/76 | HR 62 | Ht 64.0 in | Wt 228.0 lb

## 2013-06-26 DIAGNOSIS — E669 Obesity, unspecified: Secondary | ICD-10-CM | POA: Diagnosis not present

## 2013-06-26 DIAGNOSIS — I4891 Unspecified atrial fibrillation: Secondary | ICD-10-CM | POA: Diagnosis not present

## 2013-06-26 DIAGNOSIS — I10 Essential (primary) hypertension: Secondary | ICD-10-CM

## 2013-06-26 DIAGNOSIS — Z7901 Long term (current) use of anticoagulants: Secondary | ICD-10-CM | POA: Diagnosis not present

## 2013-06-26 NOTE — Assessment & Plan Note (Signed)
Patient has not had any success in reducing her weight with current diet and activity. She is interested in trying a diet plan which includes a daily shake of fruit and vegetables, this will be followed by lunch and dinner with emphasis on fresh whole foods.  This sounds like a reasonable attempt at weight reduction through diet modification. We'll need to monitor patient's INR more closely with increase in vegetables, specifically spinach.

## 2013-06-26 NOTE — Assessment & Plan Note (Signed)
INR remained subtherapeutic range, continue current warfarin dose recheck INR in 3 weeks as patient's diet is likely to be changing and may require dosage adjustment.

## 2013-06-26 NOTE — Progress Notes (Signed)
Patient ID: AUNNA SNOOKS, female   DOB: 1932/07/31, 79 y.o.   MRN: [id number] Patient ID: AUDRINNA SHERMAN, female   DOB: 12/02/1931, 79 y.o.   MRN: [id number] [facility] [phone])  Code Status: Full Code  Contact Information   Name Relation Home Work Mobile   Gooding,Leslie Daughter [phone]         Chief Complaint  Patient presents with  . Medical Managment of Chronic Issues    coag check    HPI: This is a 79 y.o. female resident of [facility],section evaluated today for coagulation management. Patient has been taking her warfarin as directed, no new medications or diet changes. This patient is considering a diet change that will include a daily fruit and vegetable shake. She asks if this would be okay with her current medications. Patient's daughter and son have both had success with weight loss with this diet plan which includes a daily shake along with low carbohydrate and protein.  Allergies  Allergies  Allergen Reactions  . Codeine     Extreme lethargy  . Iron     By infusion  . Morphine   . Sulfa Antibiotics   . Toviaz [Fesoterodine Fumarate]    Medications Reviewed  Data Reviewed    VHQ:IONGEXB, external  08/07/12 WBC 7.5, Hgb 11.7, Hct 35.1, Plt 167   Glu 97, BUN 25, Cr. 1.37, Na 143, K+ 4.2   TC 226, TG 79, HDL 62, LDL 143     11/06/2012: INR 2.20  12/06/12 INR 2.2   total cholesterol 198, triglyceride 88, HDL 57, LDL 147.  01/01/2013 INR 2.08   Sodium 143, Potassium 3.7, C02 38, glucose 85,  BUN 24, Creatinine 1.66, T Protein 5.9   TSH 3.365  04/30/2013 LFTs WNL   POCT 04/11/2013 INR 2.2 05/15/13 INR 2.6   06/26/13 INR 2.0  Review of Systems  DATA OBTAINED: from patient, GENERAL: Feels well  No fevers, fatigue, change in activity status, appetite, or weight  SKIN: Rash on nose RESPIRATORY: Morning cough, No wheezing, SOB. New, compact O2 concentrator CARDIAC: No chest pain, palpitations. No  edema GI: No abdominal pain  No N/V/D or constipation  No heartburn or reflux  MUSCULOSKELETAL: No joint pain, swelling or stiffness No back pain No muscle ache, pain, weakness Gait is steady No recent falls  NEUROLOGIC: No dizziness, fainting, headache No change in mental status  PSYCHIATRIC: No feelings of anxiety, depression  Sleeps well    Physical Exam Filed Vitals:   06/26/13 1101  BP: 144/76  Pulse: 62  Height: 5\' 4"  (1.626 m)  Weight: 228 lb (103.42 kg)   Body mass index is 39.12 kg/(m^2).  GENERAL APPEARANCE: No acute distress, appropriately groomed, Obese body habitus Alert, pleasant, conversant. SKIN: Red, raised papules distributed across the nose. No vesicles HEAD: Normocephalic, atraumatic EYES: Conjunctiva/lids clear  RESPIRATORY: Breathing is even, unlabored Lung sounds are decreased on rt (not new), clear and full on left CARDIOVASCULAR: Heart RRR   No murmur or extra heart sounds   EDEMA: No peripheral edema   MUSCULOSKELETAL.  Gait is steady PSYCHIATRIC: Mood and affect appropriate to situation   ASSESSMENT/PLAN  HYPERTENSION Remains well controlled on current medications.  Atrial fibrillation Heart regular rhythm, rate remains well-controlled. Continue current medications including anticoagulation  Long term current use of anticoagulant INR remained subtherapeutic range, continue current warfarin dose recheck INR in 3 weeks as patient's diet is likely to be changing and may require dosage adjustment.  Obesity Patient has not had any success in reducing her weight with current diet and activity. She is interested in trying a diet plan which includes a daily shake of fruit and vegetables, this will be followed by lunch and dinner with emphasis on fresh whole foods.  This sounds like a reasonable attempt at weight reduction through diet modification. We'll need to monitor patient's INR more closely with increase in vegetables, specifically spinach.    Follow  up: 3weeks , INR  Jyra Lagares T.Marlean Mortell, NP-C 06/26/2013

## 2013-06-26 NOTE — Assessment & Plan Note (Signed)
Remains well controlled on current medications. 

## 2013-06-26 NOTE — Assessment & Plan Note (Signed)
Heart regular rhythm, rate remains well-controlled. Continue current medications including anticoagulation

## 2013-07-08 ENCOUNTER — Ambulatory Visit (INDEPENDENT_AMBULATORY_CARE_PROVIDER_SITE_OTHER): Payer: Medicare Other | Admitting: *Deleted

## 2013-07-08 ENCOUNTER — Encounter: Payer: Self-pay | Admitting: Internal Medicine

## 2013-07-08 DIAGNOSIS — Z950 Presence of cardiac pacemaker: Secondary | ICD-10-CM

## 2013-07-08 DIAGNOSIS — I495 Sick sinus syndrome: Secondary | ICD-10-CM

## 2013-07-11 LAB — REMOTE PACEMAKER DEVICE
AL IMPEDENCE PM: 450 Ohm
AL THRESHOLD: 0.375 V
ATRIAL PACING PM: 52
BAMS-0003: 60 {beats}/min
DEVICE MODEL PM: 7198677
RV LEAD THRESHOLD: 1 V

## 2013-07-16 ENCOUNTER — Encounter: Payer: Self-pay | Admitting: *Deleted

## 2013-07-17 ENCOUNTER — Non-Acute Institutional Stay: Payer: Medicare Other | Admitting: Geriatric Medicine

## 2013-07-17 ENCOUNTER — Encounter: Payer: Self-pay | Admitting: Geriatric Medicine

## 2013-07-17 VITALS — BP 132/62 | HR 72 | Wt 229.0 lb

## 2013-07-17 DIAGNOSIS — Z7901 Long term (current) use of anticoagulants: Secondary | ICD-10-CM | POA: Diagnosis not present

## 2013-07-17 DIAGNOSIS — E669 Obesity, unspecified: Secondary | ICD-10-CM | POA: Diagnosis not present

## 2013-07-17 MED ORDER — WARFARIN SODIUM 4 MG PO TABS: ORAL_TABLET | ORAL | Status: DC

## 2013-07-17 MED ORDER — WARFARIN SODIUM 2.5 MG PO TABS: 2.5000 mg | ORAL_TABLET | ORAL | Status: DC

## 2013-07-17 NOTE — Progress Notes (Signed)
Patient ID: Cindy Cervantes, female   DOB: October 13, 1931, 79 y.o.   MRN: [id number] [facility] [phone])  Code Status: Full Code  Contact Information   Name Relation Home Work Mobile   Gooding,Leslie Daughter [phone]         Chief Complaint  Patient presents with  . Medical Managment of Chronic Issues    coag check     HPI: This is a 79 y.o. female resident of [facility],section evaluated today for coagulation management. Patient has been taking her warfarin as directed, no new medications. Has made a diet change with fruit/ vegetable shake in the morning. Has also started exercising, using the NuStep machine in [place] [facility]. Has just started, plans to go every other day. Is a little discouraged today as she has not lost any weight   Allergies  Allergies  Allergen Reactions  . Codeine     Extreme lethargy  . Iron     By infusion  . Morphine   . Sulfa Antibiotics   . Toviaz [Fesoterodine Fumarate]    Medications Reviewed  Data Reviewed    QMV:HQIONGE, external  08/07/12 WBC 7.5, Hgb 11.7, Hct 35.1, Plt 167   Glu 97, BUN 25, Cr. 1.37, Na 143, K+ 4.2   TC 226, TG 79, HDL 62, LDL 143     11/06/2012: INR 2.20  12/06/12 INR 2.2   total cholesterol 198, triglyceride 88, HDL 57, LDL 147.  01/01/2013 INR 2.08   Sodium 143, Potassium 3.7, C02 38, glucose 85,  BUN 24, Creatinine 1.66, T Protein 5.9   TSH 3.365  04/30/2013 LFTs WNL   POCT 04/11/2013 INR 2.2 05/15/13 INR 2.6   06/26/13 INR 2.0 07/17/2013 INR 1.8  Review of Systems  DATA OBTAINED: from patient, GENERAL: Feels well  No fevers, fatigue, change in activity status, appetite, or weight  SKIN: Rash on nose RESPIRATORY: Morning cough, No wheezing, SOB. Carries compact O2 concentrator CARDIAC: No chest pain, palpitations. No edema GI: No abdominal pain  No N/V/D or constipation  No heartburn or reflux  MUSCULOSKELETAL: No joint pain, swelling or stiffness  No back pain  Mild right upper arm discomfort, no weakness   Gait is steady   No recent falls  NEUROLOGIC: No dizziness, fainting, headache No change in mental status  PSYCHIATRIC: No feelings of anxiety, depression  Sleeps well    Physical Exam Filed Vitals:   07/17/13 0937  BP: 132/62  Pulse: 72  Weight: 229 lb (103.874 kg)  SpO2: 91%   Body mass index is 39.29 kg/(m^2).  GENERAL APPEARANCE: No acute distress, appropriately groomed, Obese body habitus Alert, pleasant, conversant. SKIN: Red, raised papules resolving HEAD: Normocephalic, atraumatic EYES: Conjunctiva/lids clear  RESPIRATORY: Breathing is even, unlabored Lung sounds are decreased on rt (not new), clear and full on left CARDIOVASCULAR: Heart RRR   No murmur or extra heart sounds   EDEMA: No peripheral edema   MUSCULOSKELETAL.  Gait is steady PSYCHIATRIC: Mood and affect appropriate to situation   ASSESSMENT/PLAN  Long term current use of anticoagulant INR just below therapeutic range, likely due to addition of spinach to diet. Patient  plans to continue this diet plan. Increase warfarin 1 mg per week. Next INR 3 weeks  Obesity Patient started new diet plan approximately 3 weeks ago, but does admit to some variances including desserts. She also began exercising last week, starting slowly with NuStep machine. Intends to exercise every other day. Perforated coaching and encouragement regarding  diet with emphasis on all foods and creating a scheduled place for exercise in her daily activity  Time: 40 minutes, >50% spent counseling/ care coordination  Follow up: 3weeks , INR  Calvyn Kurtzman T.Bueford Arp, NP-C 07/17/2013

## 2013-07-17 NOTE — Assessment & Plan Note (Signed)
Patient started new diet plan approximately 3 weeks ago, but does admit to some variances including desserts. She also began exercising last week, starting slowly with NuStep machine. Intends to exercise every other day. Perforated coaching and encouragement regarding diet with emphasis on all foods and creating a scheduled place for exercise in her daily activity

## 2013-07-17 NOTE — Assessment & Plan Note (Signed)
INR just below therapeutic range, likely due to addition of spinach to diet. Patient  plans to continue this diet plan. Increase warfarin 1 mg per week. Next INR 3 weeks

## 2013-08-07 ENCOUNTER — Non-Acute Institutional Stay: Payer: Medicare Other | Admitting: Geriatric Medicine

## 2013-08-07 ENCOUNTER — Encounter: Payer: Self-pay | Admitting: Geriatric Medicine

## 2013-08-07 VITALS — BP 152/78 | HR 77 | Ht 64.0 in | Wt 227.0 lb

## 2013-08-07 DIAGNOSIS — Z7901 Long term (current) use of anticoagulants: Secondary | ICD-10-CM | POA: Diagnosis not present

## 2013-08-07 DIAGNOSIS — E669 Obesity, unspecified: Secondary | ICD-10-CM | POA: Diagnosis not present

## 2013-08-07 NOTE — Assessment & Plan Note (Signed)
Patient is seeing some results with weight loss, encouraged her to continue her to current diet plan and increased activity.

## 2013-08-07 NOTE — Assessment & Plan Note (Signed)
INR still below therapeutic range, increase warfarin dose again. Have asked patient not alter her diet at this time. Next INR 2 weeks

## 2013-08-07 NOTE — Progress Notes (Signed)
Patient ID: Cindy Cervantes, female   DOB: 29-Jun-1932, 79 y.o.   MRN: [id number] [facility] [phone]) Code Status: Full Code  Contact Information   Name Relation Home Work Mobile   Gooding,Leslie Daughter [phone]         Chief Complaint  Patient presents with  . Medical Managment of Chronic Issues    coag check    HPI: This is a 79 y.o. female resident of [facility],section evaluated today for coagulation management.  Patient reports she is continuing her diet plan with vegetable shakes. Notes her weight on her home scale has been ranging 11/24/2021 pounds this is down from a high of 230 pounds. Reports feeling better and therefore being a little bit more active. Has been to the gym a few times using the new step machine also has been reintroduced to weight machines for upper body strengthening.  Last visit: Long term current use of anticoagulant INR just below therapeutic range, likely due to addition of spinach to diet. Patient  plans to continue this diet plan. Increase warfarin 1 mg per week. Next INR 3 weeks  Obesity Patient started new diet plan approximately 3 weeks ago, but does admit to some variances including desserts. She also began exercising last week, starting slowly with NuStep machine. Intends to exercise every other day. Perforated coaching and encouragement regarding diet with emphasis on all foods and creating a scheduled place for exercise in her daily activity   Allergies  Allergies  Allergen Reactions  . Codeine     Extreme lethargy  . Iron     By infusion  . Morphine   . Sulfa Antibiotics   . Toviaz [Fesoterodine Fumarate]    Medications Reviewed  Data Reviewed    NFA:OZHYQMV, external  08/07/12 WBC 7.5, Hgb 11.7, Hct 35.1, Plt 167   Glu 97, BUN 25, Cr. 1.37, Na 143, K+ 4.2   TC 226, TG 79, HDL 62, LDL 143     11/06/2012: INR 2.20  12/06/12 INR 2.2   total cholesterol 198,  triglyceride 88, HDL 57, LDL 147.  01/01/2013 INR 2.08   Sodium 143, Potassium 3.7, C02 38, glucose 85,  BUN 24, Creatinine 1.66, T Protein 5.9   TSH 3.365  04/30/2013 LFTs WNL   POCT 05/15/13 INR 2.6   06/26/13 INR 2.0 07/17/2013 INR 1.8    08/07/2013 INR 1.7  Review of Systems  DATA OBTAINED: from patient, GENERAL: Feels well  No fevers, fatigue, change in activity status, appetite, or weight  SKIN: Rash on nose RESPIRATORY: Morning cough, No wheezing, SOB. Carries compact O2 concentrator CARDIAC: No chest pain, palpitations. No edema GI: No abdominal pain  No N/V/D or constipation  No heartburn or reflux  MUSCULOSKELETAL: No joint pain, swelling or stiffness No back pain  Mild right upper arm discomfort, no weakness   Gait is steady   No recent falls  NEUROLOGIC: No dizziness, fainting, headache No change in mental status  PSYCHIATRIC: No feelings of anxiety, depression  Sleeps well    Physical Exam Filed Vitals:   08/07/13 0926  BP: 152/78  Pulse: 77  Height: 5\' 4"  (1.626 m)  Weight: 227 lb (102.967 kg)  SpO2: 99%   Body mass index is 38.95 kg/(m^2).  GENERAL APPEARANCE: No acute distress, appropriately groomed, Obese body habitus Alert, pleasant, conversant. SKIN: Red, raised papules resolving HEAD: Normocephalic, atraumatic EYES: Conjunctiva/lids clear  RESPIRATORY: Breathing is even, unlabored Lung sounds are decreased on rt (not new),  clear and full on left CARDIOVASCULAR: Heart RRR   No murmur or extra heart sounds   EDEMA: No peripheral edema   MUSCULOSKELETAL.  Gait is steady PSYCHIATRIC: Mood and affect appropriate to situation   ASSESSMENT/PLAN  Long term current use of anticoagulant INR still below therapeutic range, increase warfarin dose again. Have asked patient not alter her diet at this time. Next INR 2 weeks  Obesity Patient is seeing some results with weight loss, encouraged her to continue her to current diet plan and increased activity.   Follow  up: 2weeks , INR  Lynasia Meloche T.Beldon Nowling, NP-C 08/07/2013

## 2013-08-21 ENCOUNTER — Non-Acute Institutional Stay: Payer: Medicare Other | Admitting: Geriatric Medicine

## 2013-08-21 ENCOUNTER — Encounter: Payer: Self-pay | Admitting: Geriatric Medicine

## 2013-08-21 VITALS — BP 144/68 | HR 68 | Ht 64.0 in | Wt 225.0 lb

## 2013-08-21 DIAGNOSIS — I4891 Unspecified atrial fibrillation: Secondary | ICD-10-CM | POA: Diagnosis not present

## 2013-08-21 DIAGNOSIS — E669 Obesity, unspecified: Secondary | ICD-10-CM | POA: Diagnosis not present

## 2013-08-21 DIAGNOSIS — Z7901 Long term (current) use of anticoagulants: Secondary | ICD-10-CM

## 2013-08-21 NOTE — Assessment & Plan Note (Signed)
The patient has lost another 2 pounds for a total of 4 pounds in the last month. She is improving her physical activity level slowly. Provided encouragement and counseling regarding continued diet modifications, exercise through the holiday season and winter months.

## 2013-08-21 NOTE — Assessment & Plan Note (Signed)
INR is back in therapeutic range, continue current warfarin dose. Agree with patient reducing vegetable and protein shakes to every of the day, to help keep INR in range.

## 2013-08-21 NOTE — Progress Notes (Signed)
Patient ID: Cindy Cervantes, female   DOB: 1932-06-25, 79 y.o.   MRN: [id number] [facility] [phone]) Code Status: Full Code  Contact Information   Name Relation Home Work Mobile   Gooding,Leslie Daughter [phone]        Chief Complaint  Patient presents with  . Medical Managment of Chronic Issues    coag check     HPI: This is a 79 y.o. female resident of [facility],section evaluated today for coagulation management.   Last visit: Long term current use of anticoagulant INR still below therapeutic range, increase warfarin dose again. Have asked patient not alter her diet at this time. Next INR 2 weeks  Obesity Patient is seeing some results with weight loss, encouraged her to continue her to current diet plan and increased activity.  Patient has been taking warfarin as prescribed. Continues with protein and vegetable and fruit shakes though hasn't had any in the last 2 days. Feels that she will reduce the shakes to about every other day. Just continue to increase her activity with visits to the fitness center, and walking more. In general she feels her energy is pretty good. Is looking forward to holiday visit with her family as well as an overnight trip to [place] Early December.  Allergies  Allergen Reactions  . Codeine     Extreme lethargy  . Iron     By infusion  . Morphine   . Sulfa Antibiotics   . Toviaz [Fesoterodine Fumarate]    Medications Reviewed  Data Reviewed    NFA:OZHYQMV, external  08/07/12 WBC 7.5, Hgb 11.7, Hct 35.1, Plt 167   Glu 97, BUN 25, Cr. 1.37, Na 143, K+ 4.2   TC 226, TG 79, HDL 62, LDL 143     11/06/2012: INR 2.20  12/06/12 INR 2.2   total cholesterol 198, triglyceride 88, HDL 57, LDL 147.  01/01/2013 INR 2.08   Sodium 143, Potassium 3.7, C02 38, glucose 85,  BUN 24, Creatinine 1.66, T Protein 5.9   TSH 3.365  04/30/2013 LFTs WNL   POCT 05/15/13 INR 2.6   06/26/13 INR  2.0 07/17/2013 INR 1.8    08/07/2013 INR 1.7   08/21/2013 INR 2.3   Review of Systems  DATA OBTAINED: from patient, GENERAL: Feels well  No fevers, fatigue, change in activity status, appetite, or weight  SKIN: Rash on nose RESPIRATORY: Morning cough, No wheezing, SOB. Carries compact O2 concentrator CARDIAC: No chest pain, palpitations. No edema GI: No abdominal pain  No N/V/D or constipation  No heartburn or reflux  MUSCULOSKELETAL: No joint pain, swelling or stiffness No back pain  Mild right upper arm discomfort, no weakness   Gait is steady   No recent falls  NEUROLOGIC: No dizziness, fainting, headache No change in mental status  PSYCHIATRIC: No feelings of anxiety, depression  Sleeps well    Physical Exam Filed Vitals:   08/21/13 0852  BP: 144/68  Pulse: 68  Height: 5\' 4"  (1.626 m)  Weight: 225 lb (102.059 kg)   Body mass index is 38.6 kg/(m^2).  GENERAL APPEARANCE: No acute distress, appropriately groomed, Obese body habitus Alert, pleasant, conversant. SKIN: Red, raised papules resolving HEAD: Normocephalic, atraumatic EYES: Conjunctiva/lids clear  RESPIRATORY: Breathing is even, unlabored Lung sounds are decreased on rt (not new), clear and full on left CARDIOVASCULAR: Heart RRR   No murmur or extra heart sounds   EDEMA: No peripheral edema   MUSCULOSKELETAL.  Gait is  steady PSYCHIATRIC: Mood and affect appropriate to situation   ASSESSMENT/PLAN  Atrial fibrillation Heart rhythm remains regular, rate well controlled. Continue current medications including anticoagulation  Long term current use of anticoagulant INR is back in therapeutic range, continue current warfarin dose. Agree with patient reducing vegetable and protein shakes to every of the day, to help keep INR in range.  Obesity The patient has lost another 2 pounds for a total of 4 pounds in the last month. She is improving her physical activity level slowly. Provided encouragement and counseling  regarding continued diet modifications, exercise through the holiday season and winter months.   Follow up: 4 weeks , INR  Laura Caldas T.Zayvion Stailey, NP-C 08/21/2013

## 2013-08-21 NOTE — Assessment & Plan Note (Signed)
Heart rhythm remains regular, rate well controlled. Continue current medications including anticoagulation

## 2013-08-23 ENCOUNTER — Encounter: Payer: Self-pay | Admitting: Pulmonary Disease

## 2013-08-23 ENCOUNTER — Ambulatory Visit (INDEPENDENT_AMBULATORY_CARE_PROVIDER_SITE_OTHER): Payer: Medicare Other | Admitting: Pulmonary Disease

## 2013-08-23 VITALS — BP 124/70 | HR 68 | Ht 63.0 in | Wt 228.0 lb

## 2013-08-23 DIAGNOSIS — G4733 Obstructive sleep apnea (adult) (pediatric): Secondary | ICD-10-CM | POA: Diagnosis not present

## 2013-08-23 DIAGNOSIS — G4736 Sleep related hypoventilation in conditions classified elsewhere: Secondary | ICD-10-CM

## 2013-08-23 DIAGNOSIS — R0602 Shortness of breath: Secondary | ICD-10-CM | POA: Diagnosis not present

## 2013-08-23 NOTE — Patient Instructions (Signed)
Follow up in 6 months 

## 2013-08-23 NOTE — Progress Notes (Signed)
Chief Complaint  Patient presents with  . Sleep Apnea    Using CPAP every night for 7-8 hours per night. Feels well rested the next day after use. Mask never gets a good seal. Denies problems with machine or pressure.    History of Present Illness: Cindy Cervantes is a 79 y.o. female former smoker with cough 2nd to GERD, OSA/OHS, tracheobronchomalacia, and Rt hemidiaphragm elevation.  She has been doing okay.  She is not very active >> some of this is related to joint pains.  She uses 2 liters oxygen 24/7.  She uses BiPAP at night, and has full face mask.  She does not have any difficulty with her mask.  She gets occasional cough with clear sputum.  She is not having wheeze, or fever.  She feels like a "valve" closes in her chest for her breathing if she is in a rush >> does not happen when she moves at more steady pace.  Tests: PFT's March 29 2010 >> FEV1 1.34 (72) with ratio 75 and ERV 42, DLC0 55 > corrects to 137 CT chest 10/17/10>>GGO LUL, no PE, tracheobronchomalacia involving the visualized trachea with areas of air trapping seen throughout both lungs PSG 12/14/10>>AHI 17.9, REM 40.8 BPAP titraton 01/12/11>>18/13 cm H2O with 3 liters oxygen. BPAP 02/01/11 to 03/04/12>>Used on 386 of 398 nights with average 7 hrs 33 min.  Average AHI 2.7 with BPAP 16/13 cm H2O.  Minimal airleak.  Tv 460, Ve 6.1, RR 15. 03/12/12 Change to BiPAP 15/12 cm H2O 03/05/13 Change BiPAP to 14/10 cm H2O   She  has a past medical history of Paroxysmal a-fib (with RVR); History of hyperkalemia (in setting of spironolactone 09/2010); Sinus node dysfunction; Pacemaker; Obesity; Allergic rhinitis; Hypertension; Senile cataract; Hyperlipidemia; Xerostomia; Dysphagia; Cough; OSA (obstructive sleep apnea); Diverticulosis of colon; Obesity hypoventilation syndrome; Colon polyp; Tracheobronchomalacia; Diaphragm dysfunction; Anemia of renal disease (10/21/2011); Anemia, iron deficiency (10/21/2011); MDS (myelodysplastic  syndrome), low grade (10/21/2011); Unspecified vitamin D deficiency; Dehydration; Hypopotassemia; Other specified disease of white blood cells; Chronic diastolic heart failure; Reflux esophagitis; Cholelithiasis; Osteoarthrosis, unspecified whether generalized or localized, unspecified site; Disorder of bone and cartilage, unspecified; Urinary incontinence; Debility, unspecified; Long term (current) use of anticoagulants; Hypothyroidism (02/08/2013); GERD (gastroesophageal reflux disease) (02/08/2013); Depression; Constipation; and Rosacea (05/15/2013).  She  has past surgical history that includes Tubal ligation (1964); Total knee arthroplasty (1999); Tummy tuck (2000); Total knee arthroplasty (2003); Cataract extraction (2008); and pacemaker placement (10/21/2010).  Current Outpatient Prescriptions on File Prior to Visit  Medication Sig Dispense Refill  . acetaminophen (TYLENOL) 325 MG tablet Take 650 mg by mouth every 4 (four) hours as needed.       . Aloe-Sodium Chloride (AYR SALINE NASAL GEL NA) Place into the nose. Apply topically to each nostril four times daily as needed for soreness and dryness      . amiodarone (PACERONE) 200 MG tablet Take 100 mg by mouth daily.       . Calcium-Vitamin D (CALTRATE 600 PLUS-VIT D PO) Take 600 mg by mouth 2 (two) times daily.      Marland Kitchen ezetimibe (ZETIA) 10 MG tablet Take 10 mg by mouth daily.      . fluticasone (FLONASE) 50 MCG/ACT nasal spray Place 2 sprays into the nose 2 (two) times daily. 2 sprays in each nostril      . hydrocortisone cream 1 % Apply topically as needed.      Marland Kitchen levothyroxine (SYNTHROID, LEVOTHROID) 25 MCG tablet Take 50 mcg by  mouth daily.       . Magnesium 400 MG CAPS Take by mouth. Take one tablet daily      . metroNIDAZOLE (METROGEL) 1 % gel Apply topically. Apply to rash on nose once daily      . niacin (NIASPAN) 500 MG CR tablet Take 1,000 mg by mouth at bedtime.       . NON FORMULARY 1.5-2 L/min. Oxygen      . polyethylene glycol powder  (MIRALAX) powder Take 17 g by mouth daily. As directed      . potassium chloride SA (K-DUR,KLOR-CON) 20 MEQ tablet Take 20 mEq by mouth daily.       Marland Kitchen torsemide (DEMADEX) 20 MG tablet Take 20 mg by mouth daily. Take 60mg  daily      . triamcinolone (NASACORT AQ) 55 MCG/ACT nasal inhaler Place 2 sprays into the nose daily.  1 Inhaler  12  . warfarin (COUMADIN) 4 MG tablet Take 4 mg by mouth daily at 6 PM. Take once at; 4:00 pm - 6:00 pm. Daily      . warfarin (COUMADIN) 1 MG tablet Take 1 mg by mouth as directed. Take 1 tab along with 2.5 mg tab to equal 3.5 mg each Su, M, T, Th Sa or as directed for anticoagulation       No current facility-administered medications on file prior to visit.    Allergies  Allergen Reactions  . Codeine     Extreme lethargy  . Iron     By infusion  . Morphine   . Sulfa Antibiotics   . Toviaz [Fesoterodine Fumarate]     Physical Exam:  General - No distress, wearing oxygen ENT - No sinus tenderness, clear nasal drainage, no oral exudate, no LAN Cardiac - s1s2 regular, no murmur Chest - decreased breath sounds Rt base, no wheeze/rales Back - No focal tenderness Abd - Soft, non-tender Ext - No edema Neuro - Normal strength Skin - No rashes Psych - Normal mood, and behavior   Assessment/Plan:  Coralyn Helling, MD [place] [facility] Pager:  [phone] 08/25/2013, 11:46 AM

## 2013-08-25 ENCOUNTER — Encounter: Payer: Self-pay | Admitting: Pulmonary Disease

## 2013-08-25 NOTE — Assessment & Plan Note (Signed)
2nd to OHS, tracheobronchomalacia, and Rt hemi-diaphragm elevation.  Continue BiPAP and supplemental oxygen at 2 liters.

## 2013-08-25 NOTE — Assessment & Plan Note (Signed)
Stable on BiPAP 14/10 cm H2O and oxygen 2 liter oxygen.   

## 2013-08-25 NOTE — Assessment & Plan Note (Signed)
Multifactorial. She has diastolic dysfuntion, obesity with deconditioning and hypoventilation, chronic right hemidiaphragm elevation, and tracheobronchomalacia on CT chest.  Stable on current regimen.

## 2013-09-18 ENCOUNTER — Encounter: Payer: Self-pay | Admitting: Geriatric Medicine

## 2013-09-18 ENCOUNTER — Non-Acute Institutional Stay: Payer: Medicare Other | Admitting: Geriatric Medicine

## 2013-09-18 VITALS — BP 134/76 | HR 72 | Wt 228.0 lb

## 2013-09-18 DIAGNOSIS — Z7901 Long term (current) use of anticoagulants: Secondary | ICD-10-CM | POA: Diagnosis not present

## 2013-09-18 DIAGNOSIS — I4891 Unspecified atrial fibrillation: Secondary | ICD-10-CM | POA: Diagnosis not present

## 2013-09-18 LAB — POCT INR: INR: 2.5 — AB (ref 0.9–1.1)

## 2013-09-18 NOTE — Assessment & Plan Note (Signed)
Remains in regular rhythm, HR well controlled. Continue current medication including anticoagulation

## 2013-09-18 NOTE — Progress Notes (Signed)
Patient ID: Cindy Cervantes, female   DOB: [birth year], 79 y.o.   MRN: [id number] [facility] [id number]) Code Status: Full Code  Contact Information   Name Relation Home Work Mobile   Gooding,Leslie Daughter [phone]        Chief Complaint  Patient presents with  . Medical Managment of Chronic Issues    coag check     HPI: This is a 79 y.o. female resident of [facility],section evaluated today for coagulation management.    Last visit: Long term current use of anticoagulant INR is back in therapeutic range, continue current warfarin dose. Agree with patient reducing vegetable and protein shakes to every of the day, to help keep INR in range.  Obesity The patient has lost another 2 pounds for a total of 4 pounds in the last month. She is improving her physical activity level slowly. Provided encouragement and counseling regarding continued diet modifications, exercise through the holiday season and winter months.  Since last visit, patient has continued to take warfarin as directed, no new medication. Diet is unchanged. Continues with fruit/ veg shakes QOD, has not kept up with activity. No weight change.  Allergies  Allergen Reactions  . Codeine     Extreme lethargy  . Iron     By infusion  . Morphine   . Sulfa Antibiotics   . Toviaz [Fesoterodine Fumarate]    Medications Reviewed  Data Reviewed    BMW:UXLKGMW, external  08/07/12 WBC 7.5, Hgb 11.7, Hct 35.1, Plt 167   Glu 97, BUN 25, Cr. 1.37, Na 143, K+ 4.2   TC 226, TG 79, HDL 62, LDL 143     11/06/2012: INR 2.20  12/06/12 INR 2.2   total cholesterol 198, triglyceride 88, HDL 57, LDL 147.  01/01/2013 INR 2.08   Sodium 143, Potassium 3.7, C02 38, glucose 85,  BUN 24, Creatinine 1.66, T Protein 5.9   TSH 3.365  04/30/2013 LFTs WNL   POCT 07/17/2013 INR 1.8    08/07/2013 INR 1.7   08/21/2013 INR 2.3 09/18/13 INR 2.5   Review of Systems  DATA  OBTAINED: from patient, GENERAL: Feels well  No fevers, fatigue, change in activity status, appetite, or weight  SKIN: Rash on nose RESPIRATORY: Morning cough, No wheezing, SOB. Carries compact O2 concentrator CARDIAC: No chest pain, palpitations. No edema GI: No abdominal pain  No N/V/D or constipation  No heartburn or reflux  MUSCULOSKELETAL: No joint pain, swelling or stiffness No back pain  Mild right upper arm discomfort, mild left knee discomfort,(not bad enough to take Tylenol) no weakness   Gait is steady   No recent falls  NEUROLOGIC: No dizziness, fainting, headache No change in mental status  PSYCHIATRIC: No feelings of anxiety, depression  Sleeps well    Physical Exam Filed Vitals:   09/18/13 0909  BP: 134/76  Pulse: 72  Weight: 228 lb (103.42 kg)  SpO2: 96%   Body mass index is 40.4 kg/(m^2).  GENERAL APPEARANCE: No acute distress, appropriately groomed, Obese body habitus Alert, pleasant, conversant. SKIN: Red, raised papules resolving HEAD: Normocephalic, atraumatic EYES: Conjunctiva/lids clear  RESPIRATORY: Breathing is even, unlabored Lung sounds are decreased on rt (not new), clear and full on left CARDIOVASCULAR: Heart RRR   No murmur or extra heart sounds   EDEMA: No peripheral edema   MUSCULOSKELETAL.  Gait is steady PSYCHIATRIC: Mood and affect appropriate to situation   ASSESSMENT/PLAN  Atrial fibrillation Remains in regular rhythm,  HR well controlled. Continue current medication including anticoagulation  Long term current use of anticoagulant INR remains in therapeutic range, continue current warfarin dose, next INR 4 weeks with routine lab   Follow up: next clinic visit 8 weeks , INR  Lab 10/15/2013: CBC, CMP, Mg, TSH, INR , Lipid panel, Vit D  Elyssa Pendelton T.Khiara Shuping, NP-C 09/18/2013

## 2013-09-18 NOTE — Assessment & Plan Note (Signed)
INR remains in therapeutic range, continue current warfarin dose, next INR 4 weeks with routine lab

## 2013-10-01 DIAGNOSIS — E039 Hypothyroidism, unspecified: Secondary | ICD-10-CM | POA: Diagnosis not present

## 2013-10-01 DIAGNOSIS — R945 Abnormal results of liver function studies: Secondary | ICD-10-CM | POA: Diagnosis not present

## 2013-10-15 DIAGNOSIS — I10 Essential (primary) hypertension: Secondary | ICD-10-CM | POA: Diagnosis not present

## 2013-10-15 DIAGNOSIS — Z79899 Other long term (current) drug therapy: Secondary | ICD-10-CM | POA: Diagnosis not present

## 2013-10-15 DIAGNOSIS — E039 Hypothyroidism, unspecified: Secondary | ICD-10-CM | POA: Diagnosis not present

## 2013-10-15 DIAGNOSIS — Z7901 Long term (current) use of anticoagulants: Secondary | ICD-10-CM | POA: Diagnosis not present

## 2013-10-15 DIAGNOSIS — E785 Hyperlipidemia, unspecified: Secondary | ICD-10-CM | POA: Diagnosis not present

## 2013-10-15 DIAGNOSIS — D649 Anemia, unspecified: Secondary | ICD-10-CM | POA: Diagnosis not present

## 2013-10-15 DIAGNOSIS — E559 Vitamin D deficiency, unspecified: Secondary | ICD-10-CM | POA: Diagnosis not present

## 2013-10-15 LAB — BASIC METABOLIC PANEL
BUN: 24 mg/dL — AB (ref 4–21)
CREATININE: 1.6 mg/dL — AB (ref 0.5–1.1)
GLUCOSE: 87 mg/dL
Potassium: 3.6 mmol/L (ref 3.4–5.3)
Sodium: 145 mmol/L (ref 137–147)

## 2013-10-15 LAB — CBC AND DIFFERENTIAL
HCT: 33 % — AB (ref 36–46)
Hemoglobin: 11 g/dL — AB (ref 12.0–16.0)
PLATELETS: 171 10*3/uL (ref 150–399)
WBC: 7.1 10*3/mL

## 2013-10-15 LAB — POCT INR: INR: 2.4 — AB (ref 0.9–1.1)

## 2013-10-15 LAB — LIPID PANEL
Cholesterol: 198 mg/dL (ref 0–200)
LDL Cholesterol: 122 mg/dL
TRIGLYCERIDES: 100 mg/dL (ref 40–160)

## 2013-10-15 LAB — TSH: TSH: 6.47 u[IU]/mL — AB (ref 0.41–5.90)

## 2013-10-15 LAB — HEPATIC FUNCTION PANEL
ALT: 9 U/L (ref 7–35)
Alkaline Phosphatase: 69 U/L (ref 25–125)

## 2013-10-30 ENCOUNTER — Ambulatory Visit (INDEPENDENT_AMBULATORY_CARE_PROVIDER_SITE_OTHER): Payer: Medicare Other | Admitting: Internal Medicine

## 2013-10-30 ENCOUNTER — Encounter: Payer: Self-pay | Admitting: Internal Medicine

## 2013-10-30 VITALS — BP 175/84 | HR 75 | Ht 63.0 in | Wt 229.0 lb

## 2013-10-30 DIAGNOSIS — I4891 Unspecified atrial fibrillation: Secondary | ICD-10-CM

## 2013-10-30 DIAGNOSIS — Z950 Presence of cardiac pacemaker: Secondary | ICD-10-CM | POA: Diagnosis not present

## 2013-10-30 DIAGNOSIS — I495 Sick sinus syndrome: Secondary | ICD-10-CM

## 2013-10-30 NOTE — Patient Instructions (Addendum)
Your physician recommends that you continue on your current medications as directed. Please refer to the Current Medication list given to you today.  Remote monitoring is used to monitor your Pacemaker of ICD from home. This monitoring reduces the number of office visits required to check your device to one time per year. It allows us to keep an eye on the functioning of your device to ensure it is working properly. You are scheduled for a device check from home on 02/03/14. You may send your transmission at any time that day. If you have a wireless device, the transmission will be sent automatically. After your physician reviews your transmission, you will receive a postcard with your next transmission date.  Your physician wants you to follow-up in: 1 year with Dr. Klein.  You will receive a reminder letter in the mail two months in advance. If you don't receive a letter, please call our office to schedule the follow-up appointment.    

## 2013-10-30 NOTE — Assessment & Plan Note (Signed)
Chronic dyspnea. With palpitations with exertion we will assess this by a low lwevel exercise in the office

## 2013-10-30 NOTE — Progress Notes (Signed)
Patient Care Team: Estill Dooms, MD as PCP - General Mardene Celeste, NP as Nurse Practitioner (Geriatric Medicine) [place]   HPI  Cindy Cervantes is a 80 y.o. female followup for her paroxysmal atrial fibrillation with a rapid ventricular response and sinus node dysfunction for which she required pacemaker implantation in the fall of 2011.  She takes amiodarone. She has tolerated this quite well.  .  She is using chronic oxygen. She saw a pulmonary recently and now has a hand held oxygen concentrator which she loves   Were resumed weeks if she's had a significant problem with dyspnea and tachypalpitations with modest exertion. She has not had concomitant edema.   She has a history of anemia and myelodysplasia. Her last blood work in our computer is January 2014   There has been little intercurrent atrial fibrillation   Past Medical History  Diagnosis Date  . Paroxysmal a-fib with RVR    Amiodarone  . History of hyperkalemia in setting of spironolactone 09/2010  . Sinus node dysfunction   . Pacemaker     Implanted December 2012  . Obesity   . Allergic rhinitis   . Hypertension   . Senile cataract   . Hyperlipidemia   . Xerostomia   . Dysphagia   . Cough     2nd to reflux  . OSA (obstructive sleep apnea)   . Diverticulosis of colon   . Obesity hypoventilation syndrome   . Colon polyp   . Tracheobronchomalacia   . Diaphragm dysfunction     Right hemidiaphragm  . Anemia of renal disease 10/21/2011  . Anemia, iron deficiency 10/21/2011  . MDS (myelodysplastic syndrome), low grade 10/21/2011  . Unspecified vitamin D deficiency   . Dehydration   . Hypopotassemia   . Other specified disease of white blood cells   . Chronic diastolic heart failure   . Reflux esophagitis   . Cholelithiasis   . Osteoarthrosis, unspecified whether generalized or localized, unspecified site   . Disorder of bone and cartilage, unspecified   . Urinary  incontinence   . Debility, unspecified   . Long term (current) use of anticoagulants   . Hypothyroidism 02/08/2013  . GERD (gastroesophageal reflux disease) 02/08/2013  . Depression   . Constipation   . Rosacea 05/15/2013    Past Surgical History  Procedure Laterality Date  . Tubal ligation  1964  . Total knee arthroplasty  1999    right knee  . Tummy tuck  2000    pt "almost died"; respiratory distress, became delrious  . Total knee arthroplasty  2003    left knee  . Cataract extraction  2008  . Pacemaker placement  10/21/2010    dural chamber Dr. [name]    Current Outpatient Prescriptions  Medication Sig Dispense Refill  . acetaminophen (TYLENOL) 325 MG tablet Take 650 mg by mouth every 4 (four) hours as needed.       . Aloe-Sodium Chloride (AYR SALINE NASAL GEL NA) Place into the nose. Apply topically to each nostril four times daily as needed for soreness and dryness      . amiodarone (PACERONE) 200 MG tablet Take 100 mg by mouth daily.       . Calcium-Vitamin D (CALTRATE 600 PLUS-VIT D PO) Take 600 mg by mouth 2 (two) times daily.      Marland Kitchen ezetimibe (ZETIA) 10 MG tablet Take 10 mg by mouth daily.      . fluticasone (FLONASE)  50 MCG/ACT nasal spray Place 2 sprays into the nose 2 (two) times daily. 2 sprays in each nostril      . hydrocortisone cream 1 % Apply topically as needed.      Marland Kitchen levothyroxine (SYNTHROID, LEVOTHROID) 25 MCG tablet Take 50 mcg by mouth daily.       . Magnesium 400 MG CAPS Take by mouth. Take one tablet daily      . metroNIDAZOLE (METROGEL) 1 % gel Apply topically. Apply to rash on nose once daily      . niacin (NIASPAN) 500 MG CR tablet Take 1,000 mg by mouth at bedtime.       . NON FORMULARY 1.5-2 L/min. Oxygen      . polyethylene glycol powder (MIRALAX) powder Take 17 g by mouth daily. As directed      . potassium chloride SA (K-DUR,KLOR-CON) 20 MEQ tablet Take 20 mEq by mouth daily.       Marland Kitchen torsemide (DEMADEX) 20 MG tablet Take 20 mg by mouth daily. Take  60mg  daily      . triamcinolone (NASACORT AQ) 55 MCG/ACT nasal inhaler Place 2 sprays into the nose daily.  1 Inhaler  12  . warfarin (COUMADIN) 4 MG tablet Take 4 mg by mouth daily at 6 PM. Take once at; 4:00 pm - 6:00 pm. Daily      . warfarin (COUMADIN) 1 MG tablet Take 1 mg by mouth as directed. Take 1 tab along with 2.5 mg tab to equal 3.5 mg each Su, M, T, Th Sa or as directed for anticoagulation       No current facility-administered medications for this visit.    Allergies  Allergen Reactions  . Codeine     Extreme lethargy  . Iron     By infusion  . Morphine   . Sulfa Antibiotics   . Toviaz [Fesoterodine Fumarate]     Review of Systems negative except from HPI and PMH  Physical Exam BP 175/84  Pulse 75  Ht 5\' 3"  (1.6 m)  Wt 229 lb (103.874 kg)  BMI 40.58 kg/m2 Well developed and well nourished using oxygen concentrator HENT normal E scleral and icterus clear Neck Supple JVP flat; carotids brisk and full Clear to ausculation Device pocket well healed; without hematoma or erythema.  There is no tetheringRegular rate and rhythm, no murmurs gallops or rub Soft with active bowel sounds No clubbing cyanosis none Edema Alert and oriented, grossly normal motor and sensory function Skin Warm and Dry  ECG demonstrates sinus rhythm at 75 Intervals 15/10/41 Low-voltage  Assessment and  Plan

## 2013-10-30 NOTE — Assessment & Plan Note (Signed)
Thank you very much no significant atrial fibrillation do not think this is related to her symptoms she is on anticoagulation and on amiodarone. She need surveillance laboratories

## 2013-10-30 NOTE — Assessment & Plan Note (Signed)
The patient's device was interrogated.  The information was reviewed. No changes were made in the programming.    

## 2013-11-13 ENCOUNTER — Encounter: Payer: Self-pay | Admitting: Geriatric Medicine

## 2013-11-13 ENCOUNTER — Non-Acute Institutional Stay: Payer: Medicare Other | Admitting: Geriatric Medicine

## 2013-11-13 VITALS — BP 144/70 | HR 68 | Wt 228.0 lb

## 2013-11-13 DIAGNOSIS — Z7901 Long term (current) use of anticoagulants: Secondary | ICD-10-CM | POA: Diagnosis not present

## 2013-11-13 DIAGNOSIS — I4891 Unspecified atrial fibrillation: Secondary | ICD-10-CM

## 2013-11-13 DIAGNOSIS — D509 Iron deficiency anemia, unspecified: Secondary | ICD-10-CM | POA: Diagnosis not present

## 2013-11-13 DIAGNOSIS — R109 Unspecified abdominal pain: Secondary | ICD-10-CM

## 2013-11-13 LAB — POCT INR: INR: 2.2 — AB (ref 0.9–1.1)

## 2013-11-13 NOTE — Assessment & Plan Note (Signed)
Most recent CBC with mild drop in hemoglobin and hematocrit. No signs of bleeding, no symptoms. Continue to monitor at intervals

## 2013-11-13 NOTE — Assessment & Plan Note (Signed)
INR remains in therapeutic range, continue current warfarin dose. Repeat INR in 4 weeks 

## 2013-11-13 NOTE — Assessment & Plan Note (Signed)
Patient with right upper quadrant abdominal discomfort, she describes it as "kinking" after meals. Also experiences pain and tenderness left lower quadrant and iliac crest area. We'll obtain plain x-ray today. Patient does have a history of a Tummy tuck surgery, could account for the fullness in the subcutaneous area. Patient also reports that colonoscopy results included a long and tortuous colon.

## 2013-11-13 NOTE — Assessment & Plan Note (Signed)
Heart remains in a regular rhythm, rate well controlled, continue current medications. Recent lab studies satisfactory

## 2013-11-13 NOTE — Progress Notes (Signed)
Patient ID: Cindy Cervantes, female   DOB: [birth year], 80 y.o.   MRN: [id number]  [facility] [phone])  Code Status: Full Code  Contact Information   Name Relation Home Work [place] Daughter [phone]        Chief Complaint  Patient presents with  . Medical Managment of Chronic Issues    Coag check    HPI: This is a 80 y.o. female resident of [place], [place] evaluated today for coagulation management.    Last visit: Atrial fibrillation Remains in regular rhythm, HR well controlled. Continue current medication including anticoagulation  Long term current use of anticoagulant INR remains in therapeutic range, continue current warfarin dose, next INR 4 weeks with routine lab   Since last visit, patient has not had any acute medical issues. She's continued to take warfarin as prescribed, no medication or diet changes. Routine labs are completed in January, INR remained therapeutic, remaining labs are satisfactory. The slides were reviewed by Dr. [name] at his visit recently no changes to cardiac medication regimen was recommended. He did request repeat lab in 4 months The patient today is complaining of some abdominal discomfort. She notices "kink" right upper quadrant  after she eats also experiences left lower quadrant discomfort at other times. Denies nausea, vomiting , diarrhea. Constipation is well managed with MiraLax.    Allergies  Allergen Reactions  . Codeine     Extreme lethargy  . Iron     By infusion  . Morphine   . Sulfa Antibiotics   . Toviaz [Fesoterodine Fumarate]    MEDICATION - Reviewed  Data Reviewed    [facility]ZCHY[facility], external  08/07/12 WBC 7.5, Hgb 11.7, Hct 35.1, Plt 167   Glu 97, BUN 25, Cr. 1.37, Na 143, K+ 4.2   TC 226, TG 79, HDL 62, LDL 143     12/06/12  total cholesterol 198, triglyceride 88, HDL 57, LDL 147.  01/01/2013 TSH 3.365     Lab Results  Component  Value Date   WBC 7.1 10/15/2013   HGB 11.0* 10/15/2013   HCT 33* 10/15/2013   PLT 171 10/15/2013   Lab Results  Component Value Date   NA 145 10/15/2013   K 3.6 10/15/2013   GLU 87 10/15/2013   BUN 24* 10/15/2013   CREATININE 1.6* 10/15/2013   Albumin 3.5    Mg 2.1    Vit D 56     Lab Results  Component Value Date   INR 2.2* 11/13/2013   INR 2.4* 10/15/2013   INR 2.5* 09/18/2013     REVIEW OF SYSTEMS DATA OBTAINED: from patient, GENERAL: Feels well  No fevers, fatigue, change in activity status, appetite, or weight  SKIN: Rash on nose RESPIRATORY: Morning cough, No wheezing, SOB. Carries compact O2 concentrator CARDIAC: No chest pain, palpitations. No edema GI: Abdominal discomfort- See HPI   No N/V/D or constipation  No heartburn or reflux  MUSCULOSKELETAL: No joint pain, swelling or stiffness No back pain  Mild right upper arm discomfort, mild left knee discomfort,(not bad enough to take Tylenol) no weakness   Gait is steady   No recent falls  NEUROLOGIC: No dizziness, fainting, headache No change in mental status  PSYCHIATRIC: No feelings of anxiety, depression  Sleeps well    Physical Exam Filed Vitals:   11/13/13 1003  BP: 144/70  Pulse: 68  Weight: 228 lb (103.42 kg)  SpO2: 99%   Body mass index is 40.4  kg/(m^2).  GENERAL APPEARANCE: No acute distress, appropriately groomed, Obese body habitus Alert, pleasant, conversant. SKIN: No diaphoresis, rash, HEAD: Normocephalic, atraumatic EYES: Conjunctiva/lids clear  RESPIRATORY: Breathing is even, unlabored Lung sounds are decreased on rt (not new), clear and full on left CARDIOVASCULAR: Heart RRR   No murmur or extra heart sounds   EDEMA: No peripheral edema   GASTROINTESTINAL: Abdomen is soft, not distended w/ normal bowel sounds. No hepatic or splenic enlargement. Tender proximal/ medial to the left iliac crest, appears to have some subcutaneous fullness in this area as well.  MUSCULOSKELETAL.  Gait is  steady PSYCHIATRIC: Mood and affect appropriate to situation   ASSESSMENT/PLAN  Atrial fibrillation Heart remains in a regular rhythm, rate well controlled, continue current medications. Recent lab studies satisfactory  Anemia, iron deficiency Most recent CBC with mild drop in hemoglobin and hematocrit. No signs of bleeding, no symptoms. Continue to monitor at intervals  Long term current use of anticoagulant INR remains in therapeutic range, continue current warfarin dose. Repeat INR in 4 weeks  Abdominal pain, other specified site Patient with right upper quadrant abdominal discomfort, she describes it as "kinking" after meals. Also experiences pain and tenderness left lower quadrant and iliac crest area. We'll obtain plain x-ray today. Patient does have a history of a Tummy tuck surgery, could account for the fullness in the subcutaneous area. Patient also reports that colonoscopy results included a long and tortuous colon.   Test ordered: Abdominal Xray  Follow up: next clinic visit 4 weeks , INR  Lamiyah Schlotter T.Harun Brumley, NP-C 11/13/2013

## 2013-11-14 ENCOUNTER — Ambulatory Visit
Admission: RE | Admit: 2013-11-14 | Discharge: 2013-11-14 | Disposition: A | Discharge status: Home / Self Care | Payer: Medicare Other | Source: Ambulatory Visit | Attending: Geriatric Medicine | Admitting: Geriatric Medicine

## 2013-11-14 DIAGNOSIS — R109 Unspecified abdominal pain: Secondary | ICD-10-CM

## 2013-11-14 DIAGNOSIS — K6389 Other specified diseases of intestine: Secondary | ICD-10-CM | POA: Diagnosis not present

## 2013-11-21 ENCOUNTER — Encounter: Payer: Self-pay | Admitting: Geriatric Medicine

## 2013-12-11 ENCOUNTER — Encounter: Payer: Self-pay | Admitting: Geriatric Medicine

## 2013-12-11 ENCOUNTER — Non-Acute Institutional Stay: Payer: Medicare Other | Admitting: Geriatric Medicine

## 2013-12-11 VITALS — BP 134/66 | HR 74 | Wt 227.0 lb

## 2013-12-11 DIAGNOSIS — I4891 Unspecified atrial fibrillation: Secondary | ICD-10-CM

## 2013-12-11 DIAGNOSIS — R109 Unspecified abdominal pain: Secondary | ICD-10-CM

## 2013-12-11 DIAGNOSIS — Z7901 Long term (current) use of anticoagulants: Secondary | ICD-10-CM

## 2013-12-11 LAB — POCT INR: INR: 2.6 — AB (ref 0.9–1.1)

## 2013-12-11 NOTE — Assessment & Plan Note (Signed)
Heart remains in regular rhythm, continue current medications including anticoagulation

## 2013-12-11 NOTE — Assessment & Plan Note (Signed)
INR remains in therapeutic range, continue current warfarin dose. Next INR 5 weeks

## 2013-12-11 NOTE — Progress Notes (Signed)
Patient ID: Cindy Cervantes, female   DOB: [birth year], 80 y.o.   MRN: [id number]  [facility] [phone])  Code Status: Full Code  Contact Information   Name Relation Home Work [place] Daughter [phone]        Chief Complaint  Patient presents with  . Medical Managment of Chronic Issues    coag check    HPI: This is a 80 y.o. female resident of [place], [place] evaluated today for coagulation management.    Last visit: Atrial fibrillation Heart remains in a regular rhythm, rate well controlled, continue current medications. Recent lab studies satisfactory  Anemia, iron deficiency Most recent CBC with mild drop in hemoglobin and hematocrit. No signs of bleeding, no symptoms. Continue to monitor at intervals  Long term current use of anticoagulant INR remains in therapeutic range, continue current warfarin dose. Repeat INR in 4 weeks  Abdominal pain, other specified site Patient with right upper quadrant abdominal discomfort, she describes it as "kinking" after meals. Also experiences pain and tenderness left lower quadrant and iliac crest area. We'll obtain plain x-ray today. Patient does have a history of a Tummy tuck surgery, could account for the fullness in the subcutaneous area. Patient also reports that colonoscopy results included a long and tortuous colon.  Since last visit, patient did have an abdominal x-ray no significant abnormal findings. Abdominal discomfort "comes and goes". Patient continues to have regular bowel movements with use of MiraLax, no nausea or vomiting. Her appetite remains very good. Patient continues to take warfarin as prescribed, no new medications. She has been without her vegetable shake for the last few days.    Allergies  Allergen Reactions  . Codeine     Extreme lethargy  . Iron     By infusion  . Morphine   . Sulfa Antibiotics   . Toviaz  [Fesoterodine Fumarate]    MEDICATION - Reviewed  DATA REVIEWED  Radiologic Exams 11/14/2013 ABDOMEN - 2 VIEW   COMPARISON: None.  FINDINGS:  Scattered air and stool in the colon along with scattered air-filled small bowel loops. Mild fluid and air distended stomach. There also  some right upper quadrant small bowel loops that are mildly distended. Could not exclude an early small bowel obstruction or  right upper quadrant inflammatory process with focal ileus. No free air. The soft tissue shadows are maintained.    Laboratory Studies  [facility], external    12/06/12  total cholesterol 198, triglyceride 88, HDL 57, LDL 147.  01/01/2013 TSH 3.365     Lab Results  Component Value Date   WBC 7.1 10/15/2013   HGB 11.0* 10/15/2013   HCT 33* 10/15/2013   PLT 171 10/15/2013   Lab Results  Component Value Date   NA 145 10/15/2013   K 3.6 10/15/2013   GLU 87 10/15/2013   BUN 24* 10/15/2013   CREATININE 1.6* 10/15/2013   Albumin 3.5    Mg 2.1    Vit D 56     POCT [facility] Lab Results  Component Value Date   INR 2.6* 12/11/2013   INR 2.2* 11/13/2013   INR 2.4* 10/15/2013     REVIEW OF SYSTEMS DATA OBTAINED: from patient, GENERAL: Feels well, a bit tired today.  No fevers, change in activity status, appetite, or weight  SKIN: Rash on nose RESPIRATORY: Morning cough, No wheezing, SOB. Carries compact O2 concentrator CARDIAC: No chest pain, palpitations. No edema GI: Less abdominal discomfort-  See HPI   No N/V/D or constipation  No heartburn or reflux  MUSCULOSKELETAL: No joint pain, swelling or stiffness No back pain  Mild right upper arm discomfort, mild left knee discomfort,(not bad enough to take Tylenol) no weakness   Gait is steady   No recent falls  NEUROLOGIC: No dizziness, fainting, headache No change in mental status  PSYCHIATRIC: No feelings of anxiety, depression  Sleeps well    PHYSICAL EXAM  Filed Vitals:   12/11/13 1404  BP: 134/66  Pulse: 74  Weight: 227 lb  (102.967 kg)  SpO2: 99%   Body mass index is 40.22 kg/(m^2).  GENERAL APPEARANCE: No acute distress, appropriately groomed, Obese body habitus Alert, pleasant, conversant. SKIN: No diaphoresis, rash, HEAD: Normocephalic, atraumatic EYES: Conjunctiva/lids clear  RESPIRATORY: Breathing is even, unlabored Lung sounds are decreased on rt (not new), clear and full on left CARDIOVASCULAR: Heart RRR   No murmur or extra heart sounds   EDEMA: No peripheral edema   MUSCULOSKELETAL.  Gait is steady PSYCHIATRIC: Mood and affect appropriate to situation   ASSESSMENT/PLAN  Atrial fibrillation Heart remains in regular rhythm, continue current medications including anticoagulation  Long term current use of anticoagulant INR remains in therapeutic range, continue current warfarin dose. Next INR 5 weeks  Abdominal pain, other specified site Intermittent upper abdominal discomfort likely from trapped gas and dilated loops of bowel. No signs of acute illness. Have recommended she continue daily activity with walking. Also add some stretching to help promote intestinal motility.   TLab/test ordered:  Follow up:  Return in about 5 weeks (around 01/15/2014) for INR.   Mardene Celeste, NP-C [place] [phone]  12/11/2013

## 2013-12-11 NOTE — Assessment & Plan Note (Signed)
Intermittent upper abdominal discomfort likely from trapped gas and dilated loops of bowel. No signs of acute illness. Have recommended she continue daily activity with walking. Also add some stretching to help promote intestinal motility.

## 2014-01-15 ENCOUNTER — Encounter: Payer: Self-pay | Admitting: Geriatric Medicine

## 2014-01-15 ENCOUNTER — Non-Acute Institutional Stay: Payer: Medicare Other | Admitting: Geriatric Medicine

## 2014-01-15 VITALS — BP 148/72 | HR 68 | Wt 225.0 lb

## 2014-01-15 DIAGNOSIS — F3289 Other specified depressive episodes: Secondary | ICD-10-CM | POA: Diagnosis not present

## 2014-01-15 DIAGNOSIS — I4891 Unspecified atrial fibrillation: Secondary | ICD-10-CM | POA: Diagnosis not present

## 2014-01-15 DIAGNOSIS — F329 Major depressive disorder, single episode, unspecified: Secondary | ICD-10-CM | POA: Diagnosis not present

## 2014-01-15 DIAGNOSIS — I10 Essential (primary) hypertension: Secondary | ICD-10-CM | POA: Diagnosis not present

## 2014-01-15 DIAGNOSIS — Z0000 Encounter for general adult medical examination without abnormal findings: Secondary | ICD-10-CM | POA: Diagnosis not present

## 2014-01-15 DIAGNOSIS — E785 Hyperlipidemia, unspecified: Secondary | ICD-10-CM | POA: Diagnosis not present

## 2014-01-15 DIAGNOSIS — Z79899 Other long term (current) drug therapy: Secondary | ICD-10-CM | POA: Diagnosis not present

## 2014-01-15 DIAGNOSIS — Z7901 Long term (current) use of anticoagulants: Secondary | ICD-10-CM

## 2014-01-15 LAB — POCT INR: INR: 2.6 — AB (ref 0.9–1.1)

## 2014-01-15 NOTE — Assessment & Plan Note (Signed)
Heart in regular rhythm, rate well controlled. Continue current medications including anticoagulation

## 2014-01-15 NOTE — Progress Notes (Signed)
Patient ID: Cindy Cervantes, female   DOB: Dec 02, 1931, 80 y.o.   MRN: [id number]  [facility] [phone])  Code Status: Full Code  Contact Information   Name Relation Home Work [place] Daughter [phone]        Chief Complaint  Patient presents with  . Medical Managment of Chronic Issues    coag check     HPI: This is a 80 y.o. female resident of [place], [place] evaluated today for coagulation management.    Last visit: Atrial fibrillation Heart remains in regular rhythm, continue current medications including anticoagulation  Long term current use of anticoagulant INR remains in therapeutic range, continue current warfarin dose. Next INR 5 weeks  Abdominal pain, other specified site Intermittent upper abdominal discomfort likely from trapped gas and dilated loops of bowel. No signs of acute illness. Have recommended she continue daily activity with walking. Also add some stretching to help promote intestinal motility.  Since last visit no acute medical issues. Patient's been taking warfarin as instructed, no new medications or diet changes.  Allergies  Allergen Reactions  . Codeine     Extreme lethargy  . Iron     By infusion  . Morphine   . Sulfa Antibiotics   . Toviaz [Fesoterodine Fumarate]    MEDICATION - Reviewed  DATA REVIEWED  Radiologic Exams 11/14/2013 ABDOMEN - 2 VIEW   COMPARISON: None.  FINDINGS:  Scattered air and stool in the colon along with scattered air-filled small bowel loops. Mild fluid and air distended stomach. There also  some right upper quadrant small bowel loops that are mildly distended. Could not exclude an early small bowel obstruction or  right upper quadrant inflammatory process with focal ileus. No free air. The soft tissue shadows are maintained.    Laboratory Studies  [facility], external    12/06/12  total cholesterol 198, triglyceride 88, HDL  57, LDL 147.  01/01/2013 TSH 3.365     Lab Results  Component Value Date   WBC 7.1 10/15/2013   HGB 11.0* 10/15/2013   HCT 33* 10/15/2013   PLT 171 10/15/2013   Lab Results  Component Value Date   NA 145 10/15/2013   K 3.6 10/15/2013   GLU 87 10/15/2013   BUN 24* 10/15/2013   CREATININE 1.6* 10/15/2013   Albumin 3.5    Mg 2.1    Vit D 56     POCT [facility] Lab Results  Component Value Date   INR 2.6* 01/15/2014   INR 2.6* 12/11/2013   INR 2.2* 11/13/2013     REVIEW OF SYSTEMS DATA OBTAINED: from patient, GENERAL: Feels well.  No fevers, change in activity status, appetite, or weight  SKIN: Rash on nose RESPIRATORY: Morning cough, runny nose No wheezing, SOB. Carries compact O2 concentrator CARDIAC: No chest pain, palpitations. No edema GI:   No N/V/D or constipation  No heartburn or reflux  NEUROLOGIC: No dizziness, fainting, headache No change in mental status  PSYCHIATRIC: No feelings of anxiety, depression  Sleeps well    PHYSICAL EXAM  Filed Vitals:   01/15/14 1306  BP: 148/72  Pulse: 68  Weight: 225 lb (102.059 kg)  SpO2: 96%   Body mass index is 39.87 kg/(m^2).  GENERAL APPEARANCE: No acute distress, appropriately groomed, Obese body habitus Alert, pleasant, conversant. SKIN: No diaphoresis, rash, HEAD: Normocephalic, atraumatic EYES: Conjunctiva/lids clear  RESPIRATORY: Breathing is even, unlabored Lung sounds are decreased on rt (not new), clear  and full on left CARDIOVASCULAR: Heart RRR   No murmur or extra heart sounds   EDEMA: No peripheral edema   MUSCULOSKELETAL.  Gait is steady PSYCHIATRIC: Mood and affect appropriate to situation   ASSESSMENT/PLAN  Atrial fibrillation Heart in regular rhythm, rate well controlled. Continue current medications including anticoagulation  Health care maintenance Patient agreed to undergo pharmacogenetic testing today. Once results are received, will contact patient if medication adjustments are  necessary    Long term current use of anticoagulant INR remains in therapeutic range, continue current warfarin dose. Repeat INR in 5 weeks   Lab/test ordered:  Follow up:  Return in about 5 weeks (around 02/19/2014) for INR.   Mardene Celeste, NP-C [place] [phone]  01/15/2014

## 2014-01-15 NOTE — Assessment & Plan Note (Signed)
Patient agreed to undergo pharmacogenetic testing today. Once results are received, will contact patient if medication adjustments are necessary   

## 2014-01-15 NOTE — Assessment & Plan Note (Signed)
INR remains in therapeutic range, continue current warfarin dose. Repeat INR in 5 weeks 

## 2014-02-03 ENCOUNTER — Encounter: Payer: Self-pay | Admitting: Internal Medicine

## 2014-02-03 ENCOUNTER — Ambulatory Visit (INDEPENDENT_AMBULATORY_CARE_PROVIDER_SITE_OTHER): Payer: Medicare Other | Admitting: *Deleted

## 2014-02-03 DIAGNOSIS — I495 Sick sinus syndrome: Secondary | ICD-10-CM | POA: Diagnosis not present

## 2014-02-08 LAB — MDC_IDC_ENUM_SESS_TYPE_REMOTE
Battery Remaining Longevity: 93 mo
Battery Voltage: 2.95 V
Brady Statistic AP VP Percent: 1 %
Brady Statistic RV Percent Paced: 1.1 %
Implantable Pulse Generator Model: 2210
Lead Channel Impedance Value: 450 Ohm
Lead Channel Pacing Threshold Amplitude: 0.375 V
Lead Channel Pacing Threshold Amplitude: 1 V
Lead Channel Pacing Threshold Pulse Width: 0.5 ms
Lead Channel Setting Pacing Amplitude: 1.375
Lead Channel Setting Pacing Amplitude: 2.5 V
MDC IDC MSMT LEADCHNL RA SENSING INTR AMPL: 0.9 mV
MDC IDC MSMT LEADCHNL RV IMPEDANCE VALUE: 750 Ohm
MDC IDC MSMT LEADCHNL RV PACING THRESHOLD PULSEWIDTH: 0.5 ms
MDC IDC MSMT LEADCHNL RV SENSING INTR AMPL: 12 mV
MDC IDC PG SERIAL: 7198677
MDC IDC SESS DTM: 20150504075631
MDC IDC SET LEADCHNL RV PACING PULSEWIDTH: 0.5 ms
MDC IDC SET LEADCHNL RV SENSING SENSITIVITY: 2 mV
MDC IDC STAT BRADY AP VS PERCENT: 50 %
MDC IDC STAT BRADY AS VP PERCENT: 1 %
MDC IDC STAT BRADY AS VS PERCENT: 49 %
MDC IDC STAT BRADY RA PERCENT PACED: 51 %

## 2014-02-12 LAB — POCT INR: INR: 2.6 — AB (ref 0.9–1.1)

## 2014-02-14 NOTE — Progress Notes (Signed)
Remote pacemaker transmission.   

## 2014-02-18 DIAGNOSIS — H52229 Regular astigmatism, unspecified eye: Secondary | ICD-10-CM | POA: Diagnosis not present

## 2014-02-18 DIAGNOSIS — H35039 Hypertensive retinopathy, unspecified eye: Secondary | ICD-10-CM | POA: Diagnosis not present

## 2014-02-18 DIAGNOSIS — H524 Presbyopia: Secondary | ICD-10-CM | POA: Diagnosis not present

## 2014-02-18 DIAGNOSIS — H5200 Hypermetropia, unspecified eye: Secondary | ICD-10-CM | POA: Diagnosis not present

## 2014-02-19 ENCOUNTER — Non-Acute Institutional Stay: Payer: Medicare Other | Admitting: Geriatric Medicine

## 2014-02-19 ENCOUNTER — Encounter: Payer: Self-pay | Admitting: Geriatric Medicine

## 2014-02-19 VITALS — BP 140/66 | HR 88 | Wt 226.0 lb

## 2014-02-19 DIAGNOSIS — Z7901 Long term (current) use of anticoagulants: Secondary | ICD-10-CM

## 2014-02-19 DIAGNOSIS — I4891 Unspecified atrial fibrillation: Secondary | ICD-10-CM

## 2014-02-19 LAB — POCT INR: INR: 2.6 — AB (ref 0.9–1.1)

## 2014-02-19 NOTE — Assessment & Plan Note (Signed)
Heart remains in regular rhythm, continue current medication including anticoagulation

## 2014-02-19 NOTE — Assessment & Plan Note (Signed)
INR remains in therapeutic range, continue current warfarin dose. Repeat INR in 5 weeks

## 2014-02-19 NOTE — Progress Notes (Signed)
Patient ID: Cindy Cervantes, female   DOB: [birth year], 80 y.o.   MRN: [id number]  [facility] [phone])  Code Status: Full Code  Contact Information   Name Relation Home Work [place] Daughter [phone]        Chief Complaint  Patient presents with  . Medical Management of Chronic Issues    coag check    HPI: This is a 80 y.o. female resident of [place], [place] evaluated today for coagulation management.    Last visit: Atrial fibrillation Heart in regular rhythm, rate well controlled. Continue current medications including anticoagulation Health care maintenance Patient agreed to undergo pharmacogenetic testing today. Once results are received, will contact patient if medication adjustments are necessary Long term current use of anticoagulant INR remains in therapeutic range, continue current warfarin dose. Repeat INR in 5 weeks  Since last visit patient has continued taking warfarin as prescribed, no new medications. She has noted an apparent intolerance to lactose, has stopped eating ice cream and other dairy products. Notes that her GI symptoms have improved. The patient asks if she can stop taking calcium supplement, she continues to struggle with constipation despite daily use of MiraLax.    Allergies  Allergen Reactions  . Codeine     Extreme lethargy  . Iron     By infusion  . Morphine   . Sulfa Antibiotics   . Toviaz [Fesoterodine Fumarate]    MEDICATION - Reviewed  DATA REVIEWED  Radiologic Exams 11/14/2013 ABDOMEN - 2 VIEW   COMPARISON: None.  FINDINGS:  Scattered air and stool in the colon along with scattered air-filled small bowel loops. Mild fluid and air distended stomach. There also  some right upper quadrant small bowel loops that are mildly distended. Could not exclude an early small bowel obstruction or  right upper quadrant inflammatory process with focal ileus.  No free air. The soft tissue shadows are maintained.    Laboratory Studies  [facility], external    12/06/12  total cholesterol 198, triglyceride 88, HDL 57, LDL 147.  01/01/2013 TSH 3.365     Lab Results  Component Value Date   WBC 7.1 10/15/2013   HGB 11.0* 10/15/2013   HCT 33* 10/15/2013   PLT 171 10/15/2013   Lab Results  Component Value Date   NA 145 10/15/2013   K 3.6 10/15/2013   GLU 87 10/15/2013   BUN 24* 10/15/2013   CREATININE 1.6* 10/15/2013   Albumin 3.5    Mg 2.1    Vit D 56     POCT [facility] Lab Results  Component Value Date   INR 2.6* 02/19/2014   INR 2.6* 02/12/2014   INR 2.6* 01/15/2014     REVIEW OF SYSTEMS DATA OBTAINED: from patient, GENERAL: Feels well.  No fevers, change in activity status, appetite, or weight  SKIN: Rash on nose RESPIRATORY: Morning cough, runny nose No wheezing, SOB. Carries compact O2 concentrator CARDIAC: No chest pain, palpitations. No edema GI:   No N/V/D or constipation  No heartburn or reflux  NEUROLOGIC: No dizziness, fainting, headache No change in mental status  PSYCHIATRIC: No feelings of anxiety, depression  Sleeps well    PHYSICAL EXAM  Filed Vitals:   02/19/14 1305  BP: 140/66  Pulse: 88  Weight: 226 lb (102.513 kg)  SpO2: 96%   Body mass index is 40.04 kg/(m^2).  GENERAL APPEARANCE: No acute distress, appropriately groomed, Obese body habitus Alert, pleasant, conversant. SKIN: No  diaphoresis, rash, HEAD: Normocephalic, atraumatic EYES: Conjunctiva/lids clear  RESPIRATORY: Breathing is even, unlabored Lung sounds are decreased on rt (not new), clear and full on left CARDIOVASCULAR: Heart RRR   No murmur or extra heart sounds   EDEMA: No peripheral edema   MUSCULOSKELETAL.  Gait is steady PSYCHIATRIC: Mood and affect appropriate to situation   ASSESSMENT/PLAN  Atrial fibrillation Heart remains in regular rhythm, continue current medication including anticoagulation  Long term current use of  anticoagulant INR remains in therapeutic range, continue current warfarin dose. Repeat INR in 5 weeks   Lab/test ordered:  Follow up:  Return in about 5 weeks (around 03/26/2014) for INR.   Mardene Celeste, NP-C [place] [phone]  02/19/2014

## 2014-02-20 ENCOUNTER — Encounter: Payer: Self-pay | Admitting: Pulmonary Disease

## 2014-02-20 ENCOUNTER — Ambulatory Visit (INDEPENDENT_AMBULATORY_CARE_PROVIDER_SITE_OTHER): Payer: Medicare Other | Admitting: Pulmonary Disease

## 2014-02-20 VITALS — BP 142/68 | HR 71 | Temp 98.2°F | Ht 63.0 in | Wt 227.6 lb

## 2014-02-20 DIAGNOSIS — G4733 Obstructive sleep apnea (adult) (pediatric): Secondary | ICD-10-CM | POA: Diagnosis not present

## 2014-02-20 NOTE — Patient Instructions (Signed)
Follow up in 1 year.

## 2014-02-20 NOTE — Progress Notes (Signed)
Chief Complaint  Patient presents with  . Follow-up    Wears CPAP nightly x 7hr. Denies problems with mask/pressure.     History of Present Illness: Cindy Cervantes is a 80 y.o. female former smoker with cough 2nd to GERD, OSA/OHS, tracheobronchomalacia, and Rt hemidiaphragm elevation.  She was told she has rosacea, and this is causing redness on her nose.  She does not have any issue with her BiPAP mask otherwise.  She is otherwise doing well with her breathing.  Tests: PFT's March 29 2010 >> FEV1 1.34 (72) with ratio 75 and ERV 42, DLC0 55 > corrects to 137 CT chest 10/17/10>>GGO LUL, no PE, tracheobronchomalacia involving the visualized trachea with areas of air trapping seen throughout both lungs PSG 12/14/10>>AHI 17.9, REM 40.8 BPAP titraton 01/12/11>>18/13 cm H2O with 3 liters oxygen. BPAP 02/01/11 to 03/04/12>>Used on 386 of 398 nights with average 7 hrs 33 min.  Average AHI 2.7 with BPAP 16/13 cm H2O.  Minimal airleak.  Tv 460, Ve 6.1, RR 15. 03/12/12 Change to BiPAP 15/12 cm H2O 03/05/13 Change BiPAP to 14/10 cm H2O   She  has a past medical history of Paroxysmal a-fib (with RVR); History of hyperkalemia (in setting of spironolactone 09/2010); Sinus node dysfunction; Pacemaker; Obesity; Allergic rhinitis; Hypertension; Senile cataract; Hyperlipidemia; Xerostomia; Dysphagia; Cough; OSA (obstructive sleep apnea); Diverticulosis of colon; Obesity hypoventilation syndrome; Colon polyp; Tracheobronchomalacia; Diaphragm dysfunction; Anemia of renal disease (10/21/2011); Anemia, iron deficiency (10/21/2011); MDS (myelodysplastic syndrome), low grade (10/21/2011); Unspecified vitamin D deficiency; Dehydration; Hypopotassemia; Other specified disease of white blood cells; Chronic diastolic heart failure; Reflux esophagitis; Cholelithiasis; Osteoarthrosis, unspecified whether generalized or localized, unspecified site; Disorder of bone and cartilage, unspecified; Urinary incontinence; Debility,  unspecified; Long term (current) use of anticoagulants; Hypothyroidism (02/08/2013); GERD (gastroesophageal reflux disease) (02/08/2013); Depression; Constipation; and Rosacea (05/15/2013).  She  has past surgical history that includes Tubal ligation (1964); Total knee arthroplasty (1999); Tummy tuck (2000); Total knee arthroplasty (2003); Cataract extraction (2008); and pacemaker placement (10/21/2010).  Current Outpatient Prescriptions on File Prior to Visit  Medication Sig Dispense Refill  . acetaminophen (TYLENOL) 325 MG tablet Take 650 mg by mouth every 4 (four) hours as needed.       . Aloe-Sodium Chloride (AYR SALINE NASAL GEL NA) Place into the nose. Apply topically to each nostril four times daily as needed for soreness and dryness      . amiodarone (PACERONE) 200 MG tablet Take 100 mg by mouth daily.       Marland Kitchen ezetimibe (ZETIA) 10 MG tablet Take 10 mg by mouth daily.      . fluticasone (FLONASE) 50 MCG/ACT nasal spray Place 2 sprays into the nose 2 (two) times daily. 2 sprays in each nostril      . hydrocortisone cream 1 % Apply topically as needed.      Marland Kitchen levothyroxine (SYNTHROID, LEVOTHROID) 25 MCG tablet Take 50 mcg by mouth daily.       . Magnesium 400 MG CAPS Take by mouth. Take one tablet daily      . metroNIDAZOLE (METROGEL) 1 % gel Apply topically. Apply to rash on nose once daily      . niacin (NIASPAN) 500 MG CR tablet Take 1,000 mg by mouth at bedtime.       . NON FORMULARY 1.5-2 L/min. Oxygen      . polyethylene glycol powder (MIRALAX) powder Take 17 g by mouth daily. As directed      . potassium chloride SA (K-DUR,KLOR-CON) 20  MEQ tablet Take 20 mEq by mouth daily.       Marland Kitchen torsemide (DEMADEX) 20 MG tablet Take 20 mg by mouth daily. Take 60mg  daily      . triamcinolone (NASACORT AQ) 55 MCG/ACT nasal inhaler Place 2 sprays into the nose daily.  1 Inhaler  12  . warfarin (COUMADIN) 4 MG tablet Take 4 mg by mouth daily at 6 PM. Take once at; 4:00 pm - 6:00 pm. Daily       No current  facility-administered medications on file prior to visit.    Allergies  Allergen Reactions  . Codeine     Extreme lethargy  . Iron     By infusion  . Morphine   . Sulfa Antibiotics   . Toviaz [Fesoterodine Fumarate]     Physical Exam:  General - No distress, wearing oxygen ENT - No sinus tenderness, clear nasal drainage, no oral exudate, no LAN Cardiac - s1s2 regular, no murmur Chest - decreased breath sounds Rt base, no wheeze/rales Back - No focal tenderness Abd - Soft, non-tender Ext - No edema Neuro - Normal strength Skin - mild redness over malar region Psych - Normal mood, and behavior   Assessment/Plan:  Chesley Mires, MD [facility] Pager:  [phone] 02/20/2014, 3:44 PM

## 2014-02-28 ENCOUNTER — Encounter: Payer: Self-pay | Admitting: Cardiology

## 2014-03-03 ENCOUNTER — Encounter: Payer: Self-pay | Admitting: Internal Medicine

## 2014-03-03 ENCOUNTER — Non-Acute Institutional Stay: Payer: Medicare Other | Admitting: Internal Medicine

## 2014-03-03 VITALS — BP 160/72 | HR 68 | Temp 97.9°F | Resp 14 | Ht 63.0 in | Wt 227.0 lb

## 2014-03-03 DIAGNOSIS — Z7901 Long term (current) use of anticoagulants: Secondary | ICD-10-CM | POA: Diagnosis not present

## 2014-03-03 DIAGNOSIS — R05 Cough: Secondary | ICD-10-CM

## 2014-03-03 DIAGNOSIS — G252 Other specified forms of tremor: Secondary | ICD-10-CM

## 2014-03-03 DIAGNOSIS — Z950 Presence of cardiac pacemaker: Secondary | ICD-10-CM | POA: Diagnosis not present

## 2014-03-03 DIAGNOSIS — I5032 Chronic diastolic (congestive) heart failure: Secondary | ICD-10-CM

## 2014-03-03 DIAGNOSIS — E785 Hyperlipidemia, unspecified: Secondary | ICD-10-CM

## 2014-03-03 DIAGNOSIS — E039 Hypothyroidism, unspecified: Secondary | ICD-10-CM | POA: Diagnosis not present

## 2014-03-03 DIAGNOSIS — D4620 Refractory anemia with excess of blasts, unspecified: Secondary | ICD-10-CM | POA: Diagnosis not present

## 2014-03-03 DIAGNOSIS — R059 Cough, unspecified: Secondary | ICD-10-CM

## 2014-03-03 DIAGNOSIS — K5900 Constipation, unspecified: Secondary | ICD-10-CM

## 2014-03-03 DIAGNOSIS — I10 Essential (primary) hypertension: Secondary | ICD-10-CM

## 2014-03-03 DIAGNOSIS — G4733 Obstructive sleep apnea (adult) (pediatric): Secondary | ICD-10-CM

## 2014-03-03 DIAGNOSIS — D46Z Other myelodysplastic syndromes: Secondary | ICD-10-CM

## 2014-03-03 DIAGNOSIS — F3289 Other specified depressive episodes: Secondary | ICD-10-CM

## 2014-03-03 DIAGNOSIS — R0602 Shortness of breath: Secondary | ICD-10-CM

## 2014-03-03 DIAGNOSIS — R109 Unspecified abdominal pain: Secondary | ICD-10-CM

## 2014-03-03 DIAGNOSIS — F329 Major depressive disorder, single episode, unspecified: Secondary | ICD-10-CM

## 2014-03-03 DIAGNOSIS — R609 Edema, unspecified: Secondary | ICD-10-CM

## 2014-03-03 DIAGNOSIS — G250 Essential tremor: Secondary | ICD-10-CM

## 2014-03-03 DIAGNOSIS — I4891 Unspecified atrial fibrillation: Secondary | ICD-10-CM

## 2014-03-03 NOTE — Progress Notes (Signed)
Patient ID: Cindy Cervantes, female   DOB: 12-25-1931, 80 y.o.   MRN: [id number]    Location:  [place] of Service: Clinic (12)  PCP: Estill Dooms, MD  Code Status: LIVING WILL, HCPOA  Extended Emergency Contact Information Primary Emergency Contact: Gooding,Leslie Address: [place], [place] Phone: [phone] Relation: Daughter  Allergies  Allergen Reactions   Codeine     Extreme lethargy   Iron     By infusion   Morphine    Sulfa Antibiotics    Toviaz [Fesoterodine Fumarate]     Chief Complaint  Patient presents with   Annual Exam    Anemia, CK-MB, chronic diastolic heart failure, GERD, hyperlipidemia, hypertension, hypothyroid    HPI:  Long term current use of anticoagulant: For atrial fibrillation. Patient in the therapeutic range.  MDS (myelodysplastic syndrome), low grade: Followed by Dr. Marin Olp; stable.  Pacemaker-St.Jude:functioning well  Unspecified hypothyroidism: compensated  Abdominal pain, other specified site: Resolved  Atrial fibrillation: Rate controlled  Chronic diastolic heart failure: Compensated  Constipation: Unchanged  Cough: Mild & chronic.  Edema: 1-2+ bipedal.  Depressive disorder, not elsewhere classified: In remission  DYSPNEA: Exertional dyspnea. Chronic. Stable.  HYPERLIPIDEMIA: Controlled  HYPERTENSION: Controlled  OBSTRUCTIVE SLEEP APNEA: Continues to wear a mask  Tremor, essential: Unchanged      Past Medical History  Diagnosis Date   Paroxysmal a-fib with RVR    Amiodarone   History of hyperkalemia in setting of spironolactone 09/2010   Sinus node dysfunction    Pacemaker     Implanted December 2012   Obesity    Allergic rhinitis    Hypertension    Senile cataract    Hyperlipidemia    Xerostomia    Dysphagia    Cough     2nd to reflux   OSA (obstructive sleep apnea)    Diverticulosis of  colon    Obesity hypoventilation syndrome    Colon polyp    Tracheobronchomalacia    Diaphragm dysfunction     Right hemidiaphragm   Anemia of renal disease 10/21/2011   Anemia, iron deficiency 10/21/2011   MDS (myelodysplastic syndrome), low grade 10/21/2011   Unspecified vitamin D deficiency    Dehydration    Hypopotassemia    Other specified disease of white blood cells    Chronic diastolic heart failure    Reflux esophagitis    Cholelithiasis    Osteoarthrosis, unspecified whether generalized or localized, unspecified site    Disorder of bone and cartilage, unspecified    Urinary incontinence    Debility, unspecified    Long term (current) use of anticoagulants    Hypothyroidism 02/08/2013   GERD (gastroesophageal reflux disease) 02/08/2013   Depression    Constipation    Rosacea 05/15/2013    Past Surgical History  Procedure Laterality Date   Tubal ligation  21-Jan-1963   Total knee arthroplasty  1999    right knee   Tummy tuck  1999-01-21    pt "almost died"; respiratory distress, became delrious   Total knee arthroplasty  01-20-02    left knee   Cataract extraction  21-Jan-2007   Pacemaker placement  10/21/2010    dural chamber Dr. [name]   PROCEDURES Echocardiogram Cardiac cath Bronchoscopy Chest X-Ray  Bone density - no osteoporosis Colonoscopy- last one >5 years ago 01-21-08 - mammogram Normal  12/16/2009 Colonoscopy: Incomplete exam Diverticulosis, mild, left sided diverticulosis  exam to transverse colon (100 cm) redundant colon. Dr Delfin Edis  03/09/2010-CT Chest: No findings to explain the patient's given symptoms. Left hepatic lobe atrophy with areas of markedly decreased attenuation in the residual liver. While this may represent a non typical fat deposition, further evaluation with MR abdomen with and without contrast is recommended as malignancy cannot be excluded. Cholelithiasis present. 06/10/10: EKG: Sinus tach, rate 114 BPM. Atrial premature complex,  incomplete RBBB 07/05/2010-MRI Abdomen: Left hepatic lobe atrophy. The questioned areas on CT within the remaining right lobe are consistent with areas of focal fat deposition. No worrisome liver liver findings. Cholelithiasis without cholecystitis. minimally complex left renal cysts. Tiny cystic focus within the pancreatic tail Most likely a pseudocyst. 08/24/10: Esophagram/ BaSwallow:  Disruption of primary peristalsis an all swallows. Significant gastroesophageal reflux of upper thoracic esophagus with prominent tertiary wave contractions. 09/17/10-Chest X-Ray: Chronic elevation of the right hemidiaphragm with right basilar plate like atelectasis. No acute cardiopulmonary disease 09/21/2010-Chest X-Ray: Question of mild pulmonary vascular congestion. Chronic elevation of the right hemidiaphragm.  09/27/2010-Chest X-Ray: Cardiomegaly again noted. Stable chronic elevation of the right hemidiaphragm with right basilar atelectasis. No active disease. 10/11/10 CXR:  10/25/10 CXR: 10/27/10 CXR: Stable cardiac enlargement/vasc. congestion w/o overt pulmonary edema. Persistent elevation of Rt. hemidiaphragm w/ overlapping vascular crowding/ atx.  CT angio chest: No evidence of PE. ound glass opacities LUL suggesting infection. Tracheobronchial malacia w/ areas of air trapping throughout both luings. Left chest wal PPM inplace.  10/28/10: Chest fluoroscopy: Moderately elevated rt. hemidiaphragm redemonstrated but excursion is relatively symmetric,( only 8% difference ). Query weakening or prior injury rather than rt.phrenic nerve dysfunction 06/08/2011 Bone Density osteopenia 06/21/2011 US Breast negative 06/21/2011 Mammogram benign 02/04/2013 mammogram: Normal 02/06/2013 bone density: Lumbar spine T score -0.4. Left femoral neck T score -1.4 11/14/2013 abdominal x-ray: Dilated right upper quadrant small bowel loops   CONSULTANTS Orthopedics: Percell Miller Ophthalmology: Janyth Contes Dermatology: [place] No GI: [place]/  D. Olevia Perches Dentist: Lavone Neri Cardiology: [facility]/ [facility]/ Aundra Dubin Pulmonary: Halford Chessman   Social History: History   Social History   Marital Status: Widowed    Spouse Name: N/A    Number of Children: N/A   Years of Education: N/A   Occupational History   retired    Social History Main Topics   Smoking status: Former Smoker -- 0.50 packs/day    Types: Cigarettes    Quit date: 10/03/1982   Smokeless tobacco: Never Used   Alcohol Use: 0.6 oz/week    1 Glasses of wine per week     Comment: 1 glass of wine every Monday & Thursday evening   Drug Use: No   Sexual Activity: No   Other Topics Concern   None   Social History Narrative   Widowed 2002 (married 1955), originally form [place]. 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 Moved from [place], [place] to [employer], [place] section 2009. Moved to Assisted Living section 2012.    Non smoker, mininmal alcohol.    Has a Living Will   Power wheelchair   Exercise none                   Family History Family Status  Relation Status Death Age   Father Deceased    Mother Deceased    Sister Alive    Brother Deceased    Daughter 73    Son Alive    Sister Alive     spinal stenosis   Son Alive    Son Black  Family History  Problem Relation Age of Onset   Heart disease Father    Heart disease Brother      Medications: Patient's Medications  New Prescriptions   No medications on file  Previous Medications   ACETAMINOPHEN (TYLENOL) 325 MG TABLET    Take 650 mg by mouth every 4 (four) hours as needed.    ALOE-SODIUM CHLORIDE (AYR SALINE NASAL GEL NA)    Place into the nose. Apply topically to each nostril four times daily as needed for soreness and dryness   AMIODARONE (PACERONE) 200 MG TABLET    Take 100 mg by mouth daily.    EZETIMIBE (ZETIA) 10 MG TABLET    Take 10 mg by mouth daily.   FLUTICASONE (FLONASE) 50 MCG/ACT NASAL SPRAY    Place 2 sprays  into the nose 2 (two) times daily. 2 sprays in each nostril   HYDROCORTISONE CREAM 1 %    Apply topically as needed.   LEVOTHYROXINE (SYNTHROID, LEVOTHROID) 25 MCG TABLET    Take 50 mcg by mouth daily.    MAGNESIUM 400 MG CAPS    Take by mouth. Take one tablet daily   METRONIDAZOLE (METROGEL) 1 % GEL    Apply topically. Apply to rash on nose once daily   NIACIN (NIASPAN) 500 MG CR TABLET    Take 1,000 mg by mouth at bedtime.    NON FORMULARY    1.5-2 L/min. Oxygen   POLYETHYLENE GLYCOL POWDER (MIRALAX) POWDER    Take 17 g by mouth daily. As directed   POTASSIUM CHLORIDE SA (K-DUR,KLOR-CON) 20 MEQ TABLET    Take 20 mEq by mouth daily.    TORSEMIDE (DEMADEX) 20 MG TABLET    Take 20 mg by mouth daily. Take 60mg  daily   TRIAMCINOLONE (NASACORT AQ) 55 MCG/ACT NASAL INHALER    Place 2 sprays into the nose daily.   WARFARIN (COUMADIN) 4 MG TABLET    Take 4 mg by mouth daily at 6 PM. Take once at; 4:00 pm - 6:00 pm. Daily  Modified Medications   No medications on file  Discontinued Medications   No medications on file    Immunization History  Administered Date(s) Administered   Influenza Split 07/08/2011   Influenza Whole 07/06/2009, 07/03/2012, 07/16/2013   PPD Test 11/02/2011   Pneumococcal Polysaccharide-23 10/27/2010     Review of Systems  Constitutional: Positive for fatigue. Negative for fever, chills, diaphoresis, activity change, appetite change and unexpected weight change.  HENT: Negative.   Eyes: Negative.   Respiratory: Positive for shortness of breath.        Oxygen dependent  Cardiovascular: Positive for palpitations and leg swelling. Negative for chest pain.       Pacemaker.  Gastrointestinal:       Previous problems with reflux are under control  Endocrine: Negative.   Genitourinary:       Urinary dribbling. Stress incontinence.  Musculoskeletal: Positive for arthralgias, gait problem and myalgias.  Skin: Negative.   Allergic/Immunologic: Negative.     Neurological: Negative.  Negative for dizziness, tremors, seizures, speech difficulty, weakness, numbness and headaches.  Hematological:       Chronic anemia.  Psychiatric/Behavioral: Negative.       Filed Vitals:   03/03/14 1513  BP: 160/72  Pulse: 68  Temp: 97.9 F (36.6 C)  TempSrc: Oral  Height: 5\' 3"  (1.6 m)  Weight: 227 lb (102.967 kg)  SpO2: 99%   Body mass index is 40.22 kg/(m^2).  Physical Exam  Constitutional: She  is oriented to person, place, and time. She appears distressed.  Overweight  HENT:  Head: Normocephalic.  Right Ear: External ear normal.  Left Ear: External ear normal.  Nose: Nose normal.  Mouth/Throat: Oropharynx is clear and moist. No oropharyngeal exudate.  Eyes: Conjunctivae and EOM are normal. Pupils are equal, round, and reactive to light.  Neck: No JVD present. No tracheal deviation present. No thyromegaly present.  Cardiovascular: Normal rate, regular rhythm, normal heart sounds and intact distal pulses.  Exam reveals no gallop and no friction rub.   No murmur heard. Pacemaker left upper chest wall  Pulmonary/Chest: No respiratory distress. She has no wheezes. She has no rales. She exhibits no tenderness.  Oxygen dependent. Comfortable at rest.  Abdominal: She exhibits no distension and no mass. There is no tenderness.  Musculoskeletal: Normal range of motion. She exhibits edema. She exhibits no tenderness.  Lymphadenopathy:    She has no cervical adenopathy.  Neurological: She is alert and oriented to person, place, and time. She has normal reflexes. No cranial nerve deficit. Coordination normal.  Skin: No rash noted. No erythema. No pallor.  Psychiatric: She has a normal mood and affect. Her behavior is normal. Judgment and thought content normal.        Labs reviewed: Nursing Home on 02/19/2014  Component Date Value Ref Range Status   INR 02/12/2014 2.6* 0.9 - 1.1 Final   INR 02/19/2014 2.6* 0.9 - 1.1 Final  Clinical Support  on 02/03/2014  Component Date Value Ref Range Status   Date Time Interrogation Session 02/08/2014 [id number]   Final   Pulse Generator Manufacturer 02/08/2014 St[employer] Jude [employer]   Final   Pulse Gen Model 02/08/2014 [address] RF   Final   Pulse Gen Serial Number 02/08/2014 [id number]   Final   RV Sense Sensitivity 02/08/2014 2   Final   RV Adaptation Mode 02/08/2014 Fixed Pacing   Final   RA Pace Amplitude 02/08/2014 1.375   Final   RV Pace PulseWidth 02/08/2014 0.5   Final   RV Pace Amplitude 02/08/2014 2.5   Final   Lead Channel Status 02/08/2014    Final   RA Impedance 02/08/2014 450   Final   RA Amplitude 02/08/2014 0.9   Final   RA Pacing Amplitude 02/08/2014 0.375   Final   RA Pacing PulseWidth 02/08/2014 0.5   Final   Lead Channel Status 02/08/2014    Final   RV IMPEDANCE 02/08/2014 750   Final   RV Amplitude 02/08/2014 12   Final   RV Pacing Amplitude 02/08/2014 1   Final   RV Pacing PulseWidth 02/08/2014 0.5   Final   Battery Status 02/08/2014 MOS   Final   Battery Longevity 02/08/2014 93   Final   Battery Voltage 02/08/2014 2.95   Final   Brady RA Perc Paced 02/08/2014 51   Final   Brady RV Perc Paced 02/08/2014 1.1   Final   Brady AP VP Percent 02/08/2014 1   Final   Brady AS VP Percent 02/08/2014 1   Final   Brady AP VS Percent 02/08/2014 50   Final   Brady AS VS Percent 02/08/2014 49   Final   Eval Rhythm 02/08/2014 Ap/Vs w/ 1st degree block   Final   Miscellaneous Comment 02/08/2014    Final                   Value:Pacemaker remote check. Device function reviewed. Impedance, sensing, auto capture thresholds consistent  with previous measurements. Histograms appropriate for patient and level of activity. All other diagnostic data reviewed and is appropriate and                          stable for patient. Real time/magnet EGM shows appropriate sensing and capture. 130 mode switches---no EGMs. No ventricular high rate episodes.  Estimated longevity 7.2-8.9yrs. Merlin 05/07/14 & ROV w/ SK 1/16.  Nursing Home on 01/15/2014  Component Date Value Ref Range Status   INR 01/15/2014 2.6* 0.9 - 1.1 Final  Nursing Home on 12/11/2013  Component Date Value Ref Range Status   INR 12/11/2013 2.6* 0.9 - 1.1 Final     Assessment/Plan  1. Long term current use of anticoagulant 4 atrial fibrillation. Currently therapeutic.  2. MDS (myelodysplastic syndrome), low grade Followed by Dr. Marin Olp  3. Pacemaker-St.Jude Functioning appropriately  4. Unspecified hypothyroidism Compensated  5. Abdominal pain, other specified site Resolved  6. Atrial fibrillation rate controlled  7. Chronic diastolic heart failure Compensated  8. Constipation Controlled  9. Cough Chronic and dry. Observe   10. Edema  stable  11. Depressive disorder, not elsewhere classified  controlled  12. DYSPNEA  stable  13. HYPERLIPIDEMIA  controlled  14. HYPERTENSION  control  15. OBSTRUCTIVE SLEEP APNEA  continue CPAP  16. Tremor, essential  mild

## 2014-03-04 NOTE — Assessment & Plan Note (Signed)
Stable on BiPAP 14/10 cm H2O and oxygen 2 liter oxygen.

## 2014-03-17 ENCOUNTER — Encounter: Payer: Self-pay | Admitting: Geriatric Medicine

## 2014-03-26 ENCOUNTER — Non-Acute Institutional Stay: Payer: Medicare Other | Admitting: Nurse Practitioner

## 2014-03-26 ENCOUNTER — Encounter: Payer: Self-pay | Admitting: Nurse Practitioner

## 2014-03-26 ENCOUNTER — Encounter: Payer: Self-pay | Admitting: Geriatric Medicine

## 2014-03-26 VITALS — BP 142/62 | HR 52 | Wt 228.0 lb

## 2014-03-26 DIAGNOSIS — Z7901 Long term (current) use of anticoagulants: Secondary | ICD-10-CM

## 2014-03-26 DIAGNOSIS — I4891 Unspecified atrial fibrillation: Secondary | ICD-10-CM

## 2014-03-26 DIAGNOSIS — I482 Chronic atrial fibrillation, unspecified: Secondary | ICD-10-CM

## 2014-03-26 LAB — POCT INR: INR: 2.7 — AB (ref <=1.1)

## 2014-03-26 NOTE — Progress Notes (Signed)
Patient ID: Cindy Cervantes, female   DOB: 07/19/1932, 80 y.o.   MRN: [id number]    Allergies  Allergen Reactions  . Codeine     Extreme lethargy  . Iron     By infusion  . Morphine   . Sulfa Antibiotics   . Lisbeth Ply [Fesoterodine Fumarate]     Chief Complaint  Patient presents with  . Medical Management of Chronic Issues    coag check     HPI: Patient is a 80 y.o. female resident of [place], [place] evaluated today for coagulation management.  Has been doing well since last INR check  Since last visit patient has continued taking warfarin as prescribed, no new medications or diet. No chest pains, palpitations, shortness of breath or any signs of bleeding.   Review of Systems:  DATA OBTAINED: from patient, GENERAL: Feels well.  No fevers, change in activity status, appetite, or weight  RESPIRATORY:  No wheezing, SOB. Carries compact O2 concentrator CARDIAC: No chest pain, palpitations. No edema GI:   No N/V/D or constipation  No heartburn or reflux   NEUROLOGIC: No dizziness, fainting, headache No change in mental status   PSYCHIATRIC: No feelings of anxiety, depression  Sleeps well     Past Medical History  Diagnosis Date  . Paroxysmal a-fib with RVR    Amiodarone  . History of hyperkalemia in setting of spironolactone 09/2010  . Sinus node dysfunction   . Pacemaker     Implanted December 2012  . Obesity   . Allergic rhinitis   . Hypertension   . Senile cataract   . Hyperlipidemia   . Xerostomia   . Dysphagia   . Cough     2nd to reflux  . OSA (obstructive sleep apnea)   . Diverticulosis of colon   . Obesity hypoventilation syndrome   . Colon polyp   . Tracheobronchomalacia   . Diaphragm dysfunction     Right hemidiaphragm  . Anemia of renal disease 10/21/2011  . Anemia, iron deficiency 10/21/2011  . MDS (myelodysplastic syndrome), low grade 10/21/2011  . Unspecified vitamin D deficiency   . Dehydration   . Hypopotassemia    . Other specified disease of white blood cells   . Chronic diastolic heart failure   . Reflux esophagitis   . Cholelithiasis   . Osteoarthrosis, unspecified whether generalized or localized, unspecified site   . Disorder of bone and cartilage, unspecified   . Urinary incontinence   . Debility, unspecified   . Long term (current) use of anticoagulants   . Hypothyroidism 02/08/2013  . GERD (gastroesophageal reflux disease) 02/08/2013  . Depression   . Constipation   . Rosacea 05/15/2013   Past Surgical History  Procedure Laterality Date  . Tubal ligation  1964  . Total knee arthroplasty  1999    right knee  . Tummy tuck  2000    pt "almost died"; respiratory distress, became delrious  . Total knee arthroplasty  2003    left knee  . Cataract extraction  2008  . Pacemaker placement  10/21/2010    dural chamber Dr. [name]   Social History:   reports that she quit smoking about 31 years ago. Her smoking use included Cigarettes. She smoked 0.50 packs per day. She has never used smokeless tobacco. She reports that she drinks about .6 ounces of alcohol per week. She reports that she does not use illicit drugs.  Family History  Problem Relation Age of Onset  . Heart  disease Father   . Heart disease Brother     Medications: Patient's Medications  New Prescriptions   No medications on file  Previous Medications   ACETAMINOPHEN (TYLENOL) 325 MG TABLET    Take 650 mg by mouth every 4 (four) hours as needed.    ALOE-SODIUM CHLORIDE (AYR SALINE NASAL GEL NA)    Place into the nose. Apply topically to each nostril four times daily as needed for soreness and dryness   AMIODARONE (PACERONE) 200 MG TABLET    Take 100 mg by mouth daily.    EZETIMIBE (ZETIA) 10 MG TABLET    Take 10 mg by mouth daily.   FLUTICASONE (FLONASE) 50 MCG/ACT NASAL SPRAY    Place 2 sprays into the nose 2 (two) times daily. 2 sprays in each nostril   HYDROCORTISONE CREAM 1 %    Apply topically as needed.   LEVOTHYROXINE  (SYNTHROID, LEVOTHROID) 25 MCG TABLET    Take 50 mcg by mouth daily.    MAGNESIUM 400 MG CAPS    Take by mouth. Take one tablet daily   METRONIDAZOLE (METROGEL) 1 % GEL    Apply topically. Apply to rash on nose once daily   NIACIN (NIASPAN) 500 MG CR TABLET    Take 1,000 mg by mouth at bedtime.    NON FORMULARY    1.5-2 L/min. Oxygen   POLYETHYLENE GLYCOL POWDER (MIRALAX) POWDER    Take 17 g by mouth daily. As directed   POTASSIUM CHLORIDE SA (K-DUR,KLOR-CON) 20 MEQ TABLET    Take 20 mEq by mouth daily.    TORSEMIDE (DEMADEX) 20 MG TABLET    Take 20 mg by mouth daily. Take 60mg  daily   TRIAMCINOLONE (NASACORT AQ) 55 MCG/ACT NASAL INHALER    Place 2 sprays into the nose daily.   WARFARIN (COUMADIN) 4 MG TABLET    Take 4 mg by mouth daily at 6 PM. Take once at; 4:00 pm - 6:00 pm. Daily  Modified Medications   No medications on file  Discontinued Medications   No medications on file     Physical Exam:  Filed Vitals:   03/26/14 1311  BP: 142/62  Pulse: 52  Weight: 228 lb (103.42 kg)  SpO2: 99%    GENERAL APPEARANCE: No acute distress, appropriately groomed, Obese body habitus Alert, pleasant, conversant. SKIN: No diaphoresis, rash, HEAD: Normocephalic, atraumatic EYES: Conjunctiva/lids clear   RESPIRATORY: Breathing is even, unlabored Lung sounds are decreased on rt (not new), clear and full on left CARDIOVASCULAR: Heart RRR   No murmur or extra heart sounds EDEMA: No peripheral edema   MUSCULOSKELETAL.  Gait is steady PSYCHIATRIC: Mood and affect appropriate to situation    Labs reviewed: Basic Metabolic Panel:  Recent Labs  10/15/13  NA 145  K 3.6  BUN 24*  CREATININE 1.6*  TSH 6.47*   Liver Function Tests:  Recent Labs  10/15/13  ALT 9  ALKPHOS 69   No results found for this basename: LIPASE, AMYLASE,  in the last 8760 hours No results found for this basename: AMMONIA,  in the last 8760 hours CBC:  Recent Labs  10/15/13  WBC 7.1  HGB 11.0*  HCT 33*  PLT  171   Lipid Panel:  Recent Labs  10/15/13  CHOL 198  LDLCALC 122  TRIG 100   TSH:  Recent Labs  10/15/13  TSH 6.47*   Lab Results  Component Value Date   INR 2.7* 03/26/2014   INR 2.6* 02/19/2014   INR  2.6* 02/12/2014     Assessment/Plan 1. Chronic atrial fibrillation Heart remains in regular rhythm, tolerating medications, to continue current medication including anticoagulation   2. Long term current use of anticoagulant INR remains therapeutic at 2.7; will cont current dose of warfarin and repeat INR in 4 weeks

## 2014-04-01 DIAGNOSIS — E039 Hypothyroidism, unspecified: Secondary | ICD-10-CM | POA: Diagnosis not present

## 2014-04-01 DIAGNOSIS — R945 Abnormal results of liver function studies: Secondary | ICD-10-CM | POA: Diagnosis not present

## 2014-04-11 DIAGNOSIS — L919 Hypertrophic disorder of the skin, unspecified: Secondary | ICD-10-CM | POA: Diagnosis not present

## 2014-04-11 DIAGNOSIS — L570 Actinic keratosis: Secondary | ICD-10-CM | POA: Diagnosis not present

## 2014-04-11 DIAGNOSIS — L821 Other seborrheic keratosis: Secondary | ICD-10-CM | POA: Diagnosis not present

## 2014-04-11 DIAGNOSIS — L909 Atrophic disorder of skin, unspecified: Secondary | ICD-10-CM | POA: Diagnosis not present

## 2014-04-11 DIAGNOSIS — D235 Other benign neoplasm of skin of trunk: Secondary | ICD-10-CM | POA: Diagnosis not present

## 2014-04-11 DIAGNOSIS — M7981 Nontraumatic hematoma of soft tissue: Secondary | ICD-10-CM | POA: Diagnosis not present

## 2014-04-11 DIAGNOSIS — D1801 Hemangioma of skin and subcutaneous tissue: Secondary | ICD-10-CM | POA: Diagnosis not present

## 2014-04-11 DIAGNOSIS — D239 Other benign neoplasm of skin, unspecified: Secondary | ICD-10-CM | POA: Diagnosis not present

## 2014-04-11 DIAGNOSIS — D2330 Other benign neoplasm of skin of unspecified part of face: Secondary | ICD-10-CM | POA: Diagnosis not present

## 2014-04-23 ENCOUNTER — Non-Acute Institutional Stay: Payer: Medicare Other | Admitting: Nurse Practitioner

## 2014-04-23 ENCOUNTER — Encounter: Payer: Self-pay | Admitting: Nurse Practitioner

## 2014-04-23 VITALS — BP 150/68 | HR 78 | Wt 228.0 lb

## 2014-04-23 DIAGNOSIS — I4891 Unspecified atrial fibrillation: Secondary | ICD-10-CM

## 2014-04-23 DIAGNOSIS — I10 Essential (primary) hypertension: Secondary | ICD-10-CM

## 2014-04-23 DIAGNOSIS — N039 Chronic nephritic syndrome with unspecified morphologic changes: Secondary | ICD-10-CM

## 2014-04-23 DIAGNOSIS — D631 Anemia in chronic kidney disease: Secondary | ICD-10-CM | POA: Diagnosis not present

## 2014-04-23 DIAGNOSIS — N189 Chronic kidney disease, unspecified: Secondary | ICD-10-CM

## 2014-04-23 DIAGNOSIS — E785 Hyperlipidemia, unspecified: Secondary | ICD-10-CM

## 2014-04-23 DIAGNOSIS — E039 Hypothyroidism, unspecified: Secondary | ICD-10-CM

## 2014-04-23 DIAGNOSIS — Z7901 Long term (current) use of anticoagulants: Secondary | ICD-10-CM

## 2014-04-23 DIAGNOSIS — I480 Paroxysmal atrial fibrillation: Secondary | ICD-10-CM

## 2014-04-23 NOTE — Progress Notes (Signed)
Patient ID: Cindy Cervantes, female   DOB: Feb 06, 1932, 80 y.o.   MRN: [id number]    Patient ID: Cindy Cervantes, female   DOB: 12/01/1931, 80 y.o.   MRN: [id number]  [facility] [phone])  Code Status: Full Code  Contact Information   Name Relation Home Work [place] Daughter [phone]        Chief Complaint  Patient presents with  . Medical Management of Chronic Issues    HPI: This is a 80 y.o. female resident of [place], [place] evaluated today for coagulation management.   Reports occasional right upper abdominal pain, mostly after she eats greasy food only happens about once a month. Easily resolves without intervention  Since last visit patient has continued taking warfarin as prescribed, no new medications. Doing well without complaints   Allergies  Allergen Reactions  . Codeine     Extreme lethargy  . Iron     By infusion  . Morphine   . Sulfa Antibiotics   . Toviaz [Fesoterodine Fumarate]    MEDICATION Current Outpatient Prescriptions on File Prior to Visit  Medication Sig Dispense Refill  . acetaminophen (TYLENOL) 325 MG tablet Take 650 mg by mouth every 4 (four) hours as needed.       . Aloe-Sodium Chloride (AYR SALINE NASAL GEL NA) Place into the nose. Apply topically to each nostril four times daily as needed for soreness and dryness      . amiodarone (PACERONE) 200 MG tablet Take 100 mg by mouth daily.       Marland Kitchen ezetimibe (ZETIA) 10 MG tablet Take 10 mg by mouth daily.      . fluticasone (FLONASE) 50 MCG/ACT nasal spray Place 2 sprays into the nose 2 (two) times daily. 2 sprays in each nostril      . hydrocortisone cream 1 % Apply topically as needed.      Marland Kitchen levothyroxine (SYNTHROID, LEVOTHROID) 25 MCG tablet Take 50 mcg by mouth daily.       . Magnesium 400 MG CAPS Take by mouth. Take one tablet daily      . metroNIDAZOLE (METROGEL) 1 % gel Apply topically. Apply to rash on nose once  daily      . niacin (NIASPAN) 500 MG CR tablet Take 1,000 mg by mouth at bedtime.       . NON FORMULARY 1.5-2 L/min. Oxygen      . polyethylene glycol powder (MIRALAX) powder Take 17 g by mouth daily. As directed      . potassium chloride SA (K-DUR,KLOR-CON) 20 MEQ tablet Take 20 mEq by mouth daily.       Marland Kitchen torsemide (DEMADEX) 20 MG tablet Take 20 mg by mouth daily. Take 60mg  daily      . triamcinolone (NASACORT AQ) 55 MCG/ACT nasal inhaler Place 2 sprays into the nose daily.  1 Inhaler  12  . warfarin (COUMADIN) 4 MG tablet Take 4 mg by mouth daily at 6 PM. Take once at; 4:00 pm - 6:00 pm. Daily       No current facility-administered medications on file prior to visit.     DATA REVIEWED  Radiologic Exams 11/14/2013 ABDOMEN - 2 VIEW   COMPARISON: None.  FINDINGS:  Scattered air and stool in the colon along with scattered air-filled small bowel loops. Mild fluid and air distended stomach. There also  some right upper quadrant small bowel loops that are mildly distended. Could not exclude an early small bowel  obstruction or  right upper quadrant inflammatory process with focal ileus. No free air. The soft tissue shadows are maintained.    Laboratory Studies  [facility], external    12/06/12  total cholesterol 198, triglyceride 88, HDL 57, LDL 147.  01/01/2013 TSH 3.365  04/01/14 TSH 5.203     POCT [facility] Lab Results  Component Value Date   INR 2.7* 03/26/2014   INR 2.6* 02/19/2014   INR 2.6* 02/12/2014     REVIEW OF SYSTEMS DATA OBTAINED: from patient, GENERAL: Feels well.  No fevers, change in activity status, appetite, or weight  SKIN: Rash on nose RESPIRATORY: Morning cough, runny nose and sneezing, last only a few minutes then resolves. No wheezing, SOB. Carries compact O2 concentrator CARDIAC: No chest pain, palpitations. No edema GI:   No N/V/D or constipation  No heartburn or reflux  NEUROLOGIC: No dizziness, fainting, headache No change in mental status    PSYCHIATRIC: No feelings of anxiety, depression  Sleeps well    PHYSICAL EXAM  Filed Vitals:   04/23/14 1336  BP: 150/68  Pulse: 78  Weight: 228 lb (103.42 kg)  SpO2: 98%   Body mass index is 40.4 kg/(m^2).  GENERAL APPEARANCE: No acute distress, appropriately groomed, Obese body habitus Alert, pleasant, conversant. SKIN: No diaphoresis, rash, HEAD: Normocephalic, atraumatic EYES: Conjunctiva/lids clear  RESPIRATORY: Breathing is even, unlabored Lung sounds are decreased on rt (not new), clear and full on left CARDIOVASCULAR: Heart RRR   No murmur or extra heart sounds   EDEMA: No peripheral edema   MUSCULOSKELETAL.  Uses power wheelchiar PSYCHIATRIC: Mood and affect appropriate to situation   ASSESSMENT/PLAN   1. HYPERTENSION -borderline elevated, will cont to follow and may need to add low dose medication at next visit   2. Paroxysmal atrial fibrillation Heart in regular rhythm, rate well controlled. Continue current medications including anticoagulation  3. Hypothyroidism TSH at 5, will cont current dose of synthroid  4. Anemia of renal disease Will follow up cbc  5. Long term current use of anticoagulant INR today 2.8, remains in therapeutic range, will cont 4 mg daily and follow up INR in 5 weeks  6. Hyperlipidemia -conts on zetia -will follow up fasting lipids before next visit

## 2014-05-07 ENCOUNTER — Ambulatory Visit (INDEPENDENT_AMBULATORY_CARE_PROVIDER_SITE_OTHER): Payer: Medicare Other | Admitting: *Deleted

## 2014-05-07 DIAGNOSIS — I495 Sick sinus syndrome: Secondary | ICD-10-CM

## 2014-05-07 NOTE — Progress Notes (Signed)
Remote pacemaker transmission.   

## 2014-05-12 LAB — MDC_IDC_ENUM_SESS_TYPE_REMOTE
Brady Statistic AP VP Percent: 1 %
Brady Statistic AP VS Percent: 51 %
Brady Statistic AS VP Percent: 1 %
Brady Statistic AS VS Percent: 48 %
Implantable Pulse Generator Serial Number: 7198677
Lead Channel Impedance Value: 460 Ohm
Lead Channel Impedance Value: 790 Ohm
Lead Channel Pacing Threshold Amplitude: 1 V
Lead Channel Pacing Threshold Pulse Width: 0.5 ms
Lead Channel Sensing Intrinsic Amplitude: 12 mV
Lead Channel Setting Pacing Amplitude: 1.375
Lead Channel Setting Pacing Amplitude: 2.5 V
Lead Channel Setting Pacing Pulse Width: 0.5 ms
MDC IDC MSMT BATTERY REMAINING LONGEVITY: 88 mo
MDC IDC MSMT BATTERY REMAINING PERCENTAGE: 67 %
MDC IDC MSMT BATTERY VOLTAGE: 2.93 V
MDC IDC MSMT LEADCHNL RA PACING THRESHOLD AMPLITUDE: 0.375 V
MDC IDC MSMT LEADCHNL RA PACING THRESHOLD PULSEWIDTH: 0.5 ms
MDC IDC MSMT LEADCHNL RA SENSING INTR AMPL: 1.2 mV
MDC IDC SESS DTM: 20150805062606
MDC IDC SET LEADCHNL RV SENSING SENSITIVITY: 2 mV
MDC IDC STAT BRADY RA PERCENT PACED: 52 %
MDC IDC STAT BRADY RV PERCENT PACED: 1 %

## 2014-05-19 ENCOUNTER — Other Ambulatory Visit: Payer: Self-pay

## 2014-05-19 DIAGNOSIS — Z1231 Encounter for screening mammogram for malignant neoplasm of breast: Secondary | ICD-10-CM

## 2014-05-27 ENCOUNTER — Encounter: Payer: Self-pay | Admitting: Cardiology

## 2014-05-27 DIAGNOSIS — E039 Hypothyroidism, unspecified: Secondary | ICD-10-CM | POA: Diagnosis not present

## 2014-05-27 DIAGNOSIS — E669 Obesity, unspecified: Secondary | ICD-10-CM | POA: Diagnosis not present

## 2014-05-27 DIAGNOSIS — D649 Anemia, unspecified: Secondary | ICD-10-CM | POA: Diagnosis not present

## 2014-05-27 DIAGNOSIS — I10 Essential (primary) hypertension: Secondary | ICD-10-CM | POA: Diagnosis not present

## 2014-05-27 LAB — BASIC METABOLIC PANEL
BUN: 21 mg/dL (ref 4–21)
Creatinine: 1.6 mg/dL — AB (ref 0.5–1.1)
Glucose: 85 mg/dL
Potassium: 3.7 mmol/L (ref 3.4–5.3)
Sodium: 141 mmol/L (ref 137–147)

## 2014-05-27 LAB — CBC AND DIFFERENTIAL
HCT: 31 % — AB (ref 36–46)
Hemoglobin: 10.4 g/dL — AB (ref 12.0–16.0)
PLATELETS: 173 10*3/uL (ref 150–399)
WBC: 6.9 10*3/mL

## 2014-05-27 LAB — HEPATIC FUNCTION PANEL
ALT: 10 U/L (ref 7–35)
AST: 13 U/L (ref 13–35)
Alkaline Phosphatase: 72 U/L (ref 25–125)
BILIRUBIN, TOTAL: 0.6 mg/dL
Bilirubin, Direct: 0.1 mg/dL (ref 0.01–0.4)

## 2014-05-28 ENCOUNTER — Non-Acute Institutional Stay: Payer: Medicare Other | Admitting: Nurse Practitioner

## 2014-05-28 ENCOUNTER — Encounter: Payer: Self-pay | Admitting: Nurse Practitioner

## 2014-05-28 VITALS — BP 148/76 | HR 68 | Temp 98.0°F | Wt 226.0 lb

## 2014-05-28 DIAGNOSIS — Z7901 Long term (current) use of anticoagulants: Secondary | ICD-10-CM | POA: Diagnosis not present

## 2014-05-28 DIAGNOSIS — I4891 Unspecified atrial fibrillation: Secondary | ICD-10-CM

## 2014-05-28 DIAGNOSIS — I480 Paroxysmal atrial fibrillation: Secondary | ICD-10-CM

## 2014-05-28 LAB — POCT INR: INR: 2.7 — AB (ref 0.9–1.1)

## 2014-05-28 NOTE — Progress Notes (Signed)
Patient ID: Cindy Cervantes, female   DOB: [birth year], 80 y.o.   MRN: [id number]  [facility] [phone])  Code Status: Full Code  Contact Information   Name Relation Home Work [place] Daughter [phone]        Chief Complaint  Patient presents with  . Medical Management of Chronic Issues    coag check  . Cough    for one week, didn't sleep well last night    HPI: This is a 80 y.o. female resident of [place], [place] evaluated today for coagulation management. Since last visit patient has continued taking warfarin as prescribed, no new medications. Reports she has stopped her spinach and fruit drinks in the morning. Stopped these about 2 weeks ago.     In the last week pt reports increase cough with productive sputum and congestion in lungs. Sore throat stated last week, then she started to cough which has made her chest hurt. Sputum is getting loose and easier to cough up.  No fevers or chills. Feels worse today then yesterday but did not get much sleep last night Hx of smoker and now on chronic O2    Allergies  Allergen Reactions  . Codeine     Extreme lethargy  . Iron     By infusion  . Morphine   . Sulfa Antibiotics   . Toviaz [Fesoterodine Fumarate]    MEDICATION Current Outpatient Prescriptions on File Prior to Visit  Medication Sig Dispense Refill  . acetaminophen (TYLENOL) 325 MG tablet Take 650 mg by mouth every 4 (four) hours as needed.       . Aloe-Sodium Chloride (AYR SALINE NASAL GEL NA) Place into the nose. Apply topically to each nostril four times daily as needed for soreness and dryness      . amiodarone (PACERONE) 200 MG tablet Take 100 mg by mouth daily.       Marland Kitchen ezetimibe (ZETIA) 10 MG tablet Take 10 mg by mouth daily.      . fluticasone (FLONASE) 50 MCG/ACT nasal spray Place 2 sprays into the nose 2 (two) times daily. 2 sprays in each nostril      . hydrocortisone cream 1 %  Apply topically as needed.      Marland Kitchen levothyroxine (SYNTHROID, LEVOTHROID) 25 MCG tablet Take 50 mcg by mouth daily.       . Magnesium 400 MG CAPS Take by mouth. Take one tablet daily      . metroNIDAZOLE (METROGEL) 1 % gel Apply topically. Apply to rash on nose once daily      . niacin (NIASPAN) 500 MG CR tablet Take 1,000 mg by mouth at bedtime.       . NON FORMULARY 1.5-2 L/min. Oxygen      . polyethylene glycol powder (MIRALAX) powder Take 17 g by mouth daily. As directed      . potassium chloride SA (K-DUR,KLOR-CON) 20 MEQ tablet Take 20 mEq by mouth daily.       Marland Kitchen torsemide (DEMADEX) 20 MG tablet Take 20 mg by mouth daily. Take 60mg  daily      . triamcinolone (NASACORT AQ) 55 MCG/ACT nasal inhaler Place 2 sprays into the nose daily.  1 Inhaler  12  . warfarin (COUMADIN) 4 MG tablet Take 4 mg by mouth daily at 6 PM. Take once at; 4:00 pm - 6:00 pm. Daily       No current facility-administered medications on file prior to visit.  DATA REVIEWED  Radiologic Exams 11/14/2013 ABDOMEN - 2 VIEW   COMPARISON: None.  FINDINGS:  Scattered air and stool in the colon along with scattered air-filled small bowel loops. Mild fluid and air distended stomach. There also  some right upper quadrant small bowel loops that are mildly distended. Could not exclude an early small bowel obstruction or  right upper quadrant inflammatory process with focal ileus. No free air. The soft tissue shadows are maintained.    Laboratory Studies  [facility], external    12/06/12  total cholesterol 198, triglyceride 88, HDL 57, LDL 147.  01/01/2013 TSH 3.365  04/01/14 TSH 5.203     POCT [facility] Lab Results  Component Value Date   INR 2.7* 05/28/2014   INR 2.7* 03/26/2014   INR 2.6* 02/19/2014     REVIEW OF SYSTEMS DATA OBTAINED: from patient, GENERAL: Feels well other than the fact she did not get sleep last night. No fevers, change in activity status, appetite, or weight  Nose: Increased sinus drainage  but this has improved.  RESPIRATORY: reports cough and congestion . No wheezing, SOB. Carries compact O2 concentrator CARDIAC: No chest pain, palpitations. No edema GI:   No N/V/D or constipation  No heartburn or reflux  NEUROLOGIC: No dizziness, fainting, headache No change in mental status  PSYCHIATRIC: No feelings of anxiety, depression  Sleeps well except recently worse due to cough  PHYSICAL EXAM  Filed Vitals:   05/28/14 1107  BP: 148/76  Pulse: 68  Temp: 98 F (36.7 C)  TempSrc: Oral  Weight: 226 lb (102.513 kg)  SpO2: 99%   Body mass index is 40.04 kg/(m^2).  Physical Exam  Constitutional: She is oriented to person, place, and time. She appears well-developed and well-nourished. No distress.  HENT:  Mouth/Throat: Oropharynx is clear and moist. No oropharyngeal exudate.  Neck: Normal range of motion. Neck supple.  Cardiovascular: Normal rate, regular rhythm and normal heart sounds.   Pulmonary/Chest: Effort normal and breath sounds normal. No respiratory distress. She has no wheezes. She has no rales.  Abdominal: Soft. Bowel sounds are normal. She exhibits no distension.  Musculoskeletal: She exhibits no edema and no tenderness.  Lymphadenopathy:    She has no cervical adenopathy.  Neurological: She is alert and oriented to person, place, and time.  Skin: Skin is warm and dry. She is not diaphoretic.  Psychiatric: She has a normal mood and affect.     ASSESSMENT/PLAN   1. Paroxysmal atrial fibrillation Heart in regular rhythm, rate well controlled. Continue current medications including anticoagulation  2. Long term current use of anticoagulant INR today 2.7 remains in therapeutic range, will cont 4 mg daily and follow up INR in 2 weeks since she has stopped her spinach intake  3. Cough  Overall improved, feels like it is due to worsening sinuses but these symptoms are better will have her take mucinex dm scheduled twice daily for 5 days then as needed  -to  notify provider if cough, congestion becomes worse or fever occurs

## 2014-06-03 ENCOUNTER — Encounter: Payer: Self-pay | Admitting: Internal Medicine

## 2014-06-11 ENCOUNTER — Encounter: Payer: Self-pay | Admitting: Nurse Practitioner

## 2014-06-11 ENCOUNTER — Non-Acute Institutional Stay: Payer: Medicare Other | Admitting: Nurse Practitioner

## 2014-06-11 VITALS — BP 160/82 | HR 75 | Wt 224.0 lb

## 2014-06-11 DIAGNOSIS — Z7901 Long term (current) use of anticoagulants: Secondary | ICD-10-CM

## 2014-06-11 DIAGNOSIS — R05 Cough: Secondary | ICD-10-CM

## 2014-06-11 DIAGNOSIS — I480 Paroxysmal atrial fibrillation: Secondary | ICD-10-CM

## 2014-06-11 DIAGNOSIS — R059 Cough, unspecified: Secondary | ICD-10-CM | POA: Diagnosis not present

## 2014-06-11 DIAGNOSIS — I4891 Unspecified atrial fibrillation: Secondary | ICD-10-CM | POA: Diagnosis not present

## 2014-06-11 LAB — POCT INR: INR: 3.8 — AB (ref 0.9–1.1)

## 2014-06-11 NOTE — Progress Notes (Signed)
Patient ID: Cindy Cervantes, female   DOB: [birth year], 80 y.o.   MRN: [id number]  [facility] [phone])  Code Status: Full Code  Contact Information   Name Relation Home Work [place] Daughter [phone]        Chief Complaint  Patient presents with  . Medical Management of Chronic Issues    coag check     HPI: This is a 80 y.o. female resident of [place], [place] evaluated today for coagulation management. Since last visit patient has continued taking warfarin as prescribed, no new medications or changes in diet (still has stopped her spinach and fruit drinks in the morning).   She reports cough with productive sputum and congestion in lungs has improved greatly. Had lost her appetite while she was not feeling well and this has slowly increased. Trying to cut back on serving size and desserts.    Allergies  Allergen Reactions  . Codeine     Extreme lethargy  . Iron     By infusion  . Morphine   . Sulfa Antibiotics   . Toviaz [Fesoterodine Fumarate]    MEDICATION Current Outpatient Prescriptions on File Prior to Visit  Medication Sig Dispense Refill  . acetaminophen (TYLENOL) 325 MG tablet Take 650 mg by mouth every 4 (four) hours as needed.       . Aloe-Sodium Chloride (AYR SALINE NASAL GEL NA) Place into the nose. Apply topically to each nostril four times daily as needed for soreness and dryness      . amiodarone (PACERONE) 200 MG tablet Take 100 mg by mouth daily.       Marland Kitchen Dextromethorphan-Guaifenesin (ROBITUSSIN DM) 10-100 MG/5ML liquid Take 5 mLs by mouth. 10cc every 4 hours as needed for cough      . ezetimibe (ZETIA) 10 MG tablet Take 10 mg by mouth daily.      . fluticasone (FLONASE) 50 MCG/ACT nasal spray Place 2 sprays into the nose 2 (two) times daily. 2 sprays in each nostril      . hydrocortisone cream 1 % Apply topically as needed.      Marland Kitchen levothyroxine (SYNTHROID, LEVOTHROID) 25 MCG  tablet Take 50 mcg by mouth daily.       . Magnesium 400 MG CAPS Take by mouth. Take one tablet daily      . metroNIDAZOLE (METROGEL) 1 % gel Apply topically. Apply to rash on nose once daily      . niacin (NIASPAN) 500 MG CR tablet Take 1,000 mg by mouth at bedtime.       . NON FORMULARY 1.5-2 L/min. Oxygen      . polyethylene glycol powder (MIRALAX) powder Take 17 g by mouth daily. As directed      . potassium chloride SA (K-DUR,KLOR-CON) 20 MEQ tablet Take 20 mEq by mouth daily.       Marland Kitchen torsemide (DEMADEX) 20 MG tablet Take 20 mg by mouth daily. Take 60mg  daily      . triamcinolone (NASACORT AQ) 55 MCG/ACT nasal inhaler Place 2 sprays into the nose daily.  1 Inhaler  12  . warfarin (COUMADIN) 4 MG tablet Take 4 mg by mouth daily at 6 PM. Take once at; 4:00 pm - 6:00 pm. Daily       No current facility-administered medications on file prior to visit.     DATA REVIEWED  Radiologic Exams 11/14/2013 ABDOMEN - 2 VIEW   COMPARISON: None.  FINDINGS:  Scattered air and  stool in the colon along with scattered air-filled small bowel loops. Mild fluid and air distended stomach. There also  some right upper quadrant small bowel loops that are mildly distended. Could not exclude an early small bowel obstruction or  right upper quadrant inflammatory process with focal ileus. No free air. The soft tissue shadows are maintained.    Laboratory Studies  [facility], external    12/06/12  total cholesterol 198, triglyceride 88, HDL 57, LDL 147.  01/01/2013 TSH 3.365  04/01/14 TSH 5.203   Labs reviewed: Basic Metabolic Panel:  Recent Labs  10/15/13 05/27/14  NA 145 141  K 3.6 3.7  BUN 24* 21  CREATININE 1.6* 1.6*   Liver Function Tests:  Recent Labs  10/15/13 05/27/14  AST  --  13  ALT 9 10  ALKPHOS 69 72   No results found for this basename: LIPASE, AMYLASE,  in the last 8760 hours No results found for this basename: AMMONIA,  in the last 8760 hours CBC:  Recent Labs  10/15/13 05/27/14   WBC 7.1 6.9  HGB 11.0* 10.4*  HCT 33* 31*  PLT 171 173   CBG: No results found for this basename: GLUCAP,  in the last 8760 hours TSH:  Recent Labs  10/15/13  TSH 6.47*   Lipid Panel:  Recent Labs  10/15/13  CHOL 198  LDLCALC 122  TRIG 100      PO[facility] Lab Results  Component Value Date   INR 3.8* 06/11/2014   INR 2.7* 05/28/2014   INR 2.7* 03/26/2014     REVIEW OF SYSTEMS DATA OBTAINED: from patient, GENERAL: Feels well increase in energy after cough and congestion has improved. No fevers, change in activity status, appetite, has lost weight due to eating less RESPIRATORY: reports cough and congestion has improved. No wheezing, no worsening SOB. Carries compact O2 concentrator CARDIAC: No chest pain, palpitations. No edema GI:   No N/V/D or constipation  No heartburn or reflux  NEUROLOGIC: No dizziness, fainting, headache No change in mental status  PSYCHIATRIC: No feelings of anxiety, depression  Sleeps well except recently worse due to cough  PHYSICAL EXAM  Filed Vitals:   06/11/14 0936  BP: 160/82  Pulse: 75  Weight: 224 lb (101.606 kg)  SpO2: 99%   Body mass index is 39.69 kg/(m^2).  Physical Exam  Constitutional: She is oriented to person, place, and time. She appears well-developed and well-nourished. No distress.  HENT:  Mouth/Throat: Oropharynx is clear and moist. No oropharyngeal exudate.  Neck: Normal range of motion. Neck supple.  Cardiovascular: Normal rate, regular rhythm and normal heart sounds.   Pulmonary/Chest: Effort normal and breath sounds normal. No respiratory distress. She has no wheezes. She has no rales.  Abdominal: Soft. Bowel sounds are normal. She exhibits no distension.  Musculoskeletal: She exhibits no edema and no tenderness.  Lymphadenopathy:    She has no cervical adenopathy.  Neurological: She is alert and oriented to person, place, and time.  Skin: Skin is warm and dry. She is not diaphoretic.   Psychiatric: She has a normal mood and affect.     ASSESSMENT/PLAN  1. Paroxysmal atrial fibrillation Heart rate regular rate and rhythm at this time. Will cont current medications including anticoagulation   2. Long term current use of anticoagulant -INR elevatedat 3.8 has stopped spinach drinks -will hold coumadin today, restart tomorrow at 3 mg daily and will follow up in 1 week.   3. Cough Improved

## 2014-06-18 ENCOUNTER — Encounter: Payer: Self-pay | Admitting: Nurse Practitioner

## 2014-06-18 ENCOUNTER — Non-Acute Institutional Stay: Payer: Medicare Other | Admitting: Nurse Practitioner

## 2014-06-18 VITALS — BP 152/72 | HR 70 | Wt 227.0 lb

## 2014-06-18 DIAGNOSIS — Z7901 Long term (current) use of anticoagulants: Secondary | ICD-10-CM

## 2014-06-18 DIAGNOSIS — I10 Essential (primary) hypertension: Secondary | ICD-10-CM | POA: Diagnosis not present

## 2014-06-18 DIAGNOSIS — N189 Chronic kidney disease, unspecified: Secondary | ICD-10-CM

## 2014-06-18 DIAGNOSIS — D631 Anemia in chronic kidney disease: Secondary | ICD-10-CM | POA: Diagnosis not present

## 2014-06-18 DIAGNOSIS — I4891 Unspecified atrial fibrillation: Secondary | ICD-10-CM | POA: Diagnosis not present

## 2014-06-18 DIAGNOSIS — N039 Chronic nephritic syndrome with unspecified morphologic changes: Secondary | ICD-10-CM

## 2014-06-18 DIAGNOSIS — I5032 Chronic diastolic (congestive) heart failure: Secondary | ICD-10-CM | POA: Diagnosis not present

## 2014-06-18 DIAGNOSIS — I480 Paroxysmal atrial fibrillation: Secondary | ICD-10-CM

## 2014-06-18 LAB — POCT INR: INR: 2.4 — AB (ref 0.9–1.1)

## 2014-06-18 NOTE — Progress Notes (Signed)
Patient ID: Cindy Cervantes, female   DOB: [birth year], 80 y.o.   MRN: [id number]      [facility] [phone])  Code Status: Full Code  Contact Information   Name Relation Home Work Effie Daughter [phone]        Chief Complaint  Patient presents with  . Medical Management of Chronic Issues    coag check     HPI: This is a 80 y.o. female resident of [place], [place] evaluated today for coagulation management. Since last visit patient has continued taking warfarin 3 mg daily  as prescribed, no new medications or changes in diet. No bleeding noted.   Reports increase mucous, went outside and sneezing got worse as well. No shortness of breath or fevers. Reports worsening allergies in the fall that has been ongoing for many years.   Appetite is back, eating 3 meals a day and has gained weight back.   Allergies  Allergen Reactions  . Codeine     Extreme lethargy  . Iron     By infusion  . Morphine   . Sulfa Antibiotics   . Toviaz [Fesoterodine Fumarate]    MEDICATION Current Outpatient Prescriptions on File Prior to Visit  Medication Sig Dispense Refill  . acetaminophen (TYLENOL) 325 MG tablet Take 650 mg by mouth every 4 (four) hours as needed.       . Aloe-Sodium Chloride (AYR SALINE NASAL GEL NA) Place into the nose. Apply topically to each nostril four times daily as needed for soreness and dryness      . amiodarone (PACERONE) 200 MG tablet Take 100 mg by mouth daily.       Marland Kitchen Dextromethorphan-Guaifenesin (ROBITUSSIN DM) 10-100 MG/5ML liquid Take 5 mLs by mouth. 10cc every 4 hours as needed for cough      . ezetimibe (ZETIA) 10 MG tablet Take 10 mg by mouth daily.      . fluticasone (FLONASE) 50 MCG/ACT nasal spray Place 2 sprays into the nose 2 (two) times daily. 2 sprays in each nostril      . hydrocortisone cream 1 % Apply topically as needed.      Marland Kitchen levothyroxine (SYNTHROID, LEVOTHROID)  25 MCG tablet Take 50 mcg by mouth daily.       . Magnesium 400 MG CAPS Take by mouth. Take one tablet daily      . metroNIDAZOLE (METROGEL) 1 % gel Apply topically. Apply to rash on nose once daily      . niacin (NIASPAN) 500 MG CR tablet Take 1,000 mg by mouth at bedtime.       . NON FORMULARY 1.5-2 L/min. Oxygen      . polyethylene glycol powder (MIRALAX) powder Take 17 g by mouth daily. As directed      . potassium chloride SA (K-DUR,KLOR-CON) 20 MEQ tablet Take 20 mEq by mouth daily.       . sodium chloride (OCEAN) 0.65 % SOLN nasal spray 1 spray. One spray into nostril every 2 hours as needed for nasal congestion      . torsemide (DEMADEX) 20 MG tablet Take 20 mg by mouth daily. Take 60mg  daily      . triamcinolone (NASACORT AQ) 55 MCG/ACT nasal inhaler Place 2 sprays into the nose daily.  1 Inhaler  12   No current facility-administered medications on file prior to visit.     DATA REVIEWED  Radiologic Exams 11/14/2013 ABDOMEN - 2 VIEW   COMPARISON: None.  FINDINGS:  Scattered air and stool in the colon along with scattered air-filled small bowel loops. Mild fluid and air distended stomach. There also  some right upper quadrant small bowel loops that are mildly distended. Could not exclude an early small bowel obstruction or  right upper quadrant inflammatory process with focal ileus. No free air. The soft tissue shadows are maintained.    Laboratory Studies  [facility], external    12/06/12  total cholesterol 198, triglyceride 88, HDL 57, LDL 147.  01/01/2013 TSH 3.365  04/01/14 TSH 5.203   Labs reviewed: Basic Metabolic Panel:  Recent Labs  10/15/13 05/27/14  NA 145 141  K 3.6 3.7  BUN 24* 21  CREATININE 1.6* 1.6*   Liver Function Tests:  Recent Labs  10/15/13 05/27/14  AST  --  13  ALT 9 10  ALKPHOS 69 72   No results found for this basename: LIPASE, AMYLASE,  in the last 8760 hours No results found for this basename: AMMONIA,  in the last 8760  hours CBC:  Recent Labs  10/15/13 05/27/14  WBC 7.1 6.9  HGB 11.0* 10.4*  HCT 33* 31*  PLT 171 173   CBG: No results found for this basename: GLUCAP,  in the last 8760 hours TSH:  Recent Labs  10/15/13  TSH 6.47*   Lipid Panel:  Recent Labs  10/15/13  CHOL 198  LDLCALC 122  TRIG 100      POCT [facility] Lab Results  Component Value Date   INR 2.4* 06/18/2014   INR 3.8* 06/11/2014   INR 2.7* 05/28/2014     REVIEW OF SYSTEMS Review of Systems  Constitutional: Negative for fever, chills, weight loss and malaise/fatigue.  HENT: Positive for congestion (in chest only with mucous ). Negative for ear discharge, sore throat and tinnitus.   Respiratory: Negative for cough, sputum production, shortness of breath and wheezing.   Cardiovascular: Negative for chest pain, palpitations and leg swelling.  Gastrointestinal: Negative for abdominal pain, diarrhea, constipation and blood in stool.  Genitourinary: Negative for dysuria and urgency.  Musculoskeletal: Negative for myalgias.  Neurological: Negative for dizziness, tingling, weakness and headaches.  Endo/Heme/Allergies: Positive for environmental allergies.  Psychiatric/Behavioral: Negative for depression. The patient is not nervous/anxious and does not have insomnia.     PHYSICAL EXAM  Filed Vitals:   06/18/14 0939  BP: 152/72  Pulse: 70  Weight: 227 lb (102.967 kg)  SpO2: 98%   Body mass index is 40.22 kg/(m^2).  Physical Exam  Constitutional: She is oriented to person, place, and time. She appears well-developed and well-nourished. No distress.  HENT:  Mouth/Throat: Oropharynx is clear and moist. No oropharyngeal exudate.  Neck: Normal range of motion. Neck supple.  Cardiovascular: Normal rate, regular rhythm and normal heart sounds.   Pulmonary/Chest: Effort normal and breath sounds normal. No respiratory distress. She has no wheezes. She has no rales.  Abdominal: Soft. Bowel sounds are normal. She  exhibits no distension.  Musculoskeletal: She exhibits no edema and no tenderness.  Lymphadenopathy:    She has no cervical adenopathy.  Neurological: She is alert and oriented to person, place, and time.  Skin: Skin is warm and dry. She is not diaphoretic.  Psychiatric: She has a normal mood and affect.     ASSESSMENT/PLAN    1. HYPERTENSION - elevated somewhat today however still at goal <160/90, will cont to monitor  2. Paroxysmal atrial fibrillation -remains in RRR at this time. Will cont medication as well has anticoagulation  3. Anemia of renal disease -labs stable, reviewed with pt, no changes noted  4. Chronic diastolic heart failure -currently  On long term O2, 98% on 2 L, will have pt decrease to 1L and monitor O2 she has her on pulse Ox so will check level and record. To keep O2 above 90%  5. Long term current use of anticoagulant -INR therapeutic at 2.4 will cont 3 mg daily and follow up in 2 weeks.

## 2014-06-26 ENCOUNTER — Encounter (INDEPENDENT_AMBULATORY_CARE_PROVIDER_SITE_OTHER): Payer: Self-pay

## 2014-06-26 ENCOUNTER — Ambulatory Visit
Admission: RE | Admit: 2014-06-26 | Discharge: 2014-06-26 | Disposition: A | Discharge status: Home / Self Care | Payer: Medicare Other | Source: Ambulatory Visit

## 2014-06-26 DIAGNOSIS — Z1231 Encounter for screening mammogram for malignant neoplasm of breast: Secondary | ICD-10-CM

## 2014-07-02 ENCOUNTER — Non-Acute Institutional Stay: Payer: Medicare Other | Admitting: Nurse Practitioner

## 2014-07-02 ENCOUNTER — Encounter: Payer: Self-pay | Admitting: Nurse Practitioner

## 2014-07-02 VITALS — BP 142/68 | HR 62 | Wt 225.0 lb

## 2014-07-02 DIAGNOSIS — I4891 Unspecified atrial fibrillation: Secondary | ICD-10-CM | POA: Diagnosis not present

## 2014-07-02 DIAGNOSIS — I5032 Chronic diastolic (congestive) heart failure: Secondary | ICD-10-CM | POA: Diagnosis not present

## 2014-07-02 DIAGNOSIS — Z7901 Long term (current) use of anticoagulants: Secondary | ICD-10-CM | POA: Diagnosis not present

## 2014-07-02 DIAGNOSIS — K5900 Constipation, unspecified: Secondary | ICD-10-CM | POA: Diagnosis not present

## 2014-07-02 DIAGNOSIS — I480 Paroxysmal atrial fibrillation: Secondary | ICD-10-CM

## 2014-07-02 LAB — POCT INR: INR: 1.4 — AB (ref 0.9–1.1)

## 2014-07-02 NOTE — Progress Notes (Signed)
Patient ID: Cindy Cervantes, female   DOB: 18-Sep-1932, 80 y.o.   MRN: [id number]    Nursing Home Location:  [place] of Service: Clinic (12)  PCP: Estill Dooms, MD  Allergies  Allergen Reactions  . Codeine     Extreme lethargy  . Iron     By infusion  . Morphine   . Sulfa Antibiotics   . Lisbeth Ply [Fesoterodine Fumarate]     Chief Complaint  Patient presents with  . Medical Management of Chronic Issues    coag check     HPI:  This is a 80 y.o. female resident of [place], [place] evaluated today for coagulation management. Since last visit patient has continued taking warfarin 3 mg daily as prescribed, no new medications or changes in diet. No bleeding noted.  Reports increase mucous, went outside and sneezing got worse as well. No shortness of breath or fevers. Reports worsening allergies in the fall that has been ongoing for many years.  Appetite is back, eating 3 meals a day and has gained weight back.    Review of Systems:  Review of Systems  Constitutional: Negative for activity change, appetite change, fatigue and unexpected weight change.  HENT: Negative for congestion, hearing loss and postnasal drip.   Eyes: Negative.   Respiratory: Negative for cough and shortness of breath.        Wears chronic O2  Cardiovascular: Negative for chest pain, palpitations and leg swelling.  Gastrointestinal: Positive for constipation (taking miralax ). Negative for abdominal pain and diarrhea.  Genitourinary: Negative for dysuria and difficulty urinating.  Musculoskeletal: Negative for arthralgias and myalgias.  Skin: Negative for color change and wound.  Allergic/Immunologic: Positive for environmental allergies.  Neurological: Negative for dizziness and weakness.  Hematological: Does not bruise/bleed easily.  Psychiatric/Behavioral: Negative for behavioral problems and agitation.    Past Medical History    Diagnosis Date  . Paroxysmal a-fib with RVR    Amiodarone  . History of hyperkalemia in setting of spironolactone 09/2010  . Sinus node dysfunction   . Pacemaker     Implanted December 2012  . Obesity   . Allergic rhinitis   . Hypertension   . Senile cataract   . Hyperlipidemia   . Xerostomia   . Dysphagia   . Cough     2nd to reflux  . OSA (obstructive sleep apnea)   . Diverticulosis of colon   . Obesity hypoventilation syndrome   . Colon polyp   . Tracheobronchomalacia   . Diaphragm dysfunction     Right hemidiaphragm  . Anemia of renal disease 10/21/2011  . Anemia, iron deficiency 10/21/2011  . MDS (myelodysplastic syndrome), low grade 10/21/2011  . Unspecified vitamin D deficiency   . Dehydration   . Hypopotassemia   . Other specified disease of white blood cells   . Chronic diastolic heart failure   . Reflux esophagitis   . Cholelithiasis   . Osteoarthrosis, unspecified whether generalized or localized, unspecified site   . Disorder of bone and cartilage, unspecified   . Urinary incontinence   . Debility, unspecified   . Long term (current) use of anticoagulants   . Hypothyroidism 02/08/2013  . GERD (gastroesophageal reflux disease) 02/08/2013  . Depression   . Constipation   . Rosacea 05/15/2013   Past Surgical History  Procedure Laterality Date  . Tubal ligation  1964  . Total knee arthroplasty  1999    right knee  . Tummy  tuck  2000    pt "almost died"; respiratory distress, became delrious  . Total knee arthroplasty  2003    left knee  . Cataract extraction  2008  . Pacemaker placement  10/21/2010    dural chamber Dr. [name]   Social History:   reports that she quit smoking about 31 years ago. Her smoking use included Cigarettes. She smoked 0.50 packs per day. She has never used smokeless tobacco. She reports that she drinks about .6 ounces of alcohol per week. She reports that she does not use illicit drugs.  Family History  Problem Relation Age of Onset   . Heart disease Father   . Heart disease Brother     Medications: Patient's Medications  New Prescriptions   No medications on file  Previous Medications   ACETAMINOPHEN (TYLENOL) 325 MG TABLET    Take 650 mg by mouth every 4 (four) hours as needed.    ALOE-SODIUM CHLORIDE (AYR SALINE NASAL GEL NA)    Place into the nose. Apply topically to each nostril four times daily as needed for soreness and dryness   AMIODARONE (PACERONE) 200 MG TABLET    Take 100 mg by mouth daily.    DEXTROMETHORPHAN-GUAIFENESIN (ROBITUSSIN DM) 10-100 MG/5ML LIQUID    Take 5 mLs by mouth. 10cc every 4 hours as needed for cough   EZETIMIBE (ZETIA) 10 MG TABLET    Take 10 mg by mouth daily.   FLUTICASONE (FLONASE) 50 MCG/ACT NASAL SPRAY    Place 2 sprays into the nose 2 (two) times daily. 2 sprays in each nostril   HYDROCORTISONE CREAM 1 %    Apply topically as needed.   LEVOTHYROXINE (SYNTHROID, LEVOTHROID) 25 MCG TABLET    Take 50 mcg by mouth daily.    LORATADINE (CLARITIN) 10 MG TABLET    Take 10 mg by mouth daily.   MAGNESIUM 400 MG CAPS    Take by mouth. Take one tablet daily   METRONIDAZOLE (METROGEL) 1 % GEL    Apply topically. Apply to rash on nose once daily   NIACIN (NIASPAN) 500 MG CR TABLET    Take 1,000 mg by mouth at bedtime.    NON FORMULARY    1.5-2 L/min. Oxygen   POLYETHYLENE GLYCOL POWDER (MIRALAX) POWDER    Take 17 g by mouth daily. As directed   POTASSIUM CHLORIDE SA (K-DUR,KLOR-CON) 20 MEQ TABLET    Take 20 mEq by mouth daily.    SODIUM CHLORIDE (OCEAN) 0.65 % SOLN NASAL SPRAY    1 spray. One spray into nostril every 2 hours as needed for nasal congestion   TORSEMIDE (DEMADEX) 20 MG TABLET    Take 20 mg by mouth daily. Take 60mg  daily   TRIAMCINOLONE (NASACORT AQ) 55 MCG/ACT NASAL INHALER    Place 2 sprays into the nose daily.   WARFARIN (COUMADIN) 3 MG TABLET    Take 3 mg by mouth daily.  Modified Medications   No medications on file  Discontinued Medications   No medications on file      Physical Exam: Filed Vitals:   07/02/14 0931  BP: 142/68  Pulse: 62  Weight: 225 lb (102.059 kg)  SpO2: 96%    Physical Exam  Constitutional: She is oriented to person, place, and time. She appears well-developed and well-nourished. No distress.  HENT:  Head: Normocephalic and atraumatic.  Mouth/Throat: Oropharynx is clear and moist. No oropharyngeal exudate.  Eyes: Conjunctivae are normal. Pupils are equal, round, and reactive to light.  Neck: Normal range of motion. Neck supple.  Cardiovascular: Normal rate, regular rhythm and normal heart sounds.   Pulmonary/Chest: Effort normal and breath sounds normal.  Abdominal: Soft. Bowel sounds are normal.  Musculoskeletal: She exhibits no edema and no tenderness.  Neurological: She is alert and oriented to person, place, and time.  Skin: Skin is warm and dry. She is not diaphoretic.  Psychiatric: She has a normal mood and affect.    Labs reviewed: 12/06/12 total cholesterol 198, triglyceride 88, HDL 57, LDL 147.  01/01/2013 TSH 3.365  04/01/14 TSH 6.203   Basic Metabolic Panel:  Recent Labs  10/15/13 05/27/14  NA 145 141  K 3.6 3.7  BUN 24* 21  CREATININE 1.6* 1.6*   Liver Function Tests:  Recent Labs  10/15/13 05/27/14  AST  --  13  ALT 9 10  ALKPHOS 69 72   No results found for this basename: LIPASE, AMYLASE,  in the last 8760 hours No results found for this basename: AMMONIA,  in the last 8760 hours CBC:  Recent Labs  10/15/13 05/27/14  WBC 7.1 6.9  HGB 11.0* 10.4*  HCT 33* 31*  PLT 171 173   TSH:  Recent Labs  10/15/13  TSH 6.47*   A1C: Lab Results  Component Value Date   HGBA1C  Value: 6.7 (NOTE)                                                                       According to the ADA Clinical Practice Recommendations for 2011, when HbA1c is used as a screening test:   >=6.5%   Diagnostic of Diabetes Mellitus           (if abnormal result  is confirmed)  5.7-6.4%   Increased risk of developing  Diabetes Mellitus  References:Diagnosis and Classification of Diabetes Mellitus,Diabetes TDHR,4163,84(TXMIW 1):S62-S69 and Standards of Medical Care in         Diabetes - 2011,Diabetes OEHO,1224,82  (Suppl 1):S11-S61.* 09/07/2010   Lipid Panel:  Recent Labs  10/15/13  CHOL 198  LDLCALC 122  TRIG 100   INR Lab Results  Component Value Date   INR 1.4* 07/02/2014   INR 2.4* 06/18/2014   INR 3.8* 06/11/2014     Assessment/Plan 1. Chronic diastolic heart failure -stable, has maintained o2 over 96% on 1 L, pt reports she sometimes forgets O2 and checks pulse ox will be 97%, will have pt only wear O2 as needed for saturations less than 90%, she has home O2 pulse ox so will be able to test this.   2. Constipation -improved with miralax, educated to increase water, use of fiber and activity   3. Paroxysmal atrial fibrillation -currently in SR, will cont all medication including anticoagulant   4. Long term current use of anticoagulant -will change coumadin to 3 mg daily on Saturday Sunday Tuesday and Thursday and 4 mg on Monday, Wednesday, Friday -Follow up INR in 2 weeks

## 2014-07-03 ENCOUNTER — Telehealth: Payer: Self-pay | Admitting: Pulmonary Disease

## 2014-07-03 NOTE — Telephone Encounter (Signed)
LMTCB x 1 

## 2014-07-04 NOTE — Telephone Encounter (Signed)
Pt called back this morning and said forget the whole thing, said she had a question, but know longer needs it answered.Cindy Cervantes

## 2014-07-04 NOTE — Telephone Encounter (Signed)
LM for patient--advised via voicemail that if anything needed further regarding BiPAP to give [place] a call back.  Will close message

## 2014-07-08 DIAGNOSIS — Z23 Encounter for immunization: Secondary | ICD-10-CM | POA: Diagnosis not present

## 2014-07-16 ENCOUNTER — Encounter: Payer: Self-pay | Admitting: Nurse Practitioner

## 2014-07-16 ENCOUNTER — Non-Acute Institutional Stay: Payer: Medicare Other | Admitting: Nurse Practitioner

## 2014-07-16 VITALS — BP 156/64 | HR 71 | Wt 227.0 lb

## 2014-07-16 DIAGNOSIS — I5032 Chronic diastolic (congestive) heart failure: Secondary | ICD-10-CM

## 2014-07-16 DIAGNOSIS — I480 Paroxysmal atrial fibrillation: Secondary | ICD-10-CM | POA: Diagnosis not present

## 2014-07-16 DIAGNOSIS — Z7901 Long term (current) use of anticoagulants: Secondary | ICD-10-CM

## 2014-07-16 LAB — POCT INR: INR: 1.9 — AB (ref 0.9–1.1)

## 2014-07-16 MED ORDER — WARFARIN SODIUM 3 MG PO TABS: 3.0000 mg | ORAL_TABLET | Freq: Every day | ORAL | Status: DC

## 2014-07-16 NOTE — Progress Notes (Signed)
Patient ID: Cindy Cervantes, female   DOB: 02-03-1932, 80 y.o.   MRN: [id number]    Nursing Home Location:  [place] of Service: Clinic (12)  PCP: Estill Dooms, MD  Allergies  Allergen Reactions  . Codeine     Extreme lethargy  . Iron     By infusion  . Morphine   . Sulfa Antibiotics   . Lisbeth Ply [Fesoterodine Fumarate]     Chief Complaint  Patient presents with  . Medical Management of Chronic Issues    coag check     HPI:  This is a 80 y.o. female resident of [place], [place] evaluated today for coagulation management. Since last visit patient has continued taking warfarin 3 mg Saturday Sunday Tuesday and Thursday with 4 mg coumadin on Monday, Wednesday, Friday as prescribed, no new medications or changes in diet. No bleeding noted.   Using O2 PRN now, keeps a check on her O2 sats and has only needed to use o2 a couple of times with increase activity, overall feeling much better off O2. Increased energy. Not falling asleep as often.    Review of Systems:  Review of Systems  Constitutional: Negative for activity change, appetite change, fatigue and unexpected weight change.  HENT: Negative for congestion, hearing loss, nosebleeds and postnasal drip.   Eyes: Negative.   Respiratory: Negative for cough and shortness of breath.        Wears O2 only as needed now.   Cardiovascular: Negative for chest pain, palpitations and leg swelling.  Gastrointestinal: Positive for constipation (controlled on medication). Negative for abdominal pain, diarrhea and blood in stool.  Genitourinary: Negative for dysuria, hematuria, vaginal bleeding and difficulty urinating.  Musculoskeletal: Negative for arthralgias and myalgias.  Skin: Negative for color change and wound.  Allergic/Immunologic: Positive for environmental allergies.  Neurological: Negative for dizziness and weakness.  Hematological: Does not bruise/bleed  easily.  Psychiatric/Behavioral: Negative for behavioral problems and agitation.    Past Medical History  Diagnosis Date  . Paroxysmal a-fib with RVR    Amiodarone  . History of hyperkalemia in setting of spironolactone 09/2010  . Sinus node dysfunction   . Pacemaker     Implanted December 2012  . Obesity   . Allergic rhinitis   . Hypertension   . Senile cataract   . Hyperlipidemia   . Xerostomia   . Dysphagia   . Cough     2nd to reflux  . OSA (obstructive sleep apnea)   . Diverticulosis of colon   . Obesity hypoventilation syndrome   . Colon polyp   . Tracheobronchomalacia   . Diaphragm dysfunction     Right hemidiaphragm  . Anemia of renal disease 10/21/2011  . Anemia, iron deficiency 10/21/2011  . MDS (myelodysplastic syndrome), low grade 10/21/2011  . Unspecified vitamin D deficiency   . Dehydration   . Hypopotassemia   . Other specified disease of white blood cells   . Chronic diastolic heart failure   . Reflux esophagitis   . Cholelithiasis   . Osteoarthrosis, unspecified whether generalized or localized, unspecified site   . Disorder of bone and cartilage, unspecified   . Urinary incontinence   . Debility, unspecified   . Long term (current) use of anticoagulants   . Hypothyroidism 02/08/2013  . GERD (gastroesophageal reflux disease) 02/08/2013  . Depression   . Constipation   . Rosacea 05/15/2013   Past Surgical History  Procedure Laterality Date  . Tubal ligation  1964  . Total knee arthroplasty  1999    right knee  . Tummy tuck  2000    pt "almost died"; respiratory distress, became delrious  . Total knee arthroplasty  2003    left knee  . Cataract extraction  2008  . Pacemaker placement  10/21/2010    dural chamber Dr. [name]   Social History:   reports that she quit smoking about 31 years ago. Her smoking use included Cigarettes. She smoked 0.50 packs per day. She has never used smokeless tobacco. She reports that she drinks about .6 ounces of alcohol  per week. She reports that she does not use illicit drugs.  Family History  Problem Relation Age of Onset  . Heart disease Father   . Heart disease Brother     Medications: Patient's Medications  New Prescriptions   No medications on file  Previous Medications   ACETAMINOPHEN (TYLENOL) 325 MG TABLET    Take 650 mg by mouth every 4 (four) hours as needed.    ALOE-SODIUM CHLORIDE (AYR SALINE NASAL GEL NA)    Place into the nose. Apply topically to each nostril four times daily as needed for soreness and dryness   AMIODARONE (PACERONE) 200 MG TABLET    Take 100 mg by mouth daily.    DEXTROMETHORPHAN-GUAIFENESIN (ROBITUSSIN DM) 10-100 MG/5ML LIQUID    Take 5 mLs by mouth. 10cc every 4 hours as needed for cough   EZETIMIBE (ZETIA) 10 MG TABLET    Take 10 mg by mouth daily.   FLUTICASONE (FLONASE) 50 MCG/ACT NASAL SPRAY    Place 2 sprays into the nose 2 (two) times daily. 2 sprays in each nostril   HYDROCORTISONE CREAM 1 %    Apply topically as needed.   LEVOTHYROXINE (SYNTHROID, LEVOTHROID) 25 MCG TABLET    Take 50 mcg by mouth daily.    LORATADINE (CLARITIN) 10 MG TABLET    Take 10 mg by mouth daily.   MAGNESIUM 400 MG CAPS    Take by mouth. Take one tablet daily   METRONIDAZOLE (METROGEL) 1 % GEL    Apply topically. Apply to rash on nose once daily   NIACIN (NIASPAN) 500 MG CR TABLET    Take 1,000 mg by mouth at bedtime.    NON FORMULARY    1.5-2 L/min. Oxygen   POLYETHYLENE GLYCOL POWDER (MIRALAX) POWDER    Take 17 g by mouth daily. As directed   POTASSIUM CHLORIDE SA (K-DUR,KLOR-CON) 20 MEQ TABLET    Take 20 mEq by mouth daily.    SODIUM CHLORIDE (OCEAN) 0.65 % SOLN NASAL SPRAY    1 spray. One spray into nostril every 2 hours as needed for nasal congestion   TORSEMIDE (DEMADEX) 20 MG TABLET    Take 20 mg by mouth daily. Take 60mg  daily   TRIAMCINOLONE (NASACORT AQ) 55 MCG/ACT NASAL INHALER    Place 2 sprays into the nose daily.   WARFARIN (COUMADIN) 3 MG TABLET    Take 3 mg by mouth.  Take one Sat, Sun, Tu, Thur; 4 mg M, W, F  Modified Medications   No medications on file  Discontinued Medications   No medications on file     Physical Exam: Filed Vitals:   07/16/14 0938  BP: 156/64  Pulse: 71  Weight: 227 lb (102.967 kg)  SpO2: 95%    Physical Exam  Constitutional: She is oriented to person, place, and time. She appears well-developed and well-nourished. No distress.  Neck: Normal range of  motion. Neck supple.  Cardiovascular: Normal rate, regular rhythm and normal heart sounds.   Pulmonary/Chest: Effort normal and breath sounds normal.  Abdominal: Soft. Bowel sounds are normal.  Musculoskeletal: She exhibits no edema.  Neurological: She is alert and oriented to person, place, and time.  Skin: Skin is warm and dry. She is not diaphoretic.  Psychiatric: She has a normal mood and affect.    Labs reviewed: 12/06/12 total cholesterol 198, triglyceride 88, HDL 57, LDL 147.  01/01/2013 TSH 3.365  04/01/14 TSH 6.222   Basic Metabolic Panel:  Recent Labs  10/15/13 05/27/14  NA 145 141  K 3.6 3.7  BUN 24* 21  CREATININE 1.6* 1.6*   Liver Function Tests:  Recent Labs  10/15/13 05/27/14  AST  --  13  ALT 9 10  ALKPHOS 69 72   No results found for this basename: LIPASE, AMYLASE,  in the last 8760 hours No results found for this basename: AMMONIA,  in the last 8760 hours CBC:  Recent Labs  10/15/13 05/27/14  WBC 7.1 6.9  HGB 11.0* 10.4*  HCT 33* 31*  PLT 171 173   TSH:  Recent Labs  10/15/13  TSH 6.47*   A1C: Lab Results  Component Value Date   HGBA1C  Value: 6.7 (NOTE)                                                                       According to the ADA Clinical Practice Recommendations for 2011, when HbA1c is used as a screening test:   >=6.5%   Diagnostic of Diabetes Mellitus           (if abnormal result  is confirmed)  5.7-6.4%   Increased risk of developing Diabetes Mellitus  References:Diagnosis and Classification of Diabetes  [place],Diabetes LNLG,9211,94(RDEYC 1):S62-S69 and Standards of Medical Care in         Diabetes - 2011,Diabetes XKGY,1856,31  (Suppl 1):S11-S61.* 09/07/2010   Lipid Panel:  Recent Labs  10/15/13  CHOL 198  LDLCALC 122  TRIG 100   INR Lab Results  Component Value Date   INR 1.9* 07/16/2014   INR 1.4* 07/02/2014   INR 2.4* 06/18/2014     Assessment/Plan 1. Chronic diastolic heart failure -using O2 PRN. O2 saturations maintaining over 90% and will use 2L if needed   2. Paroxysmal atrial fibrillation -remains in SR, will cont all medication including anticoagulant   3. Long term current use of anticoagulant -INR remains sub-therapeutic at 1.9, will change coumadin to 4 mg daily on Saturday Sunday Tuesday and Thursday and 3 mg on Monday, Wednesday, Friday -Follow up INR in 2 weeks

## 2014-07-30 ENCOUNTER — Non-Acute Institutional Stay: Payer: Medicare Other | Admitting: Nurse Practitioner

## 2014-07-30 ENCOUNTER — Encounter: Payer: Self-pay | Admitting: Nurse Practitioner

## 2014-07-30 VITALS — BP 154/62 | HR 52 | Wt 225.0 lb

## 2014-07-30 DIAGNOSIS — I480 Paroxysmal atrial fibrillation: Secondary | ICD-10-CM

## 2014-07-30 DIAGNOSIS — Z7901 Long term (current) use of anticoagulants: Secondary | ICD-10-CM

## 2014-07-30 LAB — POCT INR: INR: 2.6 — AB (ref <=1.1)

## 2014-07-30 NOTE — Progress Notes (Signed)
Patient ID: Cindy Cervantes, female   DOB: [birth year], 80 y.o.   MRN: [id number]    Nursing Home Location:  [place] of Service: Clinic (12)  PCP: Estill Dooms, MD  Allergies  Allergen Reactions  . Codeine     Extreme lethargy  . Iron     By infusion  . Morphine   . Sulfa Antibiotics   . Lisbeth Ply [Fesoterodine Fumarate]     Chief Complaint  Patient presents with  . Medical Management of Chronic Issues    coag check     HPI:  This is a 80 y.o. female resident of [place], [place] evaluated today for coagulation management. Since last visit patient has continued taking warfarin 4 mg Saturday Sunday Tuesday and Thursday with 3 mg coumadin on Monday, Wednesday, Friday as prescribed, no new medications or changes in diet. No bleeding noted.   Review of Systems:  Review of Systems  Constitutional: Negative for activity change, appetite change, fatigue and unexpected weight change.  HENT: Negative for congestion, hearing loss, nosebleeds and postnasal drip.   Eyes: Negative.   Respiratory: Negative for cough and shortness of breath.        Wears O2 only as needed now.   Cardiovascular: Negative for chest pain, palpitations and leg swelling.  Gastrointestinal: Positive for constipation (controlled on medication). Negative for abdominal pain, diarrhea and blood in stool.  Genitourinary: Negative for dysuria, hematuria, vaginal bleeding and difficulty urinating.  Musculoskeletal: Negative for arthralgias and myalgias.  Skin: Negative for color change and wound.  Allergic/Immunologic: Positive for environmental allergies.  Neurological: Negative for dizziness and weakness.  Hematological: Does not bruise/bleed easily.    Past Medical History  Diagnosis Date  . Paroxysmal a-fib with RVR    Amiodarone  . History of hyperkalemia in setting of spironolactone 09/2010  . Sinus node dysfunction   . Pacemaker    Implanted December 2012  . Obesity   . Allergic rhinitis   . Hypertension   . Senile cataract   . Hyperlipidemia   . Xerostomia   . Dysphagia   . Cough     2nd to reflux  . OSA (obstructive sleep apnea)   . Diverticulosis of colon   . Obesity hypoventilation syndrome   . Colon polyp   . Tracheobronchomalacia   . Diaphragm dysfunction     Right hemidiaphragm  . Anemia of renal disease 10/21/2011  . Anemia, iron deficiency 10/21/2011  . MDS (myelodysplastic syndrome), low grade 10/21/2011  . Unspecified vitamin D deficiency   . Dehydration   . Hypopotassemia   . Other specified disease of white blood cells   . Chronic diastolic heart failure   . Reflux esophagitis   . Cholelithiasis   . Osteoarthrosis, unspecified whether generalized or localized, unspecified site   . Disorder of bone and cartilage, unspecified   . Urinary incontinence   . Debility, unspecified   . Long term (current) use of anticoagulants   . Hypothyroidism 02/08/2013  . GERD (gastroesophageal reflux disease) 02/08/2013  . Depression   . Constipation   . Rosacea 05/15/2013   Past Surgical History  Procedure Laterality Date  . Tubal ligation  1964  . Total knee arthroplasty  1999    right knee  . Tummy tuck  2000    pt "almost died"; respiratory distress, became delrious  . Total knee arthroplasty  2003    left knee  . Cataract extraction  2008  . Pacemaker  placement  10/21/2010    dural chamber Dr. [name]   Social History:   reports that she quit smoking about 31 years ago. Her smoking use included Cigarettes. She smoked 0.50 packs per day. She has never used smokeless tobacco. She reports that she drinks about .6 ounces of alcohol per week. She reports that she does not use illicit drugs.  Family History  Problem Relation Age of Onset  . Heart disease Father   . Heart disease Brother     Medications: Patient's Medications  New Prescriptions   No medications on file  Previous Medications    ACETAMINOPHEN (TYLENOL) 325 MG TABLET    Take 650 mg by mouth every 4 (four) hours as needed.    ALOE-SODIUM CHLORIDE (AYR SALINE NASAL GEL NA)    Place into the nose. Apply topically to each nostril four times daily as needed for soreness and dryness   AMIODARONE (PACERONE) 200 MG TABLET    Take 100 mg by mouth daily.    DEXTROMETHORPHAN-GUAIFENESIN (ROBITUSSIN DM) 10-100 MG/5ML LIQUID    Take 5 mLs by mouth. 10cc every 4 hours as needed for cough   EZETIMIBE (ZETIA) 10 MG TABLET    Take 10 mg by mouth daily.   FLUTICASONE (FLONASE) 50 MCG/ACT NASAL SPRAY    Place 2 sprays into the nose 2 (two) times daily. 2 sprays in each nostril   HYDROCORTISONE CREAM 1 %    Apply topically as needed.   LEVOTHYROXINE (SYNTHROID, LEVOTHROID) 25 MCG TABLET    Take 50 mcg by mouth daily.    LORATADINE (CLARITIN) 10 MG TABLET    Take 10 mg by mouth daily.   MAGNESIUM 400 MG CAPS    Take by mouth. Take one tablet daily   METRONIDAZOLE (METROGEL) 1 % GEL    Apply topically. Apply to rash on nose once daily   NIACIN (NIASPAN) 500 MG CR TABLET    Take 1,000 mg by mouth at bedtime.    NON FORMULARY    1.5-2 L/min. Oxygen   POLYETHYLENE GLYCOL POWDER (MIRALAX) POWDER    Take 17 g by mouth daily. As directed   POTASSIUM CHLORIDE SA (K-DUR,KLOR-CON) 20 MEQ TABLET    Take 20 mEq by mouth daily.    SODIUM CHLORIDE (OCEAN) 0.65 % SOLN NASAL SPRAY    1 spray. One spray into nostril every 2 hours as needed for nasal congestion   TORSEMIDE (DEMADEX) 20 MG TABLET    Take 20 mg by mouth daily. Take 60mg  daily   TRIAMCINOLONE (NASACORT AQ) 55 MCG/ACT NASAL INHALER    Place 2 sprays into the nose daily.   WARFARIN (COUMADIN) 3 MG TABLET    Take 1-1.5 tablets (3-4.5 mg total) by mouth daily at 6 PM. Take 4  mg Sat, Sun, Tu, Thur; 3 mg M, W, F  Modified Medications   No medications on file  Discontinued Medications   No medications on file     Physical Exam: Filed Vitals:   07/30/14 0934  BP: 154/62  Pulse: 52  Weight:  225 lb (102.059 kg)  SpO2: 90%    Physical Exam  Constitutional: She is oriented to person, place, and time. She appears well-developed and well-nourished. No distress.  Neck: Normal range of motion. Neck supple.  Cardiovascular: Normal rate, regular rhythm and normal heart sounds.   Pulmonary/Chest: Effort normal and breath sounds normal.  Abdominal: Soft. Bowel sounds are normal.  Musculoskeletal: She exhibits no edema.  Neurological: She is alert and  oriented to person, place, and time.  Skin: Skin is warm and dry. She is not diaphoretic.  Psychiatric: She has a normal mood and affect.    Labs reviewed: 12/06/12 total cholesterol 198, triglyceride 88, HDL 57, LDL 147.  01/01/2013 TSH 3.365  04/01/14 TSH 3.785   Basic Metabolic Panel:  Recent Labs  10/15/13 05/27/14  NA 145 141  K 3.6 3.7  BUN 24* 21  CREATININE 1.6* 1.6*   Liver Function Tests:  Recent Labs  10/15/13 05/27/14  AST  --  13  ALT 9 10  ALKPHOS 69 72   No results found for this basename: LIPASE, AMYLASE,  in the last 8760 hours No results found for this basename: AMMONIA,  in the last 8760 hours CBC:  Recent Labs  10/15/13 05/27/14  WBC 7.1 6.9  HGB 11.0* 10.4*  HCT 33* 31*  PLT 171 173   TSH:  Recent Labs  10/15/13  TSH 6.47*   A1C: Lab Results  Component Value Date   HGBA1C  Value: 6.7 (NOTE)                                                                       According to the ADA Clinical Practice Recommendations for 2011, when HbA1c is used as a screening test:   >=6.5%   Diagnostic of Diabetes Mellitus           (if abnormal result  is confirmed)  5.7-6.4%   Increased risk of developing Diabetes Mellitus  References:Diagnosis and Classification of Diabetes Mellitus,Diabetes YIFO,2774,12(INOMV 1):S62-S69 and Standards of Medical Care in         Diabetes - 2011,Diabetes [facility],9470,96  (Suppl 1):S11-S61.* 09/07/2010   Lipid Panel:  Recent Labs  10/15/13  CHOL 198  LDLCALC 122  TRIG 100    INR Lab Results  Component Value Date   INR 2.6* 07/30/2014   INR 1.9* 07/16/2014   INR 1.4* 07/02/2014     Assessment/Plan  1. Paroxysmal atrial fibrillation -remains in SR and rate controlled, will cont medication including anticoagulant   2. Long term current use of anticoagulant -INR is therapeutic at this time, will continue coumadin 4 mg daily on Saturday Sunday Tuesday and Thursday and 3 mg on Monday, Wednesday, Friday -Follow up INR in 3 weeks

## 2014-08-07 ENCOUNTER — Ambulatory Visit (INDEPENDENT_AMBULATORY_CARE_PROVIDER_SITE_OTHER): Payer: Medicare Other | Admitting: *Deleted

## 2014-08-07 ENCOUNTER — Telehealth: Payer: Self-pay | Admitting: Cardiology

## 2014-08-07 DIAGNOSIS — I495 Sick sinus syndrome: Secondary | ICD-10-CM | POA: Diagnosis not present

## 2014-08-07 LAB — MDC_IDC_ENUM_SESS_TYPE_REMOTE
Battery Remaining Percentage: 66 %
Battery Voltage: 2.93 V
Brady Statistic AP VP Percent: 1 %
Brady Statistic AS VP Percent: 1 %
Brady Statistic RV Percent Paced: 1 %
Date Time Interrogation Session: 20151105191545
Implantable Pulse Generator Serial Number: 7198677
Lead Channel Impedance Value: 410 Ohm
Lead Channel Impedance Value: 740 Ohm
Lead Channel Pacing Threshold Pulse Width: 0.5 ms
Lead Channel Sensing Intrinsic Amplitude: 0.6 mV
Lead Channel Sensing Intrinsic Amplitude: 10.2 mV
Lead Channel Setting Pacing Amplitude: 1.375
Lead Channel Setting Pacing Amplitude: 2.5 V
Lead Channel Setting Pacing Pulse Width: 0.5 ms
Lead Channel Setting Sensing Sensitivity: 2 mV
MDC IDC MSMT BATTERY REMAINING LONGEVITY: 85 mo
MDC IDC MSMT LEADCHNL RA PACING THRESHOLD AMPLITUDE: 0.375 V
MDC IDC MSMT LEADCHNL RA PACING THRESHOLD PULSEWIDTH: 0.5 ms
MDC IDC MSMT LEADCHNL RV PACING THRESHOLD AMPLITUDE: 1 V
MDC IDC STAT BRADY AP VS PERCENT: 51 %
MDC IDC STAT BRADY AS VS PERCENT: 48 %
MDC IDC STAT BRADY RA PERCENT PACED: 51 %

## 2014-08-07 NOTE — Progress Notes (Signed)
Remote pacemaker transmission.   

## 2014-08-07 NOTE — Telephone Encounter (Signed)
Spoke with pt and reminded pt of remote transmission that is due today. Pt verbalized understanding.   

## 2014-08-20 ENCOUNTER — Non-Acute Institutional Stay: Payer: Medicare Other | Admitting: Nurse Practitioner

## 2014-08-20 ENCOUNTER — Encounter: Payer: Self-pay | Admitting: Nurse Practitioner

## 2014-08-20 VITALS — BP 142/72 | HR 68 | Temp 97.7°F | Wt 225.0 lb

## 2014-08-20 DIAGNOSIS — I480 Paroxysmal atrial fibrillation: Secondary | ICD-10-CM | POA: Diagnosis not present

## 2014-08-20 DIAGNOSIS — Z7901 Long term (current) use of anticoagulants: Secondary | ICD-10-CM | POA: Diagnosis not present

## 2014-08-20 LAB — POCT INR: INR: 2.7 — AB (ref <=1.1)

## 2014-08-20 NOTE — Progress Notes (Signed)
Patient ID: Cindy Cervantes, female   DOB: 1931-11-10, 80 y.o.   MRN: [id number]    Nursing Home Location:  [place] of Service: Clinic (12)  PCP: Estill Dooms, MD  Allergies  Allergen Reactions  . Codeine     Extreme lethargy  . Iron     By infusion  . Morphine   . Sulfa Antibiotics   . Lisbeth Ply [Fesoterodine Fumarate]     Chief Complaint  Patient presents with  . Medical Management of Chronic Issues    coag check     HPI:  This is a 80 y.o. female resident of [place], [place] here today for coagulation management. Since last visit patient has continued taking warfarin 4 mg Saturday Sunday Tuesday and Thursday with 3 mg coumadin on Monday, Wednesday, Friday as prescribed, no new medications or changes in diet. No missed doses. No bleeding noted.   Review of Systems:  Review of Systems  Constitutional: Negative for activity change, appetite change, fatigue and unexpected weight change.  HENT: Negative for congestion, hearing loss, nosebleeds and postnasal drip.   Eyes: Negative.   Respiratory: Negative for shortness of breath. Cough: worse in the morning, always resolved after breakfast.        Wears O2 only as needed now.   Cardiovascular: Negative for chest pain, palpitations and leg swelling.  Gastrointestinal: Positive for constipation (controlled on medication). Negative for abdominal pain, diarrhea and blood in stool.  Genitourinary: Negative for dysuria, hematuria, vaginal bleeding and difficulty urinating.  Musculoskeletal: Negative for myalgias and arthralgias.  Skin: Negative for color change and wound.  Allergic/Immunologic: Positive for environmental allergies.  Neurological: Negative for dizziness and weakness.  Hematological: Does not bruise/bleed easily.    Past Medical History  Diagnosis Date  . Paroxysmal a-fib with RVR    Amiodarone  . History of hyperkalemia in setting of  spironolactone 09/2010  . Sinus node dysfunction   . Pacemaker     Implanted December 2012  . Obesity   . Allergic rhinitis   . Hypertension   . Senile cataract   . Hyperlipidemia   . Xerostomia   . Dysphagia   . Cough     2nd to reflux  . OSA (obstructive sleep apnea)   . Diverticulosis of colon   . Obesity hypoventilation syndrome   . Colon polyp   . Tracheobronchomalacia   . Diaphragm dysfunction     Right hemidiaphragm  . Anemia of renal disease 10/21/2011  . Anemia, iron deficiency 10/21/2011  . MDS (myelodysplastic syndrome), low grade 10/21/2011  . Unspecified vitamin D deficiency   . Dehydration   . Hypopotassemia   . Other specified disease of white blood cells   . Chronic diastolic heart failure   . Reflux esophagitis   . Cholelithiasis   . Osteoarthrosis, unspecified whether generalized or localized, unspecified site   . Disorder of bone and cartilage, unspecified   . Urinary incontinence   . Debility, unspecified   . Long term (current) use of anticoagulants   . Hypothyroidism 02/08/2013  . GERD (gastroesophageal reflux disease) 02/08/2013  . Depression   . Constipation   . Rosacea 05/15/2013   Past Surgical History  Procedure Laterality Date  . Tubal ligation  1964  . Total knee arthroplasty  1999    right knee  . Tummy tuck  2000    pt "almost died"; respiratory distress, became delrious  . Total knee arthroplasty  2003  left knee  . Cataract extraction  2008  . Pacemaker placement  10/21/2010    dural chamber Dr. [name]   Social History:   reports that she quit smoking about 31 years ago. Her smoking use included Cigarettes. She smoked 0.50 packs per day. She has never used smokeless tobacco. She reports that she drinks about 0.6 oz of alcohol per week. She reports that she does not use illicit drugs.  Family History  Problem Relation Age of Onset  . Heart disease Father   . Heart disease Brother     Medications: Patient's Medications  New  Prescriptions   No medications on file  Previous Medications   ACETAMINOPHEN (TYLENOL) 325 MG TABLET    Take 650 mg by mouth every 4 (four) hours as needed.    ALOE-SODIUM CHLORIDE (AYR SALINE NASAL GEL NA)    Place into the nose. Apply topically to each nostril four times daily as needed for soreness and dryness   AMIODARONE (PACERONE) 200 MG TABLET    Take 100 mg by mouth daily.    DEXTROMETHORPHAN-GUAIFENESIN (ROBITUSSIN DM) 10-100 MG/5ML LIQUID    Take 5 mLs by mouth. 10cc every 4 hours as needed for cough   EZETIMIBE (ZETIA) 10 MG TABLET    Take 10 mg by mouth daily.   FLUTICASONE (FLONASE) 50 MCG/ACT NASAL SPRAY    Place 2 sprays into the nose 2 (two) times daily. 2 sprays in each nostril   HYDROCORTISONE CREAM 1 %    Apply topically as needed.   LEVOTHYROXINE (SYNTHROID, LEVOTHROID) 25 MCG TABLET    Take 50 mcg by mouth daily.    LORATADINE (CLARITIN) 10 MG TABLET    Take 10 mg by mouth daily.   MAGNESIUM 400 MG CAPS    Take by mouth. Take one tablet daily   METRONIDAZOLE (METROGEL) 1 % GEL    Apply topically. Apply to rash on nose once daily   NIACIN (NIASPAN) 500 MG CR TABLET    Take 1,000 mg by mouth at bedtime.    NON FORMULARY    1.5-2 L/min. Oxygen   POLYETHYLENE GLYCOL POWDER (MIRALAX) POWDER    Take 17 g by mouth daily. As directed   POTASSIUM CHLORIDE SA (K-DUR,KLOR-CON) 20 MEQ TABLET    Take 20 mEq by mouth daily.    SODIUM CHLORIDE (OCEAN) 0.65 % SOLN NASAL SPRAY    1 spray. One spray into nostril every 2 hours as needed for nasal congestion   TORSEMIDE (DEMADEX) 20 MG TABLET    Take 20 mg by mouth daily. Take 60mg  daily   TRIAMCINOLONE (NASACORT AQ) 55 MCG/ACT NASAL INHALER    Place 2 sprays into the nose daily.   WARFARIN (COUMADIN) 3 MG TABLET    Take by mouth daily at 6 PM. Take 4  mg Sat, Sun, Tu, Thur; 3 mg M, W, F  Modified Medications   No medications on file  Discontinued Medications   No medications on file     Physical Exam: Filed Vitals:   08/20/14 0913    BP: 142/72  Pulse: 68  Temp: 97.7 F (36.5 C)  TempSrc: Oral  Weight: 225 lb (102.059 kg)  SpO2: 91%    Physical Exam  Constitutional: She is oriented to person, place, and time. She appears well-developed and well-nourished. No distress.  Neck: Normal range of motion. Neck supple.  Cardiovascular: Normal rate, regular rhythm and normal heart sounds.   Pulmonary/Chest: Effort normal and breath sounds normal.  Abdominal: Soft.  Bowel sounds are normal.  Musculoskeletal: She exhibits no edema.  Neurological: She is alert and oriented to person, place, and time.  Skin: Skin is warm and dry. She is not diaphoretic.  Psychiatric: She has a normal mood and affect.    Labs reviewed: 12/06/12 total cholesterol 198, triglyceride 88, HDL 57, LDL 147.  01/01/2013 TSH 3.365  04/01/14 TSH 4.128   Basic Metabolic Panel:  Recent Labs  10/15/13 05/27/14  NA 145 141  K 3.6 3.7  BUN 24* 21  CREATININE 1.6* 1.6*   Liver Function Tests:  Recent Labs  10/15/13 05/27/14  AST  --  13  ALT 9 10  ALKPHOS 69 72   No results for input(s): LIPASE, AMYLASE in the last 8760 hours. No results for input(s): AMMONIA in the last 8760 hours. CBC:  Recent Labs  10/15/13 05/27/14  WBC 7.1 6.9  HGB 11.0* 10.4*  HCT 33* 31*  PLT 171 173   TSH:  Recent Labs  10/15/13  TSH 6.47*   A1C: Lab Results  Component Value Date   HGBA1C * 09/07/2010    6.7 (NOTE)                                                                       According to the ADA Clinical Practice Recommendations for 2011, when HbA1c is used as a screening test:   >=6.5%   Diagnostic of Diabetes Mellitus           (if abnormal result  is confirmed)  5.7-6.4%   Increased risk of developing Diabetes Mellitus  References:Diagnosis and Classification of Diabetes Mellitus,Diabetes NOMV,6720,94(BSJGG 1):S62-S69 and Standards of Medical Care in         Diabetes - 2011,Diabetes Care,2011,34  (Suppl 1):S11-S61.   Lipid Panel:  Recent  Labs  10/15/13  CHOL 198  LDLCALC 122  TRIG 100   INR Lab Results  Component Value Date   INR 2.7* 08/20/2014   INR 2.6* 07/30/2014   INR 1.9* 07/16/2014     Assessment/Plan  1. Paroxysmal atrial fibrillation -remains in SR and rate controlled, to cont current medication including anticoagulant   2. Long term current use of anticoagulant -INR is therapeutic at this time, will continue coumadin 4 mg daily on Saturday Sunday Tuesday and Thursday and 3 mg on Monday, Wednesday, Friday  -Follow up INR in 4 weeks

## 2014-09-03 ENCOUNTER — Encounter: Payer: Self-pay | Admitting: Cardiology

## 2014-09-08 ENCOUNTER — Encounter: Payer: Self-pay | Admitting: Internal Medicine

## 2014-09-08 ENCOUNTER — Non-Acute Institutional Stay: Payer: Medicare Other | Admitting: Internal Medicine

## 2014-09-08 VITALS — BP 126/68 | HR 68 | Wt 225.0 lb

## 2014-09-08 DIAGNOSIS — G4733 Obstructive sleep apnea (adult) (pediatric): Secondary | ICD-10-CM

## 2014-09-08 DIAGNOSIS — I5032 Chronic diastolic (congestive) heart failure: Secondary | ICD-10-CM

## 2014-09-08 DIAGNOSIS — R609 Edema, unspecified: Secondary | ICD-10-CM

## 2014-09-08 DIAGNOSIS — D509 Iron deficiency anemia, unspecified: Secondary | ICD-10-CM

## 2014-09-08 DIAGNOSIS — Z7901 Long term (current) use of anticoagulants: Secondary | ICD-10-CM

## 2014-09-08 DIAGNOSIS — I480 Paroxysmal atrial fibrillation: Secondary | ICD-10-CM | POA: Diagnosis not present

## 2014-09-08 DIAGNOSIS — D46Z Other myelodysplastic syndromes: Secondary | ICD-10-CM

## 2014-09-08 DIAGNOSIS — F32A Depression, unspecified: Secondary | ICD-10-CM

## 2014-09-08 DIAGNOSIS — F329 Major depressive disorder, single episode, unspecified: Secondary | ICD-10-CM | POA: Diagnosis not present

## 2014-09-08 DIAGNOSIS — E039 Hypothyroidism, unspecified: Secondary | ICD-10-CM | POA: Diagnosis not present

## 2014-09-08 DIAGNOSIS — E669 Obesity, unspecified: Secondary | ICD-10-CM

## 2014-09-08 DIAGNOSIS — I10 Essential (primary) hypertension: Secondary | ICD-10-CM

## 2014-09-08 DIAGNOSIS — E785 Hyperlipidemia, unspecified: Secondary | ICD-10-CM

## 2014-09-08 DIAGNOSIS — D4620 Refractory anemia with excess of blasts, unspecified: Secondary | ICD-10-CM

## 2014-09-08 DIAGNOSIS — Z9989 Dependence on other enabling machines and devices: Secondary | ICD-10-CM

## 2014-09-08 DIAGNOSIS — G250 Essential tremor: Secondary | ICD-10-CM

## 2014-09-08 NOTE — Progress Notes (Signed)
Patient ID: Cindy Cervantes, female   DOB: 07-29-1932, 80 y.o.   MRN: [id number]    [place] Room Number: 185  Place of Service: Clinic (12)    Allergies  Allergen Reactions   Codeine     Extreme lethargy   Iron     By infusion   Morphine    Sulfa Antibiotics    Toviaz [Fesoterodine Fumarate]     Chief Complaint  Patient presents with   Medical Management of Chronic Issues    A-Fib, thyroid, CHF, depression, blood pressure    HPI:  Paroxysmal atrial fibrillation: controlled rate  Anemia, iron deficiency; stable  MDS (myelodysplastic syndrome), low grade: mild anemia  Chronic diastolic heart failure: compensated  Depression: improved  Edema: trace  Long term current use of anticoagulant: therapeutic  Hypothyroidism, unspecified hypothyroidism type: TSH 5.3 when checked in June 2015  Tremor, essential: she thinks it is getting worse.  Obesity: weight stable at 225 since Sept 2015.  Back pain bad last week, but is better now.    Medications: Patient's Medications  New Prescriptions   No medications on file  Previous Medications   ACETAMINOPHEN (TYLENOL) 325 MG TABLET    Take 650 mg by mouth every 4 (four) hours as needed.    ALOE-SODIUM CHLORIDE (AYR SALINE NASAL GEL NA)    Place into the nose. Apply topically to each nostril four times daily as needed for soreness and dryness   AMIODARONE (PACERONE) 200 MG TABLET    Take 100 mg by mouth daily.    DEXTROMETHORPHAN-GUAIFENESIN (ROBITUSSIN DM) 10-100 MG/5ML LIQUID    Take 5 mLs by mouth. 10cc every 4 hours as needed for cough   EZETIMIBE (ZETIA) 10 MG TABLET    Take 10 mg by mouth daily.   FLUTICASONE (FLONASE) 50 MCG/ACT NASAL SPRAY    Place 2 sprays into the nose 2 (two) times daily. 2 sprays in each nostril   HYDROCORTISONE CREAM 1 %    Apply topically as needed.   LEVOTHYROXINE (SYNTHROID, LEVOTHROID) 25 MCG TABLET    Take 50 mcg by mouth daily.    LORATADINE (CLARITIN) 10 MG TABLET    Take 10 mg by mouth daily.   MAGNESIUM 400 MG CAPS    Take by mouth. Take one tablet daily   METRONIDAZOLE (METROGEL) 1 % GEL    Apply topically. Apply to rash on nose once daily   NIACIN (NIASPAN) 500 MG CR TABLET    Take 1,000 mg by mouth at bedtime.    NON FORMULARY    1.5-2 L/min. Oxygen   POLYETHYLENE GLYCOL POWDER (MIRALAX) POWDER    Take 17 g by mouth daily. As directed   POTASSIUM CHLORIDE SA (K-DUR,KLOR-CON) 20 MEQ TABLET    Take 20 mEq by mouth daily.    SODIUM CHLORIDE (OCEAN) 0.65 % SOLN NASAL SPRAY    1 spray. One spray into nostril every 2 hours as needed for nasal congestion   TORSEMIDE (DEMADEX) 20 MG TABLET    Take 20 mg by mouth daily. Take 60mg  daily   TRIAMCINOLONE (NASACORT AQ) 55 MCG/ACT NASAL INHALER    Place 2 sprays into the nose daily.   WARFARIN (COUMADIN) 3 MG TABLET    Take by mouth daily at 6 PM. Take 4  mg Sat, Sun, Tu, Thur; 3 mg M, W, F  Modified Medications   No medications on file  Discontinued Medications   No medications on file  Review of Systems  Constitutional: Positive for fatigue. Negative for fever, chills, diaphoresis, activity change, appetite change and unexpected weight change.  HENT: Negative.   Eyes: Negative.   Respiratory: Positive for shortness of breath.        Oxygen dependent  Cardiovascular: Positive for palpitations and leg swelling. Negative for chest pain.       Pacemaker.  Gastrointestinal:       Previous problems with reflux are under control  Endocrine: Negative.   Genitourinary:       Urinary dribbling. Stress incontinence.  Musculoskeletal: Positive for myalgias, arthralgias and gait problem.  Skin: Negative.   Allergic/Immunologic: Negative.   Neurological: Negative.  Negative for dizziness, tremors, seizures, speech difficulty, weakness, numbness and headaches.  Hematological:       Chronic anemia.  Psychiatric/Behavioral: Negative.     Filed Vitals:   09/08/14 1328    BP: 126/68  Pulse: 68  Weight: 225 lb (102.059 kg)  SpO2: 92%   Body mass index is 39.87 kg/(m^2).  Physical Exam  Constitutional: She is oriented to person, place, and time. No distress.  Overweight  HENT:  Head: Normocephalic.  Right Ear: External ear normal.  Left Ear: External ear normal.  Nose: Nose normal.  Mouth/Throat: Oropharynx is clear and moist. No oropharyngeal exudate.  Eyes: Conjunctivae and EOM are normal. Pupils are equal, round, and reactive to light.  Neck: No JVD present. No tracheal deviation present. No thyromegaly present.  Cardiovascular: Normal rate, regular rhythm, normal heart sounds and intact distal pulses.  Exam reveals no gallop and no friction rub.   No murmur heard. Pacemaker left upper chest wall  Pulmonary/Chest: No respiratory distress. She has no wheezes. She has no rales. She exhibits no tenderness.  Oxygen dependent. Comfortable at rest.  Abdominal: She exhibits no distension and no mass. There is no tenderness.  Musculoskeletal: Normal range of motion. She exhibits edema. She exhibits no tenderness.  Intact vibratory sensation  Lymphadenopathy:    She has no cervical adenopathy.  Neurological: She is alert and oriented to person, place, and time. She has normal reflexes. No cranial nerve deficit. Coordination normal.  Mild tremor at rest.  Skin: No rash noted. No erythema. No pallor.  Psychiatric: She has a normal mood and affect. Her behavior is normal. Judgment and thought content normal.     Labs reviewed: Nursing Home on 08/20/2014  Component Date Value Ref Range Status   INR 08/20/2014 2.7* .9 - 1.1 Final  Clinical Support on 08/07/2014  Component Date Value Ref Range Status   Date Time Interrogation Session 08/07/2014 [id number]   Final   Pulse Generator Manufacturer 08/07/2014 [employer]   Final   Pulse Gen Model 08/07/2014 [address] RF   Final   Pulse Gen Serial Number 08/07/2014 [id number]   Final   RV  Sense Sensitivity 08/07/2014 2   Final   RV Adaptation Mode 08/07/2014 Fixed Pacing   Final   RA Pace Amplitude 08/07/2014 1.375   Final   RV Pace PulseWidth 08/07/2014 0.5   Final   RV Pace Amplitude 08/07/2014 2.5   Final   Lead Channel Status 08/07/2014    Final   RA Impedance 08/07/2014 410   Final   RA Amplitude 08/07/2014 0.6   Final   RA Pacing Amplitude 08/07/2014 0.375   Final   RA Pacing PulseWidth 08/07/2014 0.5   Final   Lead Channel Status 08/07/2014    Final   RV IMPEDANCE 08/07/2014 740  Final   RV Amplitude 08/07/2014 10.2   Final   RV Pacing Amplitude 08/07/2014 1   Final   RV Pacing PulseWidth 08/07/2014 0.5   Final   Battery Status 08/07/2014 MOS   Final   Battery Longevity 08/07/2014 85   Final   Battery Percent 08/07/2014 66   Final   Battery Voltage 08/07/2014 2.93   Final   Brady RA Perc Paced 08/07/2014 51   Final   Brady RV Perc Paced 08/07/2014 1   Final   Brady AP VP Percent 08/07/2014 1   Final   Brady AS VP Percent 08/07/2014 1   Final   Brady AP VS Percent 08/07/2014 51   Final   Brady AS VS Percent 08/07/2014 48   Final   Eval Rhythm 08/07/2014 SR@75    Final   Miscellaneous Comment 08/07/2014    Final                   Value:Pacemaker remote check. Device function reviewed. Impedance, sensing, auto capture thresholds consistent with previous measurements. Histograms appropriate for patient and level of activity. All other diagnostic data reviewed and is appropriate and  stable for patient. Real time/magnet EGM shows appropriate sensing and capture. 339 mode switches the longest > 5 hours in July previously noted, + coumadin.  No ventricular high rate episodes. Estimated longevity 6.6 years. Plan to follow in 3 months  remotely, to see in office annually.  ROV 11/04/14@ !:45pm with Dr. [name].   Nursing Home on 07/30/2014  Component Date Value Ref Range Status   INR 07/30/2014 2.6* .9 - 1.1 Final  Nursing Home on  07/16/2014  Component Date Value Ref Range Status   INR 07/16/2014 1.9* 0.9 - 1.1 Final  Nursing Home on 07/02/2014  Component Date Value Ref Range Status   INR 07/02/2014 1.4* 0.9 - 1.1 Final  Nursing Home on 06/18/2014  Component Date Value Ref Range Status   INR 06/18/2014 2.4* 0.9 - 1.1 Final  Nursing Home on 06/11/2014  Component Date Value Ref Range Status   INR 06/11/2014 3.8* 0.9 - 1.1 Final     Assessment/Plan  1. Paroxysmal atrial fibrillation controlled  2. Anemia, iron deficiency Last hgb 10.4 -CBC, Retic, future  3. MDS (myelodysplastic syndrome), low grade Last hgb 10.4, MCV 86  4. Chronic diastolic heart failure compensated  5. Depression improved  6. Edema improved  7. Long term current use of anticoagulant therapeutic  8. Hypothyroidism, unspecified hypothyroidism type -TSH, future  9. Tremor, essential Observe.  10. Obesity Stressed weight loss  11. Hyperlipidemia -DC Niacin. Continue Zetia -LIPID PANEL, FUTURE  12. Essential hypertension controlled  13. OSA on CPAP contiinue CPAP  dISCONTINUED mAGNESIUM. rECHECK LEVEL.

## 2014-09-09 DIAGNOSIS — E785 Hyperlipidemia, unspecified: Secondary | ICD-10-CM | POA: Diagnosis not present

## 2014-09-09 DIAGNOSIS — D649 Anemia, unspecified: Secondary | ICD-10-CM | POA: Diagnosis not present

## 2014-09-09 DIAGNOSIS — I10 Essential (primary) hypertension: Secondary | ICD-10-CM | POA: Diagnosis not present

## 2014-09-09 LAB — HEPATIC FUNCTION PANEL
ALK PHOS: 76 U/L (ref 25–125)
ALT: 13 U/L (ref 7–35)
AST: 15 U/L (ref 13–35)
Bilirubin, Total: 0.5 mg/dL

## 2014-09-09 LAB — LIPID PANEL
CHOLESTEROL: 224 mg/dL — AB (ref 0–200)
HDL: 61 mg/dL (ref 35–70)
LDL CALC: 144 mg/dL
LDl/HDL Ratio: 3.7
Triglycerides: 97 mg/dL (ref 40–160)

## 2014-09-09 LAB — CBC AND DIFFERENTIAL
HCT: 35 % — AB (ref 36–46)
HEMOGLOBIN: 11.6 g/dL — AB (ref 12.0–16.0)
Platelets: 200 10*3/uL (ref 150–399)
WBC: 6.7 10^3/mL

## 2014-09-09 LAB — BASIC METABOLIC PANEL
BUN: 25 mg/dL — AB (ref 4–21)
Creatinine: 1.6 mg/dL — AB (ref 0.5–1.1)
Glucose: 88 mg/dL
POTASSIUM: 3 mmol/L — AB (ref 3.4–5.3)
SODIUM: 145 mmol/L (ref 137–147)

## 2014-09-09 LAB — TSH: TSH: 6.57 u[IU]/mL — AB (ref 0.41–5.90)

## 2014-09-10 ENCOUNTER — Other Ambulatory Visit: Payer: Self-pay

## 2014-09-17 ENCOUNTER — Encounter: Payer: Self-pay | Admitting: Nurse Practitioner

## 2014-09-17 ENCOUNTER — Non-Acute Institutional Stay: Payer: Medicare Other | Admitting: Nurse Practitioner

## 2014-09-17 VITALS — BP 152/68 | HR 83 | Wt 225.0 lb

## 2014-09-17 DIAGNOSIS — Z7901 Long term (current) use of anticoagulants: Secondary | ICD-10-CM | POA: Diagnosis not present

## 2014-09-17 DIAGNOSIS — I480 Paroxysmal atrial fibrillation: Secondary | ICD-10-CM | POA: Diagnosis not present

## 2014-09-17 LAB — POCT INR: INR: 2.9 — AB (ref <=1.1)

## 2014-09-17 MED ORDER — RIVAROXABAN 15 MG PO TABS: 15.0000 mg | ORAL_TABLET | Freq: Every day | ORAL | Status: AC

## 2014-09-17 MED ORDER — RIVAROXABAN 15 MG PO TABS: 15.0000 mg | ORAL_TABLET | Freq: Every day | ORAL | Status: DC

## 2014-09-17 NOTE — Progress Notes (Signed)
Patient ID: Cindy Cervantes, female   DOB: 1932/08/19, 80 y.o.   MRN: [id number]    Nursing Home Location:  [place] of Service: Clinic (12)  PCP: Estill Dooms, MD  Allergies  Allergen Reactions  . Codeine     Extreme lethargy  . Iron     By infusion  . Morphine   . Lisbeth Ply [Fesoterodine Fumarate]     Chief Complaint  Patient presents with  . Medical Management of Chronic Issues    coag check     HPI:  This is a 80 y.o. female resident of [place], [place] here today for coagulation management. Pt saw Dr Nyoka Cowden for routine folow up last week. Since last visit patient has continued taking warfarin 4 mg Saturday Sunday Tuesday and Thursday with 3 mg coumadin on Monday, Wednesday, Friday as prescribed, no new medications or changes in diet. No missed doses. No bleeding or bruising noted. No palpations or chest pains.  Reports episode of GERD last night, ate a greasy lunch which was most likely cause.   Review of Systems:  Review of Systems  Constitutional: Negative for activity change, appetite change, fatigue and unexpected weight change.  HENT: Negative for congestion, hearing loss, nosebleeds and postnasal drip.   Eyes: Negative.   Respiratory: Negative for shortness of breath. Cough: worse in the morning, always resolved after breakfast.        Wears O2 only as needed now.   Cardiovascular: Negative for chest pain, palpitations and leg swelling.  Gastrointestinal: Positive for abdominal pain (last night, resolved) and constipation (controlled on medication). Negative for diarrhea and blood in stool.  Genitourinary: Negative for dysuria, hematuria, vaginal bleeding and difficulty urinating.  Musculoskeletal: Negative for myalgias and arthralgias.  Skin: Negative for color change and wound.  Allergic/Immunologic: Positive for environmental allergies.  Neurological: Negative for dizziness and weakness.    Hematological: Does not bruise/bleed easily.    Past Medical History  Diagnosis Date  . Paroxysmal a-fib with RVR    Amiodarone  . History of hyperkalemia in setting of spironolactone 09/2010  . Sinus node dysfunction   . Pacemaker     Implanted December 2012  . Obesity   . Allergic rhinitis   . Hypertension   . Senile cataract   . Hyperlipidemia   . Xerostomia   . Dysphagia   . Cough     2nd to reflux  . OSA (obstructive sleep apnea)   . Diverticulosis of colon   . Obesity hypoventilation syndrome   . Colon polyp   . Tracheobronchomalacia   . Diaphragm dysfunction     Right hemidiaphragm  . Anemia of renal disease 10/21/2011  . Anemia, iron deficiency 10/21/2011  . MDS (myelodysplastic syndrome), low grade 10/21/2011  . Unspecified vitamin D deficiency   . Dehydration   . Hypopotassemia   . Other specified disease of white blood cells   . Chronic diastolic heart failure   . Reflux esophagitis   . Cholelithiasis   . Osteoarthrosis, unspecified whether generalized or localized, unspecified site   . Disorder of bone and cartilage, unspecified   . Urinary incontinence   . Debility, unspecified   . Long term (current) use of anticoagulants   . Hypothyroidism 02/08/2013  . GERD (gastroesophageal reflux disease) 02/08/2013  . Depression   . Constipation   . Rosacea 05/15/2013   Past Surgical History  Procedure Laterality Date  . Tubal ligation  1964  . Total knee  arthroplasty  1999    right knee  . Tummy tuck  2000    pt "almost died"; respiratory distress, became delrious  . Total knee arthroplasty  2003    left knee  . Cataract extraction  2008  . Pacemaker placement  10/21/2010    dural chamber Dr. [name]   Social History:   reports that she quit smoking about 31 years ago. Her smoking use included Cigarettes. She smoked 0.50 packs per day. She has never used smokeless tobacco. She reports that she drinks about 0.6 oz of alcohol per week. She reports that she does  not use illicit drugs.  Family History  Problem Relation Age of Onset  . Heart disease Father   . Heart disease Brother     Medications: Patient's Medications  New Prescriptions   No medications on file  Previous Medications   ACETAMINOPHEN (TYLENOL) 325 MG TABLET    Take 650 mg by mouth every 4 (four) hours as needed.    ALOE-SODIUM CHLORIDE (AYR SALINE NASAL GEL NA)    Place into the nose. Apply topically to each nostril four times daily as needed for soreness and dryness   AMIODARONE (PACERONE) 200 MG TABLET    Take 100 mg by mouth daily.    DEXTROMETHORPHAN-GUAIFENESIN (ROBITUSSIN DM) 10-100 MG/5ML LIQUID    Take 5 mLs by mouth. 10cc every 4 hours as needed for cough   EZETIMIBE (ZETIA) 10 MG TABLET    Take 10 mg by mouth daily.   FLUTICASONE (FLONASE) 50 MCG/ACT NASAL SPRAY    Place 2 sprays into the nose 2 (two) times daily. 2 sprays in each nostril   HYDROCORTISONE CREAM 1 %    Apply topically as needed.   LEVOTHYROXINE (SYNTHROID, LEVOTHROID) 25 MCG TABLET    Take 50 mcg by mouth daily.    LORATADINE (CLARITIN) 10 MG TABLET    Take 10 mg by mouth daily.   METRONIDAZOLE (METROGEL) 1 % GEL    Apply topically. Apply to rash on nose once daily   NON FORMULARY    1.5-2 L/min. Oxygen   POLYETHYLENE GLYCOL POWDER (MIRALAX) POWDER    Take 17 g by mouth daily. As directed   POTASSIUM CHLORIDE SA (K-DUR,KLOR-CON) 20 MEQ TABLET    Take 20 mEq by mouth daily.    SODIUM CHLORIDE (OCEAN) 0.65 % SOLN NASAL SPRAY    1 spray. One spray into nostril every 2 hours as needed for nasal congestion   TORSEMIDE (DEMADEX) 20 MG TABLET    Take 20 mg by mouth daily. Take 60mg  daily   TRIAMCINOLONE (NASACORT AQ) 55 MCG/ACT NASAL INHALER    Place 2 sprays into the nose daily.   WARFARIN (COUMADIN) 3 MG TABLET    Take by mouth daily at 6 PM. Take 4  mg Sat, Sun, Tu, Thur; 3 mg M, W, F  Modified Medications   No medications on file  Discontinued Medications   MAGNESIUM OXIDE (MAG-OX) 400 MG TABLET    Take  400 mg by mouth daily.   NIACIN 500 MG TABLET    Take 500 mg by mouth. Take 1,000 in morning     Physical Exam: Filed Vitals:   09/17/14 0936  BP: 152/68  Pulse: 83  Weight: 225 lb (102.059 kg)  SpO2: 98%    Physical Exam  Constitutional: She is oriented to person, place, and time. She appears well-developed and well-nourished. No distress.  Neck: Normal range of motion. Neck supple.  Cardiovascular: Normal rate,  regular rhythm and normal heart sounds.   Pulmonary/Chest: Effort normal and breath sounds normal.  Abdominal: Soft. Bowel sounds are normal.  Musculoskeletal: She exhibits no edema.  Neurological: She is alert and oriented to person, place, and time.  Skin: Skin is warm and dry. She is not diaphoretic.  Psychiatric: She has a normal mood and affect.    Labs reviewed: 12/06/12 total cholesterol 198, triglyceride 88, HDL 57, LDL 147.  01/01/2013 TSH 3.365  04/01/14 TSH 9.509   Basic Metabolic Panel:  Recent Labs  10/15/13 05/27/14 09/09/14  NA 145 141 145  K 3.6 3.7 3.0*  BUN 24* 21 25*  CREATININE 1.6* 1.6* 1.6*   Liver Function Tests:  Recent Labs  10/15/13 05/27/14 09/09/14  AST  --  13 15  ALT 9 10 13   ALKPHOS 69 72 76   No results for input(s): LIPASE, AMYLASE in the last 8760 hours. No results for input(s): AMMONIA in the last 8760 hours. CBC:  Recent Labs  10/15/13 05/27/14 09/09/14  WBC 7.1 6.9 6.7  HGB 11.0* 10.4* 11.6*  HCT 33* 31* 35*  PLT 171 173 200   TSH:  Recent Labs  10/15/13 09/09/14  TSH 6.47* 6.57*   A1C: Lab Results  Component Value Date   HGBA1C * 09/07/2010    6.7 (NOTE)                                                                       According to the ADA Clinical Practice Recommendations for 2011, when HbA1c is used as a screening test:   >=6.5%   Diagnostic of Diabetes Mellitus           (if abnormal result  is confirmed)  5.7-6.4%   Increased risk of developing Diabetes Mellitus  References:Diagnosis and  Classification of Diabetes Mellitus,Diabetes TOIZ,1245,80(DXIPJ 1):S62-S69 and Standards of Medical Care in         Diabetes - 2011,Diabetes Care,2011,34  (Suppl 1):S11-S61.   Lipid Panel:  Recent Labs  10/15/13 09/09/14  CHOL 198 224*  HDL  --  61  LDLCALC 122 144  TRIG 100 97   INR Lab Results  Component Value Date   INR 2.9* 09/17/2014   INR 2.7* 08/20/2014   INR 2.6* 07/30/2014     Assessment/Plan  1. Paroxysmal atrial fibrillation -remains in SR and rate controlled, to cont current medication including anticoagulant   2. Long term current use of anticoagulant -INR has been trending up. INR 2.9 today will stop coumadin and start xarelto 15 mg (GFR 31) -will follow up BMP and CBC prior to next visit  Has follow up schedule with Dr [name] in April to keep this appt

## 2014-11-04 ENCOUNTER — Ambulatory Visit (INDEPENDENT_AMBULATORY_CARE_PROVIDER_SITE_OTHER): Payer: Medicare Other | Admitting: Internal Medicine

## 2014-11-04 ENCOUNTER — Encounter: Payer: Self-pay | Admitting: Internal Medicine

## 2014-11-04 VITALS — BP 148/66 | HR 80 | Ht 64.0 in | Wt 225.2 lb

## 2014-11-04 DIAGNOSIS — I480 Paroxysmal atrial fibrillation: Secondary | ICD-10-CM

## 2014-11-04 DIAGNOSIS — I495 Sick sinus syndrome: Secondary | ICD-10-CM | POA: Diagnosis not present

## 2014-11-04 DIAGNOSIS — E876 Hypokalemia: Secondary | ICD-10-CM

## 2014-11-04 DIAGNOSIS — E039 Hypothyroidism, unspecified: Secondary | ICD-10-CM

## 2014-11-04 DIAGNOSIS — Z45018 Encounter for adjustment and management of other part of cardiac pacemaker: Secondary | ICD-10-CM

## 2014-11-04 DIAGNOSIS — E785 Hyperlipidemia, unspecified: Secondary | ICD-10-CM | POA: Diagnosis not present

## 2014-11-04 DIAGNOSIS — D649 Anemia, unspecified: Secondary | ICD-10-CM | POA: Diagnosis not present

## 2014-11-04 LAB — MDC_IDC_ENUM_SESS_TYPE_INCLINIC
Brady Statistic RA Percent Paced: 50 %
Date Time Interrogation Session: 20160202141519
Implantable Pulse Generator Serial Number: 7198677
Lead Channel Impedance Value: 387.5 Ohm
Lead Channel Impedance Value: 787.5 Ohm
Lead Channel Pacing Threshold Amplitude: 0.5 V
Lead Channel Sensing Intrinsic Amplitude: 1.4 mV
Lead Channel Sensing Intrinsic Amplitude: 12 mV
Lead Channel Setting Pacing Amplitude: 1.5 V
Lead Channel Setting Pacing Amplitude: 2.5 V
MDC IDC MSMT BATTERY REMAINING LONGEVITY: 109.2 mo
MDC IDC MSMT BATTERY VOLTAGE: 2.93 V
MDC IDC MSMT LEADCHNL RA PACING THRESHOLD PULSEWIDTH: 0.5 ms
MDC IDC MSMT LEADCHNL RV PACING THRESHOLD AMPLITUDE: 0.75 V
MDC IDC MSMT LEADCHNL RV PACING THRESHOLD PULSEWIDTH: 0.5 ms
MDC IDC SET LEADCHNL RV PACING PULSEWIDTH: 0.5 ms
MDC IDC SET LEADCHNL RV SENSING SENSITIVITY: 2 mV
MDC IDC STAT BRADY RV PERCENT PACED: 0.81 %

## 2014-11-04 NOTE — Patient Instructions (Signed)

## 2014-11-04 NOTE — Progress Notes (Signed)
Patient Care Team: Gayland Curry, DO as PCP - General (Geriatric Medicine) Mardene Celeste, NP as Nurse Practitioner (Geriatric Medicine) [facility] Chesley Mires, MD as Consulting Physician (Pulmonary Disease) Larey Dresser, MD as Consulting Physician (Cardiology) Lafayette Dragon, MD as Consulting Physician (Gastroenterology) Volanda Napoleon, MD as Consulting Physician (Oncology) Ninetta Lights, MD as Consulting Physician (Orthopedic Surgery) Sydnee Levans, MD as Consulting Physician (Dermatology) Trixie Rude., MD as Consulting Physician (Ophthalmology)   HPI  Cindy Cervantes is a 81 y.o. female followup for her paroxysmal atrial fibrillation with a rapid ventricular response and sinus node dysfunction for which she required pacemaker implantation in the fall of 2011.  She takes amiodarone. She has tolerated this quite well.  .  She is using chronic oxygen. She saw a pulmonary recently and now has a hand held oxygen concentrator which she loves;  she continues to struggle with an a.m. cough.  Were resumed weeks if she's had a significant problem with dyspnea and tachypalpitations with modest exertion. She has not had concomitant edema.     There has been mo intercurrent atrial fibrillation   Past Medical History  Diagnosis Date  . Paroxysmal a-fib with RVR    Amiodarone  . History of hyperkalemia in setting of spironolactone 09/2010  . Sinus node dysfunction   . Pacemaker     Implanted December 2012  . Obesity   . Allergic rhinitis   . Hypertension   . Senile cataract   . Hyperlipidemia   . Xerostomia   . Dysphagia   . Cough     2nd to reflux  . OSA (obstructive sleep apnea)   . Diverticulosis of colon   . Obesity hypoventilation syndrome   . Colon polyp   . Tracheobronchomalacia   . Diaphragm dysfunction     Right hemidiaphragm  . Anemia of renal disease 10/21/2011  . Anemia, iron deficiency 10/21/2011  . MDS  (myelodysplastic syndrome), low grade 10/21/2011  . Unspecified vitamin D deficiency   . Dehydration   . Hypopotassemia   . Other specified disease of white blood cells   . Chronic diastolic heart failure   . Reflux esophagitis   . Cholelithiasis   . Osteoarthrosis, unspecified whether generalized or localized, unspecified site   . Disorder of bone and cartilage, unspecified   . Urinary incontinence   . Debility, unspecified   . Long term (current) use of anticoagulants   . Hypothyroidism 02/08/2013  . GERD (gastroesophageal reflux disease) 02/08/2013  . Depression   . Constipation   . Rosacea 05/15/2013    Past Surgical History  Procedure Laterality Date  . Tubal ligation  1964  . Total knee arthroplasty  1999    right knee  . Tummy tuck  2000    pt "almost died"; respiratory distress, became delrious  . Total knee arthroplasty  2003    left knee  . Cataract extraction  2008  . Pacemaker placement  10/21/2010    dural chamber Dr. [name]    Current Outpatient Prescriptions  Medication Sig Dispense Refill  . acetaminophen (TYLENOL) 325 MG tablet Take 650 mg by mouth every 4 (four) hours as needed.     . Aloe-Sodium Chloride (AYR SALINE NASAL GEL NA) Place into the nose. Apply topically to each nostril four times daily as needed for soreness and dryness    . amiodarone (PACERONE) 200 MG tablet Take 100 mg by mouth daily.     Marland Kitchen  Dextromethorphan-Guaifenesin (ROBITUSSIN DM) 10-100 MG/5ML liquid Take 5 mLs by mouth. 10cc every 4 hours as needed for cough    . ezetimibe (ZETIA) 10 MG tablet Take 10 mg by mouth daily.    . fluticasone (FLONASE) 50 MCG/ACT nasal spray Place 2 sprays into the nose 2 (two) times daily. 2 sprays in each nostril    . hydrocortisone cream 1 % Apply topically as needed.    . Levothyroxine Sodium 75 MCG CAPS Take 1 mcg by mouth daily before breakfast.    . loratadine (CLARITIN) 10 MG tablet Take 10 mg by mouth daily.    . metroNIDAZOLE (METROGEL) 1 % gel Apply  topically. Apply to rash on nose once daily    . NON FORMULARY 1.5-2 L/min. Oxygen    . polyethylene glycol powder (MIRALAX) powder Take 17 g by mouth daily. As directed    . potassium chloride SA (K-DUR,KLOR-CON) 20 MEQ tablet Take 20 mEq by mouth daily.     . Rivaroxaban (XARELTO) 15 MG TABS tablet Take 1 tablet (15 mg total) by mouth daily with supper. 30 tablet   . sodium chloride (OCEAN) 0.65 % SOLN nasal spray 1 spray. One spray into nostril every 2 hours as needed for nasal congestion    . torsemide (DEMADEX) 20 MG tablet Take 20 mg by mouth daily.     Marland Kitchen triamcinolone (NASACORT AQ) 55 MCG/ACT nasal inhaler Place 2 sprays into the nose daily. 1 Inhaler 12   No current facility-administered medications for this visit.    Allergies  Allergen Reactions  . Codeine     Extreme lethargy  . Iron     By infusion  . Morphine   . Toviaz [Fesoterodine Fumarate]     Review of Systems negative except from HPI and PMH  Physical Exam BP 148/66 mmHg  Pulse 80  Ht 5\' 4"  (1.626 m)  Wt 225 lb 3.2 oz (102.15 kg)  BMI 38.64 kg/m2 Well developed and well nourished using oxygen concentrator HENT normal E scleral and icterus clear Neck Supple JVP flat; carotids brisk and full Clear to ausculation Device pocket well healed; without hematoma or erythema.  There is no tetheringRegular rate and rhythm, no murmurs gallops or rub Soft with active bowel sounds No clubbing cyanosis no  Edema Alert and oriented, grossly normal motor and sensory function Skin Warm and Dry  ECG demonstrates sinus rhythm at 75 Intervals 15/10/41 Low-voltage  Assessment and  Plan  Pacemaker-[employer] The patient's device was interrogated.  The information was reviewed. No changes were made in the programming.    Atrial fibrillation-paroxysmal  HFpEF  Hypokalemia  Treated hypothyroidism  No intercurrent atrial fibrillation or flutter  Her last TSH was elevated; apparently her dose is intercurrently been  increased and a repeat TSH ordered  I trust also that her potassium level has been rechecked.Marland Kitchen

## 2014-11-05 ENCOUNTER — Encounter: Payer: Self-pay | Admitting: Internal Medicine

## 2015-01-08 DIAGNOSIS — I10 Essential (primary) hypertension: Secondary | ICD-10-CM | POA: Diagnosis not present

## 2015-01-08 DIAGNOSIS — D649 Anemia, unspecified: Secondary | ICD-10-CM | POA: Diagnosis not present

## 2015-01-08 DIAGNOSIS — E039 Hypothyroidism, unspecified: Secondary | ICD-10-CM | POA: Diagnosis not present

## 2015-01-08 LAB — BASIC METABOLIC PANEL
BUN: 26 mg/dL — AB (ref 4–21)
Creatinine: 1.7 mg/dL — AB (ref 0.5–1.1)
GLUCOSE: 91 mg/dL
Potassium: 4.1 mmol/L (ref 3.4–5.3)
SODIUM: 144 mmol/L (ref 137–147)

## 2015-01-08 LAB — CBC AND DIFFERENTIAL
HEMATOCRIT: 34 % — AB (ref 36–46)
Hemoglobin: 11.1 g/dL — AB (ref 12.0–16.0)
PLATELETS: 188 10*3/uL (ref 150–399)
WBC: 6.5 10*3/mL

## 2015-01-13 ENCOUNTER — Non-Acute Institutional Stay: Payer: Medicare Other | Admitting: Internal Medicine

## 2015-01-13 ENCOUNTER — Encounter: Payer: Self-pay | Admitting: Internal Medicine

## 2015-01-13 ENCOUNTER — Telehealth: Payer: Self-pay | Admitting: *Deleted

## 2015-01-13 VITALS — BP 142/70 | HR 68 | Temp 98.0°F | Wt 225.0 lb

## 2015-01-13 DIAGNOSIS — Z23 Encounter for immunization: Secondary | ICD-10-CM

## 2015-01-13 DIAGNOSIS — R739 Hyperglycemia, unspecified: Secondary | ICD-10-CM

## 2015-01-13 DIAGNOSIS — L905 Scar conditions and fibrosis of skin: Secondary | ICD-10-CM

## 2015-01-13 DIAGNOSIS — D46Z Other myelodysplastic syndromes: Secondary | ICD-10-CM | POA: Diagnosis not present

## 2015-01-13 DIAGNOSIS — E039 Hypothyroidism, unspecified: Secondary | ICD-10-CM | POA: Diagnosis not present

## 2015-01-13 DIAGNOSIS — E785 Hyperlipidemia, unspecified: Secondary | ICD-10-CM

## 2015-01-13 DIAGNOSIS — I480 Paroxysmal atrial fibrillation: Secondary | ICD-10-CM | POA: Diagnosis not present

## 2015-01-13 DIAGNOSIS — I5032 Chronic diastolic (congestive) heart failure: Secondary | ICD-10-CM

## 2015-01-13 DIAGNOSIS — D4620 Refractory anemia with excess of blasts, unspecified: Secondary | ICD-10-CM

## 2015-01-13 NOTE — Progress Notes (Signed)
Patient ID: Cindy Cervantes, female   DOB: 10-13-1931, 81 y.o.   MRN: [id number]   Location:  [facility]  Code Status: full code  Goals of Care: Advanced Directive information Does patient have an advance directive?: Yes, Type of Advance Directive: Living will;Healthcare Power of Attorney, Does patient want to make changes to advanced directive?: No - Patient declined   Allergies  Allergen Reactions  . Codeine     Extreme lethargy  . Iron     By infusion  . Morphine   . Lisbeth Ply [Fesoterodine Fumarate]     Chief Complaint  Patient presents with  . Medical Management of Chronic Issues    A-Fib, thyroid, CHF, blood pressure, depression  . Sore    on right buttocks, has had it off and on times 4 years    HPI: Patient is a 81 y.o. white female [place] resident seen in the [facility] today for med mgt of her chronic diseases.  Doing well other than a sore on her butt.  4 years ago was in hospital and sore place began then.  Has incontinence and didn't always get changed.  Tried to take care of it in rehab, and has come and gone since.  Hurts when sits or if rocks back and forth in seat feels like it's being pinched.  Has been using neosporin or vaseline on it.  Had tried powder but hard to get it on there.  Also tried desitin which only made a mess.    Lives in [place] for 4 years in May.  She is independent aside from medication mgt and housework in her room.  Had been very sick after her rehab stay and needed O2.  Off O2 now since Labor Day.  Does her own showers.    Asked about her thyroid.  [facility] about dry skin, constipation, achy joints.  Has to use miralax.    Plays bridge a couple of times per week.    Breathing well.  Does not note any palpitations.  Uses bipap at night.  Has pacer check machine.    Some difficulty with gas pains under her ribs--trying not to eat greasy foods.  Thinks she might eat too much sometimes.    Mood is good now.  Usually always in  a good mood.  No notable memory trouble--occasional word-finding trouble only.    Review of Systems:  Review of Systems  Constitutional: Negative for fever and chills.  HENT: Negative for congestion.   Eyes: Negative for blurred vision.  Respiratory: Negative for shortness of breath.   Cardiovascular: Negative for chest pain and leg swelling.  Gastrointestinal: Positive for abdominal pain.       In ribs if eats too much  Genitourinary: Negative for dysuria.       Urinary incontinence  Musculoskeletal: Negative for falls.  Skin: Negative for rash.  Neurological: Negative for dizziness.  Endo/Heme/Allergies: Bruises/bleeds easily.  Psychiatric/Behavioral: Negative for memory loss.       Admits to some difficulty finding words, but memory seems good     Past Medical History  Diagnosis Date  . Paroxysmal a-fib with RVR    Amiodarone  . History of hyperkalemia in setting of spironolactone 09/2010  . Sinus node dysfunction   . Pacemaker     Implanted December 2012  . Obesity   . Allergic rhinitis   . Hypertension   . Senile cataract   . Hyperlipidemia   . Xerostomia   .  Dysphagia   . Cough     2nd to reflux  . OSA (obstructive sleep apnea)   . Diverticulosis of colon   . Obesity hypoventilation syndrome   . Colon polyp   . Tracheobronchomalacia   . Diaphragm dysfunction     Right hemidiaphragm  . Anemia of renal disease 10/21/2011  . Anemia, iron deficiency 10/21/2011  . MDS (myelodysplastic syndrome), low grade 10/21/2011  . Unspecified vitamin D deficiency   . Dehydration   . Hypopotassemia   . Other specified disease of white blood cells   . Chronic diastolic heart failure   . Reflux esophagitis   . Cholelithiasis   . Osteoarthrosis, unspecified whether generalized or localized, unspecified site   . Disorder of bone and cartilage, unspecified   . Urinary incontinence   . Debility, unspecified   . Long term (current) use of anticoagulants   . Hypothyroidism  02/08/2013  . GERD (gastroesophageal reflux disease) 02/08/2013  . Depression   . Constipation   . Rosacea 05/15/2013    Past Surgical History  Procedure Laterality Date  . Tubal ligation  1964  . Total knee arthroplasty  1999    right knee  . Tummy tuck  2000    pt "almost died"; respiratory distress, became delrious  . Total knee arthroplasty  2003    left knee  . Cataract extraction  2008  . Pacemaker placement  10/21/2010    dural chamber Dr. [name]    Social History:   reports that she quit smoking about 32 years ago. Her smoking use included Cigarettes. She smoked 0.50 packs per day. She has never used smokeless tobacco. She reports that she drinks about 0.6 oz of alcohol per week. She reports that she does not use illicit drugs.  Family History  Problem Relation Age of Onset  . Heart disease Father   . Heart disease Brother     Medications: Patient's Medications  New Prescriptions   No medications on file  Previous Medications   ACETAMINOPHEN (TYLENOL) 325 MG TABLET    Take 650 mg by mouth every 4 (four) hours as needed.    ALOE-SODIUM CHLORIDE (AYR SALINE NASAL GEL NA)    Place into the nose. Apply topically to each nostril four times daily as needed for soreness and dryness   AMIODARONE (PACERONE) 200 MG TABLET    Take 100 mg by mouth daily.    DEXTROMETHORPHAN-GUAIFENESIN (ROBITUSSIN DM) 10-100 MG/5ML LIQUID    Take 5 mLs by mouth. 10cc every 4 hours as needed for cough   EZETIMIBE (ZETIA) 10 MG TABLET    Take 10 mg by mouth daily.   FLUTICASONE (FLONASE) 50 MCG/ACT NASAL SPRAY    Place 2 sprays into the nose 2 (two) times daily. 2 sprays in each nostril   HYDROCORTISONE CREAM 1 %    Apply topically as needed.   LEVOTHYROXINE SODIUM 75 MCG CAPS    Take 1 mcg by mouth daily before breakfast.   LORATADINE (CLARITIN) 10 MG TABLET    Take 10 mg by mouth daily.   METRONIDAZOLE (METROGEL) 1 % GEL    Apply topically. Apply to rash on nose once daily   NON FORMULARY    1.5-2  L/min. Oxygen   POLYETHYLENE GLYCOL POWDER (MIRALAX) POWDER    Take 17 g by mouth daily. As directed   POTASSIUM CHLORIDE SA (K-DUR,KLOR-CON) 20 MEQ TABLET    Take 20 mEq by mouth daily.    RIVAROXABAN (XARELTO) 15 MG TABS TABLET  Take 1 tablet (15 mg total) by mouth daily with supper.   SODIUM CHLORIDE (OCEAN) 0.65 % SOLN NASAL SPRAY    1 spray. One spray into nostril every 2 hours as needed for nasal congestion   TORSEMIDE (DEMADEX) 20 MG TABLET    Take 20 mg by mouth daily.    TRIAMCINOLONE (NASACORT AQ) 55 MCG/ACT NASAL INHALER    Place 2 sprays into the nose daily.  Modified Medications   No medications on file  Discontinued Medications   No medications on file     Physical Exam: Filed Vitals:   01/13/15 1626  BP: 142/70  Pulse: 68  Temp: 98 F (36.7 C)  TempSrc: Oral  Weight: 225 lb (102.059 kg)  SpO2: 95%  Physical Exam  Constitutional: She is oriented to person, place, and time. She appears well-developed and well-nourished. No distress.  Cardiovascular: Normal rate, regular rhythm, normal heart sounds and intact distal pulses.   Pulmonary/Chest: Effort normal and breath sounds normal. No respiratory distress.  Abdominal: Soft. Bowel sounds are normal. She exhibits no distension and no mass. There is no tenderness.  Musculoskeletal: Normal range of motion.  Ambulates w/o assistive device  Neurological: She is alert and oriented to person, place, and time.  Mild word finding difficulty  Skin: Skin is warm and dry.  Psychiatric: She has a normal mood and affect. Her behavior is normal. Judgment and thought content normal.     Labs reviewed: Basic Metabolic Panel:  Recent Labs  05/27/14 09/09/14 01/08/15  NA 141 145 144  K 3.7 3.0* 4.1  BUN 21 25* 26*  CREATININE 1.6* 1.6* 1.7*  TSH  --  6.57*  --    Liver Function Tests:  Recent Labs  05/27/14 09/09/14  AST 13 15  ALT 10 13  ALKPHOS 72 76   No results for input(s): LIPASE, AMYLASE in the last 8760  hours. No results for input(s): AMMONIA in the last 8760 hours. CBC:  Recent Labs  05/27/14 09/09/14 01/08/15  WBC 6.9 6.7 6.5  HGB 10.4* 11.6* 11.1*  HCT 31* 35* 34*  PLT 173 200 188   Lipid Panel:  Recent Labs  09/09/14  CHOL 224*  HDL 61  LDLCALC 144  TRIG 97   Lab Results  Component Value Date   HGBA1C * 09/07/2010    6.7 (NOTE)                                                                       According to the ADA Clinical Practice Recommendations for 2011, when HbA1c is used as a screening test:   >=6.5%   Diagnostic of Diabetes Mellitus           (if abnormal result  is confirmed)  5.7-6.4%   Increased risk of developing Diabetes Mellitus  References:Diagnosis and Classification of Diabetes Mellitus,Diabetes INOM,7672,09(OBSJG 1):S62-S69 and Standards of Medical Care in         Diabetes - 2011,Diabetes Care,2011,34  (Suppl 1):S11-S61.     Assessment/Plan 1. Scar tissue -on right buttock area where prior pressure injury had been -advised use of barrier cream to buttocks after bathing or incontinence care -avoid prolonged pressure to prevent recurrence of breakdown she had 4 years ago  2.  Paroxysmal atrial fibrillation -rhythm controlled with amiodarone, on xarelto as anticoagulant  3. MDS (myelodysplastic syndrome), low grade -has only mild anemia at present; wbc and platelets normal -monitor  4. Chronic diastolic heart failure -no s/s of acute exacerbation -continues on demadex 20mg  daily only, not on beta blocker   5. Hypothyroidism, unspecified hypothyroidism type -cont levothyroxine 48mcg 30 mins before breakfast  6. Hyperlipidemia -cont zetia, watching diet, trying to exercise  7. Hyperglycemia -hba1c in prediabetic range, monitor  8. Need for vaccination with 13-polyvalent pneumococcal conjugate vaccine - Pneumococcal conjugate vaccine 13-valent was given  Seems she may actually be independent living appropriate to me.  Labs/tests ordered:   Already has physical--unknown if labs ordered before it  Next appt:  Keep physical with Janett Billow as scheduled for June  Hortensia Duffin L. Harveen Flesch, D.O. [facility] [address], [place] [address] Cell Phone (Mon-Fri 8am-5pm):  [phone] On Call:  [phone] & follow prompts after 5pm & weekends Office Phone:  [phone] Office Fax:  [phone]

## 2015-01-13 NOTE — Telephone Encounter (Signed)
Received fax from [facility] [phone] stating Medicare requires to have an office visit note on file within the last 12 months that indicates the patient is still using their cpap therapy. They need a copy faxed to them at Fax: [phone] Attn: Francoise Ceo. Printed OV note and faxed to [facility].

## 2015-01-24 ENCOUNTER — Encounter: Payer: Self-pay | Admitting: Internal Medicine

## 2015-02-03 ENCOUNTER — Ambulatory Visit (INDEPENDENT_AMBULATORY_CARE_PROVIDER_SITE_OTHER): Payer: Medicare Other | Admitting: *Deleted

## 2015-02-03 ENCOUNTER — Encounter: Payer: Self-pay | Admitting: Internal Medicine

## 2015-02-03 DIAGNOSIS — I495 Sick sinus syndrome: Secondary | ICD-10-CM | POA: Diagnosis not present

## 2015-02-03 NOTE — Progress Notes (Signed)
Remote pacemaker transmission.   

## 2015-02-07 LAB — CUP PACEART REMOTE DEVICE CHECK
Battery Remaining Percentage: 63 %
Battery Voltage: 2.93 V
Brady Statistic AP VP Percent: 1 %
Brady Statistic AP VS Percent: 41 %
Brady Statistic AS VP Percent: 1 %
Date Time Interrogation Session: 20160503075744
Lead Channel Impedance Value: 410 Ohm
Lead Channel Impedance Value: 730 Ohm
Lead Channel Pacing Threshold Amplitude: 0.75 V
Lead Channel Pacing Threshold Pulse Width: 0.5 ms
Lead Channel Sensing Intrinsic Amplitude: 1.1 mV
Lead Channel Sensing Intrinsic Amplitude: 12 mV
Lead Channel Setting Pacing Amplitude: 1.375
Lead Channel Setting Pacing Amplitude: 2.5 V
Lead Channel Setting Pacing Pulse Width: 0.5 ms
MDC IDC MSMT BATTERY REMAINING LONGEVITY: 82 mo
MDC IDC MSMT LEADCHNL RA PACING THRESHOLD AMPLITUDE: 0.375 V
MDC IDC MSMT LEADCHNL RA PACING THRESHOLD PULSEWIDTH: 0.5 ms
MDC IDC PG SERIAL: 7198677
MDC IDC SET LEADCHNL RV SENSING SENSITIVITY: 2 mV
MDC IDC STAT BRADY AS VS PERCENT: 59 %
MDC IDC STAT BRADY RA PERCENT PACED: 41 %
MDC IDC STAT BRADY RV PERCENT PACED: 1 %
Pulse Gen Model: 2210

## 2015-02-19 ENCOUNTER — Telehealth: Payer: Self-pay | Admitting: Internal Medicine

## 2015-02-19 DIAGNOSIS — H5203 Hypermetropia, bilateral: Secondary | ICD-10-CM | POA: Diagnosis not present

## 2015-02-19 DIAGNOSIS — H26499 Other secondary cataract, unspecified eye: Secondary | ICD-10-CM | POA: Diagnosis not present

## 2015-02-19 DIAGNOSIS — H26492 Other secondary cataract, left eye: Secondary | ICD-10-CM | POA: Diagnosis not present

## 2015-02-19 DIAGNOSIS — H52222 Regular astigmatism, left eye: Secondary | ICD-10-CM | POA: Diagnosis not present

## 2015-02-19 DIAGNOSIS — H524 Presbyopia: Secondary | ICD-10-CM | POA: Diagnosis not present

## 2015-02-19 NOTE — Telephone Encounter (Signed)
Advised that we just received the fax and that Dr. [name] was not in the office until tomorrow. Also informed them that Dr. [name] did not switch the patient but their nursing home NP changed it. Sherice stated to disregard fax and call and they would handle it there.

## 2015-02-19 NOTE — Telephone Encounter (Signed)
New Message        Office faxed a medication recommendation for this pt on 02/17/15 and haven't heard anything back from our office and needs for Dr, [name] or his nurse to contact them as soon as possible. Please call back and advise.

## 2015-02-23 ENCOUNTER — Encounter: Payer: Self-pay | Admitting: Cardiology

## 2015-02-25 ENCOUNTER — Encounter: Payer: Self-pay | Admitting: Pulmonary Disease

## 2015-02-25 ENCOUNTER — Ambulatory Visit (INDEPENDENT_AMBULATORY_CARE_PROVIDER_SITE_OTHER): Payer: Medicare Other | Admitting: Pulmonary Disease

## 2015-02-25 VITALS — BP 130/88 | HR 88 | Ht 64.0 in | Wt 219.0 lb

## 2015-02-25 DIAGNOSIS — J398 Other specified diseases of upper respiratory tract: Secondary | ICD-10-CM | POA: Diagnosis not present

## 2015-02-25 DIAGNOSIS — G4733 Obstructive sleep apnea (adult) (pediatric): Secondary | ICD-10-CM | POA: Diagnosis not present

## 2015-02-25 NOTE — Progress Notes (Signed)
  Chief Complaint  Patient presents with  . Follow-up    Wears CPAP nightly. No complaints. Only using O2 at night with CPAP.     History of Present Illness: Cindy Cervantes is a 81 y.o. female former smoker with cough 2nd to GERD, OSA/OHS, tracheobronchomalacia, and Rt hemidiaphragm elevation.  She has been doing well.  She is not using oxygen during the day anymore.  She is doing well with BiPAP.  She gets new mask supplies every few months.  She still gets winded with activity.  She is planning trip to [place] and need to get letter for her to take oxygen with her.  She has inogen POC.  Tests: PFT's March 29 2010 >> FEV1 1.34 (72) with ratio 75 and ERV 42, DLC0 55 > corrects to 137 CT chest 10/17/10>>GGO LUL, no PE, tracheobronchomalacia involving the visualized trachea with areas of air trapping seen throughout both lungs PSG 12/14/10>>AHI 17.9, REM 40.8 BPAP titraton 01/12/11>>18/13 cm H2O with 3 liters oxygen. BPAP 02/01/11 to 03/04/12>>Used on 386 of 398 nights with average 7 hrs 33 min.  Average AHI 2.7 with BPAP 16/13 cm H2O.  Minimal airleak.  Tv 460, Ve 6.1, RR 15. 03/12/12 Change to BiPAP 15/12 cm H2O 03/05/13 Change BiPAP to 14/10 cm H2O  PMHx >> PAF, s/p PM, HTN, Diastolic CHF, CKD, MDS, GERD, Hypothyroidism, Depression  PSHx, Medications, Allergies, Fhx, Shx reviewed.  Physical Exam: Blood pressure 130/88, pulse 88, height 5\' 4"  (1.626 m), weight 207 lb (93.895 kg), SpO2 91 %. Body mass index is 35.51 kg/(m^2).   General - No distress, wearing oxygen ENT - No sinus tenderness, clear nasal drainage, no oral exudate, no LAN Cardiac - s1s2 regular, no murmur Chest - decreased breath sounds Rt base, no wheeze/rales Back - No focal tenderness Abd - Soft, non-tender Ext - No edema Neuro - Normal strength Skin - mild redness over malar region Psych - Normal mood, and behavior   Assessment/Plan:  Obstructive sleep apnea, Obesity hypoventilation, tracheobronchomalacia,  and Rt hemidiaphragm paralysis. Plan: - continue BiPAP 14/10 cm H2O with 2 liters oxygen - will arrange for ONO with BiPAP on room air to determine if she still needs to use supplemental oxygen - she will check with her airline about what forms are needed for her to take POC on plane    Chesley Mires, MD [facility] Pager:  [phone] 02/25/2015, 1:44 PM

## 2015-02-25 NOTE — Patient Instructions (Signed)
Will get overnight oxygen test while using BiPAP Follow up in 1 year

## 2015-03-11 ENCOUNTER — Encounter: Payer: Self-pay | Admitting: Internal Medicine

## 2015-03-11 ENCOUNTER — Non-Acute Institutional Stay: Payer: Medicare Other | Admitting: Internal Medicine

## 2015-03-11 VITALS — BP 128/68 | HR 64 | Temp 97.4°F | Ht 63.5 in | Wt 218.0 lb

## 2015-03-11 DIAGNOSIS — D46Z Other myelodysplastic syndromes: Secondary | ICD-10-CM

## 2015-03-11 DIAGNOSIS — E785 Hyperlipidemia, unspecified: Secondary | ICD-10-CM | POA: Diagnosis not present

## 2015-03-11 DIAGNOSIS — K5902 Outlet dysfunction constipation: Secondary | ICD-10-CM | POA: Diagnosis not present

## 2015-03-11 DIAGNOSIS — I480 Paroxysmal atrial fibrillation: Secondary | ICD-10-CM

## 2015-03-11 DIAGNOSIS — R739 Hyperglycemia, unspecified: Secondary | ICD-10-CM | POA: Diagnosis not present

## 2015-03-11 DIAGNOSIS — D4620 Refractory anemia with excess of blasts, unspecified: Secondary | ICD-10-CM

## 2015-03-11 DIAGNOSIS — G4733 Obstructive sleep apnea (adult) (pediatric): Secondary | ICD-10-CM

## 2015-03-11 DIAGNOSIS — G250 Essential tremor: Secondary | ICD-10-CM | POA: Diagnosis not present

## 2015-03-11 DIAGNOSIS — E039 Hypothyroidism, unspecified: Secondary | ICD-10-CM | POA: Diagnosis not present

## 2015-03-11 DIAGNOSIS — I5032 Chronic diastolic (congestive) heart failure: Secondary | ICD-10-CM

## 2015-03-11 NOTE — Progress Notes (Signed)
Patient ID: Cindy Cervantes, female   DOB: 1932-01-13, 81 y.o.   MRN: [id number]   Location:  [facility]  Code Status: full code  Goals of Care:Advanced Directive information Does patient have an advance directive?: Yes, Type of Advance Directive: [place], Does patient want to make changes to advanced directive?: No - Patient declined  Chief Complaint  Patient presents with  . Annual Exam    Comprehensive exam: blood pressure, thyroid, cholesterol, A-Fib, depression, CHF  . Cloudy Vision    to have laser surgery left eye for cloudy cap 04/21/15 by Dr. Talbert Forest  . oxygen    cutting 02 out during the day    HPI: Patient is a 81 y.o. white female seen in the [facility] today for her annual exam.   She is not depressed. She has not fallen.  She lives in assisted living.  Her MMSE was 10/21/14 and she scored 30/30 and passed her clock drawing (done in [facility]).  She had a bone density 06/24/13.  Has had her pneumovax and prevnar and gets flu shots.  Has not had tdap in 10 years.    She has had blurry vision and is for laser surgery by Dr. Talbert Forest on 04/21/15 of left eye.  Is to have lens cleaned.    She uses BiPAP at night and Dr. Halford Chessman is going to arrange for O2 at night through bipap.  She is not using her O2 in the daytime.  Still sob with activity.  Is to have a device on her wrist to check if she needs the O2 at hs.  [facility] takes care of her concentrator.  [facility] controls her Bipap.    She had her pacer checked 02/03/15.  Has been walking more with her rollator walker over to independent living.  Going to visit family in [place], [place] and working up her endurance.  Has family reunion.  Taking her O2.    Lipids were not at goal in 12/15.  Has been on zetia for a long time.  Doesn't eat many eggs.  Does eat cheese.  Feels like there is genetics to it.  Used to take niacin, but it was stopped.  More walking lately.    Takes claritin every day.  Has nasacort and  flonase.     Review of Systems:  Review of Systems  Constitutional: Negative for fever, chills and malaise/fatigue.  HENT: Negative for congestion and hearing loss.   Eyes: Positive for blurred vision.  Respiratory: Positive for shortness of breath.   Cardiovascular: Negative for chest pain and leg swelling.  Gastrointestinal: Positive for constipation. Negative for abdominal pain, blood in stool and melena.  Genitourinary: Negative for dysuria, urgency and frequency.  Musculoskeletal: Negative for myalgias, joint pain and falls.  Skin: Negative for rash.  Neurological: Negative for dizziness and headaches.  Psychiatric/Behavioral: Negative for depression and memory loss.       Admits to some difficulty finding words sometimes    Past Medical History  Diagnosis Date  . Paroxysmal a-fib with RVR    Amiodarone  . History of hyperkalemia in setting of spironolactone 09/2010  . Sinus node dysfunction   . Pacemaker     Implanted December 2012  . Obesity   . Allergic rhinitis   . Hypertension   . Senile cataract   . Hyperlipidemia   . Xerostomia   . Dysphagia   . Cough     2nd to reflux  . OSA (  obstructive sleep apnea)   . Diverticulosis of colon   . Obesity hypoventilation syndrome   . Colon polyp   . Tracheobronchomalacia   . Diaphragm dysfunction     Right hemidiaphragm  . Anemia of renal disease 10/21/2011  . Anemia, iron deficiency 10/21/2011  . MDS (myelodysplastic syndrome), low grade 10/21/2011  . Unspecified vitamin D deficiency   . Dehydration   . Hypopotassemia   . Other specified disease of white blood cells   . Chronic diastolic heart failure   . Reflux esophagitis   . Cholelithiasis   . Osteoarthrosis, unspecified whether generalized or localized, unspecified site   . Disorder of bone and cartilage, unspecified   . Urinary incontinence   . Debility, unspecified   . Long term (current) use of anticoagulants   . Hypothyroidism 02/08/2013  . GERD  (gastroesophageal reflux disease) 02/08/2013  . Depression   . Constipation   . Rosacea 05/15/2013    Past Surgical History  Procedure Laterality Date  . Tubal ligation  1964  . Total knee arthroplasty  1999    right knee  . Tummy tuck  2000    pt "almost died"; respiratory distress, became delrious  . Total knee arthroplasty  2003    left knee  . Cataract extraction  2008  . Pacemaker placement  10/21/2010    dural chamber Dr. [name]    Social History:   reports that she quit smoking about 32 years ago. Her smoking use included Cigarettes. She smoked 0.50 packs per day. She has never used smokeless tobacco. She reports that she drinks about 0.6 oz of alcohol per week. She reports that she does not use illicit drugs.  Allergies  Allergen Reactions  . Codeine     Extreme lethargy  . Iron     By infusion  . Morphine   . Toviaz [Fesoterodine Fumarate]     Medications: Patient's Medications  New Prescriptions   No medications on file  Previous Medications   ACETAMINOPHEN (TYLENOL) 325 MG TABLET    Take 650 mg by mouth every 4 (four) hours as needed.    ALOE-SODIUM CHLORIDE (AYR SALINE NASAL GEL NA)    Place into the nose. Apply topically to each nostril four times daily as needed for soreness and dryness   AMIODARONE (PACERONE) 200 MG TABLET    Take 100 mg by mouth daily.    EZETIMIBE (ZETIA) 10 MG TABLET    Take 10 mg by mouth daily.   FLUTICASONE (FLONASE) 50 MCG/ACT NASAL SPRAY    Place 2 sprays into the nose 2 (two) times daily. 2 sprays in each nostril   HYDROCORTISONE CREAM 1 %    Apply topically as needed.   LEVOTHYROXINE SODIUM 75 MCG CAPS    Take 1 mcg by mouth daily before breakfast.   LORATADINE (CLARITIN) 10 MG TABLET    Take 10 mg by mouth daily.   METRONIDAZOLE (METROGEL) 1 % GEL    Apply topically. Apply to rash on nose once daily   NON FORMULARY    1.5-2 L/min. Oxygen   POLYETHYLENE GLYCOL POWDER (MIRALAX) POWDER    Take 17 g by mouth daily. As directed    POTASSIUM CHLORIDE SA (K-DUR,KLOR-CON) 20 MEQ TABLET    Take 20 mEq by mouth daily.    RIVAROXABAN (XARELTO) 15 MG TABS TABLET    Take 1 tablet (15 mg total) by mouth daily with supper.   SODIUM CHLORIDE (OCEAN) 0.65 % SOLN NASAL SPRAY  1 spray. One spray into nostril every 2 hours as needed for nasal congestion   TORSEMIDE (DEMADEX) 20 MG TABLET    Take 60 mg by mouth daily.   Modified Medications   No medications on file  Discontinued Medications   TRIAMCINOLONE (NASACORT AQ) 55 MCG/ACT NASAL INHALER    Place 2 sprays into the nose daily.     Physical Exam: Filed Vitals:   03/11/15 1025  BP: 128/68  Pulse: 64  Temp: 97.4 F (36.3 C)  TempSrc: Oral  Height: 5' 3.5" (1.613 m)  Weight: 218 lb (98.884 kg)  SpO2: 94%   Body mass index is 38.01 kg/(m^2). Physical Exam  Constitutional: She is oriented to person, place, and time. She appears well-developed and well-nourished. No distress.  HENT:  Head: Normocephalic and atraumatic.  Right Ear: External ear normal.  Left Ear: External ear normal.  Nose: Nose normal.  Mouth/Throat: Oropharynx is clear and moist. No oropharyngeal exudate.  Eyes: Conjunctivae and EOM are normal. Pupils are equal, round, and reactive to light.  Neck: Neck supple. No JVD present. No tracheal deviation present. No thyromegaly present.  Cardiovascular: Normal rate, regular rhythm, normal heart sounds and intact distal pulses.   Pulmonary/Chest: Breath sounds normal. No respiratory distress. She has no wheezes. She has no rales. Right breast exhibits no inverted nipple, no mass, no nipple discharge, no skin change and no tenderness. Left breast exhibits no inverted nipple, no mass, no nipple discharge, no skin change and no tenderness.  Pendulous breasts; Left nipple with pustule on tip, nontender, has been present for several months (plans to see derm)  Abdominal: Soft. Bowel sounds are normal.  Hard areas on bilateral lower quadrants (tells me she has two  areas in her bowel that are twisted so she sometimes gets constipated and will feel hard stool in her abdomen--not currently uncomfortable)  Musculoskeletal: Normal range of motion. She exhibits no edema or tenderness.  Walks with rollator walker  Neurological: She is alert and oriented to person, place, and time.  Has bilateral TKAs and reflexes not obtainable; mild essential tremor of feet when tenses them  Skin: Skin is warm and dry.  Notes two warts on her temples; left nipple has "whitehead" on it     Labs reviewed: Basic Metabolic Panel:  Recent Labs  05/27/14 09/09/14 01/08/15  NA 141 145 144  K 3.7 3.0* 4.1  BUN 21 25* 26*  CREATININE 1.6* 1.6* 1.7*  TSH  --  6.57*  --    Liver Function Tests:  Recent Labs  05/27/14 09/09/14  AST 13 15  ALT 10 13  ALKPHOS 72 76  CBC:  Recent Labs  05/27/14 09/09/14 01/08/15  WBC 6.9 6.7 6.5  HGB 10.4* 11.6* 11.1*  HCT 31* 35* 34*  PLT 173 200 188   Lipid Panel:  Recent Labs  09/09/14  CHOL 224*  HDL 61  LDLCALC 144  TRIG 97   Lab Results  Component Value Date   HGBA1C * 09/07/2010    6.7 (NOTE)                                                                       According to the ADA Clinical Practice Recommendations for 2011, when  HbA1c is used as a screening test:   >=6.5%   Diagnostic of Diabetes Mellitus           (if abnormal result  is confirmed)  5.7-6.4%   Increased risk of developing Diabetes Mellitus  References:Diagnosis and Classification of Diabetes Mellitus,Diabetes VXYI,0165,53(ZSMOL 1):S62-S69 and Standards of Medical Care in         Diabetes - 2011,Diabetes MBEM,7544,92  (Suppl 1):S11-S61.   Patient Care Team: Gayland Curry, DO as PCP - General (Geriatric Medicine) Mardene Celeste, NP as Nurse Practitioner (Geriatric Medicine) [facility] Chesley Mires, MD as Consulting Physician (Pulmonary Disease) Larey Dresser, MD as Consulting Physician (Cardiology) Lafayette Dragon, MD as  Consulting Physician (Gastroenterology) Volanda Napoleon, MD as Consulting Physician (Oncology) Kathryne Hitch, MD as Consulting Physician (Orthopedic Surgery) Sydnee Levans, MD as Consulting Physician (Dermatology) Prentiss Bells, MD as Consulting Physician (Ophthalmology)  Assessment/Plan 1. OSA treated with BiPAP -is for f/u testing to see if she requires the O2 at night -is no longer using in the daytime, but will take portable O2 on her upcoming trip  2. Paroxysmal atrial fibrillation -cont amiodarone, pacer was recently interrogated last month -cont xarelto  3. MDS (myelodysplastic syndrome), low grade -f/u cbc with diff before next appt to monitor  4. Chronic diastolic heart failure -stable at present, no on ace or beta blocker, followed by cardiology, on amiodarone and has pacemaker  5. Hypothyroidism, unspecified hypothyroidism type -cont current synthroid, last tsh was normal, recheck in 6 mos  6. Hyperlipidemia -cont zetia, f/u flp before next visit  -cont reformed diet and exercise  7. Hyperglycemia -f/u hba1c before her next visit -is working on diet and walking more  8. Tremor, essential -chronic and stable of her legs/feet  9. Constipation due to outlet dysfunction -notes she does not feel like she's had much trouble lately, but does have some areas of her bowels where stool does not move through easily (I was able to feel hard stool on either side of her abdomen)  Labs/tests ordered:  Cbc, bmp, flp, hba1c, tsh before next visit Next appt:  6 mos for med mgt  Heath Badon L. Lacye Mccarn, D.O. [facility] [address], [place] [address] Cell Phone (Mon-Fri 8am-5pm):  [phone] On Call:  [phone] & follow prompts after 5pm & weekends Office Phone:  [phone] Office Fax:  [phone]

## 2015-03-13 ENCOUNTER — Telehealth: Payer: Self-pay | Admitting: Pulmonary Disease

## 2015-03-13 NOTE — Telephone Encounter (Signed)
Spoke with Lauren at [facility], she says that she is not sure if they can do ONO on patient in [place], she is checking with her manager to make sure and will call us back to let us know what her manager decides.  Awaiting call back from [place].

## 2015-03-16 NOTE — Telephone Encounter (Signed)
Spoke with Cindy Cervantes and she reports they are going deliver pulse OX today to pt. Nothing further needed

## 2015-03-17 ENCOUNTER — Telehealth: Payer: Self-pay | Admitting: Pulmonary Disease

## 2015-03-17 NOTE — Telephone Encounter (Signed)
Patient is needing [employer] form filled out ASAP.  Pt is going out of town from 03/25/16 - 03/30/15 Form will be given to Dr Halford Chessman 03/18/15 as he is in office all day.

## 2015-03-18 ENCOUNTER — Telehealth: Payer: Self-pay | Admitting: Pulmonary Disease

## 2015-03-18 NOTE — Telephone Encounter (Signed)
Forms have been completed- faxed back to [phone] Placed to scan. Nothing further needed.

## 2015-03-18 NOTE — Telephone Encounter (Signed)
Form completed.

## 2015-03-18 NOTE — Telephone Encounter (Signed)
Spoke with Cerise at [facility] - have not received form yet, will call if she doesn't receive it today.

## 2015-03-18 NOTE — Telephone Encounter (Signed)
Cindy Cervantes with [facility] returned a call today.  Form was faxed for airline travel with oxygen. She is checking on status.

## 2015-03-18 NOTE — Telephone Encounter (Signed)
Cerise from [facility] is asking if [facility] has received the form for [employer]. She is asking if you can fax it back to her at [phone] because the pt's family is wanting proof that it has been faxed. Please advise if [facility] has it. Thanks

## 2015-03-18 NOTE — Telephone Encounter (Signed)
   Virl Cagey, CMA at 03/18/2015 9:01 AM     Status: Signed       Expand All Collapse All   Forms have been completed- faxed back to [phone] Placed to scan. Nothing further needed.       Cerise states that the fax did not come through. Alternate fax # given [phone]). This has been refaxed to number given.

## 2015-03-18 NOTE — Telephone Encounter (Signed)
[email or web address] on VM

## 2015-03-19 ENCOUNTER — Telehealth: Payer: Self-pay | Admitting: Pulmonary Disease

## 2015-03-19 DIAGNOSIS — J398 Other specified diseases of upper respiratory tract: Secondary | ICD-10-CM | POA: Diagnosis not present

## 2015-03-19 DIAGNOSIS — J984 Other disorders of lung: Secondary | ICD-10-CM | POA: Diagnosis not present

## 2015-03-19 DIAGNOSIS — G4733 Obstructive sleep apnea (adult) (pediatric): Secondary | ICD-10-CM | POA: Diagnosis not present

## 2015-03-19 NOTE — Telephone Encounter (Signed)
Results have been explained to patient, pt expressed understanding. Nothing further needed.  

## 2015-03-19 NOTE — Telephone Encounter (Signed)
ONO on BiPAP 03/16/15 >> Test time 8 hrs 2 min.  Mean SpO2 90.3%, low SpO2 70%.  Spent 33 min with SpO2 < 88%.  Will have my nurse inform pt that her oxygen still is low at night, even when she uses BiPAP.  She needs to continue using oxygen with BiPAP at night.

## 2015-03-19 NOTE — Telephone Encounter (Signed)
Spoke with Cerise, forms have been received. Nothing further needed.

## 2015-04-09 ENCOUNTER — Encounter: Payer: Self-pay | Admitting: Pulmonary Disease

## 2015-04-14 ENCOUNTER — Telehealth: Payer: Self-pay | Admitting: Pulmonary Disease

## 2015-04-14 DIAGNOSIS — G4733 Obstructive sleep apnea (adult) (pediatric): Secondary | ICD-10-CM

## 2015-04-14 DIAGNOSIS — Z9989 Dependence on other enabling machines and devices: Principal | ICD-10-CM

## 2015-04-14 NOTE — Telephone Encounter (Signed)
Tried to call pt. No VM available. WCB

## 2015-04-15 NOTE — Telephone Encounter (Signed)
Spoke with pt and she states her insurance no longer covers [facility] and needs to change to [facility].  Pt is in current need of bipap supplies.  Order placed.

## 2015-04-16 ENCOUNTER — Telehealth: Payer: Self-pay | Admitting: Internal Medicine

## 2015-04-16 ENCOUNTER — Encounter: Payer: Self-pay | Admitting: Adult Health

## 2015-04-16 ENCOUNTER — Non-Acute Institutional Stay: Payer: Medicare Other | Admitting: Adult Health

## 2015-04-16 DIAGNOSIS — D649 Anemia, unspecified: Secondary | ICD-10-CM | POA: Diagnosis not present

## 2015-04-16 DIAGNOSIS — I480 Paroxysmal atrial fibrillation: Secondary | ICD-10-CM

## 2015-04-16 DIAGNOSIS — R42 Dizziness and giddiness: Secondary | ICD-10-CM | POA: Diagnosis not present

## 2015-04-16 DIAGNOSIS — E039 Hypothyroidism, unspecified: Secondary | ICD-10-CM | POA: Diagnosis not present

## 2015-04-16 NOTE — Telephone Encounter (Signed)
Informed Tilicia at [facility] that patient should be on both medications.   Also informed her that Sherrie Mustache w/ [facility] changed her to Xarelto, not our office.

## 2015-04-16 NOTE — Progress Notes (Signed)
Patient ID: Cindy Cervantes, female   DOB: 05/05/1932, 81 y.o.   MRN: [id number]   Location:  [facility]  Code Status: full code  Goals of Care:Advanced Directive information    No chief complaint on file.   HPI: Patient is a 81 y.o. white female seen in the [facility] today for complaints of dizziness that started on 04/15/15. She has a hx of CHF, OSA on bipap, HTN, afib, tracheobronchomalacia, CKD, anemia, pacemaker with [employer], HLD, MDS, and hypothyroidism.  She reports that she has had a decreased appetite and and feeling "light headed and dizzy". She reports that it happens sometimes when she is sitting but can worsen upon standing. She is drinking well by her accounts. She has felt this way before and reports that is was due to anemia and low K. She denies SOB, blurry vision, unilateral weakness, CP, diarrhea, dysuria, constipation, or fever.  She did report that she has forgotten to bleed oxygen into her bipap machine two nights ago.  Review of Systems:  Review of Systems  Constitutional: Negative for fever, chills, malaise/fatigue and diaphoresis.  HENT: Negative for congestion and hearing loss.   Eyes: Positive for blurred vision (long standing, going to have surgery). Negative for double vision, photophobia and pain.  Respiratory: Negative for cough, sputum production, shortness of breath and wheezing.   Cardiovascular: Negative for chest pain, palpitations, orthopnea, claudication, leg swelling and PND.  Gastrointestinal: Positive for constipation (hx of not current). Negative for abdominal pain, diarrhea, blood in stool and melena.       Decreased appetite  Genitourinary: Negative for dysuria, urgency and frequency.  Musculoskeletal: Negative for myalgias, joint pain and falls.  Skin: Negative for rash.  Neurological: Positive for dizziness. Negative for tingling, speech change, focal weakness, seizures, loss of consciousness, weakness and headaches.    Psychiatric/Behavioral: Negative for depression and memory loss.       Admits to some difficulty finding words sometimes    Past Medical History  Diagnosis Date  . Paroxysmal a-fib with RVR    Amiodarone  . History of hyperkalemia in setting of spironolactone 09/2010  . Sinus node dysfunction   . Pacemaker     Implanted December 2012  . Obesity   . Allergic rhinitis   . Hypertension   . Senile cataract   . Hyperlipidemia   . Xerostomia   . Dysphagia   . Cough     2nd to reflux  . OSA (obstructive sleep apnea)   . Diverticulosis of colon   . Obesity hypoventilation syndrome   . Colon polyp   . Tracheobronchomalacia   . Diaphragm dysfunction     Right hemidiaphragm  . Anemia of renal disease 10/21/2011  . Anemia, iron deficiency 10/21/2011  . MDS (myelodysplastic syndrome), low grade 10/21/2011  . Unspecified vitamin D deficiency   . Dehydration   . Hypopotassemia   . Other specified disease of white blood cells   . Chronic diastolic heart failure   . Reflux esophagitis   . Cholelithiasis   . Osteoarthrosis, unspecified whether generalized or localized, unspecified site   . Disorder of bone and cartilage, unspecified   . Urinary incontinence   . Debility, unspecified   . Long term (current) use of anticoagulants   . Hypothyroidism 02/08/2013  . GERD (gastroesophageal reflux disease) 02/08/2013  . Depression   . Constipation   . Rosacea 05/15/2013    Past Surgical History  Procedure Laterality Date  . Tubal ligation  1964  . Total knee arthroplasty  1999    right knee  . Tummy tuck  2000    pt "almost died"; respiratory distress, became delrious  . Total knee arthroplasty  2003    left knee  . Cataract extraction  2008  . Pacemaker placement  10/21/2010    dural chamber Dr. [name]    Social History:   reports that she quit smoking about 32 years ago. Her smoking use included Cigarettes. She smoked 0.50 packs per day. She has never used smokeless tobacco. She  reports that she drinks about 0.6 oz of alcohol per week. She reports that she does not use illicit drugs.  Allergies  Allergen Reactions  . Codeine     Extreme lethargy  . Iron     By infusion  . Morphine   . Toviaz [Fesoterodine Fumarate]     Medications: Patient's Medications  New Prescriptions   No medications on file  Previous Medications   ACETAMINOPHEN (TYLENOL) 325 MG TABLET    Take 650 mg by mouth every 4 (four) hours as needed.    ALOE-SODIUM CHLORIDE (AYR SALINE NASAL GEL NA)    Place into the nose. Apply topically to each nostril four times daily as needed for soreness and dryness   AMIODARONE (PACERONE) 200 MG TABLET    Take 100 mg by mouth daily.    EZETIMIBE (ZETIA) 10 MG TABLET    Take 10 mg by mouth daily.   FLUTICASONE (FLONASE) 50 MCG/ACT NASAL SPRAY    Place 2 sprays into the nose 2 (two) times daily. 2 sprays in each nostril   HYDROCORTISONE CREAM 1 %    Apply topically as needed.   LEVOTHYROXINE SODIUM 75 MCG CAPS    Take 1 mcg by mouth daily before breakfast.   LORATADINE (CLARITIN) 10 MG TABLET    Take 10 mg by mouth daily.   METRONIDAZOLE (METROGEL) 1 % GEL    Apply topically. Apply to rash on nose once daily   NON FORMULARY    1.5-2 L/min. Oxygen   POLYETHYLENE GLYCOL POWDER (MIRALAX) POWDER    Take 17 g by mouth daily. As directed   POTASSIUM CHLORIDE SA (K-DUR,KLOR-CON) 20 MEQ TABLET    Take 20 mEq by mouth daily.    RIVAROXABAN (XARELTO) 15 MG TABS TABLET    Take 1 tablet (15 mg total) by mouth daily with supper.   SODIUM CHLORIDE (OCEAN) 0.65 % SOLN NASAL SPRAY    1 spray. One spray into nostril every 2 hours as needed for nasal congestion   TORSEMIDE (DEMADEX) 20 MG TABLET    Take 60 mg by mouth daily.   Modified Medications   No medications on file  Discontinued Medications   No medications on file     Physical Exam:  Lying down 121/70 67 Sitting up 122/80 81 Standing 127/79 76 97.6 16 96% on RA  There were no vitals filed for this  visit. There is no weight on file to calculate BMI. Physical Exam  Constitutional: She is oriented to person, place, and time. She appears well-developed and well-nourished. No distress.  HENT:  Head: Normocephalic and atraumatic.  Eyes: Conjunctivae and EOM are normal. Pupils are equal, round, and reactive to light.  Neck: Neck supple. No JVD present. No tracheal deviation present. No thyromegaly present.  Cardiovascular: Regular rhythm and normal heart sounds.   Irregular. No edema  Pulmonary/Chest: Effort normal and breath sounds normal. No respiratory distress. She has no wheezes. She  has no rales. Right breast exhibits no inverted nipple, no mass, no nipple discharge, no skin change and no tenderness. Left breast exhibits no inverted nipple, no mass, no nipple discharge, no skin change and no tenderness.  Abdominal: Soft. Bowel sounds are normal. She exhibits no distension. There is no tenderness.  Musculoskeletal: Normal range of motion. She exhibits no edema or tenderness.  Walks with rollator walker  Neurological: She is alert and oriented to person, place, and time. No cranial nerve deficit. Coordination normal.  Skin: Skin is warm and dry.  Psychiatric: She has a normal mood and affect.     Labs reviewed: Basic Metabolic Panel:  Recent Labs  05/27/14 09/09/14 01/08/15  NA 141 145 144  K 3.7 3.0* 4.1  BUN 21 25* 26*  CREATININE 1.6* 1.6* 1.7*  TSH  --  6.57*  --    Liver Function Tests:  Recent Labs  05/27/14 09/09/14  AST 13 15  ALT 10 13  ALKPHOS 72 76  CBC:  Recent Labs  05/27/14 09/09/14 01/08/15  WBC 6.9 6.7 6.5  HGB 10.4* 11.6* 11.1*  HCT 31* 35* 34*  PLT 173 200 188   Lipid Panel:  Recent Labs  09/09/14  CHOL 224*  HDL 61  LDLCALC 144  TRIG 97   Lab Results  Component Value Date   HGBA1C * 09/07/2010    6.7 (NOTE)                                                                       According to the ADA Clinical Practice Recommendations for  2011, when HbA1c is used as a screening test:   >=6.5%   Diagnostic of Diabetes Mellitus           (if abnormal result  is confirmed)  5.7-6.4%   Increased risk of developing Diabetes Mellitus  References:Diagnosis and Classification of Diabetes Mellitus,Diabetes ZOXW,9604,54(UJWJX 1):S62-S69 and Standards of Medical Care in         Diabetes - 2011,Diabetes Care,2011,34  (Suppl 1):S11-S61.   Patient Care Team: Gayland Curry, DO as PCP - General (Geriatric Medicine) Mardene Celeste, NP as Nurse Practitioner (Geriatric Medicine) [facility] Chesley Mires, MD as Consulting Physician (Pulmonary Disease) Larey Dresser, MD as Consulting Physician (Cardiology) Lafayette Dragon, MD as Consulting Physician (Gastroenterology) Volanda Napoleon, MD as Consulting Physician (Oncology) Kathryne Hitch, MD as Consulting Physician (Orthopedic Surgery) Sydnee Levans, MD as Consulting Physician (Dermatology) Prentiss Bells, MD as Consulting Physician (Ophthalmology)  Assessment/Plan  1) Dizziness -does not appear orthostatic by her VS -Check CBC to rule out anemia or infection -Check CMP to rule out ARF, low K, dehydration -UA clean catch -encourage fluid -will hold torsemide if BP drops, stable at this time  2) Afib  -Rate controlled with amiodarone -Pharmacy questions the use of amiodarone with xarelto -She has been on it for several months without any issues -She is on a low dose of amiodarone that cardiology would like to continue -If her BMP returns with worsening renal failure would d/c and try another agent   Cindi Carbon, [place] [phone]

## 2015-04-16 NOTE — Telephone Encounter (Signed)
New message      Pt is on amiodarone and xarelto.  Pharmacist at [facility] want to know if she needs to be on both medications?  Please call

## 2015-04-17 DIAGNOSIS — D692 Other nonthrombocytopenic purpura: Secondary | ICD-10-CM | POA: Diagnosis not present

## 2015-04-17 DIAGNOSIS — L821 Other seborrheic keratosis: Secondary | ICD-10-CM | POA: Diagnosis not present

## 2015-04-17 DIAGNOSIS — L718 Other rosacea: Secondary | ICD-10-CM | POA: Diagnosis not present

## 2015-04-17 DIAGNOSIS — D225 Melanocytic nevi of trunk: Secondary | ICD-10-CM | POA: Diagnosis not present

## 2015-04-17 DIAGNOSIS — L814 Other melanin hyperpigmentation: Secondary | ICD-10-CM | POA: Diagnosis not present

## 2015-04-21 DIAGNOSIS — H18412 Arcus senilis, left eye: Secondary | ICD-10-CM | POA: Diagnosis not present

## 2015-04-21 DIAGNOSIS — H02839 Dermatochalasis of unspecified eye, unspecified eyelid: Secondary | ICD-10-CM | POA: Diagnosis not present

## 2015-04-21 DIAGNOSIS — H26492 Other secondary cataract, left eye: Secondary | ICD-10-CM | POA: Diagnosis not present

## 2015-04-21 DIAGNOSIS — Z961 Presence of intraocular lens: Secondary | ICD-10-CM | POA: Diagnosis not present

## 2015-04-21 DIAGNOSIS — H18411 Arcus senilis, right eye: Secondary | ICD-10-CM | POA: Diagnosis not present

## 2015-04-28 DIAGNOSIS — H52222 Regular astigmatism, left eye: Secondary | ICD-10-CM | POA: Diagnosis not present

## 2015-04-28 DIAGNOSIS — Z9849 Cataract extraction status, unspecified eye: Secondary | ICD-10-CM | POA: Diagnosis not present

## 2015-04-28 DIAGNOSIS — H524 Presbyopia: Secondary | ICD-10-CM | POA: Diagnosis not present

## 2015-04-28 DIAGNOSIS — H5203 Hypermetropia, bilateral: Secondary | ICD-10-CM | POA: Diagnosis not present

## 2015-04-28 DIAGNOSIS — H26499 Other secondary cataract, unspecified eye: Secondary | ICD-10-CM | POA: Diagnosis not present

## 2015-04-30 DIAGNOSIS — D485 Neoplasm of uncertain behavior of skin: Secondary | ICD-10-CM | POA: Diagnosis not present

## 2015-04-30 DIAGNOSIS — L859 Epidermal thickening, unspecified: Secondary | ICD-10-CM | POA: Diagnosis not present

## 2015-05-05 ENCOUNTER — Ambulatory Visit (INDEPENDENT_AMBULATORY_CARE_PROVIDER_SITE_OTHER): Payer: Medicare Other | Admitting: *Deleted

## 2015-05-05 DIAGNOSIS — I495 Sick sinus syndrome: Secondary | ICD-10-CM

## 2015-05-06 NOTE — Progress Notes (Signed)
Remote pacemaker transmission.   

## 2015-05-11 LAB — CUP PACEART REMOTE DEVICE CHECK
Brady Statistic AP VS Percent: 45 %
Brady Statistic AS VS Percent: 54 %
Date Time Interrogation Session: 20160802063326
Lead Channel Impedance Value: 730 Ohm
Lead Channel Pacing Threshold Amplitude: 0.375 V
Lead Channel Pacing Threshold Amplitude: 0.75 V
Lead Channel Pacing Threshold Pulse Width: 0.5 ms
Lead Channel Sensing Intrinsic Amplitude: 10.5 mV
MDC IDC MSMT BATTERY REMAINING LONGEVITY: 103 mo
MDC IDC MSMT BATTERY REMAINING PERCENTAGE: 81 %
MDC IDC MSMT BATTERY VOLTAGE: 2.93 V
MDC IDC MSMT LEADCHNL RA IMPEDANCE VALUE: 410 Ohm
MDC IDC MSMT LEADCHNL RA SENSING INTR AMPL: 2.4 mV
MDC IDC MSMT LEADCHNL RV PACING THRESHOLD PULSEWIDTH: 0.5 ms
MDC IDC SET LEADCHNL RA PACING AMPLITUDE: 1.375
MDC IDC SET LEADCHNL RV PACING AMPLITUDE: 2.5 V
MDC IDC SET LEADCHNL RV PACING PULSEWIDTH: 0.5 ms
MDC IDC SET LEADCHNL RV SENSING SENSITIVITY: 2 mV
MDC IDC STAT BRADY AP VP PERCENT: 1 %
MDC IDC STAT BRADY AS VP PERCENT: 1 %
MDC IDC STAT BRADY RA PERCENT PACED: 45 %
MDC IDC STAT BRADY RV PERCENT PACED: 1 %
Pulse Gen Serial Number: 7198677

## 2015-06-01 ENCOUNTER — Other Ambulatory Visit: Payer: Self-pay

## 2015-06-01 DIAGNOSIS — Z1231 Encounter for screening mammogram for malignant neoplasm of breast: Secondary | ICD-10-CM

## 2015-06-04 ENCOUNTER — Encounter: Payer: Self-pay | Admitting: Cardiology

## 2015-06-17 ENCOUNTER — Encounter: Payer: Self-pay | Admitting: Internal Medicine

## 2015-07-01 ENCOUNTER — Ambulatory Visit
Admission: RE | Admit: 2015-07-01 | Discharge: 2015-07-01 | Disposition: A | Discharge status: Home / Self Care | Payer: Medicare Other | Source: Ambulatory Visit

## 2015-07-01 DIAGNOSIS — Z1231 Encounter for screening mammogram for malignant neoplasm of breast: Secondary | ICD-10-CM

## 2015-07-08 DIAGNOSIS — Z23 Encounter for immunization: Secondary | ICD-10-CM | POA: Diagnosis not present

## 2015-08-06 ENCOUNTER — Ambulatory Visit (INDEPENDENT_AMBULATORY_CARE_PROVIDER_SITE_OTHER): Payer: Medicare Other | Admitting: *Deleted

## 2015-08-06 DIAGNOSIS — I495 Sick sinus syndrome: Secondary | ICD-10-CM

## 2015-08-07 NOTE — Progress Notes (Signed)
Remote pacemaker transmission.   

## 2015-08-26 ENCOUNTER — Encounter: Payer: Self-pay | Admitting: Cardiology

## 2015-08-26 LAB — CUP PACEART REMOTE DEVICE CHECK
Brady Statistic AP VP Percent: 1 %
Brady Statistic AP VS Percent: 47 %
Brady Statistic AS VP Percent: 1 %
Implantable Lead Implant Date: 20120119
Implantable Lead Location: 753859
Lead Channel Pacing Threshold Amplitude: 0.5 V
Lead Channel Pacing Threshold Pulse Width: 0.5 ms
MDC IDC LEAD IMPLANT DT: 20120119
MDC IDC LEAD LOCATION: 753860
MDC IDC LEAD MODEL: 1948
MDC IDC MSMT BATTERY REMAINING LONGEVITY: 103 mo
MDC IDC MSMT BATTERY REMAINING PERCENTAGE: 81 %
MDC IDC MSMT BATTERY VOLTAGE: 2.93 V
MDC IDC PG SERIAL: 7198677
MDC IDC SESS DTM: 20161103070500
MDC IDC SET LEADCHNL RA PACING AMPLITUDE: 1.5 V
MDC IDC SET LEADCHNL RV PACING AMPLITUDE: 2.5 V
MDC IDC SET LEADCHNL RV PACING PULSEWIDTH: 0.5 ms
MDC IDC SET LEADCHNL RV SENSING SENSITIVITY: 2 mV
MDC IDC STAT BRADY AS VS PERCENT: 52 %
MDC IDC STAT BRADY RA PERCENT PACED: 47 %
MDC IDC STAT BRADY RV PERCENT PACED: 1 %
Pulse Gen Model: 2210

## 2015-09-08 DIAGNOSIS — D649 Anemia, unspecified: Secondary | ICD-10-CM | POA: Diagnosis not present

## 2015-09-08 DIAGNOSIS — E039 Hypothyroidism, unspecified: Secondary | ICD-10-CM | POA: Diagnosis not present

## 2015-09-08 DIAGNOSIS — E119 Type 2 diabetes mellitus without complications: Secondary | ICD-10-CM | POA: Diagnosis not present

## 2015-09-09 ENCOUNTER — Encounter: Payer: Self-pay | Admitting: Internal Medicine

## 2015-09-09 ENCOUNTER — Non-Acute Institutional Stay: Payer: Medicare Other | Admitting: Internal Medicine

## 2015-09-09 VITALS — BP 140/72 | HR 64 | Temp 97.5°F | Wt 212.0 lb

## 2015-09-09 DIAGNOSIS — D4620 Refractory anemia with excess of blasts, unspecified: Secondary | ICD-10-CM

## 2015-09-09 DIAGNOSIS — I480 Paroxysmal atrial fibrillation: Secondary | ICD-10-CM

## 2015-09-09 DIAGNOSIS — R739 Hyperglycemia, unspecified: Secondary | ICD-10-CM

## 2015-09-09 DIAGNOSIS — I5032 Chronic diastolic (congestive) heart failure: Secondary | ICD-10-CM

## 2015-09-09 DIAGNOSIS — G4733 Obstructive sleep apnea (adult) (pediatric): Secondary | ICD-10-CM

## 2015-09-09 DIAGNOSIS — D46Z Other myelodysplastic syndromes: Secondary | ICD-10-CM

## 2015-09-09 DIAGNOSIS — M10072 Idiopathic gout, left ankle and foot: Secondary | ICD-10-CM

## 2015-09-09 DIAGNOSIS — E785 Hyperlipidemia, unspecified: Secondary | ICD-10-CM

## 2015-09-09 DIAGNOSIS — D509 Iron deficiency anemia, unspecified: Secondary | ICD-10-CM

## 2015-09-09 DIAGNOSIS — E039 Hypothyroidism, unspecified: Secondary | ICD-10-CM

## 2015-09-09 NOTE — Progress Notes (Signed)
Patient ID: Cindy Cervantes, female   DOB: [birth year], 81 y.o.   MRN: [id number]   Location: [place] Provider: Watson Robarge L. [name], D.O., C.M.D.  Code Status: DNR Goals of Care: Advanced Directive information Does patient have an advance directive?: Yes, Type of Advance Directive: Healthcare Power of Attorney  Chief Complaint  Patient presents with  . Medical Management of Chronic Issues    CHF, blood pressure, A-Fib, thyroid.     HPI: Patient is a 81 y.o. female with history of CHF, OSA on bipap, HTN, afib, tracheobronchomalacia, CKD, anemia, pacemaker with St Jude, HLD, MDS, and hypothyroidism seen in the office today for med mgt of her chronic diseases.    Went to [place] for a family reunion.  18 of her side of the family were there.  No new concerns, but a few weeks ago, she had a sore on the outside of her foot.  The next day, there was a red spot, then it moved to the bottom of her foot.  Woke up in teh middle of the night with it throbbing.  Thought she'd have to come see me.  Then moved to her big toe like gout--was swollen for a day and sore in joint, then resolved.  Has had gout in the past.  Has a pimple on her left side of her nose.  Uses her bipap mask and that seems to be the cause.  Uses metrogel for it.    Has a skin tag behind her ear so keeps a dressing on it.  Says she has had several frozen before.    Has no appetite, tired of same food here.  Has to force herself to eat.    Afib.  No problems.  Continues on amiodarone.  Had her pacemaker interrogation dose in August.  To see Dr. [name] in Feb.    Has chronic morning cough.  Dissipates as day goes on.  Wonders if it has to do with the newspaper.  Begins right after her am synthroid.  Spit up phlegm after she showered.    She historically had severe anemia and had followed with Dr. Marin Olp for it.  Anemia is back at present but mild.  She feels well.  Did not realize she had it.  Also has mild myelodysplastic  syndrome.    Hypothyroidism:  tsh has been stable with current medication.  OSA continues to use her cpap with difficulty with pimples as above--has begun washing her face and applying metrogel at bedtime.  Hyperlipidemia:  Due to food choices--eats very few veggies.  Review of Systems:  Review of Systems  Constitutional: Negative for fever and chills.  HENT: Negative for congestion and hearing loss.   Eyes: Negative for blurred vision.  Respiratory: Positive for cough and sputum production. Negative for shortness of breath.   Cardiovascular: Negative for chest pain.  Gastrointestinal: Negative for abdominal pain, constipation, blood in stool and melena.       Has hemorrhoids and difficult to clean herself  Genitourinary: Negative for dysuria, urgency, frequency and hematuria.       Urine foul smelling she says, does drink a lot of water b/c always thirsty  Musculoskeletal: Negative for back pain, joint pain and falls.       Gout episode  Skin:       Skin tag  Neurological: Positive for tremors. Negative for dizziness and loss of consciousness.  Endo/Heme/Allergies: Bruises/bleeds easily.  Psychiatric/Behavioral: Negative for depression and memory loss.    Past Medical  History  Diagnosis Date  . Paroxysmal a-fib (HCC) with RVR    Amiodarone  . History of hyperkalemia in setting of spironolactone 09/2010  . Sinus node dysfunction (HCC)   . Pacemaker     Implanted December 2012  . Obesity   . Allergic rhinitis   . Hypertension   . Senile cataract   . Hyperlipidemia   . Xerostomia   . Dysphagia   . Cough     2nd to reflux  . OSA (obstructive sleep apnea)   . Diverticulosis of colon   . Obesity hypoventilation syndrome ([place])   . Colon polyp   . Tracheobronchomalacia   . Diaphragm dysfunction     Right hemidiaphragm  . Anemia of renal disease 10/21/2011  . Anemia, iron deficiency 10/21/2011  . MDS (myelodysplastic syndrome), low grade ([place]) 10/21/2011  . Unspecified  vitamin D deficiency   . Dehydration   . Hypopotassemia   . Other specified disease of white blood cells   . Chronic diastolic heart failure ([place])   . Reflux esophagitis   . Cholelithiasis   . Osteoarthrosis, unspecified whether generalized or localized, unspecified site   . Disorder of bone and cartilage, unspecified   . Urinary incontinence   . Debility, unspecified   . Long term (current) use of anticoagulants   . Hypothyroidism 02/08/2013  . GERD (gastroesophageal reflux disease) 02/08/2013  . Depression   . Constipation   . Rosacea 05/15/2013    Past Surgical History  Procedure Laterality Date  . Tubal ligation  1964  . Total knee arthroplasty  1999    right knee  . Tummy tuck  2000    pt "almost died"; respiratory distress, became delrious  . Total knee arthroplasty  2003    left knee  . Cataract extraction  2008  . Pacemaker placement  10/21/2010    dural chamber Dr. [name]    Allergies  Allergen Reactions  . Codeine     Extreme lethargy  . Iron     By infusion  . Morphine   . Toviaz [Fesoterodine Fumarate]       Medication List       This list is accurate as of: 09/09/15 10:19 AM.  Always use your most recent med list.               amiodarone 200 MG tablet  Commonly known as:  PACERONE  Take 100 mg by mouth daily.     AYR SALINE NASAL GEL NA  Place into the nose. Apply topically to each nostril four times daily as needed for soreness and dryness     ezetimibe 10 MG tablet  Commonly known as:  ZETIA  Take 10 mg by mouth daily.     fluticasone 50 MCG/ACT nasal spray  Commonly known as:  FLONASE  Place 2 sprays into the nose 2 (two) times daily. 2 sprays in each nostril     hydrocortisone cream 1 %  Apply topically as needed.     Levothyroxine Sodium 75 MCG Caps  Take 1 mcg by mouth daily before breakfast.     loratadine 10 MG tablet  Commonly known as:  CLARITIN  Take 10 mg by mouth daily.     metroNIDAZOLE 1 % gel  Commonly known as:   METROGEL  Apply topically. Apply to rash on nose once daily     MIRALAX powder  Generic drug:  polyethylene glycol powder  Take 17 g by mouth daily. As directed  MUCINEX DM 30-600 MG Tb12  Take by mouth. Take one tablet twice daily as needed for cough     NON FORMULARY  1.5-2 L/min. Oxygen     potassium chloride SA 20 MEQ tablet  Commonly known as:  K-DUR,KLOR-CON  Take 20 mEq by mouth daily.     Rivaroxaban 15 MG Tabs tablet  Commonly known as:  XARELTO  Take 1 tablet (15 mg total) by mouth daily with supper.     sodium chloride 0.65 % Soln nasal spray  Commonly known as:  OCEAN  1 spray. One spray into nostril every 2 hours as needed for nasal congestion     torsemide 20 MG tablet  Commonly known as:  DEMADEX  Take 60 mg by mouth daily.     TYLENOL 325 MG tablet  Generic drug:  acetaminophen  Take 650 mg by mouth every 4 (four) hours as needed.        Health Maintenance  Topic Date Due  . TETANUS/TDAP  12/30/1952  . ZOSTAVAX  12/30/1993  . INFLUENZA VACCINE  05/03/2016  . DEXA SCAN  Completed  . PNA vac Low Risk Adult  Completed    Physical Exam: Filed Vitals:   09/09/15 0926  BP: 140/72  Pulse: 64  Temp: 97.5 F (36.4 C)  TempSrc: Oral  Weight: 212 lb (96.163 kg)  SpO2: 98%   Body mass index is 36.96 kg/(m^2). Physical Exam  Constitutional: She is oriented to person, place, and time. She appears well-developed and well-nourished. No distress.  Cardiovascular: Normal rate, regular rhythm, normal heart sounds and intact distal pulses.   Pulmonary/Chest: Effort normal and breath sounds normal. No respiratory distress.  Abdominal: Soft. Bowel sounds are normal. She exhibits no distension. There is no tenderness.  Musculoskeletal: Normal range of motion.  Walks with walker  Neurological: She is alert and oriented to person, place, and time.  Skin: Skin is warm and dry.  Small seborrheic keratosis behind left ear near hairline  Psychiatric: She has a  normal mood and affect. Her behavior is normal. Judgment and thought content normal.   Labs reviewed: Basic Metabolic Panel:  Recent Labs  01/08/15  NA 144  K 4.1  BUN 26*  CREATININE 1.7*   Liver Function Tests: No results for input(s): AST, ALT, ALKPHOS, BILITOT, PROT, ALBUMIN in the last 8760 hours. No results for input(s): LIPASE, AMYLASE in the last 8760 hours. No results for input(s): AMMONIA in the last 8760 hours. CBC:  Recent Labs  01/08/15  WBC 6.5  HGB 11.1*  HCT 34*  PLT 188   Lipid Panel: No results for input(s): CHOL, HDL, LDLCALC, TRIG, CHOLHDL, LDLDIRECT in the last 8760 hours. Lab Results  Component Value Date   HGBA1C * 09/07/2010    6.7 (NOTE)                                                                       According to the ADA Clinical Practice Recommendations for 2011, when HbA1c is used as a screening test:   >=6.5%   Diagnostic of Diabetes Mellitus           (if abnormal result  is confirmed)  5.7-6.4%   Increased risk of developing Diabetes Mellitus  References:Diagnosis and Classification of Diabetes Mellitus,Diabetes [id number] 1):S62-S69 and Standards of Medical Care in         Diabetes - 2011,Diabetes [id number]  (Suppl 1):S11-S61.    Procedures since last visit: Had pacemaker checked  Assessment/Plan 1. Paroxysmal atrial fibrillation (HCC) -has pacemaker -continues on amiodarone, xarelto for anticoagulation -has become slightly more anemic so monitor  2. MDS (myelodysplastic syndrome), low grade (HCC) -continue to monitor cbc, has normal wbc and platelets, just mild anemia and no symptoms, no bleeding  3. Anemia, iron deficiency -f/u cbc and iron panel before next appt to further assess  4. OSA treated with BiPAP -cont bipap at hs, keep equipment clean and also wash face and use metrogel to prevent breakouts  -through pulmonary, Dr. Halford Chessman  5. Chronic diastolic heart failure (HCC) -asymptomatic, no recent flares,  continues torsemide--potassium was low with just 31meq so increased to 76meq yesterday when labs returned  6. Hypothyroidism, unspecified hypothyroidism type -TSH has been stable, cont synthroid 63mcg daily before breakfast  7. Hyperlipidemia -cont to work on diet and cont zetia, f/u lab next time  8. Hyperglycemia -cont to work on diet, more greens and fresh veggies and less bread/sandwiches  9. Acute idiopathic gout of left foot -just had flare up in left great toe -f/u uric acid level with other labs before visit  Labs/tests ordered:  Cbc, cmp, flp, hba1c, iron panel, uric acid before Next appt:  6 mos annual  Karel Turpen L. Lanaysia Fritchman, D.O. [facility] [address], [place] [address] Cell Phone (Mon-Fri 8am-5pm):  [phone] On Call:  [phone] & follow prompts after 5pm & weekends Office Phone:  [phone] Office Fax:  [phone]

## 2015-11-10 NOTE — Progress Notes (Signed)
Cardiology Office Note Date:  11/11/2015  Patient ID:  Cindy, Cervantes 02-07-1932, MRN [id number] PCP:  Hollace Kinnier, DO  Electrophysiology: Dr. [name] [facility] Chesley Mires, MD as Consulting Physician (Pulmonary Disease) Volanda Napoleon, MD as Consulting Physician (Oncology) Trixie Rude., MD as Consulting Physician (Ophthalmology)   Chief Complaint:  Routine EP, pacer visit  History of Present Illness: Cindy Cervantes is a 82 y.o. female with history of CHF, OSA on bipap, HTN, afib, tracheobronchomalacia, CKD, anemia, pacemaker with St Jude, HLD, MDS, and hypothyroidism   She was last seen by her geriatric MD Dec 2016 felt her chronic conditions were stable.  She was last seen by EP, Dr. [name] Feb 2016, doing well from AF/PPM standpoint. She has hx of PAFib with a rapid ventricular response and sinus node dysfunction for which she required pacemaker implantation in the fall of 2011. She takes amiodarone.  She comes today, seen for Dr. [name], feeling quite well.  She only uses her O2 at night with her BIPAP, she denies any kind of CP, palpitations, no dizziness, near syncope or syncope.  NO bleeding or signs of bleeding reported.   Past Medical History  Diagnosis Date  . Paroxysmal a-fib (HCC) with RVR    Amiodarone  . History of hyperkalemia in setting of spironolactone 09/2010  . Sinus node dysfunction (HCC)   . Pacemaker     Implanted December 2012  . Obesity   . Allergic rhinitis   . Hypertension   . Senile cataract   . Hyperlipidemia   . Xerostomia   . Dysphagia   . Cough     2nd to reflux  . OSA (obstructive sleep apnea)   . Diverticulosis of colon   . Obesity hypoventilation syndrome ([place])   . Colon polyp   . Tracheobronchomalacia   . Diaphragm dysfunction     Right hemidiaphragm  . Anemia of renal disease 10/21/2011  . Anemia, iron deficiency 10/21/2011  . MDS (myelodysplastic syndrome), low grade ([place]) 10/21/2011  .  Unspecified vitamin D deficiency   . Dehydration   . Hypopotassemia   . Other specified disease of white blood cells   . Chronic diastolic heart failure ([place])   . Reflux esophagitis   . Cholelithiasis   . Osteoarthrosis, unspecified whether generalized or localized, unspecified site   . Disorder of bone and cartilage, unspecified   . Urinary incontinence   . Debility, unspecified   . Long term (current) use of anticoagulants   . Hypothyroidism 02/08/2013  . GERD (gastroesophageal reflux disease) 02/08/2013  . Depression   . Constipation   . Rosacea 05/15/2013    Past Surgical History  Procedure Laterality Date  . Tubal ligation  1964  . Total knee arthroplasty  1999    right knee  . Tummy tuck  2000    pt "almost died"; respiratory distress, became delrious  . Total knee arthroplasty  2003    left knee  . Cataract extraction  2008  . Pacemaker placement  10/21/2010    STJ, dural chamber Dr. [name]    Current Outpatient Prescriptions  Medication Sig Dispense Refill  . acetaminophen (TYLENOL) 325 MG tablet Take 650 mg by mouth every 4 (four) hours as needed.     . Aloe-Sodium Chloride (AYR SALINE NASAL GEL NA) Place into the nose. Apply topically to each nostril four times daily as needed for soreness and dryness    . amiodarone (PACERONE) 200 MG tablet Take  100 mg by mouth daily.     Marland Kitchen Dextromethorphan-Guaifenesin (MUCINEX DM) 30-600 MG TB12 Take by mouth. Take one tablet twice daily as needed for cough    . ezetimibe (ZETIA) 10 MG tablet Take 10 mg by mouth daily.    . fluticasone (FLONASE) 50 MCG/ACT nasal spray Place 2 sprays into the nose 2 (two) times daily. 2 sprays in each nostril    . hydrocortisone cream 1 % Apply topically as needed.    . Levothyroxine Sodium 75 MCG CAPS Take 1 mcg by mouth daily before breakfast.    . loratadine (CLARITIN) 10 MG tablet Take 10 mg by mouth daily.    . metroNIDAZOLE (METROGEL) 1 % gel Apply topically. Apply to rash on nose once daily      . NON FORMULARY 1.5-2 L/min. Oxygen    . polyethylene glycol powder (MIRALAX) powder Take 17 g by mouth daily. As directed    . potassium chloride SA (K-DUR,KLOR-CON) 20 MEQ tablet Take 40 mEq by mouth daily.    . Rivaroxaban (XARELTO) 15 MG TABS tablet Take 1 tablet (15 mg total) by mouth daily with supper. 30 tablet   . sodium chloride (OCEAN) 0.65 % SOLN nasal spray 1 spray. One spray into nostril every 2 hours as needed for nasal congestion    . torsemide (DEMADEX) 20 MG tablet Take 60 mg by mouth daily.      No current facility-administered medications for this visit.    Allergies:   Codeine; Iron; Morphine; and Toviaz   Social History:  The patient  reports that she quit smoking about 33 years ago. Her smoking use included Cigarettes. She smoked 0.50 packs per day. She has never used smokeless tobacco. She reports that she drinks about 0.6 oz of alcohol per week. She reports that she does not use illicit drugs.   Family History:  The patient's family history includes Heart disease in her brother and father.  ROS:  Please see the history of present illness.  All other systems are reviewed and otherwise negative.   PHYSICAL EXAM:  VS:  BP 124/76 mmHg  Pulse 77  Ht 5' 3.5" (1.613 m)  Wt 211 lb (95.709 kg)  BMI 36.79 kg/m2 BMI: Body mass index is 36.79 kg/(m^2). Very pleasant, obese WF, in no acute distress HEENT: normocephalic, atraumatic Neck: no JVD, carotid bruits or masses Cardiac:  normal S1, S2; RRR; no significant murmurs, no rubs, or gallops Lungs:  clear to auscultation bilaterally, no wheezing, rhonchi or rales Abd: soft, nontender MS: no deformity or atrophy Ext: no edema Skin: warm and dry, no rash Neuro:  No gross deficits appreciated Psych: euthymic mood, full affect  PPM site is stable, no tethering or discomfort   EKG:  Done today shows SR, ICRBBB, similar to previous PPM check today: normal function, battery status is good, 49% A paced, <1% V paced, AMS  noted, longest 6hrs July 2016, AT/AF burden <1%  09/08/10: Echocardiogram Study Conclusions Left ventricle: Systolic function was vigorous. The estimated ejection fraction was in the range of 65% to 70%  Recent Labs: 12//6/16: BUN 27, Creat 1.77, K+ 3.4 , H/H 11/34, plts 188, TSH 2.261, AST 10, ALT 7, LDL 120, HDL 54, Trigs 96 01/08/2015: BUN 26*; Creatinine 1.7*; Hemoglobin 11.1*; Platelets 188; Potassium 4.1; Sodium 144  No results found for requested labs within last 365 days.   CrCl cannot be calculated (Patient has no serum creatinine result on file.).   Wt Readings from Last 3 Encounters:  11/11/15 211 lb (95.709 kg)  09/09/15 212 lb (96.163 kg)  03/11/15 218 lb (98.884 kg)     Other studies reviewed: Additional studies/records reviewed today include: summarized above  DEVICE information: STJ PPM, implanted 10/21/10, Dr. [name]  ASSESSMENT AND PLAN:  1. PAFib, PPM     CHADS2vasc is at least 4, on Xarelto and amiodarone     Labs stable, Calc Creat Cl is 37.84 on renal dose xarelto     PPM functioning normally     q29mo remotes/Merlin     no bleeding/signs of bleeding     She has a pulmonologist she follows with  2. CHF (diastolic/normal EF)     appears euvolemic  3. Hypokalemia     Was addressed by her PMD  4. HLD     Being managed with her PMD      Disposition:Continue q3 month Merlin remote checks, f/u in-office 1 year with EP, Dr. [name].  Current medicines are reviewed at length with the patient today.  The patient did not have any concerns regarding medicines.  Haywood Lasso, PA-C 11/11/2015 12:03 PM     [address] [place] [address] [phone] (office)  [phone] (fax)

## 2015-11-11 ENCOUNTER — Encounter: Payer: Self-pay | Admitting: Internal Medicine

## 2015-11-11 ENCOUNTER — Ambulatory Visit (INDEPENDENT_AMBULATORY_CARE_PROVIDER_SITE_OTHER): Payer: Medicare Other | Admitting: Physician Assistant

## 2015-11-11 ENCOUNTER — Encounter: Payer: Self-pay | Admitting: Physician Assistant

## 2015-11-11 VITALS — BP 124/76 | HR 77 | Ht 63.5 in | Wt 211.0 lb

## 2015-11-11 DIAGNOSIS — I4891 Unspecified atrial fibrillation: Secondary | ICD-10-CM | POA: Diagnosis not present

## 2015-11-11 DIAGNOSIS — I495 Sick sinus syndrome: Secondary | ICD-10-CM | POA: Diagnosis not present

## 2015-11-11 DIAGNOSIS — I5032 Chronic diastolic (congestive) heart failure: Secondary | ICD-10-CM

## 2015-11-11 NOTE — Patient Instructions (Addendum)
Medication Instructions:   Your physician recommends that you continue on your current medications as directed. Please refer to the Current Medication list given to you today.   If you need a refill on your cardiac medications before your next appointment, please call your pharmacy.  Labwork:  NONE ORDER TODAY    Testing/Procedures:  NONE ORDER TODAY    Follow-Up:     Remote monitoring is used to monitor your Pacemaker of ICD from home. This monitoring reduces the number of office visits required to check your device to one time per year. It allows [place] to keep an eye on the functioning of your device to ensure it is working properly. You are scheduled for a device check from home on . 02/08/16.Marland KitchenMarland KitchenYou may send your transmission at any time that day. If you have a wireless device, the transmission will be sent automatically. After your physician reviews your transmission, you will receive a postcard with your next transmission date.   Your physician wants you to follow-up in: [place].  You will receive a reminder letter in the mail two months in advance. If you don't receive a letter, please call our office to schedule the follow-up appointment.    Any Other Special Instructions Will Be Listed Below (If Applicable).

## 2015-12-09 ENCOUNTER — Encounter: Payer: Self-pay | Admitting: Internal Medicine

## 2016-02-03 ENCOUNTER — Ambulatory Visit (INDEPENDENT_AMBULATORY_CARE_PROVIDER_SITE_OTHER): Payer: Medicare Other | Admitting: Pulmonary Disease

## 2016-02-03 ENCOUNTER — Encounter: Payer: Self-pay | Admitting: Pulmonary Disease

## 2016-02-03 VITALS — BP 128/76 | HR 71 | Ht 63.0 in | Wt 211.2 lb

## 2016-02-03 DIAGNOSIS — J9611 Chronic respiratory failure with hypoxia: Secondary | ICD-10-CM

## 2016-02-03 DIAGNOSIS — J398 Other specified diseases of upper respiratory tract: Secondary | ICD-10-CM | POA: Diagnosis not present

## 2016-02-03 DIAGNOSIS — G4733 Obstructive sleep apnea (adult) (pediatric): Secondary | ICD-10-CM | POA: Diagnosis not present

## 2016-02-03 NOTE — Progress Notes (Signed)
Current Outpatient Prescriptions on File Prior to Visit  Medication Sig  . acetaminophen (TYLENOL) 325 MG tablet Take 650 mg by mouth every 4 (four) hours as needed.   . Aloe-Sodium Chloride (AYR SALINE NASAL GEL NA) Place into the nose. Apply topically to each nostril four times daily as needed for soreness and dryness  . amiodarone (PACERONE) 200 MG tablet Take 100 mg by mouth daily.   Marland Kitchen Dextromethorphan-Guaifenesin (MUCINEX DM) 30-600 MG TB12 Take by mouth. Take one tablet twice daily as needed for cough  . ezetimibe (ZETIA) 10 MG tablet Take 10 mg by mouth daily.  . fluticasone (FLONASE) 50 MCG/ACT nasal spray Place 2 sprays into the nose 2 (two) times daily. 2 sprays in each nostril  . hydrocortisone cream 1 % Apply topically as needed.  . Levothyroxine Sodium 75 MCG CAPS Take 1 mcg by mouth daily before breakfast.  . loratadine (CLARITIN) 10 MG tablet Take 10 mg by mouth daily.  . metroNIDAZOLE (METROGEL) 1 % gel Apply topically. Apply to rash on nose once daily  . NON FORMULARY 1.5-2 L/min. Oxygen  . polyethylene glycol powder (MIRALAX) powder Take 17 g by mouth daily. As directed  . potassium chloride SA (K-DUR,KLOR-CON) 20 MEQ tablet Take 40 mEq by mouth daily.  . Rivaroxaban (XARELTO) 15 MG TABS tablet Take 1 tablet (15 mg total) by mouth daily with supper.  . sodium chloride (OCEAN) 0.65 % SOLN nasal spray 1 spray. One spray into nostril every 2 hours as needed for nasal congestion  . torsemide (DEMADEX) 20 MG tablet Take 60 mg by mouth daily.    No current facility-administered medications on file prior to visit.    Chief Complaint  Patient presents with  . Follow-up    Wears CPAP nightly. Denies problems with mask or pressure.  [facility]: [facility], [facility], [facility]    Tests PFT's March 29 2010 >> FEV1 1.34 (72) with ratio 75 and ERV 42, DLC0 55 > corrects to 137 CT chest 10/17/10>>GGO LUL, no PE, tracheobronchomalacia involving the visualized trachea with areas of air trapping seen throughout both  lungs PSG 12/14/10>>AHI 17.9, REM 40.8 BPAP titraton 01/12/11>>18/13 cm H2O with 3 liters oxygen. ONO on BiPAP 03/16/15 >> Test time 8 hrs 2 min. Mean SpO2 90.3%, low SpO2 70%. Spent 33 min with SpO2 < 88%. BiPAP 11/05/15 to 02/02/16 >> used on 90 of 90 nights with average 7 hrs 54 min.  Average AHI 8.5 with BiPAP 14/10 cm H2O  Past medical history PAF, s/p PM, HTN, Diastolic CHF, CKD, MDS, GERD, Hypothyroidism, Depression  Past surgical history, Family history, Social history, Allergies reviewed  Vital signs BP 128/76 mmHg  Pulse 71  Ht 5\' 3"  (1.6 m)  Wt 211 lb 3.2 oz (95.8 kg)  BMI 37.42 kg/m2  SpO2 95%    History of Present Illness: Cindy Cervantes is a 82 y.o. female former smoker with chronic hypoxic respiratory failure 2nd to OSA, tracheobronchomalacia, and Rt hemidiaphragm elevation.  She has been doing well.  No issues with BiPAP.  She uses 2 liters oxygen with BiPAP.  She denies chest pain, cough, or leg swelling.  She stays active, and doesn't feel like her breathing limits her activity level.   Physical Exam:  General - No distress ENT - No sinus tenderness, no oral exudate, no LAN Cardiac - s1s2 regular, no murmur Chest - decreased breath sounds Rt base, no wheeze/rales Back - No focal tenderness Abd - Soft, non-tender Ext - No edema Neuro - Normal strength  Skin - No rashes Psych - Normal mood, and behavior   Assessment/Plan:  Chronic hypoxic respiratory failure secondary to obstructive sleep apnea, tracheobronchomalacia, and Rt hemidiaphragm paralysis. - continue BiPAP 14/10 cm H2O with 2 liters oxygen   Patient Instructions  Follow up in 1 year     Chesley Mires, MD [facility] Pager:  [phone] 02/03/2016, 10:48 AM

## 2016-02-03 NOTE — Patient Instructions (Signed)
Follow up in 1 year.

## 2016-02-08 ENCOUNTER — Encounter: Payer: Self-pay | Admitting: Internal Medicine

## 2016-02-08 ENCOUNTER — Ambulatory Visit (INDEPENDENT_AMBULATORY_CARE_PROVIDER_SITE_OTHER): Payer: Medicare Other | Admitting: *Deleted

## 2016-02-08 DIAGNOSIS — I495 Sick sinus syndrome: Secondary | ICD-10-CM | POA: Diagnosis not present

## 2016-02-09 NOTE — Progress Notes (Signed)
Remote pacemaker transmission.   

## 2016-03-08 DIAGNOSIS — E785 Hyperlipidemia, unspecified: Secondary | ICD-10-CM | POA: Diagnosis not present

## 2016-03-08 DIAGNOSIS — E039 Hypothyroidism, unspecified: Secondary | ICD-10-CM | POA: Diagnosis not present

## 2016-03-08 DIAGNOSIS — D631 Anemia in chronic kidney disease: Secondary | ICD-10-CM | POA: Diagnosis not present

## 2016-03-08 DIAGNOSIS — I10 Essential (primary) hypertension: Secondary | ICD-10-CM | POA: Diagnosis not present

## 2016-03-08 DIAGNOSIS — I480 Paroxysmal atrial fibrillation: Secondary | ICD-10-CM | POA: Diagnosis not present

## 2016-03-08 DIAGNOSIS — D509 Iron deficiency anemia, unspecified: Secondary | ICD-10-CM | POA: Diagnosis not present

## 2016-03-08 DIAGNOSIS — R739 Hyperglycemia, unspecified: Secondary | ICD-10-CM | POA: Diagnosis not present

## 2016-03-08 LAB — CBC AND DIFFERENTIAL
HCT: 35 % — AB (ref 36–46)
Hemoglobin: 11.9 g/dL — AB (ref 12.0–16.0)
Platelets: 169 10*3/uL (ref 150–399)
WBC: 7.4 10^3/mL

## 2016-03-08 LAB — TSH: TSH: 3.25 u[IU]/mL (ref 0.41–5.90)

## 2016-03-08 LAB — LIPID PANEL
Cholesterol: 214 mg/dL — AB (ref 0–200)
HDL: 73 mg/dL — AB (ref 35–70)
LDL Cholesterol: 123 mg/dL
Triglycerides: 88 mg/dL (ref 40–160)

## 2016-03-08 LAB — HEPATIC FUNCTION PANEL
ALT: 6 U/L — AB (ref 7–35)
AST: 10 U/L — AB (ref 13–35)
Alkaline Phosphatase: 92 U/L (ref 25–125)
Bilirubin, Total: 0.4 mg/dL

## 2016-03-08 LAB — BASIC METABOLIC PANEL
BUN: 22 mg/dL — AB (ref 4–21)
Creatinine: 1.5 mg/dL — AB (ref 0.5–1.1)
Glucose: 85 mg/dL
Potassium: 4.2 mmol/L (ref 3.4–5.3)
Sodium: 145 mmol/L (ref 137–147)

## 2016-03-08 LAB — HEMOGLOBIN A1C: Hemoglobin A1C: 5.5

## 2016-03-09 ENCOUNTER — Encounter: Payer: Self-pay | Admitting: Internal Medicine

## 2016-03-09 ENCOUNTER — Non-Acute Institutional Stay: Payer: Medicare Other | Admitting: Internal Medicine

## 2016-03-09 VITALS — BP 120/70 | HR 72 | Temp 98.6°F | Ht 63.0 in | Wt 210.0 lb

## 2016-03-09 DIAGNOSIS — I480 Paroxysmal atrial fibrillation: Secondary | ICD-10-CM | POA: Diagnosis not present

## 2016-03-09 DIAGNOSIS — G250 Essential tremor: Secondary | ICD-10-CM | POA: Diagnosis not present

## 2016-03-09 DIAGNOSIS — I5032 Chronic diastolic (congestive) heart failure: Secondary | ICD-10-CM

## 2016-03-09 DIAGNOSIS — D46Z Other myelodysplastic syndromes: Secondary | ICD-10-CM

## 2016-03-09 DIAGNOSIS — Z0000 Encounter for general adult medical examination without abnormal findings: Secondary | ICD-10-CM | POA: Diagnosis not present

## 2016-03-09 DIAGNOSIS — R739 Hyperglycemia, unspecified: Secondary | ICD-10-CM | POA: Diagnosis not present

## 2016-03-09 DIAGNOSIS — E785 Hyperlipidemia, unspecified: Secondary | ICD-10-CM | POA: Diagnosis not present

## 2016-03-09 DIAGNOSIS — G4733 Obstructive sleep apnea (adult) (pediatric): Secondary | ICD-10-CM | POA: Diagnosis not present

## 2016-03-09 DIAGNOSIS — D509 Iron deficiency anemia, unspecified: Secondary | ICD-10-CM | POA: Diagnosis not present

## 2016-03-09 DIAGNOSIS — E039 Hypothyroidism, unspecified: Secondary | ICD-10-CM | POA: Diagnosis not present

## 2016-03-09 DIAGNOSIS — H6120 Impacted cerumen, unspecified ear: Secondary | ICD-10-CM | POA: Insufficient documentation

## 2016-03-09 DIAGNOSIS — D4620 Refractory anemia with excess of blasts, unspecified: Secondary | ICD-10-CM | POA: Diagnosis not present

## 2016-03-09 DIAGNOSIS — H6123 Impacted cerumen, bilateral: Secondary | ICD-10-CM

## 2016-03-09 NOTE — Progress Notes (Signed)
Location:  Occupational psychologist of Service:  Clinic (12) Provider: Zariah Jost L. [name], D.O., C.M.D.  Patient Care Team: Gayland Curry, DO as PCP - General (Geriatric Medicine) Mardene Celeste, NP as Nurse Practitioner (Geriatric Medicine) [facility] Chesley Mires, MD as Consulting Physician (Pulmonary Disease) Larey Dresser, MD as Consulting Physician (Cardiology) Lafayette Dragon, MD as Consulting Physician (Gastroenterology) Volanda Napoleon, MD as Consulting Physician (Oncology) Ninetta Lights, MD as Consulting Physician (Orthopedic Surgery) Sydnee Levans, MD as Consulting Physician (Dermatology) Prentiss Bells, MD as Consulting Physician (Ophthalmology)  Extended Emergency Contact Information Primary Emergency Contact: Gooding,Leslie Address: [place], [place] Phone: [phone] Relation: Daughter  Code Status: DNR Goals of Care: Advanced Directive information Advanced Directives 03/09/2016  Does patient have an advance directive? Yes  Type of Advance Directive Healthcare Power of Attorney  Copy of advanced directive(s) in chart? Yes   Chief Complaint  Patient presents with  . Annual Exam    wellness  . MMSE    [facility]/29/15 [facility] 30/30 passed clock    HPI: Patient is a 82 y.o. female seen in today for an annual wellness exam.   No new things have happened since her last appt.  Has lumps outside of rectum.  They interfere with bms some.  Hard to keep clean.  No bleeding, no pain.  Has had hemorrhoids.  Had 4 children in 5 years.  Previous ones burned, itched, hurt.    Feet burn and feels like they're swollen and they're not.  Skin feels tight.  She keeps them moving whether she wants to or not.  Has essential tremor.  Her brother and sister have Parkinson's (brother deceased).  She was told hers was not--only happens when stressed and trying to write and is nervous.      Depression screen [facility] 2/9 03/11/2015 03/03/2014  Decreased Interest 0 0  Down, Depressed, Hopeless 0 0  PHQ - 2 Score 0 0    Fall Risk  03/11/2015 03/03/2014  Falls in the past year? No Yes  Number falls in past yr: - 1  Injury with Fall? - No   MMSE - Mini Mental State Exam 10/29/2015  Orientation to time 5  Orientation to Place 5  Registration 3  Attention/ Calculation 5  Recall 3  Language- name 2 objects 2  Language- repeat 1  Language- follow 3 step command 3  Language- read & follow direction 1  Write a sentence 1  Copy design 1  Total score 30  passed clock   Health Maintenance  Topic Date Due  . TETANUS/TDAP  12/30/1952  . ZOSTAVAX  12/30/1993  . INFLUENZA VACCINE  05/03/2016  . DEXA SCAN  Completed  . PNA vac Low Risk Adult  Completed   Urinary incontinence?  No difficulty Functional Status Survey: Is the patient deaf or have difficulty hearing?: No Does the patient have difficulty seeing, even when wearing glasses/contacts?: No Does the patient have difficulty concentrating, remembering, or making decisions?: No Does the patient have difficulty walking or climbing stairs?: No Does the patient have difficulty dressing or bathing?: No Does the patient have difficulty doing errands alone such as visiting a doctor's office or shopping?: No Exercise?  Does walking for exercise, nothing else.   Diet?  Not following special diet.  Appetite not great--tired of same foods.  Has breakfast in her  room, lunch usually, but skips dinner.   No exam data presentsees Dr. Randol Kern for her eyes.  Is due to make appt. Hearing:  No difficulty Dentition:  No difficulty--sees Dr. Sabra Heck here.  Bites the inside of her mouth and gets sores on left side. Does not have many teeth on right bottom so balance off.   Pain:  No pain.    Past Medical History  Diagnosis Date  . Paroxysmal a-fib (HCC) with RVR    Amiodarone  . History of hyperkalemia in setting of spironolactone 09/2010  .  Sinus node dysfunction (HCC)   . Pacemaker     Implanted December 2012  . Obesity   . Allergic rhinitis   . Hypertension   . Senile cataract   . Hyperlipidemia   . Xerostomia   . Dysphagia   . Cough     2nd to reflux  . OSA (obstructive sleep apnea)   . Diverticulosis of colon   . Obesity hypoventilation syndrome ([place])   . Colon polyp   . Tracheobronchomalacia   . Diaphragm dysfunction     Right hemidiaphragm  . Anemia of renal disease 10/21/2011  . Anemia, iron deficiency 10/21/2011  . MDS (myelodysplastic syndrome), low grade ([place]) 10/21/2011  . Unspecified vitamin D deficiency   . Dehydration   . Hypopotassemia   . Other specified disease of white blood cells   . Chronic diastolic heart failure ([place])   . Reflux esophagitis   . Cholelithiasis   . Osteoarthrosis, unspecified whether generalized or localized, unspecified site   . Disorder of bone and cartilage, unspecified   . Urinary incontinence   . Debility, unspecified   . Long term (current) use of anticoagulants   . Hypothyroidism 02/08/2013  . GERD (gastroesophageal reflux disease) 02/08/2013  . Depression   . Constipation   . Rosacea 05/15/2013    Past Surgical History  Procedure Laterality Date  . Tubal ligation  1964  . Total knee arthroplasty  1999    right knee  . Tummy tuck  2000    pt "almost died"; respiratory distress, became delrious  . Total knee arthroplasty  2003    left knee  . Cataract extraction  2008  . Pacemaker placement  10/21/2010    STJ, dural chamber Dr. [name]   Social History   Social History  . Marital Status: Widowed    Spouse Name: N/A  . Number of Children: N/A  . Years of Education: N/A   Occupational History  . retired    Social History Main Topics  . Smoking status: Former Smoker -- 0.50 packs/day    Types: Cigarettes    Quit date: 10/03/1982  . Smokeless tobacco: Never Used  . Alcohol Use: 0.6 oz/week    1 Glasses of wine per week     Comment: 1 glass of wine every  Monday & Thursday evening  . Drug Use: No  . Sexual Activity: No   Other Topics Concern  . Not on file   Social History Narrative   Widowed 2002 (married 1955), originally form [place]. 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 Moved from [place], [place] to [place], [place] section 2009. Moved to [facility] section 2012.    Non smoker, mininmal alcohol.    Has POA   Power wheelchair, walker   Exercise none                   Allergies  Allergen Reactions  .  Codeine     Extreme lethargy  . Iron     By infusion  . Morphine   . Toviaz [Fesoterodine Fumarate]       Medication List       This list is accurate as of: 03/09/16  1:55 PM.  Always use your most recent med list.               amiodarone 200 MG tablet  Commonly known as:  PACERONE  Take 100 mg by mouth daily.     AYR SALINE NASAL GEL NA  Place into the nose. Apply topically to each nostril four times daily as needed for soreness and dryness     ezetimibe 10 MG tablet  Commonly known as:  ZETIA  Take 10 mg by mouth daily.     fluticasone 50 MCG/ACT nasal spray  Commonly known as:  FLONASE  Place 2 sprays into the nose 2 (two) times daily. 2 sprays in each nostril     hydrocortisone cream 1 %  Apply topically as needed.     Levothyroxine Sodium 75 MCG Caps  Take 1 mcg by mouth daily before breakfast.     loratadine 10 MG tablet  Commonly known as:  CLARITIN  Take 10 mg by mouth daily.     metroNIDAZOLE 1 % gel  Commonly known as:  METROGEL  Apply topically. Apply to rash on nose once daily     MIRALAX powder  Generic drug:  polyethylene glycol powder  Take 17 g by mouth daily. As directed     [place] DM 30-600 MG Tb12  Take by mouth. Take one tablet twice daily as needed for cough     potassium chloride SA 20 MEQ tablet  Commonly known as:  K-DUR,KLOR-CON  Take 40 mEq by mouth daily.     Rivaroxaban 15 MG Tabs tablet   Commonly known as:  XARELTO  Take 1 tablet (15 mg total) by mouth daily with supper.     sodium chloride 0.65 % Soln nasal spray  Commonly known as:  OCEAN  1 spray. One spray into nostril every 2 hours as needed for nasal congestion     torsemide 20 MG tablet  Commonly known as:  DEMADEX  Take 60 mg by mouth daily.     triamcinolone cream 0.1 %  Commonly known as:  KENALOG  Apply 1 application topically as needed.     TYLENOL 325 MG tablet  Generic drug:  acetaminophen  Take 650 mg by mouth every 4 (four) hours as needed.        Review of Systems:  Review of Systems  Constitutional: Negative for fever, chills and malaise/fatigue.  HENT: Negative for congestion and hearing loss.   Eyes: Negative for blurred vision.  Respiratory: Negative for shortness of breath.   Cardiovascular: Negative for chest pain, palpitations and leg swelling.  Gastrointestinal: Negative for abdominal pain, constipation, blood in stool and melena.  Genitourinary: Negative for dysuria, urgency and frequency.  Musculoskeletal: Negative for myalgias, joint pain and falls.  Skin: Negative for rash.  Neurological: Positive for tingling and sensory change. Negative for dizziness and weakness.       Bilateral soles of feet feel like skin is stretched  Psychiatric/Behavioral: Negative for depression and memory loss. The patient is not nervous/anxious and does not have insomnia.     Physical Exam: Filed Vitals:   03/09/16 1019  BP: 120/70  Pulse: 72  Temp: 98.6 F (37 C)  TempSrc: Oral  Height: 5\' 3"  (1.6 m)  Weight: 210 lb (95.255 kg)  SpO2: 98%   Body mass index is 37.21 kg/(m^2). Physical Exam  Constitutional: She is oriented to person, place, and time. She appears well-developed and well-nourished. No distress.  HENT:  Head: Normocephalic and atraumatic.  Right Ear: External ear normal.  Left Ear: External ear normal.  Nose: Nose normal.  Mouth/Throat: Oropharynx is clear and moist. No  oropharyngeal exudate.  Bilateral cerumen impaction  Eyes: Conjunctivae and EOM are normal. Pupils are equal, round, and reactive to light.  Neck: Normal range of motion. Neck supple. No JVD present. No thyromegaly present.  Cardiovascular: Normal rate, regular rhythm, normal heart sounds and intact distal pulses.   Pulmonary/Chest: Effort normal and breath sounds normal.  Abdominal: Soft. Bowel sounds are normal. She exhibits no distension and no mass. There is no tenderness.  Musculoskeletal: Normal range of motion.  Lymphadenopathy:    She has no cervical adenopathy.  Neurological: She is alert and oriented to person, place, and time.  Skin: Skin is warm and dry.  Sensation to monofilament intact on bilateral feet plantar surface  Psychiatric: She has a normal mood and affect.   Labs reviewed: Basic Metabolic Panel:  Recent Labs  03/08/16 0344  NA 145  K 4.2  BUN 22*  CREATININE 1.5*  TSH 3.25   Liver Function Tests:  Recent Labs  03/08/16 0344  AST 10*  ALT 6*  ALKPHOS 92   No results for input(s): LIPASE, AMYLASE in the last 8760 hours. No results for input(s): AMMONIA in the last 8760 hours. CBC:  Recent Labs  03/08/16 0344  WBC 7.4  HGB 11.9*  HCT 35*  PLT 169   Lipid Panel:  Recent Labs  03/08/16 0344  CHOL 214*  HDL 73*  LDLCALC 123  TRIG 88   Lab Results  Component Value Date   HGBA1C 5.5 03/08/2016    Assessment/Plan: 1. Annual physical exam -just had full labs -up to date on vaccines except tdap  2. Paroxysmal atrial fibrillation (HCC) -cont amiodarone, xarelto, stable w/o any episodes of RVR or symptoms of paroxysms -liver panel and tsh wnl as above  3. MDS (myelodysplastic syndrome), low grade (HCC) -again, cbc stable (h/h slightly improved), wbc and platelets normal at present, cont to monitor  4. Anemia, iron deficiency -cont to monitor cbc, h/h slightly improved from last visit, not on iron at this time either  5. OSA  treated with BiPAP -cont BiPAP therapy at hs, not having any problems  6. Chronic diastolic heart failure (HCC) -cont torsemide, no recent acute exacerbations, edema or dyspnea, doing well  7. Hypothyroidism, unspecified hypothyroidism type -TSH wnl, cont current synthroid and monitor  8. Hyperlipidemia -lipids fair, has protective HDL and is on zetia, refused statin therapy  9. Hyperglycemia -hba1c has improved, cont to walk for exercise and monitor  10. Tremor, essential -stable, no changes in gait or new symptoms to suggest another cause  11. Cerumen impaction, bilateral - left ear flushed, did not want right done as wax came out when she picked at it  Labs/tests ordered:  Cbc, bmp, hba1c, lipids Next appt:  6 mos med mgt  Kevyn Boquet L. Quinn Quam, D.O. [facility] [address], [place] [address] Cell Phone (Mon-Fri 8am-5pm):  [phone] On Call:  [phone] & follow prompts after 5pm & weekends Office Phone:  [phone] Office Fax:  [phone]

## 2016-03-16 ENCOUNTER — Encounter: Payer: Self-pay | Admitting: Cardiology

## 2016-03-16 LAB — CUP PACEART REMOTE DEVICE CHECK
Battery Remaining Percentage: 73 %
Brady Statistic AP VP Percent: 1 %
Brady Statistic AP VS Percent: 56 %
Brady Statistic RV Percent Paced: 1 %
Implantable Lead Implant Date: 20120119
Implantable Lead Location: 753859
Implantable Lead Location: 753860
Implantable Lead Model: 1948
Lead Channel Impedance Value: 430 Ohm
Lead Channel Pacing Threshold Amplitude: 0.5 V
Lead Channel Pacing Threshold Pulse Width: 0.5 ms
Lead Channel Pacing Threshold Pulse Width: 0.5 ms
Lead Channel Sensing Intrinsic Amplitude: 10.5 mV
Lead Channel Setting Pacing Amplitude: 1.5 V
Lead Channel Setting Pacing Amplitude: 2.5 V
MDC IDC LEAD IMPLANT DT: 20120119
MDC IDC MSMT BATTERY REMAINING LONGEVITY: 91 mo
MDC IDC MSMT BATTERY VOLTAGE: 2.92 V
MDC IDC MSMT LEADCHNL RA SENSING INTR AMPL: 1.3 mV
MDC IDC MSMT LEADCHNL RV IMPEDANCE VALUE: 730 Ohm
MDC IDC MSMT LEADCHNL RV PACING THRESHOLD AMPLITUDE: 0.75 V
MDC IDC PG SERIAL: 7198677
MDC IDC SESS DTM: 20170508132654
MDC IDC SET LEADCHNL RV PACING PULSEWIDTH: 0.5 ms
MDC IDC SET LEADCHNL RV SENSING SENSITIVITY: 2 mV
MDC IDC STAT BRADY AS VP PERCENT: 1 %
MDC IDC STAT BRADY AS VS PERCENT: 43 %
MDC IDC STAT BRADY RA PERCENT PACED: 55 %
Pulse Gen Model: 2210

## 2016-04-27 DIAGNOSIS — L821 Other seborrheic keratosis: Secondary | ICD-10-CM | POA: Diagnosis not present

## 2016-04-27 DIAGNOSIS — L918 Other hypertrophic disorders of the skin: Secondary | ICD-10-CM | POA: Diagnosis not present

## 2016-04-27 DIAGNOSIS — D485 Neoplasm of uncertain behavior of skin: Secondary | ICD-10-CM | POA: Diagnosis not present

## 2016-04-27 DIAGNOSIS — D1801 Hemangioma of skin and subcutaneous tissue: Secondary | ICD-10-CM | POA: Diagnosis not present

## 2016-04-27 DIAGNOSIS — C44321 Squamous cell carcinoma of skin of nose: Secondary | ICD-10-CM | POA: Diagnosis not present

## 2016-04-27 DIAGNOSIS — L718 Other rosacea: Secondary | ICD-10-CM | POA: Diagnosis not present

## 2016-04-27 DIAGNOSIS — D225 Melanocytic nevi of trunk: Secondary | ICD-10-CM | POA: Diagnosis not present

## 2016-04-27 DIAGNOSIS — L814 Other melanin hyperpigmentation: Secondary | ICD-10-CM | POA: Diagnosis not present

## 2016-05-10 ENCOUNTER — Ambulatory Visit (INDEPENDENT_AMBULATORY_CARE_PROVIDER_SITE_OTHER): Payer: Medicare Other | Admitting: *Deleted

## 2016-05-10 DIAGNOSIS — I495 Sick sinus syndrome: Secondary | ICD-10-CM | POA: Diagnosis not present

## 2016-05-10 NOTE — Progress Notes (Signed)
Remote pacemaker transmission.   

## 2016-05-11 ENCOUNTER — Encounter: Payer: Self-pay | Admitting: Cardiology

## 2016-05-17 LAB — CUP PACEART REMOTE DEVICE CHECK
Battery Remaining Longevity: 92 mo
Battery Remaining Percentage: 73 %
Battery Voltage: 2.92 V
Brady Statistic AP VP Percent: 1 %
Brady Statistic AP VS Percent: 58 %
Brady Statistic AS VS Percent: 41 %
Implantable Lead Implant Date: 20120119
Implantable Lead Location: 753860
Lead Channel Impedance Value: 430 Ohm
Lead Channel Impedance Value: 780 Ohm
Lead Channel Pacing Threshold Amplitude: 0.5 V
Lead Channel Pacing Threshold Amplitude: 0.75 V
Lead Channel Pacing Threshold Pulse Width: 0.5 ms
Lead Channel Sensing Intrinsic Amplitude: 2.1 mV
Lead Channel Setting Pacing Amplitude: 2.5 V
Lead Channel Setting Pacing Pulse Width: 0.5 ms
MDC IDC LEAD IMPLANT DT: 20120119
MDC IDC LEAD LOCATION: 753859
MDC IDC LEAD MODEL: 1948
MDC IDC MSMT LEADCHNL RA PACING THRESHOLD PULSEWIDTH: 0.5 ms
MDC IDC MSMT LEADCHNL RV SENSING INTR AMPL: 12 mV
MDC IDC PG SERIAL: 7198677
MDC IDC SESS DTM: 20170808063637
MDC IDC SET LEADCHNL RA PACING AMPLITUDE: 1.5 V
MDC IDC SET LEADCHNL RV SENSING SENSITIVITY: 2 mV
MDC IDC STAT BRADY AS VP PERCENT: 1 %
MDC IDC STAT BRADY RA PERCENT PACED: 57 %
MDC IDC STAT BRADY RV PERCENT PACED: 1 %

## 2016-05-18 DIAGNOSIS — C44321 Squamous cell carcinoma of skin of nose: Secondary | ICD-10-CM | POA: Diagnosis not present

## 2016-05-18 DIAGNOSIS — Z85828 Personal history of other malignant neoplasm of skin: Secondary | ICD-10-CM | POA: Diagnosis not present

## 2016-06-01 ENCOUNTER — Other Ambulatory Visit: Payer: Self-pay

## 2016-06-02 DIAGNOSIS — D509 Iron deficiency anemia, unspecified: Secondary | ICD-10-CM | POA: Diagnosis not present

## 2016-06-02 DIAGNOSIS — M6281 Muscle weakness (generalized): Secondary | ICD-10-CM | POA: Diagnosis not present

## 2016-06-02 DIAGNOSIS — E785 Hyperlipidemia, unspecified: Secondary | ICD-10-CM | POA: Diagnosis not present

## 2016-07-14 DIAGNOSIS — H5203 Hypermetropia, bilateral: Secondary | ICD-10-CM | POA: Diagnosis not present

## 2016-07-14 DIAGNOSIS — H524 Presbyopia: Secondary | ICD-10-CM | POA: Diagnosis not present

## 2016-07-14 DIAGNOSIS — H04123 Dry eye syndrome of bilateral lacrimal glands: Secondary | ICD-10-CM | POA: Diagnosis not present

## 2016-07-14 DIAGNOSIS — H52223 Regular astigmatism, bilateral: Secondary | ICD-10-CM | POA: Diagnosis not present

## 2016-07-21 DIAGNOSIS — Z23 Encounter for immunization: Secondary | ICD-10-CM | POA: Diagnosis not present

## 2016-08-09 ENCOUNTER — Ambulatory Visit (INDEPENDENT_AMBULATORY_CARE_PROVIDER_SITE_OTHER): Payer: Medicare Other | Admitting: *Deleted

## 2016-08-09 DIAGNOSIS — I495 Sick sinus syndrome: Secondary | ICD-10-CM

## 2016-08-09 NOTE — Progress Notes (Signed)
Remote pacemaker transmission.   

## 2016-08-10 ENCOUNTER — Encounter: Payer: Self-pay | Admitting: Cardiology

## 2016-09-06 DIAGNOSIS — E0865 Diabetes mellitus due to underlying condition with hyperglycemia: Secondary | ICD-10-CM | POA: Diagnosis not present

## 2016-09-06 DIAGNOSIS — E785 Hyperlipidemia, unspecified: Secondary | ICD-10-CM | POA: Diagnosis not present

## 2016-09-06 LAB — CUP PACEART REMOTE DEVICE CHECK
Battery Remaining Longevity: 92 mo
Battery Remaining Percentage: 73 %
Brady Statistic AP VS Percent: 59 %
Brady Statistic AS VP Percent: 1 %
Brady Statistic AS VS Percent: 40 %
Implantable Lead Implant Date: 20120119
Implantable Lead Model: 1948
Lead Channel Impedance Value: 430 Ohm
Lead Channel Pacing Threshold Amplitude: 0.75 V
Lead Channel Pacing Threshold Pulse Width: 0.5 ms
Lead Channel Sensing Intrinsic Amplitude: 1.7 mV
Lead Channel Sensing Intrinsic Amplitude: 12 mV
Lead Channel Setting Pacing Amplitude: 1.5 V
Lead Channel Setting Pacing Pulse Width: 0.5 ms
MDC IDC LEAD IMPLANT DT: 20120119
MDC IDC LEAD LOCATION: 753859
MDC IDC LEAD LOCATION: 753860
MDC IDC MSMT BATTERY VOLTAGE: 2.92 V
MDC IDC MSMT LEADCHNL RA PACING THRESHOLD AMPLITUDE: 0.5 V
MDC IDC MSMT LEADCHNL RA PACING THRESHOLD PULSEWIDTH: 0.5 ms
MDC IDC MSMT LEADCHNL RV IMPEDANCE VALUE: 790 Ohm
MDC IDC PG IMPLANT DT: 20120119
MDC IDC PG SERIAL: 7198677
MDC IDC SESS DTM: 20171107071239
MDC IDC SET LEADCHNL RV PACING AMPLITUDE: 2.5 V
MDC IDC SET LEADCHNL RV SENSING SENSITIVITY: 2 mV
MDC IDC STAT BRADY AP VP PERCENT: 1 %
MDC IDC STAT BRADY RA PERCENT PACED: 58 %
MDC IDC STAT BRADY RV PERCENT PACED: 1 %

## 2016-09-06 LAB — BASIC METABOLIC PANEL
BUN: 23 mg/dL — AB (ref 4–21)
Creatinine: 1.8 mg/dL — AB (ref 0.5–1.1)
Glucose: 103 mg/dL
Potassium: 4.1 mmol/L (ref 3.4–5.3)
Sodium: 145 mmol/L (ref 137–147)

## 2016-09-06 LAB — CBC AND DIFFERENTIAL
HCT: 37 % (ref 36–46)
Hemoglobin: 12 g/dL (ref 12.0–16.0)
Platelets: 185 10*3/uL (ref 150–399)
WBC: 8 10^3/mL

## 2016-09-06 LAB — LIPID PANEL
Cholesterol: 233 mg/dL — AB (ref 0–200)
HDL: 66 mg/dL (ref 35–70)
LDL Cholesterol: 148 mg/dL
Triglycerides: 96 mg/dL (ref 40–160)

## 2016-09-06 LAB — HEMOGLOBIN A1C: Hemoglobin A1C: 5.5

## 2016-09-07 ENCOUNTER — Other Ambulatory Visit: Payer: Self-pay | Admitting: Internal Medicine

## 2016-09-07 ENCOUNTER — Encounter: Payer: Self-pay | Admitting: Internal Medicine

## 2016-09-07 ENCOUNTER — Non-Acute Institutional Stay: Payer: Medicare Other | Admitting: Internal Medicine

## 2016-09-07 VITALS — BP 138/80 | HR 68 | Temp 97.8°F | Wt 209.0 lb

## 2016-09-07 DIAGNOSIS — R251 Tremor, unspecified: Secondary | ICD-10-CM

## 2016-09-07 DIAGNOSIS — R2689 Other abnormalities of gait and mobility: Secondary | ICD-10-CM

## 2016-09-07 DIAGNOSIS — R442 Other hallucinations: Secondary | ICD-10-CM | POA: Diagnosis not present

## 2016-09-07 DIAGNOSIS — G250 Essential tremor: Secondary | ICD-10-CM | POA: Diagnosis not present

## 2016-09-07 DIAGNOSIS — R4701 Aphasia: Secondary | ICD-10-CM

## 2016-09-07 NOTE — Progress Notes (Signed)
Location:  Occupational psychologist of Service:  Clinic (12)  Provider: Angeliyah Kirkey L. [name], D.O., C.M.D.  Code Status: DNR Goals of Care:  Advanced Directives 09/07/2016  Does Patient Have a Medical Advance Directive? Yes  Type of Advance Directive [place]  Does patient want to make changes to medical advance directive? -  Copy of [place] in Chart? Yes  Pre-existing out of facility DNR order (yellow form or pink MOST form) -     Chief Complaint  Patient presents with  . Medical Management of Chronic Issues    6 mth follow-up    HPI: Patient is a 82 y.o. female seen today for medical management of chronic diseases.    She is smelling things she knows are not there especially cigarette smoke.  Once in a while, she smells toast, but that could really be there.  She notes the cigarette smoke when they open the door to the kitchen while in the dining room and somtimes smells it in her room.  Really bothers her b/c she quit smoking after 30 years.  No one else smells the smoke when she does.    She is having a lot more trouble finding words--reports that she is about to say it and loses the word.  No other significant memory difficulty.  She went to visit a friend who does quilting and she could not think of "seam allowance".  She also has difficulty with names.  She notes it's worsening so this worries her.    Tremor is getting worse.  Has essential tremor.  Her mother's also got much worse.  Also has a brother with PD and a sister with PD.    She is slightly more unsteady than she used to be.  Feels like she stumbles a little if she is not careful.  Does not use her rollator in the room--furniture surfs in there.  Hard to fit the walker through.  Previously has had very good balance.  If she goes to step off the curb, she needs something to hold onto.  Does not think she always falls the same direction when she stumbles, but maybe  more to the left.    Outside of those, she feels good.  She's a little lightheaded this am.    She ate a lot of candy just before her labs were done so cholesterol was way up.  Otherwise not eating bad food.  Not eating cheese or eggs regularly.    Past Medical History:  Diagnosis Date  . Allergic rhinitis   . Anemia of renal disease 10/21/2011  . Anemia, iron deficiency 10/21/2011  . Cholelithiasis   . Chronic diastolic heart failure ([place])   . Colon polyp   . Constipation   . Cough    2nd to reflux  . Debility, unspecified   . Dehydration   . Depression   . Diaphragm dysfunction    Right hemidiaphragm  . Disorder of bone and cartilage, unspecified   . Diverticulosis of colon   . Dysphagia   . GERD (gastroesophageal reflux disease) 02/08/2013  . History of hyperkalemia in setting of spironolactone 09/2010  . Hyperlipidemia   . Hypertension   . Hypopotassemia   . Hypothyroidism 02/08/2013  . Long term (current) use of anticoagulants   . MDS (myelodysplastic syndrome), low grade ([place]) 10/21/2011  . Obesity   . Obesity hypoventilation syndrome ([place])   . OSA (obstructive sleep apnea)   .  Osteoarthrosis, unspecified whether generalized or localized, unspecified site   . Other specified disease of white blood cells   . Pacemaker    Implanted December 2012  . Paroxysmal a-fib (HCC) with RVR   Amiodarone  . Reflux esophagitis   . Rosacea 05/15/2013  . Senile cataract   . Sinus node dysfunction (HCC)   . Tracheobronchomalacia   . Unspecified vitamin D deficiency   . Urinary incontinence   . Xerostomia     Past Surgical History:  Procedure Laterality Date  . CATARACT EXTRACTION  2008  . PACEMAKER PLACEMENT  10/21/2010   STJ, dural chamber Dr. [name]  . TOTAL KNEE ARTHROPLASTY  1999   right knee  . TOTAL KNEE ARTHROPLASTY  2003   left knee  . TUBAL LIGATION  1964  . Tummy tuck  2000   pt "almost died"; respiratory distress, became delrious    Allergies  Allergen  Reactions  . Codeine     Extreme lethargy  . Iron     By infusion  . Morphine   . Toviaz [Fesoterodine Fumarate]       Medication List       Accurate as of 09/07/16 11:09 AM. Always use your most recent med list.          amiodarone 200 MG tablet Commonly known as:  PACERONE Take 100 mg by mouth daily.   AYR SALINE NASAL GEL NA Place into the nose. Apply topically to each nostril four times daily as needed for soreness and dryness   ezetimibe 10 MG tablet Commonly known as:  ZETIA Take 10 mg by mouth daily.   fluticasone 50 MCG/ACT nasal spray Commonly known as:  FLONASE Place 2 sprays into the nose 2 (two) times daily. 2 sprays in each nostril   hydrocortisone cream 1 % Apply topically as needed.   Levothyroxine Sodium 75 MCG Caps Take 1 mcg by mouth daily before breakfast.   loratadine 10 MG tablet Commonly known as:  CLARITIN Take 10 mg by mouth daily.   metroNIDAZOLE 1 % gel Commonly known as:  METROGEL Apply topically. Apply to rash on nose once daily   MIRALAX powder Generic drug:  polyethylene glycol powder Take 17 g by mouth daily. As directed   [place] DM 30-600 MG Tb12 Take by mouth. Take one tablet twice daily as needed for cough   potassium chloride SA 20 MEQ tablet Commonly known as:  K-DUR,KLOR-CON Take 40 mEq by mouth daily.   Rivaroxaban 15 MG Tabs tablet Commonly known as:  XARELTO Take 1 tablet (15 mg total) by mouth daily with supper.   sodium chloride 0.65 % Soln nasal spray Commonly known as:  OCEAN 1 spray. One spray into nostril every 2 hours as needed for nasal congestion   torsemide 20 MG tablet Commonly known as:  DEMADEX Take 60 mg by mouth daily.   triamcinolone cream 0.1 % Commonly known as:  KENALOG Apply 1 application topically as needed.   TYLENOL 325 MG tablet Generic drug:  acetaminophen Take 650 mg by mouth every 4 (four) hours as needed.       Review of Systems:  Review of Systems  Constitutional:  Negative for chills and fever.  HENT:       Gustatory rhinitis  Eyes: Negative for blurred vision.       Got new eyeglass Rx and wearing them all of the time now  Respiratory: Negative for cough and shortness of breath.   Cardiovascular: Negative for chest  pain, palpitations and leg swelling.  Gastrointestinal: Negative for abdominal pain, blood in stool, constipation and melena.  Genitourinary: Negative for dysuria.  Musculoskeletal: Negative for falls and joint pain.  Skin: Negative for itching and rash.  Neurological: Positive for dizziness and tremors. Negative for tingling, sensory change and loss of consciousness.       Some difficulty remembering words when speaking  Endo/Heme/Allergies: Bruises/bleeds easily.  Psychiatric/Behavioral: Negative for depression and memory loss. The patient is not nervous/anxious and does not have insomnia.     Health Maintenance  Topic Date Due  . TETANUS/TDAP  12/30/1952  . ZOSTAVAX  12/30/1993  . INFLUENZA VACCINE  Completed  . DEXA SCAN  Completed  . PNA vac Low Risk Adult  Completed    Physical Exam: Vitals:   09/07/16 1057  BP: 138/80  Pulse: 68  Temp: 97.8 F (36.6 C)  TempSrc: Oral  SpO2: 96%  Weight: 209 lb (94.8 kg)   Body mass index is 37.02 kg/m. Physical Exam  Constitutional: She is oriented to person, place, and time. She appears well-developed and well-nourished. No distress.  HENT:  Head: Normocephalic and atraumatic.  Cardiovascular: Normal rate, regular rhythm, normal heart sounds and intact distal pulses.   Pulmonary/Chest: Effort normal and breath sounds normal. No respiratory distress.  Musculoskeletal: Normal range of motion. She exhibits no tenderness or deformity.  Neurological: She is alert and oriented to person, place, and time. No cranial nerve deficit. Coordination normal.  Balance appears normal, no drift, no abnormalities with cerebellar testing; walks w/o assistive device; bilateral TKAs and cannot  obtain DTRs  Skin: Skin is warm and dry.  Psychiatric: She has a normal mood and affect.    Labs reviewed: Basic Metabolic Panel:  Recent Labs  03/08/16 0344 09/06/16 0300  NA 145 145  K 4.2 4.1  BUN 22* 23*  CREATININE 1.5* 1.8*  TSH 3.25  --    Liver Function Tests:  Recent Labs  03/08/16 0344  AST 10*  ALT 6*  ALKPHOS 92   No results for input(s): LIPASE, AMYLASE in the last 8760 hours. No results for input(s): AMMONIA in the last 8760 hours. CBC:  Recent Labs  03/08/16 0344 09/06/16 0300  WBC 7.4 8.0  HGB 11.9* 12.0  HCT 35* 37  PLT 169 185   Lipid Panel:  Recent Labs  03/08/16 0344 09/06/16 0300  CHOL 214* 233*  HDL 73* 66  LDLCALC 123 148  TRIG 88 96   Lab Results  Component Value Date   HGBA1C 5.5 09/06/2016     Assessment/Plan 1. Aphasia -no obvious symptoms of this during visit with me, but pt also very intelligent and educated   2. Tremor, essential -has worsened lately  3. Balance problem -noting some falling over toward the side when walking which is new for her  4. Olfactory hallucination -new and concerning to her--she is worried about a brain tumor; in combination with the above, will obtain CT brain to rule out any obvious pathology not noted during my examination -DTRs could not be elicited due to TKAs  Labs/tests ordered:  CT brain w/o contrast Next appt:  6 mos [place]. Gergory Biello, D.O. [facility] [address], [place] [address] Cell Phone (Mon-Fri 8am-5pm):  [phone] On Call:  [phone] & follow prompts after 5pm & weekends Office Phone:  [phone] Office Fax:  [phone]

## 2016-09-12 ENCOUNTER — Ambulatory Visit
Admission: RE | Admit: 2016-09-12 | Discharge: 2016-09-12 | Disposition: A | Discharge status: Home / Self Care | Payer: Medicare Other | Source: Ambulatory Visit | Attending: Internal Medicine | Admitting: Internal Medicine

## 2016-09-12 DIAGNOSIS — R442 Other hallucinations: Secondary | ICD-10-CM

## 2016-09-12 DIAGNOSIS — R4701 Aphasia: Secondary | ICD-10-CM

## 2016-09-12 DIAGNOSIS — R251 Tremor, unspecified: Secondary | ICD-10-CM

## 2016-09-12 DIAGNOSIS — R2689 Other abnormalities of gait and mobility: Secondary | ICD-10-CM

## 2016-09-12 DIAGNOSIS — R443 Hallucinations, unspecified: Secondary | ICD-10-CM | POA: Diagnosis not present

## 2016-11-11 ENCOUNTER — Encounter: Payer: Self-pay | Admitting: Internal Medicine

## 2016-11-11 ENCOUNTER — Ambulatory Visit (INDEPENDENT_AMBULATORY_CARE_PROVIDER_SITE_OTHER): Payer: Medicare Other | Admitting: Internal Medicine

## 2016-11-11 VITALS — BP 150/70 | HR 77 | Ht 64.0 in | Wt 208.0 lb

## 2016-11-11 DIAGNOSIS — I495 Sick sinus syndrome: Secondary | ICD-10-CM

## 2016-11-11 DIAGNOSIS — Z79899 Other long term (current) drug therapy: Secondary | ICD-10-CM | POA: Diagnosis not present

## 2016-11-11 DIAGNOSIS — I480 Paroxysmal atrial fibrillation: Secondary | ICD-10-CM | POA: Diagnosis not present

## 2016-11-11 DIAGNOSIS — Z950 Presence of cardiac pacemaker: Secondary | ICD-10-CM

## 2016-11-11 NOTE — Patient Instructions (Signed)
Medication Instructions: - Your physician recommends that you continue on your current medications as directed. Please refer to the Current Medication list given to you today.  Labwork: - Your physician recommends that you have lab work today: TSH/ Liver  Procedures/Testing: - none ordered  Follow-Up: - Remote monitoring is used to monitor your Pacemaker of ICD from home. This monitoring reduces the number of office visits required to check your device to one time per year. It allows [place] to keep an eye on the functioning of your device to ensure it is working properly. You are scheduled for a device check from home on 02/09/17. You may send your transmission at any time that day. If you have a wireless device, the transmission will be sent automatically. After your physician reviews your transmission, you will receive a postcard with your next transmission date.  - Your physician wants you to follow-up in: 6 months with Tommye Standard, PA for Dr. [name]. You will receive a reminder letter in the mail two months in advance. If you don't receive a letter, please call our office to schedule the follow-up appointment.  Any Additional Special Instructions Will Be Listed Below (If Applicable).     If you need a refill on your cardiac medications before your next appointment, please call your pharmacy.

## 2016-11-11 NOTE — Progress Notes (Signed)
Maybe as long as calling [place]    Cardiology Office Note Date:  11/11/2016  Patient ID:  Cindy Cervantes, Cindy Cervantes Feb 12, 1932, MRN [id number] PCP:  Hollace Kinnier, DO  Electrophysiology: Dr. [name] [facility] Chesley Mires, MD as Consulting Physician (Pulmonary Disease) Volanda Napoleon, MD as Consulting Physician (Oncology) Trixie Rude., MD as Consulting Physician (Ophthalmology)   Chief Complaint:  Routine EP, pacer visit  History of Present Illness: Cindy Cervantes is a 83 y.o. female  Seen in followup for her paroxysmal atrial fibrillation with a rapid ventricular response and sinus node dysfunction for which she required pacemaker implantation in the fall of 2011.   She takes amiodarone. She has tolerated this quite well.  .  She is in assisted living and doing much better. She is no longer using oxygen. No bleeding issues on Rivaroxaban          Date TSH LFTs PFTs  6/17  3.25 6       There has been mo intercurrent atrial fibrillation  Past Medical History:  Diagnosis Date  . Allergic rhinitis   . Anemia of renal disease 10/21/2011  . Anemia, iron deficiency 10/21/2011  . Cholelithiasis   . Chronic diastolic heart failure ([place])   . Colon polyp   . Constipation   . Cough    2nd to reflux  . Debility, unspecified   . Dehydration   . Depression   . Diaphragm dysfunction    Right hemidiaphragm  . Disorder of bone and cartilage, unspecified   . Diverticulosis of colon   . Dysphagia   . GERD (gastroesophageal reflux disease) 02/08/2013  . History of hyperkalemia in setting of spironolactone 09/2010  . Hyperlipidemia   . Hypertension   . Hypopotassemia   . Hypothyroidism 02/08/2013  . Long term (current) use of anticoagulants   . MDS (myelodysplastic syndrome), low grade ([place]) 10/21/2011  . Obesity   . Obesity hypoventilation syndrome ([place])   . OSA (obstructive sleep apnea)   . Osteoarthrosis, unspecified whether generalized or localized,  unspecified site   . Other specified disease of white blood cells   . Pacemaker    Implanted December 2012  . Paroxysmal a-fib (HCC) with RVR   Amiodarone  . Reflux esophagitis   . Rosacea 05/15/2013  . Senile cataract   . Sinus node dysfunction (HCC)   . Tracheobronchomalacia   . Unspecified vitamin D deficiency   . Urinary incontinence   . Xerostomia     Past Surgical History:  Procedure Laterality Date  . CATARACT EXTRACTION  2008  . PACEMAKER PLACEMENT  10/21/2010   STJ, dural chamber Dr. [name]  . TOTAL KNEE ARTHROPLASTY  1999   right knee  . TOTAL KNEE ARTHROPLASTY  2003   left knee  . TUBAL LIGATION  1964  . Tummy tuck  2000   pt "almost died"; respiratory distress, became delrious    Current Outpatient Prescriptions  Medication Sig Dispense Refill  . acetaminophen (TYLENOL) 325 MG tablet Take 650 mg by mouth every 4 (four) hours as needed.     . [place]-Sodium Chloride (AYR SALINE NASAL GEL NA) Place into the nose. Apply topically to each nostril four times daily as needed for soreness and dryness    . amiodarone (PACERONE) 200 MG tablet Take 100 mg by mouth daily.     Marland Kitchen Dextromethorphan-Guaifenesin (MUCINEX DM) 30-600 MG TB12 Take by mouth. Take one tablet twice daily as needed for cough    .  ezetimibe (ZETIA) 10 MG tablet Take 10 mg by mouth daily.    . fluticasone (FLONASE) 50 MCG/ACT nasal spray Place 2 sprays into the nose 2 (two) times daily. 2 sprays in each nostril    . hydrocortisone cream 1 % Apply topically as needed.    . Levothyroxine Sodium 75 MCG CAPS Take 1 mcg by mouth daily before breakfast.    . loratadine (CLARITIN) 10 MG tablet Take 10 mg by mouth daily.    . metroNIDAZOLE (METROGEL) 1 % gel Apply topically. Apply to rash on nose once daily    . polyethylene glycol powder (MIRALAX) powder Take 17 g by mouth daily. As directed    . potassium chloride SA (K-DUR,KLOR-CON) 20 MEQ tablet Take 40 mEq by mouth daily.    . Rivaroxaban (XARELTO) 15 MG TABS  tablet Take 1 tablet (15 mg total) by mouth daily with supper. 30 tablet   . sodium chloride (OCEAN) 0.65 % SOLN nasal spray 1 spray. One spray into nostril every 2 hours as needed for nasal congestion    . torsemide (DEMADEX) 20 MG tablet Take 60 mg by mouth daily.     Marland Kitchen triamcinolone cream (KENALOG) 0.1 % Apply 1 application topically as needed.     No current facility-administered medications for this visit.     Allergies:   Codeine; Iron; Morphine; and Toviaz [fesoterodine fumarate]   Social History:  The patient  reports that she quit smoking about 34 years ago. Her smoking use included Cigarettes. She smoked 0.50 packs per day. She has never used smokeless tobacco. She reports that she drinks about 0.6 oz of alcohol per week . She reports that she does not use drugs.   Family History:  The patient's family history includes Heart disease in her brother and father.  ROS:  Please see the history of present illness.  All other systems are reviewed and otherwise negative.   PHYSICAL EXAM:  VS:  BP (!) 150/70   Pulse 77   Ht 5\' 4"  (1.626 m)   Wt 208 lb (94.3 kg)   SpO2 98%   BMI 35.70 kg/m  BMI: Body mass index is 35.7 kg/m. Very pleasant, obese WF, in no acute distress  HEENT: normocephalic, atraumatic  Neck: no JVD,   Cardiac:  normal S1, S2; RRR; no significant murmurs, no rubs, or gallops Lungs:  clear to auscultation bilaterally, no wheezing, rhonchi or rales  Abd: soft, nontender MS: no deformity or atrophy Ext: no edema  Skin: warm and dry, no rash Neuro:  No gross deficits appreciated Psych: euthymic mood, full affect  PPM site is stable, no tethering or discomfort   EKG:  Sinus rhythm at 74 intervals 16/10/40 Incomplete right bundle branch block  Recent Labs: 12//6/16: BUN 27, Creat 1.77, K+ 3.4 , H/H 11/34, plts 188, TSH 2.261, AST 10, ALT 7, LDL 120, HDL 54, Trigs 96 03/08/2016: ALT 6; TSH 3.25 09/06/2016: BUN 23; Creatinine 1.8; Hemoglobin 12.0; Platelets 185;  Potassium 4.1; Sodium 145  09/06/2016: Cholesterol 233; HDL 66; LDL Cholesterol 148; Triglycerides 96   CrCl cannot be calculated (Patient's most recent lab result is older than the maximum 21 days allowed.).   Wt Readings from Last 3 Encounters:  11/11/16 208 lb (94.3 kg)  09/07/16 209 lb (94.8 kg)  03/09/16 210 lb (95.3 kg)     Other studies reviewed: Additional studies/records reviewed today include: summarized above  DEVICE information: STJ PPM, implanted 10/21/10, Dr. [name]  ASSESSMENT AND PLAN:  Pacemaker-[employer] The patient's device was interrogated.  The information was reviewed. No changes were made in the programming.    Atrial fibrillation-paroxysmal  HFpEF  Treated hypothyroidism  Infrequent atrial fibrillation  On Anticoagulation;  No bleeding issues   Amiodarone surveillance labs will b on him going to go talk to 3 before we do e checked today   Virl Axe  11/11/2016 12:41 PM     [facility] [address] [place] [address] [phone] (office)  [phone] (fax)

## 2016-11-12 LAB — HEPATIC FUNCTION PANEL
ALBUMIN: 4.1 g/dL (ref 3.5–4.7)
ALK PHOS: 103 IU/L (ref 39–117)
ALT: 11 IU/L (ref 0–32)
AST: 16 IU/L (ref 0–40)
BILIRUBIN TOTAL: 0.4 mg/dL (ref 0.0–1.2)
BILIRUBIN, DIRECT: 0.14 mg/dL (ref 0.00–0.40)
TOTAL PROTEIN: 7.2 g/dL (ref 6.0–8.5)

## 2016-11-12 LAB — TSH: TSH: 2.33 u[IU]/mL (ref 0.450–4.500)

## 2016-11-18 DIAGNOSIS — M1000 Idiopathic gout, unspecified site: Secondary | ICD-10-CM | POA: Diagnosis not present

## 2016-11-18 LAB — CUP PACEART INCLINIC DEVICE CHECK
Battery Voltage: 2.92 V
Brady Statistic RA Percent Paced: 59 %
Implantable Lead Implant Date: 20120119
Implantable Lead Location: 753860
Implantable Pulse Generator Implant Date: 20120119
Lead Channel Impedance Value: 437.5 Ohm
Lead Channel Pacing Threshold Amplitude: 0.5 V
Lead Channel Pacing Threshold Pulse Width: 0.5 ms
Lead Channel Pacing Threshold Pulse Width: 0.5 ms
Lead Channel Setting Pacing Amplitude: 2.5 V
Lead Channel Setting Pacing Pulse Width: 0.5 ms
MDC IDC LEAD IMPLANT DT: 20120119
MDC IDC LEAD LOCATION: 753859
MDC IDC MSMT LEADCHNL RA SENSING INTR AMPL: 1 mV
MDC IDC MSMT LEADCHNL RV IMPEDANCE VALUE: 787.5 Ohm
MDC IDC MSMT LEADCHNL RV PACING THRESHOLD AMPLITUDE: 0.75 V
MDC IDC MSMT LEADCHNL RV SENSING INTR AMPL: 12 mV
MDC IDC SESS DTM: 20180209182402
MDC IDC SET LEADCHNL RA PACING AMPLITUDE: 1.5 V
MDC IDC SET LEADCHNL RV SENSING SENSITIVITY: 2 mV
MDC IDC STAT BRADY RV PERCENT PACED: 0.53 %
Pulse Gen Serial Number: 7198677

## 2016-11-24 DIAGNOSIS — G629 Polyneuropathy, unspecified: Secondary | ICD-10-CM | POA: Diagnosis not present

## 2016-12-26 ENCOUNTER — Non-Acute Institutional Stay: Payer: Medicare Other | Admitting: Adult Health

## 2016-12-26 DIAGNOSIS — R1013 Epigastric pain: Secondary | ICD-10-CM

## 2016-12-26 DIAGNOSIS — Z79899 Other long term (current) drug therapy: Secondary | ICD-10-CM | POA: Diagnosis not present

## 2016-12-26 DIAGNOSIS — I5032 Chronic diastolic (congestive) heart failure: Secondary | ICD-10-CM | POA: Diagnosis not present

## 2016-12-26 DIAGNOSIS — R1010 Upper abdominal pain, unspecified: Secondary | ICD-10-CM | POA: Diagnosis not present

## 2016-12-26 DIAGNOSIS — D649 Anemia, unspecified: Secondary | ICD-10-CM | POA: Diagnosis not present

## 2016-12-26 DIAGNOSIS — R630 Anorexia: Secondary | ICD-10-CM

## 2016-12-26 DIAGNOSIS — K219 Gastro-esophageal reflux disease without esophagitis: Secondary | ICD-10-CM

## 2016-12-26 DIAGNOSIS — I4891 Unspecified atrial fibrillation: Secondary | ICD-10-CM | POA: Diagnosis not present

## 2016-12-26 LAB — HEPATIC FUNCTION PANEL
ALT: 7 U/L (ref 7–35)
AST: 13 U/L (ref 13–35)
Alkaline Phosphatase: 89 U/L (ref 25–125)
Bilirubin, Total: 0.6 mg/dL

## 2016-12-26 LAB — CBC AND DIFFERENTIAL
HCT: 35 % — AB (ref 36–46)
Hemoglobin: 11.7 g/dL — AB (ref 12.0–16.0)
Platelets: 209 10*3/uL (ref 150–399)
WBC: 8.3 10^3/mL

## 2016-12-26 LAB — BASIC METABOLIC PANEL
BUN: 20 mg/dL (ref 4–21)
Creatinine: 1.6 mg/dL — AB (ref 0.5–1.1)
Glucose: 96 mg/dL
Potassium: 4.1 mmol/L (ref 3.4–5.3)
Sodium: 144 mmol/L (ref 137–147)

## 2016-12-26 NOTE — Progress Notes (Signed)
Location:  Occupational psychologist of Service:  ALF (13) Provider:   Cindi Carbon, ANP [place] [phone]  [name], Jonelle Sidle, DO  Patient Care Team: Gayland Curry, DO as PCP - General (Geriatric Medicine) Mardene Celeste, NP as Nurse Practitioner (Geriatric Medicine) [facility] Chesley Mires, MD as Consulting Physician (Pulmonary Disease) Larey Dresser, MD as Consulting Physician (Cardiology) [place] Dragon, MD (Inactive) as Consulting Physician (Gastroenterology) Volanda Napoleon, MD as Consulting Physician (Oncology) Ninetta Lights, MD as Consulting Physician (Orthopedic Surgery) Sydnee Levans, MD as Consulting Physician (Dermatology) Prentiss Bells, MD as Consulting Physician (Ophthalmology)  Extended Emergency Contact Information Primary Emergency Contact: Gooding,Leslie Address: [address]          [place], [place] Phone: [phone] Relation: Daughter  Code Status:  Full code Goals of care: Advanced Directive information Advanced Directives 09/07/2016  Does Patient Have a Medical Advance Directive? Yes  Type of Advance Directive [place]  Does patient want to make changes to medical advance directive? -  Copy of [place] in Chart? Yes  Pre-existing out of facility DNR order (yellow form or pink MOST form) -     Chief Complaint  Patient presents with  . Acute Visit    epigastric, RUQ pain with meals    HPI:  Pt is a 83 y.o. female seen today for an acute visit for epigastric and RUQ pain associated with meals. The pain has been present on and off for two weeks with decreased appetite. She only ate popcorn for lunch yesterday and a few eggs for dinner. She has not had a fever, or vomiting but has had some nausea after eating.  The pain is described as a tight feeling in the epigastric area and RUQ that goes around to the back and feels  like a tight ball in the center of her back. She states it feels better after ambulating.  She reports regular BMs.  She was started on allopurinol for hyperuricemia (uric acid 8.1) two weeks ago and she wonders if this could be causing the problem.   She has a hx of cholelithiasis.  Family hx of gall bladder issues.   Has a pacemaker with hx of afib. No typical angina symptoms, no shortness breath at rest or exertion.  Has elevated right diaphragm.     Past Medical History:  Diagnosis Date  . Allergic rhinitis   . Anemia of renal disease 10/21/2011  . Anemia, iron deficiency 10/21/2011  . Cholelithiasis   . Chronic diastolic heart failure ([place])   . Colon polyp   . Constipation   . Cough    2nd to reflux  . Debility, unspecified   . Dehydration   . Depression   . Diaphragm dysfunction    Right hemidiaphragm  . Disorder of bone and cartilage, unspecified   . Diverticulosis of colon   . Dysphagia   . GERD (gastroesophageal reflux disease) 02/08/2013  . History of hyperkalemia in setting of spironolactone 09/2010  . Hyperlipidemia   . Hypertension   . Hypopotassemia   . Hypothyroidism 02/08/2013  . Long term (current) use of anticoagulants   . MDS (myelodysplastic syndrome), low grade ([place]) 10/21/2011  . Obesity   . Obesity hypoventilation syndrome ([place])   . OSA (obstructive sleep apnea)   . Osteoarthrosis, unspecified whether generalized or localized, unspecified site   . Other specified disease of white blood cells   .  Pacemaker    Implanted December 2012  . Paroxysmal a-fib (HCC) with RVR   Amiodarone  . Reflux esophagitis   . Rosacea 05/15/2013  . Senile cataract   . Sinus node dysfunction (HCC)   . Tracheobronchomalacia   . Unspecified vitamin D deficiency   . Urinary incontinence   . Xerostomia    Past Surgical History:  Procedure Laterality Date  . CATARACT EXTRACTION  2008  . PACEMAKER PLACEMENT  10/21/2010   STJ, dural chamber Dr. [name]  . TOTAL KNEE ARTHROPLASTY   1999   right knee  . TOTAL KNEE ARTHROPLASTY  2003   left knee  . TUBAL LIGATION  1964  . Tummy tuck  2000   pt "almost died"; respiratory distress, became delrious    Allergies  Allergen Reactions  . Codeine     Extreme lethargy  . Iron     By infusion  . Morphine   . Toviaz [Fesoterodine Fumarate]     Allergies as of 12/26/2016      Reactions   Codeine    Extreme lethargy   Iron    By infusion   Morphine    Toviaz [fesoterodine Fumarate]       Medication List       Accurate as of 12/26/16 10:09 AM. Always use your most recent med list.          allopurinol 100 MG tablet Commonly known as:  ZYLOPRIM Take 100 mg by mouth daily.   amiodarone 200 MG tablet Commonly known as:  PACERONE Take 100 mg by mouth daily.   AYR SALINE NASAL GEL NA Place into the nose. Apply topically to each nostril four times daily as needed for soreness and dryness   ezetimibe 10 MG tablet Commonly known as:  ZETIA Take 10 mg by mouth daily.   fluticasone 50 MCG/ACT nasal spray Commonly known as:  FLONASE Place 2 sprays into the nose 2 (two) times daily. 2 sprays in each nostril   hydrocortisone cream 1 % Apply topically as needed.   Levothyroxine Sodium 75 MCG Caps Take 1 mcg by mouth daily before breakfast.   loratadine 10 MG tablet Commonly known as:  CLARITIN Take 10 mg by mouth daily.   metroNIDAZOLE 1 % gel Commonly known as:  METROGEL Apply topically. Apply to rash on nose once daily   MIRALAX powder Generic drug:  polyethylene glycol powder Take 17 g by mouth daily. As directed   [place] DM 30-600 MG Tb12 Take by mouth. Take one tablet twice daily as needed for cough   potassium chloride SA 20 MEQ tablet Commonly known as:  K-DUR,KLOR-CON Take 40 mEq by mouth daily.   Rivaroxaban 15 MG Tabs tablet Commonly known as:  XARELTO Take 1 tablet (15 mg total) by mouth daily with supper.   sodium chloride 0.65 % Soln nasal spray Commonly known as:  OCEAN 1  spray. One spray into nostril every 2 hours as needed for nasal congestion   torsemide 20 MG tablet Commonly known as:  DEMADEX Take 60 mg by mouth daily.   triamcinolone cream 0.1 % Commonly known as:  KENALOG Apply 1 application topically as needed.   TYLENOL 325 MG tablet Generic drug:  acetaminophen Take 650 mg by mouth every 4 (four) hours as needed.       Review of Systems  Constitutional: Positive for appetite change. Negative for activity change, chills, diaphoresis, fatigue, fever and unexpected weight change.  HENT: Negative for congestion, sore throat, trouble  swallowing and voice change.   Respiratory: Negative for cough, shortness of breath and wheezing.   Cardiovascular: Negative for chest pain, palpitations and leg swelling.  Gastrointestinal: Positive for abdominal pain and nausea. Negative for abdominal distention, blood in stool, constipation, diarrhea, rectal pain and vomiting.  Genitourinary: Negative for difficulty urinating and dysuria.  Musculoskeletal: Positive for arthralgias. Negative for back pain, gait problem, joint swelling and myalgias.  Neurological: Positive for tremors. Negative for dizziness, seizures, syncope, facial asymmetry, speech difficulty, weakness, light-headedness, numbness and headaches.  Psychiatric/Behavioral: Negative for agitation, behavioral problems and confusion.    Immunization History  Administered Date(s) Administered  . Influenza Inj Mdck Quad Pf 07/21/2016  . Influenza Split 07/08/2011  . Influenza Whole 07/06/2009, 07/03/2012, 07/16/2013  . Influenza-Unspecified 07/08/2014, 07/16/2015  . PPD Test 11/02/2011  . Pneumococcal Conjugate-13 01/13/2015  . Pneumococcal Polysaccharide-23 10/27/2010   Pertinent  Health Maintenance Due  Topic Date Due  . INFLUENZA VACCINE  Completed  . DEXA SCAN  Completed  . PNA vac Low Risk Adult  Completed   Fall Risk  09/07/2016 06/01/2016 03/11/2015 03/03/2014  Falls in the past year? No No  No Yes  Number falls in past yr: - - - 1  Injury with Fall? - - - No   Functional Status Survey:    Vitals:   12/26/16 0950  BP: 122/78  Pulse: 98  Resp: 20  Temp: 97.5 F (36.4 C)  SpO2: 95%   There is no height or weight on file to calculate BMI. Physical Exam  Constitutional: She is oriented to person, place, and time. No distress.  HENT:  Head: Normocephalic and atraumatic.  Eyes: No scleral icterus.  Neck: No JVD present.  Cardiovascular: Normal rate and regular rhythm.   No murmur heard. No edema  Pulmonary/Chest: Effort normal. No respiratory distress. She has no wheezes.  Decreased breath sounds on the right base, hx of right hemidiaphragm  Abdominal: Soft. Bowel sounds are normal. She exhibits no distension and no mass. There is no tenderness. There is no rebound and no guarding.  Musculoskeletal: She exhibits no edema or tenderness.  Neurological: She is alert and oriented to person, place, and time.  Skin: Skin is warm and dry. She is not diaphoretic.  Psychiatric: She has a normal mood and affect.  Nursing note and vitals reviewed.   Labs reviewed:  Recent Labs  03/08/16 0344 09/06/16 0300  NA 145 145  K 4.2 4.1  BUN 22* 23*  CREATININE 1.5* 1.8*    Recent Labs  03/08/16 0344 11/11/16 1304  AST 10* 16  ALT 6* 11  ALKPHOS 92 103  BILITOT  --  0.4  PROT  --  7.2  ALBUMIN  --  4.1    Recent Labs  03/08/16 0344 09/06/16 0300  WBC 7.4 8.0  HGB 11.9* 12.0  HCT 35* 37  PLT 169 185   Lab Results  Component Value Date   TSH 2.330 11/11/2016   Lab Results  Component Value Date   HGBA1C 5.5 09/06/2016   Lab Results  Component Value Date   CHOL 233 (A) 09/06/2016   HDL 66 09/06/2016   LDLCALC 148 09/06/2016   TRIG 96 09/06/2016   CHOLHDL 2.2 09/08/2010    Significant Diagnostic Results in last 30 days:  No results found.  Assessment/Plan  1. Epigastric pain Typical symptoms of gallbladder/pancreas related issues Will order  ultrasound and labs since symptoms have been present for two weeks, does not appear acutely ill  12 lead ekg as well although her hx does not present as cardiac related  2. Decreased appetite Due to #1 Encourage fluids and low fat food   3. Gastroesophageal reflux disease without esophagitis Has a hx of this and is not currently on medication, if symptoms persist and ultrasound normal would consider adding PPI therapy    Family/ staff Communication: discussed with resident and staff  Labs/tests ordered:  BMP liver function panel, lipase, amylase, CBC, abd [place], 12 leak ekg

## 2016-12-27 DIAGNOSIS — R109 Unspecified abdominal pain: Secondary | ICD-10-CM | POA: Diagnosis not present

## 2017-01-11 ENCOUNTER — Encounter: Payer: Self-pay | Admitting: Internal Medicine

## 2017-01-11 ENCOUNTER — Non-Acute Institutional Stay: Payer: Medicare Other | Admitting: Internal Medicine

## 2017-01-11 VITALS — BP 130/60 | HR 82 | Temp 97.8°F | Wt 208.0 lb

## 2017-01-11 DIAGNOSIS — F339 Major depressive disorder, recurrent, unspecified: Secondary | ICD-10-CM | POA: Diagnosis not present

## 2017-01-11 MED ORDER — SERTRALINE HCL 25 MG PO TABS: 25.0000 mg | ORAL_TABLET | Freq: Every day | ORAL | 5 refills | Status: DC

## 2017-01-11 NOTE — Progress Notes (Signed)
Location:  [place] of Service:  Clinic (12)  Provider: Belen Zwahlen L. [name], D.O., C.M.D.  Code Status: DNR Goals of Care:  Advanced Directives 01/11/2017  Does Patient Have a Medical Advance Directive? Yes  Type of Advance Directive [place]  Does patient want to make changes to medical advance directive? -  Copy of [place] in Chart? Yes  Pre-existing out of facility DNR order (yellow form or pink MOST form) -   Chief Complaint  Patient presents with  . Acute Visit    depression, general malaise    HPI: Patient is a 83 y.o. female seen today for an acute visit for depression and generalized malaise.  Reports that her GI upset she had turned out to be constipation.  Gets knots in her sides that hurt when she needs to have a bm.  Is going to go to miralax every other day.  Had felt like her bra was too tight and it hurt in her back--she thought it was her gallbladder.  [place] was benign.  Took MOM and it resolved.    No significant reflux symptoms.  Has had depression before.  She says she has no reason to be depressed.  She lacks motivation to do anything, see anybody or talk to anyone.  She stayed in the hotel room instead of being with family when grandson got married.  Says she didn't sleep well in the bed of the hotel. Was on zoloft in 2014 for a few months.  Did not have any problems from it.    When seh had her first sleep study 12-13 years, they told her she had a little narcolepsy.  She had 4/5 narcolepsy items that were on a commercial she saw.    Does have her bad disc in her back that limits activity for long periods.  If she cuts or presses material, she has to sit.       Still having her olfactory hallucination of cigarette smoke.  She does have chronic sinusitis.  Reviewed labs with her.  Reports absence of appetite, but has not lost any weight.    Depression screen [facility] 2/9 01/11/2017 09/07/2016 03/11/2015  Decreased  Interest 2 0 0  Down, Depressed, Hopeless 1 0 0  PHQ - 2 Score 3 0 0  Altered sleeping 0 - -  Tired, decreased energy 2 - -  Change in appetite 2 - -  Feeling bad or failure about yourself  0 - -  Trouble concentrating 1 - -  Moving slowly or fidgety/restless 0 - -  Suicidal thoughts 0 - -  PHQ-9 Score 8 - -   Past Medical History:  Diagnosis Date  . Allergic rhinitis   . Anemia of renal disease 10/21/2011  . Anemia, iron deficiency 10/21/2011  . Cholelithiasis   . Chronic diastolic heart failure ([place])   . Colon polyp   . Constipation   . Cough    2nd to reflux  . Debility, unspecified   . Dehydration   . Depression   . Diaphragm dysfunction    Right hemidiaphragm  . Disorder of bone and cartilage, unspecified   . Diverticulosis of colon   . Dysphagia   . GERD (gastroesophageal reflux disease) 02/08/2013  . History of hyperkalemia in setting of spironolactone 09/2010  . Hyperlipidemia   . Hypertension   . Hypopotassemia   . Hypothyroidism 02/08/2013  . Long term (current) use of anticoagulants   . MDS (myelodysplastic  syndrome), low grade ([place]) 10/21/2011  . Obesity   . Obesity hypoventilation syndrome ([place])   . OSA (obstructive sleep apnea)   . Osteoarthrosis, unspecified whether generalized or localized, unspecified site   . Other specified disease of white blood cells   . Pacemaker    Implanted December 2012  . Paroxysmal a-fib (HCC) with RVR   Amiodarone  . Reflux esophagitis   . Rosacea 05/15/2013  . Senile cataract   . Sinus node dysfunction (HCC)   . Tracheobronchomalacia   . Unspecified vitamin D deficiency   . Urinary incontinence   . Xerostomia     Past Surgical History:  Procedure Laterality Date  . CATARACT EXTRACTION  2008  . PACEMAKER PLACEMENT  10/21/2010   STJ, dural chamber Dr. [name]  . TOTAL KNEE ARTHROPLASTY  1999   right knee  . TOTAL KNEE ARTHROPLASTY  2003   left knee  . TUBAL LIGATION  1964  . Tummy tuck  2000   pt "almost died";  respiratory distress, became delrious    Allergies  Allergen Reactions  . Codeine     Extreme lethargy  . Iron     By infusion  . Morphine   . Toviaz [Fesoterodine Fumarate]     Allergies as of 01/11/2017      Reactions   Codeine    Extreme lethargy   Iron    By infusion   Morphine    Toviaz [fesoterodine Fumarate]       Medication List       Accurate as of 01/11/17  4:34 PM. Always use your most recent med list.          allopurinol 100 MG tablet Commonly known as:  ZYLOPRIM Take 100 mg by mouth daily.   amiodarone 200 MG tablet Commonly known as:  PACERONE Take 100 mg by mouth daily.   AYR SALINE NASAL GEL NA Place into the nose. Apply topically to each nostril four times daily as needed for soreness and dryness   ezetimibe 10 MG tablet Commonly known as:  ZETIA Take 10 mg by mouth daily.   fluticasone 50 MCG/ACT nasal spray Commonly known as:  FLONASE Place 2 sprays into the nose 2 (two) times daily. 2 sprays in each nostril   hydrocortisone cream 1 % Apply topically as needed.   Levothyroxine Sodium 75 MCG Caps Take 1 mcg by mouth daily before breakfast.   loratadine 10 MG tablet Commonly known as:  CLARITIN Take 10 mg by mouth daily.   metroNIDAZOLE 1 % gel Commonly known as:  METROGEL Apply topically. Apply to rash on nose once daily   MIRALAX powder Generic drug:  polyethylene glycol powder Take 17 g by mouth daily. As directed   [place] DM 30-600 MG Tb12 Take by mouth. Take one tablet twice daily as needed for cough   oseltamivir 30 MG capsule Commonly known as:  TAMIFLU Take 30 mg by mouth daily.   potassium chloride SA 20 MEQ tablet Commonly known as:  K-DUR,KLOR-CON Take 40 mEq by mouth daily.   pseudoephedrine-guaifenesin 60-600 MG 12 hr tablet Commonly known as:  MUCINEX D Take 1 tablet by mouth every 12 (twelve) hours.   Rivaroxaban 15 MG Tabs tablet Commonly known as:  XARELTO Take 1 tablet (15 mg total) by mouth daily  with supper.   sodium chloride 0.65 % Soln nasal spray Commonly known as:  OCEAN 1 spray. One spray into nostril every 2 hours as needed for nasal congestion  torsemide 20 MG tablet Commonly known as:  DEMADEX Take 60 mg by mouth daily.   triamcinolone cream 0.1 % Commonly known as:  KENALOG Apply 1 application topically as needed.   TYLENOL 325 MG tablet Generic drug:  acetaminophen Take 650 mg by mouth every 4 (four) hours as needed.       Review of Systems:  ROS  Health Maintenance  Topic Date Due  . TETANUS/TDAP  12/30/1950  . INFLUENZA VACCINE  05/03/2017  . DEXA SCAN  Completed  . PNA vac Low Risk Adult  Completed    Physical Exam: Vitals:   01/11/17 1611  BP: (!) 160/70  Pulse: 82  Temp: 97.8 F (36.6 C)  TempSrc: Oral  SpO2: 93%  Weight: 208 lb (94.3 kg)   Body mass index is 35.7 kg/m. Physical Exam  Labs reviewed: Basic Metabolic Panel:  Recent Labs  03/08/16 0344 09/06/16 0300 11/11/16 1304 12/26/16 0200  NA 145 145  --  144  K 4.2 4.1  --  4.1  BUN 22* 23*  --  20  CREATININE 1.5* 1.8*  --  1.6*  TSH 3.25  --  2.330  --    Liver Function Tests:  Recent Labs  03/08/16 0344 11/11/16 1304 12/26/16 0200  AST 10* 16 13  ALT 6* 11 7  ALKPHOS 92 103 89  BILITOT  --  0.4  --   PROT  --  7.2  --   ALBUMIN  --  4.1  --    No results for input(s): LIPASE, AMYLASE in the last 8760 hours. No results for input(s): AMMONIA in the last 8760 hours. CBC:  Recent Labs  03/08/16 0344 09/06/16 0300 12/26/16 0200  WBC 7.4 8.0 8.3  HGB 11.9* 12.0 11.7*  HCT 35* 37 35*  PLT 169 185 209   Lipid Panel:  Recent Labs  03/08/16 0344 09/06/16 0300  CHOL 214* 233*  HDL 73* 66  LDLCALC 123 148  TRIG 88 96   Lab Results  Component Value Date   HGBA1C 5.5 09/06/2016    Assessment/Plan 1. Depression, recurrent ([place]) -start low dose zoloft 25mg  po daily to see if this helps -does not desire any counseling at this time  Labs/tests  ordered:   Orders Placed This Encounter  Procedures  . Basic metabolic panel    This external order was created through the Results Console.  . Hepatic function panel    This external order was created through the Results Console.  . CBC and differential    This external order was created through the Results Console.    Next appt:  03/08/2017  Carlis Burnsworth L. Simi Briel, D.O. [facility] [address], [place] [address] Cell Phone (Mon-Fri 8am-5pm):  [phone] On Call:  [phone] & follow prompts after 5pm & weekends Office Phone:  [phone] Office Fax:  [phone]

## 2017-02-02 ENCOUNTER — Ambulatory Visit: Payer: Medicare Other | Admitting: Pulmonary Disease

## 2017-02-02 DIAGNOSIS — R109 Unspecified abdominal pain: Secondary | ICD-10-CM | POA: Diagnosis not present

## 2017-02-02 DIAGNOSIS — R319 Hematuria, unspecified: Secondary | ICD-10-CM | POA: Diagnosis not present

## 2017-02-02 DIAGNOSIS — N390 Urinary tract infection, site not specified: Secondary | ICD-10-CM | POA: Diagnosis not present

## 2017-02-06 ENCOUNTER — Encounter: Payer: Self-pay | Admitting: Pulmonary Disease

## 2017-02-06 ENCOUNTER — Ambulatory Visit (INDEPENDENT_AMBULATORY_CARE_PROVIDER_SITE_OTHER): Payer: Medicare Other | Admitting: Pulmonary Disease

## 2017-02-06 VITALS — BP 148/86 | HR 71 | Ht 64.0 in | Wt 202.8 lb

## 2017-02-06 DIAGNOSIS — J398 Other specified diseases of upper respiratory tract: Secondary | ICD-10-CM

## 2017-02-06 DIAGNOSIS — J9611 Chronic respiratory failure with hypoxia: Secondary | ICD-10-CM | POA: Diagnosis not present

## 2017-02-06 DIAGNOSIS — G4730 Sleep apnea, unspecified: Secondary | ICD-10-CM | POA: Diagnosis not present

## 2017-02-06 NOTE — Patient Instructions (Signed)
Will arrange for overnight oxygen test on Bipap 14/10 cm H2O  Will get copy of Bipap report  Can look up mask options at [email or web address]  Follow up in 1 year

## 2017-02-06 NOTE — Progress Notes (Signed)
Current Outpatient Prescriptions on File Prior to Visit  Medication Sig  . acetaminophen (TYLENOL) 325 MG tablet Take 650 mg by mouth every 4 (four) hours as needed.   Marland Kitchen allopurinol (ZYLOPRIM) 100 MG tablet Take 100 mg by mouth daily.  . Aloe-Sodium Chloride (AYR SALINE NASAL GEL NA) Place into the nose. Apply topically to each nostril four times daily as needed for soreness and dryness  . amiodarone (PACERONE) 200 MG tablet Take 100 mg by mouth daily.   Marland Kitchen ezetimibe (ZETIA) 10 MG tablet Take 10 mg by mouth daily.  . fluticasone (FLONASE) 50 MCG/ACT nasal spray Place 2 sprays into the nose 2 (two) times daily. 2 sprays in each nostril  . hydrocortisone cream 1 % Apply topically as needed.  . Levothyroxine Sodium 75 MCG CAPS Take 1 mcg by mouth daily before breakfast.  . loratadine (CLARITIN) 10 MG tablet Take 10 mg by mouth daily.  . metroNIDAZOLE (METROGEL) 1 % gel Apply topically. Apply to rash on nose once daily  . polyethylene glycol powder (MIRALAX) powder Take 17 g by mouth daily. As directed  . potassium chloride SA (K-DUR,KLOR-CON) 20 MEQ tablet Take 40 mEq by mouth daily.  . pseudoephedrine-guaifenesin (MUCINEX D) 60-600 MG 12 hr tablet Take 1 tablet by mouth every 12 (twelve) hours.  . Rivaroxaban (XARELTO) 15 MG TABS tablet Take 1 tablet (15 mg total) by mouth daily with supper.  . sertraline (ZOLOFT) 25 MG tablet Take 1 tablet (25 mg total) by mouth daily.  . sodium chloride (OCEAN) 0.65 % SOLN nasal spray 1 spray. One spray into nostril every 2 hours as needed for nasal congestion  . torsemide (DEMADEX) 20 MG tablet Take 60 mg by mouth daily.   Marland Kitchen triamcinolone cream (KENALOG) 0.1 % Apply 1 application topically as needed.   No current facility-administered medications on file prior to visit.     Chief Complaint  Patient presents with  . Follow-up    Pt using BiPAP nightly. Denies problems with mask/pressure. Pt notes little leaking at times. DME:  [facility] -- Pt did not bring CPAP  card for D/L    Pulmonary tests PFT's March 29 2010 >> FEV1 1.34 (72) with ratio 75 and ERV 42, DLC0 55 > corrects to 137 CT chest 10/17/10 >> GGO LUL, no PE, tracheobronchomalacia involving the visualized trachea with areas of air trapping seen throughout both lungs  Sleep tests PSG 12/14/10 >> AHI 17.9, REM 40.8 BPAP titraton 01/12/11 >> 18/13 cm H2O with 3 liters oxygen. ONO on BiPAP 03/16/15  >> Test time 8 hrs 2 min. Mean SpO2 90.3%, low SpO2 70%. Spent 33 min with SpO2 < 88%. BiPAP 11/05/15 to 02/02/16 >> used on 90 of 90 nights with average 7 hrs 54 min.  Average AHI 8.5 with BiPAP 14/10 cm H2O  Past medical history PAF, s/p PM, HTN, Diastolic CHF, CKD, MDS, GERD, Hypothyroidism, Depression  Past surgical history, Family history, Social history, Allergies reviewed  Vital signs BP (!) 148/86 (BP Location: Left Arm, Cuff Size: Normal)   Pulse 71   Ht 5\' 4"  (1.626 m)   Wt 202 lb 12.8 oz (92 kg)   SpO2 97%   BMI 34.81 kg/m   History of Present Illness: Cindy Cervantes is a 83 y.o. female former smoker with chronic hypoxic respiratory failure 2nd to OSA, tracheobronchomalacia, and Rt hemidiaphragm elevation.  She is going to be seeing GI to assess constipation and abdominal pain.  Her appetite hasn't been good, and she  only eats two small meals a day.  She has lost about 15 lbs since last year.  She is not having cough, chest congestion, chest pain, or leg swelling.  She doesn't think she can sleep w/o Bipap.  She is not sure if supplemental oxygen is helping.   Physical Exam:  General - pleasant Eyes - pupils reactive ENT - no sinus tenderness, no oral exudate, no LAN Cardiac - regular, no murmur Chest - no wheeze, rales Abd - soft, non tender Ext - no edema Skin - no rashes Neuro - normal strength Psych - normal mood   Assessment/Plan:  Chronic hypoxic respiratory failure secondary to obstructive sleep apnea, tracheobronchomalacia, and Rt hemidiaphragm  paralysis. - continue Bipap 14/10 cm H2O - will arrange for ONO on Bipap >> depending on results will determine if she needs to continue using 2 liters oxygen with Bipap   Patient Instructions  Will arrange for overnight oxygen test on Bipap 14/10 cm H2O  Will get copy of Bipap report  Can look up mask options at [email or web address]  Follow up in 1 year    Chesley Mires, MD [facility] Pager:  [phone] 02/06/2017, 5:02 PM

## 2017-02-08 ENCOUNTER — Telehealth: Payer: Self-pay | Admitting: Pulmonary Disease

## 2017-02-08 NOTE — Telephone Encounter (Signed)
PCCs  Pt called and stated she spoke with [facility] and they told her, her Cindy Cervantes has to go through the company she has her POC through. Pt states the order needs to go to [facility]

## 2017-02-09 ENCOUNTER — Ambulatory Visit (INDEPENDENT_AMBULATORY_CARE_PROVIDER_SITE_OTHER): Payer: Medicare Other | Admitting: *Deleted

## 2017-02-09 ENCOUNTER — Telehealth: Payer: Self-pay | Admitting: Cardiology

## 2017-02-09 DIAGNOSIS — I495 Sick sinus syndrome: Secondary | ICD-10-CM

## 2017-02-09 NOTE — Telephone Encounter (Signed)
Spoke with pt and reminded pt of remote transmission that is due today. Pt verbalized understanding.   

## 2017-02-09 NOTE — Telephone Encounter (Signed)
[facility] note updated. Nothing further is needed

## 2017-02-09 NOTE — Telephone Encounter (Signed)
Cc's will take care of this change for ono however triage needs to update dme in pcc note in pt's chart thanks libby

## 2017-02-09 NOTE — Progress Notes (Signed)
Remote pacemaker transmission.   

## 2017-02-10 LAB — CUP PACEART REMOTE DEVICE CHECK
Battery Remaining Longevity: 91 mo
Battery Remaining Percentage: 73 %
Battery Voltage: 2.92 V
Brady Statistic AP VP Percent: 1 %
Brady Statistic AP VS Percent: 54 %
Brady Statistic AS VP Percent: 1 %
Brady Statistic AS VS Percent: 44 %
Brady Statistic RA Percent Paced: 52 %
Brady Statistic RV Percent Paced: 1 %
Date Time Interrogation Session: 20180510135712
Implantable Lead Implant Date: 20120119
Implantable Lead Implant Date: 20120119
Implantable Lead Location: 753859
Implantable Lead Location: 753860
Implantable Lead Model: 1948
Implantable Pulse Generator Implant Date: 20120119
Lead Channel Impedance Value: 410 Ohm
Lead Channel Impedance Value: 750 Ohm
Lead Channel Pacing Threshold Amplitude: 0.5 V
Lead Channel Pacing Threshold Amplitude: 0.75 V
Lead Channel Pacing Threshold Pulse Width: 0.5 ms
Lead Channel Pacing Threshold Pulse Width: 0.5 ms
Lead Channel Sensing Intrinsic Amplitude: 1.5 mV
Lead Channel Sensing Intrinsic Amplitude: 9.5 mV
Lead Channel Setting Pacing Amplitude: 1.5 V
Lead Channel Setting Pacing Amplitude: 2.5 V
Lead Channel Setting Pacing Pulse Width: 0.5 ms
Lead Channel Setting Sensing Sensitivity: 2 mV
Pulse Gen Model: 2210
Pulse Gen Serial Number: 7198677

## 2017-02-13 ENCOUNTER — Encounter: Payer: Self-pay | Admitting: Pulmonary Disease

## 2017-02-24 ENCOUNTER — Telehealth: Payer: Self-pay | Admitting: Pulmonary Disease

## 2017-02-24 NOTE — Telephone Encounter (Signed)
Pt is requesting ONO results.  I have spoken with [place] at [facility], who states results are not back yet, due to pulse Ox being broken. [place] states they are now having to go through another company, and will be going out to meet with pt on Tuesday to have her fill out new paperwork. I have spoken with pt to make her aware of this. pt states she has not been made aware of this. Pt states she will contact family [facility]. Nothing further needed at this time.

## 2017-02-28 DIAGNOSIS — K5730 Diverticulosis of large intestine without perforation or abscess without bleeding: Secondary | ICD-10-CM | POA: Diagnosis not present

## 2017-02-28 DIAGNOSIS — K5900 Constipation, unspecified: Secondary | ICD-10-CM | POA: Diagnosis not present

## 2017-03-01 DIAGNOSIS — R0902 Hypoxemia: Secondary | ICD-10-CM | POA: Diagnosis not present

## 2017-03-01 DIAGNOSIS — J449 Chronic obstructive pulmonary disease, unspecified: Secondary | ICD-10-CM | POA: Diagnosis not present

## 2017-03-06 DIAGNOSIS — E785 Hyperlipidemia, unspecified: Secondary | ICD-10-CM | POA: Diagnosis not present

## 2017-03-06 DIAGNOSIS — D509 Iron deficiency anemia, unspecified: Secondary | ICD-10-CM | POA: Diagnosis not present

## 2017-03-06 DIAGNOSIS — E784 Other hyperlipidemia: Secondary | ICD-10-CM | POA: Diagnosis not present

## 2017-03-06 DIAGNOSIS — I480 Paroxysmal atrial fibrillation: Secondary | ICD-10-CM | POA: Diagnosis not present

## 2017-03-06 LAB — LIPID PANEL
Cholesterol: 201 mg/dL — AB (ref 0–200)
HDL: 57 mg/dL (ref 35–70)
LDL Cholesterol: 129 mg/dL
Triglycerides: 73 mg/dL (ref 40–160)

## 2017-03-06 LAB — HEPATIC FUNCTION PANEL
ALT: 8 U/L (ref 7–35)
AST: 13 U/L (ref 13–35)
Alkaline Phosphatase: 97 U/L (ref 25–125)
Bilirubin, Total: 0.4 mg/dL

## 2017-03-06 LAB — BASIC METABOLIC PANEL
BUN: 17 mg/dL (ref 4–21)
Creatinine: 1.9 mg/dL — AB (ref 0.5–1.1)
Glucose: 86 mg/dL
Potassium: 3.5 mmol/L (ref 3.4–5.3)
Sodium: 142 mmol/L (ref 137–147)

## 2017-03-08 ENCOUNTER — Telehealth: Payer: Self-pay | Admitting: Pulmonary Disease

## 2017-03-08 ENCOUNTER — Non-Acute Institutional Stay: Payer: Medicare Other | Admitting: Internal Medicine

## 2017-03-08 ENCOUNTER — Encounter: Payer: Self-pay | Admitting: Internal Medicine

## 2017-03-08 VITALS — BP 148/80 | HR 84 | Temp 98.0°F | Ht 64.0 in | Wt 206.0 lb

## 2017-03-08 DIAGNOSIS — Z7189 Other specified counseling: Secondary | ICD-10-CM | POA: Diagnosis not present

## 2017-03-08 DIAGNOSIS — Z0000 Encounter for general adult medical examination without abnormal findings: Secondary | ICD-10-CM

## 2017-03-08 DIAGNOSIS — K5904 Chronic idiopathic constipation: Secondary | ICD-10-CM | POA: Diagnosis not present

## 2017-03-08 NOTE — Telephone Encounter (Signed)
Bipap 01/23/17 to 02/21/17 >> used on 30 of 30 nights with average 7 hrs 59 min.  Average AHI 4.8 with Bipap 14/10 cm H2O.   Please tell pt that Bipap reports shows good control of sleep apnea, but she needs to use oxygen at night with Bipap still based on ONO results.

## 2017-03-08 NOTE — Progress Notes (Signed)
Location:  Occupational psychologist of Service:  Clinic (12) Provider: Peggy Loge L. [name], D.O., C.M.D.  Patient Care Team: Gayland Curry, DO as PCP - General (Geriatric Medicine) Mardene Celeste, NP as Nurse Practitioner (Geriatric Medicine) [facility] Nance Pear, MD as Consulting Physician (Pulmonary Disease) Larey Dresser, MD as Consulting Physician (Cardiology) Lafayette Dragon, MD (Inactive) as Consulting Physician (Gastroenterology) Volanda Napoleon, MD as Consulting Physician (Oncology) Ninetta Lights, MD as Consulting Physician (Orthopedic Surgery) Sydnee Levans, MD as Consulting Physician (Dermatology) Prentiss Bells, MD as Consulting Physician (Ophthalmology)  Extended Emergency Contact Information Primary Emergency Contact: Gooding,Leslie Address: [place], [place] Phone: [phone] Relation: Daughter  Code Status: DNR Says if she loses her brain function, she'd like someone to give her a shot and put her to sleep like a dog.  She does not want to be kept on machines to keep her alive.  Goals of Care: Advanced Directive information Advanced Directives 03/08/2017  Does Patient Have a Medical Advance Directive? Yes  Type of Advance Directive [place]  Does patient want to make changes to medical advance directive? -  Copy of Jette in Chart? Yes  Pre-existing out of facility DNR order (yellow form or pink MOST form) -   Chief Complaint  Patient presents with  . Annual Exam    wellness exam  . MMSE    [facility] 30/30 passed clock    HPI: Patient is a 83 y.o. female seen in today for an annual wellness exam.    Last week, she was constipated.  Went to gi and told she had a loopy bowel--continue laxatives.  Felt like tennis ball in llq.  Took miralax three days last week and some soft stool each time now.   Talked about dried fruit and nuts helping and prunes, prune juice.  Depression screen [facility] 2/9 03/08/2017 01/11/2017 09/07/2016 03/11/2015 03/03/2014  Decreased Interest 0 2 0 0 0  Down, Depressed, Hopeless 0 1 0 0 0  PHQ - 2 Score 0 3 0 0 0  Altered sleeping - 0 - - -  Tired, decreased energy - 2 - - -  Change in appetite - 2 - - -  Feeling bad or failure about yourself  - 0 - - -  Trouble concentrating - 1 - - -  Moving slowly or fidgety/restless - 0 - - -  Suicidal thoughts - 0 - - -  PHQ-9 Score - 8 - - -    Fall Risk  03/08/2017 01/11/2017 09/07/2016 06/01/2016 03/11/2015  Falls in the past year? No No No No No  Number falls in past yr: - - - - -  Injury with Fall? - - - - -   MMSE - [place] Exam 11/02/2016 10/29/2015  Orientation to time 5 5  Orientation to Place 5 5  Registration 3 3  Attention/ Calculation 5 5  Recall 3 3  Language- name 2 objects 2 2  Language- repeat 1 1  Language- follow 3 step command 3 3  Language- read & follow direction 1 1  Write a sentence 1 1  Copy design 1 1  Total score 30 30  passed clock  Health Maintenance  Topic Date Due  . TETANUS/TDAP  12/30/1950  . INFLUENZA VACCINE  05/03/2017  . DEXA SCAN  Completed  . PNA vac Low Risk Adult  Completed    Functional Status Survey: Is the patient deaf or have difficulty hearing?: No Does the patient have difficulty seeing, even when wearing glasses/contacts?: No Does the patient have difficulty concentrating, remembering, or making decisions?: No Does the patient have difficulty walking or climbing stairs?:  (rollator for balance, power chair for long distances) Does the patient have difficulty dressing or bathing?: No Does the patient have difficulty doing errands alone such as visiting a doctor's office or shopping?: No (daughter takes her out for a couple of places, then tired) Current Exercise Habits: The patient does not participate in regular exercise at present Exercise limited by:  orthopedic condition(s) Diet? regular No exam data present Hearing:no problem Dentition:no issues Pain:  None except if constipated.  Past Medical History:  Diagnosis Date  . Allergic rhinitis   . Anemia of renal disease 10/21/2011  . Anemia, iron deficiency 10/21/2011  . Cholelithiasis   . Chronic diastolic heart failure ([place])   . Colon polyp   . Constipation   . Cough    2nd to reflux  . Debility, unspecified   . Dehydration   . Depression   . Diaphragm dysfunction    Right hemidiaphragm  . Disorder of bone and cartilage, unspecified   . Diverticulosis of colon   . Dysphagia   . GERD (gastroesophageal reflux disease) 02/08/2013  . History of hyperkalemia in setting of spironolactone 09/2010  . Hyperlipidemia   . Hypertension   . Hypopotassemia   . Hypothyroidism 02/08/2013  . Long term (current) use of anticoagulants   . MDS (myelodysplastic syndrome), low grade ([place]) 10/21/2011  . Obesity   . Obesity hypoventilation syndrome ([place])   . OSA (obstructive sleep apnea)   . Osteoarthrosis, unspecified whether generalized or localized, unspecified site   . Other specified disease of white blood cells   . Pacemaker    Implanted December 2012  . Paroxysmal A-fib (HCC) with RVR   Amiodarone  . Reflux esophagitis   . Rosacea 05/15/2013  . Senile cataract   . Sinus node dysfunction (HCC)   . Tracheobronchomalacia   . Unspecified vitamin D deficiency   . Urinary incontinence   . Xerostomia     Past Surgical History:  Procedure Laterality Date  . CATARACT EXTRACTION  2008  . PACEMAKER PLACEMENT  10/21/2010   STJ, dural chamber Dr. [name]  . TOTAL KNEE ARTHROPLASTY  1999   right knee  . TOTAL KNEE ARTHROPLASTY  2003   left knee  . TUBAL LIGATION  1964  . Tummy tuck  2000   pt "almost died"; respiratory distress, became delrious    Family History  Problem Relation Age of Onset  . Heart disease Father   . Heart disease Brother     Social History   Social History  .  Marital status: Widowed    Spouse name: N/A  . Number of children: N/A  . Years of education: N/A   Occupational History  . retired Retired   Social History Main Topics  . Smoking status: Former Smoker    Packs/day: 0.50    Types: Cigarettes    Quit date: 10/03/1982  . Smokeless tobacco: Never Used  . Alcohol use 0.6 oz/week    1 Glasses of wine per week     Comment: 1 glass of wine every Monday & Thursday evening  . Drug use: No  . Sexual activity: No   Other Topics Concern  . None  Social History Narrative   Widowed 2002 (married 1955), originally form [place]. 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 Moved from [place], [place] to [place], [place] section 2009. Moved to [facility] section 2012.    Non smoker, mininmal alcohol.    Has POA   Power wheelchair, walker   Exercise none                   reports that she quit smoking about 34 years ago. Her smoking use included Cigarettes. She smoked 0.50 packs per day. She has never used smokeless tobacco. She reports that she drinks about 0.6 oz of alcohol per week . She reports that she does not use drugs.  Allergies as of 03/08/2017      Reactions   Codeine    Extreme lethargy   Iron    By infusion   Morphine    Toviaz [fesoterodine Fumarate]       Medication List       Accurate as of 03/08/17 10:54 AM. Always use your most recent med list.          allopurinol 100 MG tablet Commonly known as:  ZYLOPRIM Take 100 mg by mouth daily.   amiodarone 200 MG tablet Commonly known as:  PACERONE Take 100 mg by mouth daily.   docusate sodium 100 MG capsule Commonly known as:  COLACE Take 100 mg by mouth daily as needed for mild constipation.   ezetimibe 10 MG tablet Commonly known as:  ZETIA Take 10 mg by mouth daily.   fluticasone 50 MCG/ACT nasal spray Commonly known as:  FLONASE Place 2 sprays into the nose 2 (two) times daily. 2 sprays  in each nostril   Levothyroxine Sodium 75 MCG Caps Take 1 mcg by mouth daily before breakfast.   loratadine 10 MG tablet Commonly known as:  CLARITIN Take 10 mg by mouth daily.   MIRALAX powder Generic drug:  polyethylene glycol powder Take 17 g by mouth daily. As directed   potassium chloride SA 20 MEQ tablet Commonly known as:  K-DUR,KLOR-CON Take 40 mEq by mouth daily.   pseudoephedrine-guaifenesin 60-600 MG 12 hr tablet Commonly known as:  MUCINEX D Take 1 tablet by mouth every 12 (twelve) hours.   Rivaroxaban 15 MG Tabs tablet Commonly known as:  XARELTO Take 1 tablet (15 mg total) by mouth daily with supper.   sertraline 25 MG tablet Commonly known as:  ZOLOFT Take 1 tablet (25 mg total) by mouth daily.   torsemide 20 MG tablet Commonly known as:  DEMADEX Take 60 mg by mouth daily.   TYLENOL 325 MG tablet Generic drug:  acetaminophen Take 650 mg by mouth every 4 (four) hours as needed.       Review of Systems:  ROS  Physical Exam: Vitals:   03/08/17 1005  BP: (!) 148/80  Pulse: 84  Temp: 98 F (36.7 C)  TempSrc: Oral  SpO2: 93%  Weight: 206 lb (93.4 kg)  Height: 5\' 4"  (1.626 m)   Body mass index is 35.36 kg/m. Physical Exam  Labs reviewed: Basic Metabolic Panel:  Recent Labs  09/06/16 0300 11/11/16 1304 12/26/16 0200 03/06/17 0600  NA 145  --  144 142  K 4.1  --  4.1 3.5  BUN 23*  --  20 17  CREATININE 1.8*  --  1.6* 1.9*  TSH  --  2.330  --   --    Liver Function Tests:  Recent Labs  11/11/16 1304 12/26/16 0200 03/06/17 0600  AST 16 13 13   ALT 11 7 8   ALKPHOS 103 89 97  BILITOT 0.4  --   --   PROT 7.2  --   --   ALBUMIN 4.1  --   --    No results for input(s): LIPASE, AMYLASE in the last 8760 hours. No results for input(s): AMMONIA in the last 8760 hours. CBC:  Recent Labs  09/06/16 0300 12/26/16 0200  WBC 8.0 8.3  HGB 12.0 11.7*  HCT 37 35*  PLT 185 209   Lipid Panel:  Recent Labs  09/06/16 0300  03/06/17 0600  CHOL 233* 201*  HDL 66 57  LDLCALC 148 129  TRIG 96 73   Lab Results  Component Value Date   HGBA1C 5.5 09/06/2016    Assessment/Plan 1. Medicare annual wellness visit, subsequent -performed today as above -will need shingrix, tdap, should have bone density, refuses intervention for her elevated LDL  2. Chronic idiopathic constipation -cont current bowel regimen with miralax, try to eat more dried fruit, prunes, prune juice  3. Advance care planning -discussed code status today--she does not want to be resuscitated or kept alive on machines -gold DNR form done today--will be copied and scanned in documents/media and original placed in [facility] chart  Labs/tests ordered:  Cbc with diff, cmp before Next appt:  6 mos med mgt, labs before  [place] L. Saharsh Sterling, D.O. [facility] [address],  [address] Cell Phone (Mon-Fri 8am-5pm):  [phone] On Call:  [phone] & follow prompts after 5pm & weekends Office Phone:  [phone] Office Fax:  [phone]

## 2017-03-08 NOTE — Telephone Encounter (Signed)
ONO with Bipap 02/13/17 >> test time 7 hrs 5 min.  Average SpO2 90%, low SpO2 80%.  Spent 1 hr 41 min with SpO2 < 88%   Will have my nurse inform pt that ONO showed oxygen still gets low with only using Bipap.  She needs to continue using supplemental oxygen with Bipap at night.

## 2017-03-09 NOTE — Telephone Encounter (Signed)
Results have been explained to patient, pt expressed understanding. Pt states that she has not been using the O2 with the BiPAP machine - pt advised of results of ONO and the importance of following recommendations and starting to use this again with her machine. Nothing further needed.

## 2017-05-10 DIAGNOSIS — Z85828 Personal history of other malignant neoplasm of skin: Secondary | ICD-10-CM | POA: Diagnosis not present

## 2017-05-10 DIAGNOSIS — D225 Melanocytic nevi of trunk: Secondary | ICD-10-CM | POA: Diagnosis not present

## 2017-05-10 DIAGNOSIS — L821 Other seborrheic keratosis: Secondary | ICD-10-CM | POA: Diagnosis not present

## 2017-05-10 DIAGNOSIS — D1801 Hemangioma of skin and subcutaneous tissue: Secondary | ICD-10-CM | POA: Diagnosis not present

## 2017-05-10 DIAGNOSIS — L918 Other hypertrophic disorders of the skin: Secondary | ICD-10-CM | POA: Diagnosis not present

## 2017-05-10 DIAGNOSIS — L820 Inflamed seborrheic keratosis: Secondary | ICD-10-CM | POA: Diagnosis not present

## 2017-05-10 DIAGNOSIS — L281 Prurigo nodularis: Secondary | ICD-10-CM | POA: Diagnosis not present

## 2017-05-10 DIAGNOSIS — B078 Other viral warts: Secondary | ICD-10-CM | POA: Diagnosis not present

## 2017-05-11 ENCOUNTER — Ambulatory Visit (INDEPENDENT_AMBULATORY_CARE_PROVIDER_SITE_OTHER): Payer: Medicare Other | Admitting: *Deleted

## 2017-05-11 DIAGNOSIS — I495 Sick sinus syndrome: Secondary | ICD-10-CM | POA: Diagnosis not present

## 2017-05-11 NOTE — Progress Notes (Signed)
Remote pacemaker transmission.   

## 2017-05-26 ENCOUNTER — Encounter: Payer: Self-pay | Admitting: Cardiology

## 2017-06-02 LAB — CUP PACEART REMOTE DEVICE CHECK
Battery Voltage: 2.89 V
Brady Statistic AP VP Percent: 1 %
Brady Statistic AP VS Percent: 59 %
Brady Statistic AS VP Percent: 1 %
Brady Statistic AS VS Percent: 40 %
Date Time Interrogation Session: 20180809060009
Implantable Lead Implant Date: 20120119
Implantable Lead Location: 753860
Implantable Pulse Generator Implant Date: 20120119
Lead Channel Impedance Value: 840 Ohm
Lead Channel Pacing Threshold Amplitude: 0.75 V
Lead Channel Pacing Threshold Pulse Width: 0.5 ms
Lead Channel Sensing Intrinsic Amplitude: 11.3 mV
Lead Channel Setting Pacing Amplitude: 1.5 V
Lead Channel Setting Pacing Amplitude: 2.5 V
Lead Channel Setting Pacing Pulse Width: 0.5 ms
Lead Channel Setting Sensing Sensitivity: 2 mV
MDC IDC LEAD IMPLANT DT: 20120119
MDC IDC LEAD LOCATION: 753859
MDC IDC MSMT BATTERY REMAINING LONGEVITY: 72 mo
MDC IDC MSMT BATTERY REMAINING PERCENTAGE: 57 %
MDC IDC MSMT LEADCHNL RA IMPEDANCE VALUE: 440 Ohm
MDC IDC MSMT LEADCHNL RA PACING THRESHOLD AMPLITUDE: 0.5 V
MDC IDC MSMT LEADCHNL RA PACING THRESHOLD PULSEWIDTH: 0.5 ms
MDC IDC MSMT LEADCHNL RA SENSING INTR AMPL: 1.4 mV
MDC IDC STAT BRADY RA PERCENT PACED: 58 %
MDC IDC STAT BRADY RV PERCENT PACED: 1 %
Pulse Gen Model: 2210
Pulse Gen Serial Number: 7198677

## 2017-06-04 NOTE — Progress Notes (Signed)
Cardiology Office Note Date:  06/04/2017  Patient ID:  Cindy, Cervantes [birth year], MRN [id number] PCP:  Gayland Curry, DO  Electrophysiology: Dr. [name] [facility] Chesley Mires, MD as Consulting Physician (Pulmonary Disease) Volanda Napoleon, MD as Consulting Physician (Oncology) Trixie Rude., MD as Consulting Physician (Ophthalmology)   Chief Complaint:  Routine 6 mo EP, pacer visit  History of Present Illness: Cindy Cervantes is a 83 y.o. female with history of CHF, OSA on bipap, HTN, afib, tracheobronchomalacia, CKD, anemia, pacemaker with [employer], HLD, MDS, and hypothyroidism   She comes today, seen for Dr. [name], last seen by him in Feb this year doing well.  Saw geriatric medicine June, also appeared to be doing well by their note.  She continues to be doing quite well, but doen't do much in the way of physical activity, does quilting/arts to keep busy.  She says because she is "lazy".  She c/w pulmonary and no longer wearing O2 at all, is compliant with her BIPAP at night, she denies any kind of CP, palpitations, no dizziness, near syncope or syncope.  No bleeding or signs of bleeding reported.   DEVICE information:  STJ PPM, implanted 10/21/10, Dr. [name], tachy-brady  Past Medical History:  Diagnosis Date  . Allergic rhinitis   . Anemia of renal disease 10/21/2011  . Anemia, iron deficiency 10/21/2011  . Cholelithiasis   . Chronic diastolic heart failure ([place])   . Colon polyp   . Constipation   . Cough    2nd to reflux  . Debility, unspecified   . Dehydration   . Depression   . Diaphragm dysfunction    Right hemidiaphragm  . Disorder of bone and cartilage, unspecified   . Diverticulosis of colon   . Dysphagia   . GERD (gastroesophageal reflux disease) 02/08/2013  . History of hyperkalemia in setting of spironolactone 09/2010  . Hyperlipidemia   . Hypertension   . Hypopotassemia   . Hypothyroidism 02/08/2013  . Long term (current) use  of anticoagulants   . MDS (myelodysplastic syndrome), low grade ([place]) 10/21/2011  . Obesity   . Obesity hypoventilation syndrome ([place])   . OSA (obstructive sleep apnea)   . Osteoarthrosis, unspecified whether generalized or localized, unspecified site   . Other specified disease of white blood cells   . Pacemaker    Implanted December 2012  . Paroxysmal A-fib (HCC) with RVR   Amiodarone  . Reflux esophagitis   . Rosacea 05/15/2013  . Senile cataract   . Sinus node dysfunction (HCC)   . Tracheobronchomalacia   . Unspecified vitamin D deficiency   . Urinary incontinence   . Xerostomia     Past Surgical History:  Procedure Laterality Date  . CATARACT EXTRACTION  2008  . PACEMAKER PLACEMENT  10/21/2010   STJ, dural chamber Dr. [name]  . TOTAL KNEE ARTHROPLASTY  1999   right knee  . TOTAL KNEE ARTHROPLASTY  2003   left knee  . TUBAL LIGATION  1964  . Tummy tuck  2000   pt "almost died"; respiratory distress, became delrious    Current Outpatient Prescriptions  Medication Sig Dispense Refill  . acetaminophen (TYLENOL) 325 MG tablet Take 650 mg by mouth every 4 (four) hours as needed.     Marland Kitchen allopurinol (ZYLOPRIM) 100 MG tablet Take 100 mg by mouth daily.    Marland Kitchen amiodarone (PACERONE) 200 MG tablet Take 100 mg by mouth daily.     Marland Kitchen docusate  sodium (COLACE) 100 MG capsule Take 100 mg by mouth daily as needed for mild constipation.    Marland Kitchen ezetimibe (ZETIA) 10 MG tablet Take 10 mg by mouth daily.    . fluticasone (FLONASE) 50 MCG/ACT nasal spray Place 2 sprays into the nose 2 (two) times daily. 2 sprays in each nostril    . Levothyroxine Sodium 75 MCG CAPS Take 1 mcg by mouth daily before breakfast.    . loratadine (CLARITIN) 10 MG tablet Take 10 mg by mouth daily.    . polyethylene glycol powder (MIRALAX) powder Take 17 g by mouth daily. As directed    . potassium chloride SA (K-DUR,KLOR-CON) 20 MEQ tablet Take 40 mEq by mouth daily.    . pseudoephedrine-guaifenesin (MUCINEX D) 60-600 MG  12 hr tablet Take 1 tablet by mouth every 12 (twelve) hours.    . Rivaroxaban (XARELTO) 15 MG TABS tablet Take 1 tablet (15 mg total) by mouth daily with supper. 30 tablet   . sertraline (ZOLOFT) 25 MG tablet Take 1 tablet (25 mg total) by mouth daily. 30 tablet 5  . torsemide (DEMADEX) 20 MG tablet Take 60 mg by mouth daily.      No current facility-administered medications for this visit.     Allergies:   Codeine; Iron; Morphine; and Toviaz [fesoterodine fumarate]   Social History:  The patient  reports that she quit smoking about 34 years ago. Her smoking use included Cigarettes. She smoked 0.50 packs per day. She has never used smokeless tobacco. She reports that she drinks about 0.6 oz of alcohol per week . She reports that she does not use drugs.   Family History:  The patient's family history includes Heart disease in her brother and father.  ROS:  Please see the history of present illness.  All other systems are reviewed and otherwise negative.   PHYSICAL EXAM:  VS:  There were no vitals taken for this visit. BMI: There is no height or weight on file to calculate BMI. Very pleasant, obese WF, in no acute distress  HEENT: normocephalic, atraumatic  Neck: no JVD, carotid bruits or masses Cardiac:  RRR; no significant murmurs, no rubs, or gallops Lungs:  CTA b/l, no wheezing, rhonchi or rales  Abd: soft, nontender MS: no deformity, age appropriate atrophy Ext: no edema  Skin: warm and dry, no rash Neuro:  No gross deficits appreciated Psych: euthymic mood, full affect  PPM site is stable, no tethering or discomfort   EKG: not done today PPM check today:battery and lead testing are stable, + AMS, AF burden 1.3%, there are 2 PMT episodes the available EGM is not true PMT.  09/08/10: Echocardiogram Study Conclusions Left ventricle: Systolic function was vigorous. The estimated ejection fraction was in the range of 65% to 70%  Recent Labs: 12//6/16: BUN 27, Creat 1.77, K+  3.4 , H/H 11/34, plts 188, TSH 2.261, AST 10, ALT 7, LDL 120, HDL 54, Trigs 96 11/11/2016: TSH 2.330 12/26/2016: Hemoglobin 11.7; Platelets 209 03/06/2017: ALT 8; BUN 17; Creatinine 1.9; Potassium 3.5; Sodium 142  03/06/2017: Cholesterol 201; HDL 57; LDL Cholesterol 129; Triglycerides 73   CrCl cannot be calculated (Patient's most recent lab result is older than the maximum 21 days allowed.).   Wt Readings from Last 3 Encounters:  03/08/17 206 lb (93.4 kg)  02/06/17 202 lb 12.8 oz (92 kg)  01/11/17 208 lb (94.3 kg)     Other studies reviewed: Additional studies/records reviewed today include: summarized above    ASSESSMENT  AND PLAN:  1. PAFib     CHADS2vasc is at least 4, on Xarelto and amiodarone    Creat Cl by Jun labs 32, xarelto is appropriately dosed at 15mg  daily     She has a pulmonologist she follows with, LFTs and TSH look OK, reports annual eye exams   2. PPM      PPM functioning normally     q27mo remotes/Merlin      2. CHF (diastolic/normal EF)     Weight is stable, exam appears euvolemic  3. HLD     Being managed with her PMD  4. HTN     Mildly elevated today     Encouraged physical activity to her tolerance, she reports weekly BP checks at the ALF      Disposition: Will see her back in 6 months with Dr.Klein, sooner if needed, continue Q 3 month remote device checks.  Current medicines are reviewed at length with the patient today.  The patient did not have any concerns regarding medicines.  Haywood Lasso, PA-C 06/04/2017 10:16 AM     [facility] [place] [place] [address] [phone] (office)  [phone] (fax)

## 2017-06-06 ENCOUNTER — Encounter (INDEPENDENT_AMBULATORY_CARE_PROVIDER_SITE_OTHER): Payer: Self-pay

## 2017-06-06 ENCOUNTER — Ambulatory Visit (INDEPENDENT_AMBULATORY_CARE_PROVIDER_SITE_OTHER): Payer: Medicare Other | Admitting: Physician Assistant

## 2017-06-06 VITALS — BP 146/70 | HR 71 | Ht 64.0 in | Wt 201.0 lb

## 2017-06-06 DIAGNOSIS — I10 Essential (primary) hypertension: Secondary | ICD-10-CM | POA: Diagnosis not present

## 2017-06-06 DIAGNOSIS — I480 Paroxysmal atrial fibrillation: Secondary | ICD-10-CM | POA: Diagnosis not present

## 2017-06-06 DIAGNOSIS — I5032 Chronic diastolic (congestive) heart failure: Secondary | ICD-10-CM | POA: Diagnosis not present

## 2017-06-06 DIAGNOSIS — Z950 Presence of cardiac pacemaker: Secondary | ICD-10-CM

## 2017-06-06 NOTE — Patient Instructions (Signed)
Medication Instructions:   Your physician recommends that you continue on your current medications as directed. Please refer to the Current Medication list given to you today.   If you need a refill on your cardiac medications before your next appointment, please call your pharmacy.  Labwork: NONE ORDERED  TODAY    Testing/Procedures: NONE ORDERED  TODAY    Follow-Up:Your physician wants you to follow-up in:  [place]  [place] will receive a reminder letter in the mail two months in advance. If you don't receive a letter, please call our office to schedule the follow-up appointment.     Remote monitoring is used to monitor your Pacemaker of ICD from home. This monitoring reduces the number of office visits required to check your device to one time per year. It allows [place] to keep an eye on the functioning of your device to ensure it is working properly. You are scheduled for a device check from home on . 08-10-2017 You may send your transmission at any time that day. If you have a wireless device, the transmission will be sent automatically. After your physician reviews your transmission, you will receive a postcard with your next transmission date.     Any Other Special Instructions Will Be Listed Below (If Applicable).

## 2017-07-14 DIAGNOSIS — H52223 Regular astigmatism, bilateral: Secondary | ICD-10-CM | POA: Diagnosis not present

## 2017-07-14 DIAGNOSIS — G43001 Migraine without aura, not intractable, with status migrainosus: Secondary | ICD-10-CM | POA: Diagnosis not present

## 2017-07-14 DIAGNOSIS — H524 Presbyopia: Secondary | ICD-10-CM | POA: Diagnosis not present

## 2017-07-14 DIAGNOSIS — H5203 Hypermetropia, bilateral: Secondary | ICD-10-CM | POA: Diagnosis not present

## 2017-07-27 DIAGNOSIS — Z23 Encounter for immunization: Secondary | ICD-10-CM | POA: Diagnosis not present

## 2017-08-10 ENCOUNTER — Ambulatory Visit (INDEPENDENT_AMBULATORY_CARE_PROVIDER_SITE_OTHER): Payer: Medicare Other | Admitting: *Deleted

## 2017-08-10 DIAGNOSIS — I495 Sick sinus syndrome: Secondary | ICD-10-CM

## 2017-08-10 NOTE — Progress Notes (Signed)
Remote pacemaker transmission.   

## 2017-08-11 ENCOUNTER — Encounter: Payer: Self-pay | Admitting: Cardiology

## 2017-08-28 LAB — CUP PACEART REMOTE DEVICE CHECK
Brady Statistic AP VP Percent: 1 %
Date Time Interrogation Session: 20181108070007
Implantable Lead Location: 753860
Implantable Pulse Generator Implant Date: 20120119
Lead Channel Pacing Threshold Pulse Width: 0.5 ms
Lead Channel Sensing Intrinsic Amplitude: 10 mV
Lead Channel Setting Pacing Amplitude: 1.5 V
Lead Channel Setting Pacing Amplitude: 2.5 V
Lead Channel Setting Pacing Pulse Width: 0.5 ms
MDC IDC LEAD IMPLANT DT: 20120119
MDC IDC LEAD IMPLANT DT: 20120119
MDC IDC LEAD LOCATION: 753859
MDC IDC MSMT BATTERY REMAINING LONGEVITY: 81 mo
MDC IDC MSMT BATTERY REMAINING PERCENTAGE: 65 %
MDC IDC MSMT BATTERY VOLTAGE: 2.9 V
MDC IDC MSMT LEADCHNL RA IMPEDANCE VALUE: 430 Ohm
MDC IDC MSMT LEADCHNL RA PACING THRESHOLD AMPLITUDE: 0.5 V
MDC IDC MSMT LEADCHNL RA PACING THRESHOLD PULSEWIDTH: 0.5 ms
MDC IDC MSMT LEADCHNL RA SENSING INTR AMPL: 1 mV
MDC IDC MSMT LEADCHNL RV IMPEDANCE VALUE: 800 Ohm
MDC IDC MSMT LEADCHNL RV PACING THRESHOLD AMPLITUDE: 0.75 V
MDC IDC SET LEADCHNL RV SENSING SENSITIVITY: 2 mV
MDC IDC STAT BRADY AP VS PERCENT: 64 %
MDC IDC STAT BRADY AS VP PERCENT: 1 %
MDC IDC STAT BRADY AS VS PERCENT: 36 %
MDC IDC STAT BRADY RA PERCENT PACED: 62 %
MDC IDC STAT BRADY RV PERCENT PACED: 1 %
Pulse Gen Model: 2210
Pulse Gen Serial Number: 7198677

## 2017-09-04 DIAGNOSIS — E038 Other specified hypothyroidism: Secondary | ICD-10-CM | POA: Diagnosis not present

## 2017-09-04 DIAGNOSIS — E039 Hypothyroidism, unspecified: Secondary | ICD-10-CM | POA: Diagnosis not present

## 2017-09-04 DIAGNOSIS — D469 Myelodysplastic syndrome, unspecified: Secondary | ICD-10-CM | POA: Diagnosis not present

## 2017-09-04 DIAGNOSIS — D649 Anemia, unspecified: Secondary | ICD-10-CM | POA: Diagnosis not present

## 2017-09-04 DIAGNOSIS — E669 Obesity, unspecified: Secondary | ICD-10-CM | POA: Diagnosis not present

## 2017-09-04 LAB — BASIC METABOLIC PANEL
BUN: 21 (ref 4–21)
Creatinine: 1.6 — AB (ref 0.5–1.1)
Glucose: 95
Potassium: 3.8 (ref 3.4–5.3)
Sodium: 144 (ref 137–147)

## 2017-09-04 LAB — HEPATIC FUNCTION PANEL
ALT: 7 (ref 7–35)
AST: 10 — AB (ref 13–35)
Alkaline Phosphatase: 99 (ref 25–125)
Bilirubin, Total: 0.4

## 2017-09-04 LAB — CBC AND DIFFERENTIAL
HCT: 34 — AB (ref 36–46)
Hemoglobin: 10.9 — AB (ref 12.0–16.0)
Platelets: 168 (ref 150–399)
WBC: 6.6

## 2017-09-04 LAB — TSH: TSH: 3.24 (ref 0.41–5.90)

## 2017-09-06 ENCOUNTER — Non-Acute Institutional Stay: Payer: Medicare Other | Admitting: Internal Medicine

## 2017-09-06 ENCOUNTER — Encounter: Payer: Self-pay | Admitting: Internal Medicine

## 2017-09-06 VITALS — BP 150/70 | HR 75 | Wt 202.0 lb

## 2017-09-06 DIAGNOSIS — D4620 Refractory anemia with excess of blasts, unspecified: Secondary | ICD-10-CM

## 2017-09-06 DIAGNOSIS — G4733 Obstructive sleep apnea (adult) (pediatric): Secondary | ICD-10-CM | POA: Diagnosis not present

## 2017-09-06 DIAGNOSIS — I5032 Chronic diastolic (congestive) heart failure: Secondary | ICD-10-CM

## 2017-09-06 DIAGNOSIS — I10 Essential (primary) hypertension: Secondary | ICD-10-CM | POA: Diagnosis not present

## 2017-09-06 DIAGNOSIS — Z8619 Personal history of other infectious and parasitic diseases: Secondary | ICD-10-CM

## 2017-09-06 DIAGNOSIS — R5382 Chronic fatigue, unspecified: Secondary | ICD-10-CM | POA: Diagnosis not present

## 2017-09-06 DIAGNOSIS — D6869 Other thrombophilia: Secondary | ICD-10-CM | POA: Diagnosis not present

## 2017-09-06 DIAGNOSIS — I4891 Unspecified atrial fibrillation: Secondary | ICD-10-CM

## 2017-09-06 DIAGNOSIS — I480 Paroxysmal atrial fibrillation: Secondary | ICD-10-CM

## 2017-09-06 DIAGNOSIS — R4789 Other speech disturbances: Secondary | ICD-10-CM

## 2017-09-06 DIAGNOSIS — D46Z Other myelodysplastic syndromes: Secondary | ICD-10-CM

## 2017-09-06 NOTE — Progress Notes (Signed)
Location:  Occupational psychologist of Service:  Clinic (12)  Provider: Ceri Mayer L. Mariea Cervantes, D.O., C.M.D.  Code Status: DNR Goals of Care:  Advanced Directives 09/06/2017  Does Patient Have a Medical Advance Directive? Yes  Type of Advance Directive Out of facility DNR (pink MOST or yellow form);Healthcare Power of Attorney  Does patient want to make changes to medical advance directive? No - Patient declined  Copy of [place] in Chart? Yes  Pre-existing out of facility DNR order (yellow form or pink MOST form) Yellow form placed in chart (order not valid for inpatient use)   Chief Complaint  Patient presents with  . Medical Management of Chronic Issues    79mth follow-up    HPI: Patient is a 83 y.o. female seen today for medical management of chronic diseases.    She was walking very fast to get over here from AL and her BP was elevated.    Says she had "5 bites across her abdomen" in the middle.  Says she did not have shingles this time.  She previously had shingles on her face back when she was ill with respiratory illness.  Thinks this last episode was spider bites.  Not itchy and minimally painful. Finished valtrex.  Says she was quarantined in nasty weather so it wasn't so bad.    She is sleepy all of the time.  Then naps unintentionally and can't sleep at night.  She continues to think she's going to get up and be active the next day so it doesn't happen again.  She did walk down here today instead of using her scooter.  She can hear bones in her hip and knee clicking as she walks.  Says her knee replacements are from '99 and 2003.  She was told by a chiropractor that her pelvis was tilted and that affected a bad disc in her lower back.  She may get a new bipap when she sees Dr. Halford Chessman.  She got a cleaning apparatus for it.  Says she dreams more than she used to and remembers them.    Says she's having trouble with word-finding.  Will be irritating  that she can't come up with simple words.    She has lost a little weight with eating more in her room--making her own salads.  Has lost 8 lbs by her report--about right per the flowsheet in [facility].    Says she is having gas and can't control passing it.    Past Medical History:  Diagnosis Date  . Allergic rhinitis   . Anemia of renal disease 10/21/2011  . Anemia, iron deficiency 10/21/2011  . Cholelithiasis   . Chronic diastolic heart failure ([place])   . Colon polyp   . Constipation   . Cough    2nd to reflux  . Debility, unspecified   . Dehydration   . Depression   . Diaphragm dysfunction    Right hemidiaphragm  . Disorder of bone and cartilage, unspecified   . Diverticulosis of colon   . Dysphagia   . GERD (gastroesophageal reflux disease) 02/08/2013  . History of hyperkalemia in setting of spironolactone 09/2010  . Hyperlipidemia   . Hypertension   . Hypopotassemia   . Hypothyroidism 02/08/2013  . Long term (current) use of anticoagulants   . MDS (myelodysplastic syndrome), low grade ([place]) 10/21/2011  . Obesity   . Obesity hypoventilation syndrome ([place])   . OSA (obstructive sleep apnea)   . Osteoarthrosis, unspecified  whether generalized or localized, unspecified site   . Other specified disease of white blood cells   . Pacemaker    Implanted December 2012  . Paroxysmal A-fib (HCC) with RVR   Amiodarone  . Reflux esophagitis   . Rosacea 05/15/2013  . Senile cataract   . Sinus node dysfunction (HCC)   . Tracheobronchomalacia   . Unspecified vitamin D deficiency   . Urinary incontinence   . Xerostomia     Past Surgical History:  Procedure Laterality Date  . CATARACT EXTRACTION  2008  . PACEMAKER PLACEMENT  10/21/2010   STJ, dural chamber Dr. [name]  . TOTAL KNEE ARTHROPLASTY  1999   right knee  . TOTAL KNEE ARTHROPLASTY  2003   left knee  . TUBAL LIGATION  1964  . Tummy tuck  2000   pt "almost died"; respiratory distress, became delrious    Allergies  Allergen  Reactions  . Codeine     Extreme lethargy  . Iron     By infusion  . Morphine   . Toviaz [Fesoterodine Fumarate]     Outpatient Encounter Medications as of 09/06/2017  Medication Sig  . acetaminophen (TYLENOL) 325 MG tablet Take 650 mg by mouth every 4 (four) hours as needed.   Marland Kitchen allopurinol (ZYLOPRIM) 100 MG tablet Take 100 mg by mouth daily.  Marland Kitchen amiodarone (PACERONE) 200 MG tablet Take 100 mg by mouth daily.   Marland Kitchen docusate sodium (COLACE) 100 MG capsule Take 100 mg by mouth daily as needed for mild constipation.  Marland Kitchen ezetimibe (ZETIA) 10 MG tablet Take 10 mg by mouth daily.  . fluticasone (FLONASE) 50 MCG/ACT nasal spray Place 2 sprays into the nose 2 (two) times daily. 2 sprays in each nostril  . Levothyroxine Sodium 75 MCG CAPS Take 1 mcg by mouth daily before breakfast.  . polyethylene glycol powder (MIRALAX) powder Take 17 g by mouth daily. As directed  . potassium chloride SA (K-DUR,KLOR-CON) 20 MEQ tablet Take 40 mEq by mouth daily.  . pseudoephedrine-guaifenesin (MUCINEX D) 60-600 MG 12 hr tablet Take 1 tablet by mouth every 12 (twelve) hours.  . Rivaroxaban (XARELTO) 15 MG TABS tablet Take 1 tablet (15 mg total) by mouth daily with supper.  . sertraline (ZOLOFT) 25 MG tablet Take 1 tablet (25 mg total) by mouth daily.  Marland Kitchen torsemide (DEMADEX) 20 MG tablet Take 60 mg by mouth daily.   . [DISCONTINUED] loratadine (CLARITIN) 10 MG tablet Take 10 mg by mouth daily.   No facility-administered encounter medications on file as of 09/06/2017.     Review of Systems:  Review of Systems  Constitutional: Positive for malaise/fatigue and weight loss. Negative for chills and fever.       Intentional weight loss  HENT: Negative for congestion.   Eyes: Negative for blurred vision.  Respiratory: Negative for cough and shortness of breath.   Cardiovascular: Negative for chest pain and palpitations.  Gastrointestinal: Negative for abdominal pain, blood in stool, constipation and melena.    Genitourinary: Negative for dysuria.  Musculoskeletal: Negative for falls and joint pain.  Skin: Positive for rash. Negative for itching.       Resolved now  Neurological: Negative for dizziness, loss of consciousness and weakness.       Some mild word-finding difficulty  Psychiatric/Behavioral: Negative for depression and memory loss.    Health Maintenance  Topic Date Due  . TETANUS/TDAP  12/30/1950  . INFLUENZA VACCINE  05/03/2017  . DEXA SCAN  Completed  . PNA vac  Low Risk Adult  Completed    Physical Exam: Vitals:   09/06/17 1019  BP: (!) 150/70  Pulse: 75  SpO2: 95%  Weight: 202 lb (91.6 kg)   Body mass index is 34.67 kg/m. Physical Exam  Constitutional: She is oriented to person, place, and time. She appears well-developed and well-nourished. No distress.  HENT:  Head: Normocephalic and atraumatic.  Cardiovascular: Intact distal pulses.  irreg irreg  Pulmonary/Chest: Effort normal and breath sounds normal. No respiratory distress.  Abdominal: Bowel sounds are normal.  Musculoskeletal: Normal range of motion.  Walks w/o assistive device  Neurological: She is alert and oriented to person, place, and time.  Skin: Skin is warm and dry. Capillary refill takes less than 2 seconds.  Psychiatric: She has a normal mood and affect.    Labs reviewed: Basic Metabolic Panel: Recent Labs    11/11/16 1304 12/26/16 0200 03/06/17 0600  NA  --  144 142  K  --  4.1 3.5  BUN  --  20 17  CREATININE  --  1.6* 1.9*  TSH 2.330  --   --    Liver Function Tests: Recent Labs    11/11/16 1304 12/26/16 0200 03/06/17 0600  AST 16 13 13   ALT 11 7 8   ALKPHOS 103 89 97  BILITOT 0.4  --   --   PROT 7.2  --   --   ALBUMIN 4.1  --   --    No results for input(s): LIPASE, AMYLASE in the last 8760 hours. No results for input(s): AMMONIA in the last 8760 hours. CBC: Recent Labs    12/26/16 0200  WBC 8.3  HGB 11.7*  HCT 35*  PLT 209   Lipid Panel: Recent Labs     03/06/17 0600  CHOL 201*  HDL 57  LDLCALC 129  TRIG 73   Lab Results  Component Value Date   HGBA1C 5.5 09/06/2016   Assessment/Plan 1. OSA treated with BiPAP -continue bipap use, thinks she may be up for a new device due to age of current one  2. Chronic fatigue -could be due to low grade MDS, age, but no other clear explanation, using bipap as directed, wonders about a repeat sleep study to make sure settings are ideal  3. Word finding difficulty -new, likely age-related, no other signs of dementia or strokes  4. Chronic diastolic heart failure (HCC) -controlled, no edema, cont torsemide  5. Essential hypertension -bp at goal typically, elevated when hurried here today, cont same meds and monitor  6. Paroxysmal atrial fibrillation (HCC) -no issues with rate control on amiodarone, xarelto for anticoagulation  7.  Hypercoagulation state ([place]) due to afib -cont xarelto, tolerating well  8. History of shingles -on abdomen and previously severe case, now resolved  Labs/tests ordered:  labs before next visit in 6 mos Next appt:  6 mos med mgt  Graciano Batson L. Licia Harl, D.O. [facility] [address], [place] [address] Cell Phone (Mon-Fri 8am-5pm):  [phone] On Call:  [phone] & follow prompts after 5pm & weekends Office Phone:  [phone] Office Fax:  [phone]

## 2017-09-11 DIAGNOSIS — I4891 Unspecified atrial fibrillation: Secondary | ICD-10-CM | POA: Insufficient documentation

## 2017-09-11 DIAGNOSIS — D6869 Other thrombophilia: Secondary | ICD-10-CM | POA: Insufficient documentation

## 2017-09-18 ENCOUNTER — Telehealth: Payer: Self-pay | Admitting: Pulmonary Disease

## 2017-09-18 NOTE — Telephone Encounter (Signed)
rec'd fax from [facility] stating needing a walk on the patient for their POC. The patient only uses o2 at night with cpap machine. Per VS since the patient is only using O2 at night, a walk test is not needed at this time.

## 2017-10-19 ENCOUNTER — Encounter: Payer: Self-pay | Admitting: Internal Medicine

## 2017-11-09 ENCOUNTER — Ambulatory Visit (INDEPENDENT_AMBULATORY_CARE_PROVIDER_SITE_OTHER): Payer: Medicare Other | Admitting: *Deleted

## 2017-11-09 DIAGNOSIS — I495 Sick sinus syndrome: Secondary | ICD-10-CM

## 2017-11-09 NOTE — Progress Notes (Signed)
Remote pacemaker transmission.   

## 2017-11-12 DIAGNOSIS — R05 Cough: Secondary | ICD-10-CM | POA: Diagnosis not present

## 2017-11-14 ENCOUNTER — Non-Acute Institutional Stay: Payer: Medicare Other | Admitting: Internal Medicine

## 2017-11-14 ENCOUNTER — Encounter: Payer: Self-pay | Admitting: Internal Medicine

## 2017-11-14 DIAGNOSIS — J209 Acute bronchitis, unspecified: Secondary | ICD-10-CM | POA: Diagnosis not present

## 2017-11-14 NOTE — Progress Notes (Signed)
Patient ID: Cindy Cervantes, female   DOB: 1932/09/01, 84 y.o.   MRN: [id number]  Location:  [facility] Room Number: 956 Place of Service:  ALF [phone]) Provider:  Gayland Curry, DO  Patient Care Team: Gayland Curry, DO as PCP - General (Geriatric Medicine) Mardene Celeste, NP as Nurse Practitioner (Geriatric Medicine) [facility] Pear, MD as Consulting Physician (Pulmonary Disease) Larey Dresser, MD as Consulting Physician (Cardiology) Lafayette Dragon, MD (Inactive) as Consulting Physician (Gastroenterology) Volanda Napoleon, MD as Consulting Physician (Oncology) Ninetta Lights, MD as Consulting Physician (Orthopedic Surgery) Sydnee Levans, MD as Consulting Physician (Dermatology) Prentiss Bells, MD as Consulting Physician (Ophthalmology)  Extended Emergency Contact Information Primary Emergency Contact: Gooding,Leslie Address: [place], [place] Phone: [phone] Relation: Daughter  Code Status:  DNR Goals of care: Advanced Directive information Advanced Directives 11/14/2017  Does Patient Have a Medical Advance Directive? Yes  Type of Paramedic of [place];Out of facility DNR (pink MOST or yellow form)  Does patient want to make changes to medical advance directive? No - Patient declined  Copy of [place] in Chart? Yes  Pre-existing out of facility DNR order (yellow form or pink MOST form) Yellow form placed in chart (order not valid for inpatient use)     Chief Complaint  Patient presents with  . Acute Visit    bronchitis, not feeling any better    HPI:  Pt is a 84 y.o. female with h/o chronic diastolic chf, paroxysmal afib, OSA seen today for an acute visit for bronchitis, malaise, no energy, continued cough and congestion, fear she has pneumonia.  Seen in her AL apt today.  She feels fatigued,  rundown.  She admits she actually feels a little better today than yesterday.  She is still eating and drinking well.    She has been receiving duonebs, mucinex and robitussin.  Her symptoms have now been going on for over a week and she is frustrated that she has not recovered.  She's tired of coughing.  Her sputum is yellow at times, other times white. She's been afebrile.  She denies myalgias.  Chest xray was negative for infiltrates.     Past Medical History:  Diagnosis Date  . Allergic rhinitis   . Anemia of renal disease 10/21/2011  . Anemia, iron deficiency 10/21/2011  . Cholelithiasis   . Chronic diastolic heart failure ([place])   . Colon polyp   . Constipation   . Cough    2nd to reflux  . Debility, unspecified   . Dehydration   . Depression   . Diaphragm dysfunction    Right hemidiaphragm  . Disorder of bone and cartilage, unspecified   . Diverticulosis of colon   . Dysphagia   . GERD (gastroesophageal reflux disease) 02/08/2013  . History of hyperkalemia in setting of spironolactone 09/2010  . Hyperlipidemia   . Hypertension   . Hypopotassemia   . Hypothyroidism 02/08/2013  . Long term (current) use of anticoagulants   . MDS (myelodysplastic syndrome), low grade ([place]) 10/21/2011  . Obesity   . Obesity hypoventilation syndrome ([place])   . OSA (obstructive sleep apnea)   . Osteoarthrosis, unspecified whether generalized or localized, unspecified site   . Other specified disease of white blood cells   . Pacemaker    Implanted December 2012  . Paroxysmal A-fib (  [place]) with RVR   Amiodarone  . Reflux esophagitis   . Rosacea 05/15/2013  . Senile cataract   . Sinus node dysfunction (HCC)   . Tracheobronchomalacia   . Unspecified vitamin D deficiency   . Urinary incontinence   . Xerostomia    Past Surgical History:  Procedure Laterality Date  . CATARACT EXTRACTION  2008  . PACEMAKER PLACEMENT  10/21/2010   STJ, dural chamber Dr. [name]  . TOTAL KNEE ARTHROPLASTY  1999    right knee  . TOTAL KNEE ARTHROPLASTY  2003   left knee  . TUBAL LIGATION  1964  . Tummy tuck  2000   pt "almost died"; respiratory distress, became delrious    Allergies  Allergen Reactions  . Codeine     Extreme lethargy  . Iron     By infusion  . Morphine   . Toviaz [Fesoterodine Fumarate]     Outpatient Encounter Medications as of 11/14/2017  Medication Sig  . acetaminophen (TYLENOL) 325 MG tablet Take 650 mg by mouth every 4 (four) hours as needed.   Marland Kitchen allopurinol (ZYLOPRIM) 100 MG tablet Take 100 mg by mouth daily.  Marland Kitchen amiodarone (PACERONE) 200 MG tablet Take 100 mg by mouth daily.   Marland Kitchen Dextromethorphan-Guaifenesin 10-100 MG/5ML SOLN Take 15 mLs by mouth 4 (four) times daily as needed.  . docusate sodium (COLACE) 100 MG capsule Take 100 mg by mouth daily as needed for mild constipation.  Marland Kitchen ezetimibe (ZETIA) 10 MG tablet Take 10 mg by mouth daily.  . fluticasone (FLONASE) 50 MCG/ACT nasal spray Place 2 sprays into the nose 2 (two) times daily. 2 sprays in each nostril  . ipratropium-albuterol (DUONEB) 0.5-2.5 (3) MG/3ML SOLN Take 3 mLs by nebulization 3 (three) times daily.  . Levothyroxine Sodium 75 MCG CAPS Take 1 mcg by mouth daily before breakfast.  . polyethylene glycol powder (MIRALAX) powder Take 17 g by mouth daily. As directed  . potassium chloride SA (K-DUR,KLOR-CON) 20 MEQ tablet Take 40 mEq by mouth daily.  . pseudoephedrine-guaifenesin (MUCINEX D) 60-600 MG 12 hr tablet Take 1 tablet by mouth every 12 (twelve) hours.  . Rivaroxaban (XARELTO) 15 MG TABS tablet Take 1 tablet (15 mg total) by mouth daily with supper.  . sertraline (ZOLOFT) 25 MG tablet Take 1 tablet (25 mg total) by mouth daily.  Marland Kitchen torsemide (DEMADEX) 20 MG tablet Take 60 mg by mouth daily.    No facility-administered encounter medications on file as of 11/14/2017.     Review of Systems  Constitutional: Positive for activity change and fatigue. Negative for appetite change, chills and fever.  HENT:  Positive for congestion and sore throat. Negative for sinus pain.        Sore throat is from coughing  Eyes: Negative for visual disturbance.  Respiratory: Positive for cough. Negative for chest tightness, shortness of breath and wheezing.   Cardiovascular: Negative for chest pain, palpitations and leg swelling.  Gastrointestinal: Negative for abdominal pain.  Genitourinary: Negative for dysuria.  Musculoskeletal: Negative for arthralgias, gait problem and myalgias.  Neurological: Positive for weakness. Negative for dizziness.  Psychiatric/Behavioral: Negative for agitation and confusion.    Immunization History  Administered Date(s) Administered  . Influenza Inj Mdck Quad Pf 07/21/2016  . Influenza Split 07/08/2011  . Influenza Whole 07/06/2009, 07/03/2012, 07/16/2013  . Influenza-Unspecified 07/08/2014, 07/16/2015, 07/27/2017  . PPD Test 11/02/2011  . Pneumococcal Conjugate-13 01/13/2015  . Pneumococcal Polysaccharide-23 10/27/2010  . Zoster Recombinat (Shingrix) 10/12/2017   Pertinent  Health Maintenance  Due  Topic Date Due  . INFLUENZA VACCINE  Completed  . DEXA SCAN  Completed  . PNA vac Low Risk Adult  Completed   Fall Risk  03/08/2017 01/11/2017 09/07/2016 06/01/2016 03/11/2015  Falls in the past year? No No No No No  Comment - - - [place] Telephone Survey: data to providers prior to load -  Number falls in past yr: - - - - -  Injury with Fall? - - - - -   Functional Status Survey:    Vitals:   11/14/17 1449  BP: 125/72  Pulse: 82  Resp: 20  Temp: 98 F (36.7 C)  TempSrc: Oral  SpO2: 91%  Weight: 201 lb (91.2 kg)   Body mass index is 34.5 kg/m. Physical Exam  Constitutional: She is oriented to person, place, and time. She appears well-developed. No distress.  HENT:  Head: Normocephalic and atraumatic.  Not much nasal mucus, mild PND on exam  Eyes: Conjunctivae are normal.  Neck: Neck supple.  Cardiovascular:  No murmur heard. irreg irreg  Pulmonary/Chest:  Effort normal.  Coarse rhonchi in upper airways, symmetric exam, no diminished breath sounds  Abdominal: Bowel sounds are normal. She exhibits no distension.  Musculoskeletal: Normal range of motion.  Lymphadenopathy:    She has no cervical adenopathy.  Neurological: She is alert and oriented to person, place, and time.  Skin: Skin is warm and dry.  Psychiatric: She has a normal mood and affect.    Labs reviewed: Recent Labs    12/26/16 0200 03/06/17 0600 09/04/17 0600  NA 144 142 144  K 4.1 3.5 3.8  BUN 20 17 21   CREATININE 1.6* 1.9* 1.6*   Recent Labs    12/26/16 0200 03/06/17 0600 09/04/17 0600  AST 13 13 10*  ALT 7 8 7   ALKPHOS 89 97 99   Recent Labs    12/26/16 0200 09/04/17 0600  WBC 8.3 6.6  HGB 11.7* 10.9*  HCT 35* 34*  PLT 209 168   Lab Results  Component Value Date   TSH 3.24 09/04/2017   Lab Results  Component Value Date   HGBA1C 5.5 09/06/2016   Lab Results  Component Value Date   CHOL 201 (A) 03/06/2017   HDL 57 03/06/2017   LDLCALC 129 03/06/2017   TRIG 73 03/06/2017   CHOLHDL 2.2 09/08/2010   CXR negative for acute disease, infiltrates  Assessment/Plan 1. Acute bronchitis, unspecified organism -will tx with doxycycline 100mg  po bid for 10 days, florastor 250mg  po bid for 10 days -cont duonebs, vitals to check for fever, hypoxia for next 72 hrs -f/u cbc, bmp to check for leukocytosis   Family/ staff Communication: discussed with AL nurse and nurse manager  Labs/tests ordered:  Cbc, bmp  Cindy Cervantes, D.O. [facility] [address], [place] [address] Cell Phone (Mon-Fri 8am-5pm):  [phone] On Call:  [phone] & follow prompts after 5pm & weekends Office Phone:  [phone] Office Fax:  [phone]

## 2017-11-15 ENCOUNTER — Encounter: Payer: Self-pay | Admitting: Cardiology

## 2017-11-15 DIAGNOSIS — I10 Essential (primary) hypertension: Secondary | ICD-10-CM | POA: Diagnosis not present

## 2017-11-22 DIAGNOSIS — R531 Weakness: Secondary | ICD-10-CM | POA: Diagnosis not present

## 2017-11-22 DIAGNOSIS — D649 Anemia, unspecified: Secondary | ICD-10-CM | POA: Diagnosis not present

## 2017-11-23 DIAGNOSIS — D508 Other iron deficiency anemias: Secondary | ICD-10-CM | POA: Diagnosis not present

## 2017-11-23 DIAGNOSIS — D519 Vitamin B12 deficiency anemia, unspecified: Secondary | ICD-10-CM | POA: Diagnosis not present

## 2017-11-23 DIAGNOSIS — D464 Refractory anemia, unspecified: Secondary | ICD-10-CM | POA: Diagnosis not present

## 2017-11-23 DIAGNOSIS — D649 Anemia, unspecified: Secondary | ICD-10-CM | POA: Diagnosis not present

## 2017-11-23 DIAGNOSIS — D509 Iron deficiency anemia, unspecified: Secondary | ICD-10-CM | POA: Diagnosis not present

## 2017-11-27 ENCOUNTER — Telehealth: Payer: Self-pay | Admitting: Internal Medicine

## 2017-11-27 NOTE — Telephone Encounter (Signed)
Spoke with [facility] representative in regards to patient's HR.Cindy Cervantes   They have made an appointment with Dr [name] 2/27, but if symptoms worsen, they should take immediate action.   They verbalized understanding.Cindy KitchenMarland Cervantes

## 2017-11-27 NOTE — Telephone Encounter (Signed)
New message     Cindy Cervantes from [facility] calling with HR concerns. Cindy Cervantes states she will fax log of vitals.  Sooner appointment given  STAT if HR is under 50 or over 120 (normal HR is 60-100 beats per minute)  1) What is your heart rate? 40  2) Do you have a log of your heart rate readings (document readings)? Yes, nurse states she will fax log  3) Do you have any other symptoms? No

## 2017-11-29 ENCOUNTER — Ambulatory Visit (INDEPENDENT_AMBULATORY_CARE_PROVIDER_SITE_OTHER): Payer: Medicare Other | Admitting: Internal Medicine

## 2017-11-29 ENCOUNTER — Encounter: Payer: Self-pay | Admitting: Internal Medicine

## 2017-11-29 VITALS — BP 124/68 | HR 66 | Ht 64.0 in | Wt 195.0 lb

## 2017-11-29 DIAGNOSIS — I5032 Chronic diastolic (congestive) heart failure: Secondary | ICD-10-CM | POA: Diagnosis not present

## 2017-11-29 DIAGNOSIS — D509 Iron deficiency anemia, unspecified: Secondary | ICD-10-CM | POA: Diagnosis not present

## 2017-11-29 DIAGNOSIS — D649 Anemia, unspecified: Secondary | ICD-10-CM | POA: Diagnosis not present

## 2017-11-29 DIAGNOSIS — Z950 Presence of cardiac pacemaker: Secondary | ICD-10-CM

## 2017-11-29 DIAGNOSIS — I495 Sick sinus syndrome: Secondary | ICD-10-CM | POA: Diagnosis not present

## 2017-11-29 DIAGNOSIS — I480 Paroxysmal atrial fibrillation: Secondary | ICD-10-CM | POA: Diagnosis not present

## 2017-11-29 MED ORDER — AMIODARONE HCL 200 MG PO TABS: ORAL_TABLET | ORAL | 3 refills | Status: DC

## 2017-11-29 NOTE — Progress Notes (Signed)
Maybe as long as calling [place]    Cardiology Office Note Date:  11/29/2017  Patient ID:  Cindy Cervantes, Cindy Cervantes 1932-08-02, MRN [id number] PCP:  Gayland Curry, DO  Electrophysiology: Dr. [name] [facility] Chesley Mires, MD as Consulting Physician (Pulmonary Disease) Volanda Napoleon, MD as Consulting Physician (Oncology) Trixie Rude., MD as Consulting Physician (Ophthalmology)   Chief Complaint:  Routine EP, pacer visit  History of Present Illness: Cindy Cervantes is a 84 y.o. female  Seen in followup for her paroxysmal atrial fibrillation with a rapid ventricular response and sinus node dysfunction for which she required pacemaker implantation in the fall of 2011.   Tolerating amio and Rivaroxaban without cough, nausea or bleeding    She comes in now complaining of significant dizziness and exercise intolerance.  This falls in the wake of a bronchitic illness associated with a negative chest x-ray but profound and severe coughing.  She has remained really weak and is difficulty standing.   Date Cr K TSH LFTs Hgb  6/17    3.25 6    12/18 1.6 3.8 3.24 4 10.9      Past Medical History:  Diagnosis Date  . Allergic rhinitis   . Anemia of renal disease 10/21/2011  . Anemia, iron deficiency 10/21/2011  . Cholelithiasis   . Chronic diastolic heart failure ([place])   . Colon polyp   . Constipation   . Cough    2nd to reflux  . Debility, unspecified   . Dehydration   . Depression   . Diaphragm dysfunction    Right hemidiaphragm  . Disorder of bone and cartilage, unspecified   . Diverticulosis of colon   . Dysphagia   . GERD (gastroesophageal reflux disease) 02/08/2013  . History of hyperkalemia in setting of spironolactone 09/2010  . Hyperlipidemia   . Hypertension   . Hypopotassemia   . Hypothyroidism 02/08/2013  . Long term (current) use of anticoagulants   . MDS (myelodysplastic syndrome), low grade ([place]) 10/21/2011  . Obesity   . Obesity hypoventilation  syndrome ([place])   . OSA (obstructive sleep apnea)   . Osteoarthrosis, unspecified whether generalized or localized, unspecified site   . Other specified disease of white blood cells   . Pacemaker    Implanted December 2012  . Paroxysmal A-fib (HCC) with RVR   Amiodarone  . Reflux esophagitis   . Rosacea 05/15/2013  . Senile cataract   . Sinus node dysfunction (HCC)   . Tracheobronchomalacia   . Unspecified vitamin D deficiency   . Urinary incontinence   . Xerostomia     Past Surgical History:  Procedure Laterality Date  . CATARACT EXTRACTION  2008  . PACEMAKER PLACEMENT  10/21/2010   STJ, dural chamber Dr. [name]  . TOTAL KNEE ARTHROPLASTY  1999   right knee  . TOTAL KNEE ARTHROPLASTY  2003   left knee  . TUBAL LIGATION  1964  . Tummy tuck  2000   pt "almost died"; respiratory distress, became delrious    Current Outpatient Medications  Medication Sig Dispense Refill  . acetaminophen (TYLENOL) 325 MG tablet Take 650 mg by mouth every 4 (four) hours as needed.     Marland Kitchen allopurinol (ZYLOPRIM) 100 MG tablet Take 100 mg by mouth daily.    Marland Kitchen amiodarone (PACERONE) 200 MG tablet Take 100 mg by mouth daily.     Marland Kitchen Dextromethorphan-Guaifenesin 10-100 MG/5ML SOLN Take 15 mLs by mouth 4 (four) times daily as needed.    Marland Kitchen  docusate sodium (COLACE) 100 MG capsule Take 100 mg by mouth daily as needed for mild constipation.    Marland Kitchen ezetimibe (ZETIA) 10 MG tablet Take 10 mg by mouth daily.    . fluticasone (FLONASE) 50 MCG/ACT nasal spray Place 2 sprays into the nose 2 (two) times daily. 2 sprays in each nostril    . Levothyroxine Sodium 75 MCG CAPS Take 1 mcg by mouth daily before breakfast.    . polyethylene glycol powder (MIRALAX) powder Take 17 g by mouth daily.     . potassium chloride SA (K-DUR,KLOR-CON) 20 MEQ tablet Take 40 mEq by mouth daily.    . pseudoephedrine-guaifenesin (MUCINEX D) 60-600 MG 12 hr tablet Take 1 tablet by mouth every 12 (twelve) hours.    . Rivaroxaban (XARELTO) 15 MG  TABS tablet Take 1 tablet (15 mg total) by mouth daily with supper. 30 tablet   . torsemide (DEMADEX) 20 MG tablet Take 60 mg by mouth daily.      No current facility-administered medications for this visit.     Allergies:   Codeine; Iron; Morphine; and Toviaz [fesoterodine fumarate]   Social History:  The patient  reports that she quit smoking about 35 years ago. Her smoking use included cigarettes. She smoked 0.50 packs per day. she has never used smokeless tobacco. She reports that she drinks about 0.6 oz of alcohol per week. She reports that she does not use drugs.   Family History:  The patient's family history includes Heart disease in her brother and father.  ROS:  Please see the history of present illness.  All other systems are reviewed and otherwise negative.   PHYSICAL EXAM:  VS:  BP 124/68   Pulse 66   Ht 5\' 4"  (1.626 m)   Wt 195 lb (88.5 kg)   SpO2 97%   BMI 33.47 kg/m  BMI: Body mass index is 33.47 kg/m. Well developed and nourished in no acute distress HENT normal Neck supple with JVP-flat Clear Regular rate and rhythm, no murmurs or gallops Abd-soft with active BS No Clubbing cyanosis edema Skin-warm and dry A & Oriented  Grossly normal sensory and motor function   PPM site is stable, no tethering or discomfort   EKG:    Atrial paced rhythm at 66 Intervals 16/14/39 PVCs-frequent right bundle inferior axis with a QR in aVR and an RS in aVL    Recent Labs: 12//6/16: BUN 27, Creat 1.77, K+ 3.4 , H/H 11/34, plts 188, TSH 2.261, AST 10, ALT 7, LDL 120, HDL 54, Trigs 96 09/04/2017: ALT 7; BUN 21; Creatinine 1.6; Hemoglobin 10.9; Platelets 168; Potassium 3.8; Sodium 144; TSH 3.24  03/06/2017: Cholesterol 201; HDL 57; LDL Cholesterol 129; Triglycerides 73   CrCl cannot be calculated (Patient's most recent lab result is older than the maximum 21 days allowed.).   Wt Readings from Last 3 Encounters:  11/29/17 195 lb (88.5 kg)  11/14/17 201 lb (91.2 kg)    09/06/17 202 lb (91.6 kg)     Other studies reviewed: Additional studies/records reviewed today include: summarized above  DEVICE information: STJ PPM, implanted 10/21/10, Dr. [name]  ASSESSMENT AND PLAN:  Pacemaker-[employer] The patient's device was interrogated.  The information was reviewed. No changes were made in the programming.    Atrial fibrillation-paroxysmal  HFpEF  Treated hypothyroidism  PVCs-frequent  Atrial fibrillation is infrequent  Mode switches are mostly related to PVCs.  Reviewing PVC burden over the last months, it is complicated by the inability of the  device to appropriately detect and track; however, notwithstanding poor sensitivity for classifying the PVCs even the PVC count a significant increase in PVC burden noted over the last months  Dizziness may be related to the increase in PVCs.  This may be secondary related to heart failure.  We will recheck an echocardiogram and check a BNP.  We will also check her electrolytes in the context of her PVCs  We will also increase her amio, and reload as outlined below.  Sideeffect/ toxicity issues discussed .  We spent more than 50% of our >25 min visit in face to face counseling regarding the above   Virl Axe  11/29/2017 4:17 PM     [place] [address] [place] [address] [phone] (office)  [phone] (fax)

## 2017-11-29 NOTE — Patient Instructions (Addendum)
Medication Instructions:  Your physician has recommended you make the following change in your medication:   Amiodarone: Begin Amiodarone 400mg , two times daily for the first 2 weeks Begin Amiodarone 400mg  once per day for the following 2 weeks Then, begin Amiodarone 200mg  once per day   Labwork: BMP, BNP, TSH  Testing/Procedures: Echo  Your physician has requested that you have an echocardiogram. Echocardiography is a painless test that uses sound waves to create images of your heart. It provides your doctor with information about the size and shape of your heart and how well your heart's chambers and valves are working. This procedure takes approximately one hour. There are no restrictions for this procedure.   Follow-Up: Your physician recommends that you keep your follow-up appointment  On April 18th.    Remote monitoring is used to monitor your Pacemaker from home. This monitoring reduces the number of office visits required to check your device to one time per year. It allows [place] to keep an eye on the functioning of your device to ensure it is working properly. You are scheduled for a device check from home on 02/08/2018. You may send your transmission at any time that day. If you have a wireless device, the transmission will be sent automatically. After your physician reviews your transmission, you will receive a postcard with your next transmission date.   Any Other Special Instructions Will Be Listed Below (If Applicable).     If you need a refill on your cardiac medications before your next appointment, please call your pharmacy.

## 2017-11-30 LAB — BASIC METABOLIC PANEL
BUN / CREAT RATIO: 16 (ref 12–28)
BUN: 33 mg/dL — ABNORMAL HIGH (ref 8–27)
CHLORIDE: 102 mmol/L (ref 96–106)
CO2: 29 mmol/L (ref 20–29)
CREATININE: 2.04 mg/dL — AB (ref 0.57–1.00)
Calcium: 9 mg/dL (ref 8.7–10.3)
GFR calc Af Amer: 25 mL/min/{1.73_m2} — ABNORMAL LOW (ref >59)
GFR calc non Af Amer: 22 mL/min/{1.73_m2} — ABNORMAL LOW (ref >59)
GLUCOSE: 137 mg/dL — AB (ref 65–99)
POTASSIUM: 4.7 mmol/L (ref 3.5–5.2)
SODIUM: 146 mmol/L — AB (ref 134–144)

## 2017-11-30 LAB — TSH: TSH: 0.965 u[IU]/mL (ref 0.450–4.500)

## 2017-11-30 LAB — PRO B NATRIURETIC PEPTIDE: NT-PRO BNP: 492 pg/mL (ref 0–738)

## 2017-12-01 ENCOUNTER — Telehealth: Payer: Self-pay

## 2017-12-01 NOTE — Telephone Encounter (Signed)
Spoke with Pt regarding her increase in Creatinine to 2.06 and orders to hold her Demadex x 3 days per Dr Olin Pia verbal orders.   [facility] also [facility] [phone] and spoke with Verlee Monte, RN, her nurse for today. I relayed her lab results, Dr Olin Pia verbal medication order, and to follow up with her PCP to follow kidney function. She verbalized the orders and had no additional questions.

## 2017-12-04 LAB — CUP PACEART REMOTE DEVICE CHECK
Battery Voltage: 2.89 V
Brady Statistic AS VP Percent: 1 %
Brady Statistic RA Percent Paced: 61 %
Brady Statistic RV Percent Paced: 1 %
Date Time Interrogation Session: 20190207085214
Implantable Lead Location: 753859
Implantable Lead Location: 753860
Implantable Lead Model: 1948
Implantable Pulse Generator Implant Date: 20120119
Lead Channel Pacing Threshold Pulse Width: 0.5 ms
Lead Channel Sensing Intrinsic Amplitude: 9.4 mV
Lead Channel Setting Pacing Amplitude: 1.5 V
Lead Channel Setting Pacing Pulse Width: 0.5 ms
Lead Channel Setting Sensing Sensitivity: 2 mV
MDC IDC LEAD IMPLANT DT: 20120119
MDC IDC LEAD IMPLANT DT: 20120119
MDC IDC MSMT BATTERY REMAINING LONGEVITY: 71 mo
MDC IDC MSMT BATTERY REMAINING PERCENTAGE: 57 %
MDC IDC MSMT LEADCHNL RA IMPEDANCE VALUE: 410 Ohm
MDC IDC MSMT LEADCHNL RA PACING THRESHOLD AMPLITUDE: 0.5 V
MDC IDC MSMT LEADCHNL RA SENSING INTR AMPL: 3.1 mV
MDC IDC MSMT LEADCHNL RV IMPEDANCE VALUE: 750 Ohm
MDC IDC MSMT LEADCHNL RV PACING THRESHOLD AMPLITUDE: 0.75 V
MDC IDC MSMT LEADCHNL RV PACING THRESHOLD PULSEWIDTH: 0.5 ms
MDC IDC SET LEADCHNL RV PACING AMPLITUDE: 2.5 V
MDC IDC STAT BRADY AP VP PERCENT: 1 %
MDC IDC STAT BRADY AP VS PERCENT: 62 %
MDC IDC STAT BRADY AS VS PERCENT: 36 %
Pulse Gen Model: 2210
Pulse Gen Serial Number: 7198677

## 2017-12-05 ENCOUNTER — Telehealth: Payer: Self-pay | Admitting: Internal Medicine

## 2017-12-05 DIAGNOSIS — I5032 Chronic diastolic (congestive) heart failure: Secondary | ICD-10-CM | POA: Diagnosis not present

## 2017-12-05 DIAGNOSIS — D649 Anemia, unspecified: Secondary | ICD-10-CM | POA: Diagnosis not present

## 2017-12-05 LAB — BASIC METABOLIC PANEL
BUN: 12 (ref 4–21)
Creatinine: 1.4 — AB (ref 0.5–1.1)
Glucose: 87
Potassium: 4.9 (ref 3.4–5.3)
Sodium: 145 (ref 137–147)

## 2017-12-05 NOTE — Telephone Encounter (Signed)
Cindy Cervantes [facility] ) is calling because Cindy Cervantes medication ( Torsemide 60 mg) was to be held for 4 days and it was only held for 2 days and want to know what can they do about that . Also want to know why  Cindy Cervantes wants her to see her PCP and not him . Please call   Thanks

## 2017-12-05 NOTE — Telephone Encounter (Signed)
Spoke with [name], Therapist, sports from [employer] She stated there was a possibility Cindy Sontag's torsomide was only held for 2 days instead of 4. Cindy Cervantes states to her nurse that she did not think she had taken it for the entire 4 days. Repeat labs drawn today. I told the RN Dr [name] wanted a PCP f/u for kidney function. She verbalized understanding. No additional questions.

## 2017-12-06 ENCOUNTER — Ambulatory Visit (HOSPITAL_COMMUNITY): Payer: Medicare Other | Attending: Cardiology

## 2017-12-06 ENCOUNTER — Other Ambulatory Visit: Payer: Self-pay

## 2017-12-06 DIAGNOSIS — I493 Ventricular premature depolarization: Secondary | ICD-10-CM | POA: Diagnosis not present

## 2017-12-06 DIAGNOSIS — E669 Obesity, unspecified: Secondary | ICD-10-CM | POA: Diagnosis not present

## 2017-12-06 DIAGNOSIS — E785 Hyperlipidemia, unspecified: Secondary | ICD-10-CM | POA: Insufficient documentation

## 2017-12-06 DIAGNOSIS — Z6833 Body mass index (BMI) 33.0-33.9, adult: Secondary | ICD-10-CM | POA: Insufficient documentation

## 2017-12-06 DIAGNOSIS — I480 Paroxysmal atrial fibrillation: Secondary | ICD-10-CM

## 2017-12-06 DIAGNOSIS — I10 Essential (primary) hypertension: Secondary | ICD-10-CM | POA: Diagnosis not present

## 2017-12-06 DIAGNOSIS — I495 Sick sinus syndrome: Secondary | ICD-10-CM

## 2017-12-06 DIAGNOSIS — Z950 Presence of cardiac pacemaker: Secondary | ICD-10-CM | POA: Diagnosis not present

## 2017-12-07 ENCOUNTER — Telehealth: Payer: Self-pay | Admitting: Internal Medicine

## 2017-12-07 NOTE — Telephone Encounter (Signed)
New Message   Cindy Cervantes is calling from [facility] in reference to the patients most recent echocardiogram. She wants to discuss. She also states the patient pacemaker is not working correctly. Please call to discuss.

## 2017-12-07 NOTE — Telephone Encounter (Signed)
Spoke with patient's nurse, Beth, regarding Echo results. I explained it may take up to 7 days for results to be read and finalized. We will be calling with the results in the next week or so.   Beth was also concerned with pt's HR and wondered if the pacemaker clinic could run a report. She stated her HR has been irregular and at its highest 120bpm. She is concerned her PPM is not functioning correctly. I stated I would forward her concern to the Pacemaker clinic to review.

## 2017-12-07 NOTE — Telephone Encounter (Signed)
Returned call to [employer]. She was concerned about the battery life on PPM since amio was adjusted. Transmission 11/26/17 showed 6.4-7.1 yrs remaining. PPM will not keep HR from going fast- it will just not pace faster than 110bpm. Known AF- on Xarelto, irregular HR not concerning.

## 2017-12-13 ENCOUNTER — Encounter: Payer: Self-pay | Admitting: Internal Medicine

## 2017-12-13 ENCOUNTER — Non-Acute Institutional Stay: Payer: Medicare Other | Admitting: Internal Medicine

## 2017-12-13 VITALS — BP 140/70 | HR 63 | Temp 97.8°F | Wt 198.0 lb

## 2017-12-13 DIAGNOSIS — Z950 Presence of cardiac pacemaker: Secondary | ICD-10-CM | POA: Diagnosis not present

## 2017-12-13 DIAGNOSIS — D46Z Other myelodysplastic syndromes: Secondary | ICD-10-CM

## 2017-12-13 DIAGNOSIS — J209 Acute bronchitis, unspecified: Secondary | ICD-10-CM | POA: Diagnosis not present

## 2017-12-13 DIAGNOSIS — I480 Paroxysmal atrial fibrillation: Secondary | ICD-10-CM | POA: Diagnosis not present

## 2017-12-13 DIAGNOSIS — I5032 Chronic diastolic (congestive) heart failure: Secondary | ICD-10-CM

## 2017-12-13 DIAGNOSIS — R5382 Chronic fatigue, unspecified: Secondary | ICD-10-CM | POA: Diagnosis not present

## 2017-12-13 DIAGNOSIS — I493 Ventricular premature depolarization: Secondary | ICD-10-CM | POA: Diagnosis not present

## 2017-12-13 DIAGNOSIS — D4620 Refractory anemia with excess of blasts, unspecified: Secondary | ICD-10-CM | POA: Diagnosis not present

## 2017-12-13 NOTE — Progress Notes (Signed)
Location:  [place] of Service:  Clinic (12)  Provider: Keontre Defino L. [name], D.O., C.M.D.  Code Status: DNR Goals of Care:  Advanced Directives 12/13/2017  Does Patient Have a Medical Advance Directive? Yes  Type of Advance Directive Out of facility DNR (pink MOST or yellow form);Healthcare Power of Attorney  Does patient want to make changes to medical advance directive? No - Patient declined  Copy of [place] in Chart? Yes  Pre-existing out of facility DNR order (yellow form or pink MOST form) Yellow form placed in chart (order not valid for inpatient use)     Chief Complaint  Patient presents with  . Follow-up    cardiology follow-up    HPI: Patient is a 84 y.o. female seen today for an acute visit for f/u on kidney function after diuretic adjustments with cardiology.  Echo was reviewed with her today--great pump function and just mild chronic diastolic chf.  She had her torsemide held for 3 days on 12/01/17 and she was able to move her bowels normally.  She is on an amiodarone titration (currently at 400mg  daily).  She does not remember my visit in her room for acute bronchitis.  She then had muscle pain in her left side.  She was then very weak and shaky inside afterwards, very weak.  She was vomiting for a couple of days and lost 6 lbs.  She was running a low pulse then 11/27/17.  Blood count was checked and not significantly anemic.  Then she had her pacemaker check, she had an EKG and it showed frequent PVCs on pacer interrogation.  Her amiodarone was increased.    She is knitting now and holding her hands up makes her chest wall tender.    Past Medical History:  Diagnosis Date  . Allergic rhinitis   . Anemia of renal disease 10/21/2011  . Anemia, iron deficiency 10/21/2011  . Cholelithiasis   . Chronic diastolic heart failure ([place])   . Colon polyp   . Constipation   . Cough    2nd to reflux  . Debility, unspecified   . Dehydration   .  Depression   . Diaphragm dysfunction    Right hemidiaphragm  . Disorder of bone and cartilage, unspecified   . Diverticulosis of colon   . Dysphagia   . GERD (gastroesophageal reflux disease) 02/08/2013  . History of hyperkalemia in setting of spironolactone 09/2010  . Hyperlipidemia   . Hypertension   . Hypopotassemia   . Hypothyroidism 02/08/2013  . Long term (current) use of anticoagulants   . MDS (myelodysplastic syndrome), low grade ([place]) 10/21/2011  . Obesity   . Obesity hypoventilation syndrome ([employer])   . OSA (obstructive sleep apnea)   . Osteoarthrosis, unspecified whether generalized or localized, unspecified site   . Other specified disease of white blood cells   . Pacemaker    Implanted December 2012  . Paroxysmal A-fib (HCC) with RVR   Amiodarone  . Reflux esophagitis   . Rosacea 05/15/2013  . Senile cataract   . Sinus node dysfunction (HCC)   . Tracheobronchomalacia   . Unspecified vitamin D deficiency   . Urinary incontinence   . Xerostomia     Past Surgical History:  Procedure Laterality Date  . CATARACT EXTRACTION  2008  . PACEMAKER PLACEMENT  10/21/2010   STJ, dural chamber Dr. [name]  . TOTAL KNEE ARTHROPLASTY  1999   right knee  . TOTAL KNEE ARTHROPLASTY  2003  left knee  . TUBAL LIGATION  1964  . Tummy tuck  2000   pt "almost died"; respiratory distress, became delrious    Allergies  Allergen Reactions  . Codeine     Extreme lethargy  . Iron     By infusion  . Morphine   . Toviaz [Fesoterodine Fumarate]     Outpatient Encounter Medications as of 12/13/2017  Medication Sig  . acetaminophen (TYLENOL) 325 MG tablet Take 650 mg by mouth every 4 (four) hours as needed.   Marland Kitchen allopurinol (ZYLOPRIM) 100 MG tablet Take 100 mg by mouth daily.  Marland Kitchen amiodarone (PACERONE) 200 MG tablet Take 2 tablets (400 mg total) by mouth 2 (two) times daily for 14 days, THEN 2 tablets (400 mg total) daily for 14 days, THEN 1 tablet (200 mg total) daily.  Marland Kitchen ezetimibe (ZETIA)  10 MG tablet Take 10 mg by mouth daily.  . fluticasone (FLONASE) 50 MCG/ACT nasal spray Place 2 sprays into the nose 2 (two) times daily. 2 sprays in each nostril  . Levothyroxine Sodium 75 MCG CAPS Take 1 mcg by mouth daily before breakfast.  . polyethylene glycol powder (MIRALAX) powder Take 17 g by mouth daily.   . potassium chloride SA (K-DUR,KLOR-CON) 20 MEQ tablet Take 40 mEq by mouth daily.  . pseudoephedrine-guaifenesin (MUCINEX D) 60-600 MG 12 hr tablet Take 1 tablet by mouth every 12 (twelve) hours.  . Rivaroxaban (XARELTO) 15 MG TABS tablet Take 1 tablet (15 mg total) by mouth daily with supper.  . sertraline (ZOLOFT) 25 MG tablet Take 25 mg by mouth daily.  Marland Kitchen torsemide (DEMADEX) 20 MG tablet Take 60 mg by mouth daily.   . [DISCONTINUED] Dextromethorphan-Guaifenesin 10-100 MG/5ML SOLN Take 15 mLs by mouth 4 (four) times daily as needed.  . [DISCONTINUED] docusate sodium (COLACE) 100 MG capsule Take 100 mg by mouth daily as needed for mild constipation.   No facility-administered encounter medications on file as of 12/13/2017.     Review of Systems:  Review of Systems  Constitutional: Positive for malaise/fatigue. Negative for chills and fever.  HENT: Negative for congestion.   Eyes: Negative for blurred vision.  Respiratory: Positive for cough. Negative for sputum production and shortness of breath.        Still with mild nonproductive cough  Cardiovascular: Negative for chest pain, palpitations and leg swelling.  Gastrointestinal: Negative for abdominal pain.  Genitourinary: Negative for dysuria.  Musculoskeletal: Negative for falls and joint pain.  Skin: Negative for itching and rash.  Neurological: Negative for dizziness, loss of consciousness and weakness.  Endo/Heme/Allergies: Does not bruise/bleed easily.  Psychiatric/Behavioral: Negative for memory loss.    Health Maintenance  Topic Date Due  . TETANUS/TDAP  12/30/1950  . INFLUENZA VACCINE  Completed  . DEXA SCAN   Completed  . PNA vac Low Risk Adult  Completed    Physical Exam: Vitals:   12/13/17 1116  BP: 140/70  Pulse: 63  Temp: 97.8 F (36.6 C)  TempSrc: Oral  SpO2: 94%  Weight: 198 lb (89.8 kg)   Body mass index is 33.99 kg/m. Physical Exam  Constitutional: She is oriented to person, place, and time. She appears well-developed and well-nourished. No distress.  Cardiovascular:  Reg with occasional pvcs/pacs  Pulmonary/Chest: Effort normal and breath sounds normal. No stridor. No respiratory distress. She has no wheezes. She has no rales.  Abdominal: Soft. Bowel sounds are normal. She exhibits no distension. There is no tenderness.  Musculoskeletal: Normal range of motion.  Neurological:  She is alert and oriented to person, place, and time.  Skin: Skin is warm and dry.  Psychiatric: She has a normal mood and affect.    Labs reviewed: Basic Metabolic Panel: Recent Labs    09/04/17 0600 11/29/17 1646 12/05/17 0700  NA 144 146* 145  K 3.8 4.7 4.9  CL  --  102  --   CO2  --  29  --   GLUCOSE  --  137*  --   BUN 21 33* 12  CREATININE 1.6* 2.04* 1.4*  CALCIUM  --  9.0  --   TSH 3.24 0.965  --    Liver Function Tests: Recent Labs    12/26/16 0200 03/06/17 0600 09/04/17 0600  AST 13 13 10*  ALT 7 8 7   ALKPHOS 89 97 99   No results for input(s): LIPASE, AMYLASE in the last 8760 hours. No results for input(s): AMMONIA in the last 8760 hours. CBC: Recent Labs    12/26/16 0200 09/04/17 0600  WBC 8.3 6.6  HGB 11.7* 10.9*  HCT 35* 34*  PLT 209 168   Lipid Panel: Recent Labs    03/06/17 0600  CHOL 201*  HDL 57  LDLCALC 129  TRIG 73   Lab Results  Component Value Date   HGBA1C 5.5 09/06/2016    Procedures since last visit: No results found.  Assessment/Plan 1. MDS (myelodysplastic syndrome), low grade (HCC) -stable, not significantly anemic  2. Chronic diastolic heart failure (HCC) -renal function improved after torsemide held 3 days--during that  time, pt less constipated and felt better -she'd like to try being on just 40mg  of torsemide so we will give it a go and monitor her weight daily, recheck her bmp next tues am so I can see it by the end of my day wednesday  3. Paroxysmal atrial fibrillation (HCC) -cont current regimen with amiodarone titration, xarelto anticoagulation  4. Symptomatic PVCs -felt to be cause of symptoms of weakness, fatigue, dizziness--this is gradually improving with increased amiodarone  5. Pacemaker -in place, just checked   6. Chronic fatigue -has to some degree, but was worsened after her bronchitis and when she was having a lot of premature beats  7. Acute bronchitis, unspecified organism -resolved  Labs/tests ordered:  Bmp next tues Next appt:  03/07/2018  Leaf [facility] L. Azzie Thiem, D.O. [facility] [address], [place] [address] Cell Phone (Mon-Fri 8am-5pm):  [phone] On Call:  [phone] & follow prompts after 5pm & weekends Office Phone:  [phone] Office Fax:  [phone]

## 2017-12-19 DIAGNOSIS — R7989 Other specified abnormal findings of blood chemistry: Secondary | ICD-10-CM | POA: Diagnosis not present

## 2017-12-19 DIAGNOSIS — D649 Anemia, unspecified: Secondary | ICD-10-CM | POA: Diagnosis not present

## 2018-01-18 ENCOUNTER — Encounter: Payer: Self-pay | Admitting: Internal Medicine

## 2018-01-18 ENCOUNTER — Ambulatory Visit (INDEPENDENT_AMBULATORY_CARE_PROVIDER_SITE_OTHER): Payer: Medicare Other | Admitting: Internal Medicine

## 2018-01-18 VITALS — BP 156/74 | HR 71 | Ht 64.0 in | Wt 199.0 lb

## 2018-01-18 DIAGNOSIS — I495 Sick sinus syndrome: Secondary | ICD-10-CM | POA: Diagnosis not present

## 2018-01-18 DIAGNOSIS — Z950 Presence of cardiac pacemaker: Secondary | ICD-10-CM

## 2018-01-18 DIAGNOSIS — I480 Paroxysmal atrial fibrillation: Secondary | ICD-10-CM | POA: Diagnosis not present

## 2018-01-18 LAB — CUP PACEART INCLINIC DEVICE CHECK
Brady Statistic RA Percent Paced: 59 %
Brady Statistic RV Percent Paced: 0.76 %
Date Time Interrogation Session: 20190227211744
Implantable Lead Implant Date: 20120119
Implantable Lead Implant Date: 20120119
Implantable Lead Location: 753859
Implantable Lead Location: 753860
Implantable Lead Model: 1948
Implantable Pulse Generator Implant Date: 20120119
Lead Channel Impedance Value: 962.5 Ohm
Lead Channel Pacing Threshold Amplitude: 0.5 V
Lead Channel Pacing Threshold Pulse Width: 0.5 ms
Lead Channel Sensing Intrinsic Amplitude: 1.2 mV
Lead Channel Sensing Intrinsic Amplitude: 12 mV
Lead Channel Setting Sensing Sensitivity: 2 mV
MDC IDC LEAD IMPLANT DT: 20120119
MDC IDC LEAD IMPLANT DT: 20120119
MDC IDC LEAD LOCATION: 753859
MDC IDC LEAD LOCATION: 753860
MDC IDC MSMT BATTERY REMAINING LONGEVITY: 75 mo
MDC IDC MSMT BATTERY VOLTAGE: 2.89 V
MDC IDC MSMT LEADCHNL RA IMPEDANCE VALUE: 462.5 Ohm
MDC IDC MSMT LEADCHNL RA PACING THRESHOLD AMPLITUDE: 0.5 V
MDC IDC MSMT LEADCHNL RV PACING THRESHOLD PULSEWIDTH: 0.5 ms
MDC IDC PG IMPLANT DT: 20120119
MDC IDC SESS DTM: 20190418164345
MDC IDC SET LEADCHNL RA PACING AMPLITUDE: 1.5 V
MDC IDC SET LEADCHNL RV PACING AMPLITUDE: 2.5 V
MDC IDC SET LEADCHNL RV PACING PULSEWIDTH: 0.5 ms
Pulse Gen Model: 2210
Pulse Gen Serial Number: 7198677
Pulse Gen Serial Number: 7198677

## 2018-01-18 NOTE — Progress Notes (Signed)
Maybe as long as calling [place]    Cardiology Office Note Date:  01/18/2018  Patient ID:  Cindy, Cervantes [birth year], MRN [id number] PCP:  Gayland Curry, DO  Electrophysiology: Dr. [name] [facility] Chesley Mires, MD as Consulting Physician (Pulmonary Disease) Volanda Napoleon, MD as Consulting Physician (Oncology) Trixie Rude., MD as Consulting Physician (Ophthalmology)   Chief Complaint:  Routine EP, pacer visit  History of Present Illness: Cindy Cervantes is a 84 y.o. female  Seen in followup for her paroxysmal atrial fibrillation with a rapid ventricular response and sinus node dysfunction for which she required pacemaker implantation in the fall of 2011.   T    Date Cr K TSH LFTs Hgb  6/17    3.25 6    12/18 1.6 3.8 3.24 4 10.9   She complains of exercise intolerance.  There is moderate shortness of breath but no chest discomfort.  No peripheral edema.  This is accompanied by both physical and emotional lassitude.  She does take an antidepressant but does not think it works.  Patient denies symptoms of GI intolerance, sun sensitivity, neurological symptoms attributable to amiodarone.  Surveillance laboratories were in normal limits when checked 4 months  On Anticoagulation;  No bleeding issues    She has some weakness in her legs.  She is also developing a foot tremor.  She is concerned about Parkinson's and has an appointment scheduled later this week with neurology.    Past Medical History:  Diagnosis Date  . Allergic rhinitis   . Anemia of renal disease 10/21/2011  . Anemia, iron deficiency 10/21/2011  . Cholelithiasis   . Chronic diastolic heart failure ([place])   . Colon polyp   . Constipation   . Cough    2nd to reflux  . Debility, unspecified   . Dehydration   . Depression   . Diaphragm dysfunction    Right hemidiaphragm  . Disorder of bone and cartilage, unspecified   . Diverticulosis of colon   . Dysphagia   . GERD  (gastroesophageal reflux disease) 02/08/2013  . History of hyperkalemia in setting of spironolactone 09/2010  . Hyperlipidemia   . Hypertension   . Hypopotassemia   . Hypothyroidism 02/08/2013  . Long term (current) use of anticoagulants   . MDS (myelodysplastic syndrome), low grade ([place]) 10/21/2011  . Obesity   . Obesity hypoventilation syndrome ([place])   . OSA (obstructive sleep apnea)   . Osteoarthrosis, unspecified whether generalized or localized, unspecified site   . Other specified disease of white blood cells   . Pacemaker    Implanted December 2012  . Paroxysmal A-fib (HCC) with RVR   Amiodarone  . Reflux esophagitis   . Rosacea 05/15/2013  . Senile cataract   . Sinus node dysfunction (HCC)   . Tracheobronchomalacia   . Unspecified vitamin D deficiency   . Urinary incontinence   . Xerostomia     Past Surgical History:  Procedure Laterality Date  . CATARACT EXTRACTION  2008  . PACEMAKER PLACEMENT  10/21/2010   STJ, dural chamber Dr. [name]  . TOTAL KNEE ARTHROPLASTY  1999   right knee  . TOTAL KNEE ARTHROPLASTY  2003   left knee  . TUBAL LIGATION  1964  . Tummy tuck  2000   pt "almost died"; respiratory distress, became delrious    Current Outpatient Medications  Medication Sig Dispense Refill  . acetaminophen (TYLENOL) 325 MG tablet Take 650 mg by mouth every 4 (  four) hours as needed.     Marland Kitchen allopurinol (ZYLOPRIM) 100 MG tablet Take 100 mg by mouth daily.    Marland Kitchen amiodarone (PACERONE) 200 MG tablet Take 200 mg by mouth daily.    Marland Kitchen ezetimibe (ZETIA) 10 MG tablet Take 10 mg by mouth daily.    . fluticasone (FLONASE) 50 MCG/ACT nasal spray Place 2 sprays into the nose 2 (two) times daily. 2 sprays in each nostril    . Levothyroxine Sodium 75 MCG CAPS Take 1 mcg by mouth daily before breakfast.    . polyethylene glycol powder (MIRALAX) powder Take 17 g by mouth as needed for mild constipation.     . potassium chloride SA (K-DUR,KLOR-CON) 20 MEQ tablet Take 40 mEq by mouth  daily.    . pseudoephedrine-guaifenesin (MUCINEX D) 60-600 MG 12 hr tablet Take 1 tablet by mouth as needed for congestion.     . Rivaroxaban (XARELTO) 15 MG TABS tablet Take 1 tablet (15 mg total) by mouth daily with supper. 30 tablet   . sertraline (ZOLOFT) 25 MG tablet Take 25 mg by mouth daily.    Marland Kitchen torsemide (DEMADEX) 20 MG tablet Take 40 mg by mouth daily.     No current facility-administered medications for this visit.     Allergies:   Codeine; Iron; Morphine; and Toviaz [fesoterodine fumarate]   Social History:  The patient  reports that she quit smoking about 35 years ago. Her smoking use included cigarettes. She smoked 0.50 packs per day. She has never used smokeless tobacco. She reports that she drinks about 0.6 oz of alcohol per week. She reports that she does not use drugs.   Family History:  The patient's family history includes Heart disease in her brother and father.  ROS:  Please see the history of present illness.  All other systems are reviewed and otherwise negative.   PHYSICAL EXAM:  VS:  BP (!) 156/74   Pulse 71   Ht 5\' 4"  (1.626 m)   Wt 199 lb (90.3 kg)   SpO2 95%   BMI 34.16 kg/m  BMI: Body mass index is 34.16 kg/m. Well developed and nourished in no acute distress HENT normal Neck supple with JVP-flat Clear Regular rate and rhythm, no murmurs or gallops Abd-soft with active BS No Clubbing cyanosis edema Skin-warm and dry A & Oriented  Grossly normal sensory and motor function    Recent Labs: 12//6/16: BUN 27, Creat 1.77, K+ 3.4 , H/H 11/34, plts 188, TSH 2.261, AST 10, ALT 7, LDL 120, HDL 54, Trigs 96 09/04/2017: ALT 7; Hemoglobin 10.9; Platelets 168 11/29/2017: NT-Pro BNP 492; TSH 0.965 12/05/2017: BUN 12; Creatinine 1.4; Potassium 4.9; Sodium 145  03/06/2017: Cholesterol 201; HDL 57; LDL Cholesterol 129; Triglycerides 73   CrCl cannot be calculated (Patient's most recent lab result is older than the maximum 21 days allowed.).   Wt Readings from  Last 3 Encounters:  01/18/18 199 lb (90.3 kg)  12/13/17 198 lb (89.8 kg)  11/29/17 195 lb (88.5 kg)     Other studies reviewed: Additional studies/records reviewed today include: summarized above  DEVICE information: STJ PPM, implanted 10/21/10, Dr. [name]  ASSESSMENT AND PLAN:  Pacemaker-[employer] The patient's device was interrogated.  The information was reviewed. No changes were made in the programming.    Atrial fibrillation-paroxysmal  HFpEF  Treated hypothyroidism  PVCs-frequent  PVC burden is markedly reduced.  Heart rate dispersion is flat.  Device was reprogrammed.  In office exercise demonstrated improved heart rate and  decreased dyspnea.  She is feeling increasingly more flat.  She is on antidepressant.  Suggest that she follow-up with her PCP about this.  On Anticoagulation;  No bleeding issues   Amio tolerated and labs due in 2 mon  We spent more than 50% of our >25 min visit in face to face counseling regarding the above   Virl Axe  01/18/2018 3:59 PM     [address] [place] [address] [phone] (office)  [phone] (fax)

## 2018-01-18 NOTE — Patient Instructions (Addendum)
Medication Instructions:  Your physician recommends that you continue on your current medications as directed. Please refer to the Current Medication list given to you today.  Labwork: None ordered.  Testing/Procedures: None ordered.  Follow-Up: Your physician recommends that you schedule a follow-up appointment in:   6 months with Dr [name]  Remote monitoring is used to monitor your Pacemaker from home. This monitoring reduces the number of office visits required to check your device to one time per year. It allows [place] to keep an eye on the functioning of your device to ensure it is working properly. You are scheduled for a device check from home on 02/08/2018. You may send your transmission at any time that day. If you have a wireless device, the transmission will be sent automatically. After your physician reviews your transmission, you will receive a postcard with your next transmission date.   Any Other Special Instructions Will Be Listed Below (If Applicable).     If you need a refill on your cardiac medications before your next appointment, please call your pharmacy.

## 2018-01-24 NOTE — Progress Notes (Signed)
Cindy Cervantes was seen today in the movement disorders clinic for neurologic consultation at the request of [name], [name], DO.  The consultation is for the evaluation of possible PD.  "Dr. [name] says I have essential tremor but I want to know if I have parkinsons."  This patient is accompanied in the office by her daughter who supplements the history.    Specific Symptoms:  Tremor: Yes.  , started 20 years ago.  Only in the right hand.  Notes it most with eating, holding onto ipad, writing.   Family hx of similar:  Yes.  , mother with significant tremor.  Brother with PD in his 56's; sister with PD  Voice: no change per patient; daughter states it is more "raspy" Sleep: some trouble getting to sleep  Vivid Dreams:  Yes.   - "they are crazy"  Acting out dreams:  No. - "tethered with CPAP" Wet Pillows: No. Postural symptoms:  Yes.  , it was good until 6 months ago but used walker for 7 years due to deconditioning with a-fib and used to be on O2 and was heavy to carry  Falls?  No. Bradykinesia symptoms: difficulty getting out of a chair; denies stride length change; shuffles some per daughter Loss of smell:  No. and "I smell things that aren't there".  Occasionally smells cigarette smoke when not there and it has been going on for years.  Her daughter wonders if the smell isn't real and she doesn't realize it. Loss of taste:  No. but "nothing tastes good" Urinary Incontinence:  Yes.   (has urge incontinence and "I dribble a lot".  Some urinary urgency as well) Difficulty Swallowing:  No. Handwriting, micrographia: No., "its gotten bigger" Trouble with ADL's:  No.  Trouble buttoning clothing: No. Depression:  Intermittently "a little" depressed Memory changes:  Yes.  , trouble with words/names.  Lives at [facility] x 10 years.  They do her medications.  They also cook for her. Hallucinations:  No.  visual distortions: No. N/V:  No. Lightheaded:  Yes.  , occasionally  Syncope:  No. Diplopia:  No. Dyskinesia:  No.  States that feet are "happy feet."  She describes it as "maybe restless leg."  Daughter states that she has always moved her feet around.  If she gets up and walks it goes away.  Notes it a lot at night with R leg in bed.  Feels like skin is swollen but it is not, esp at night.  There is a burning with it.  Neuroimaging of the brain has previously been performed.  It is available for my review today.  CT brain done in 09/2016 done for smelling cigarette smoke.  There was atrophy in the frontal and temporal regions.   ALLERGIES:   Allergies  Allergen Reactions  . Codeine     Extreme lethargy  . Iron     By infusion  . Morphine   . Toviaz [Fesoterodine Fumarate]     CURRENT MEDICATIONS:  Outpatient Encounter Medications as of 01/29/2018  Medication Sig  . acetaminophen (TYLENOL) 325 MG tablet Take 650 mg by mouth every 4 (four) hours as needed.   Marland Kitchen allopurinol (ZYLOPRIM) 100 MG tablet Take 100 mg by mouth daily.  Marland Kitchen amiodarone (PACERONE) 200 MG tablet Take 200 mg by mouth daily.  Marland Kitchen ezetimibe (ZETIA) 10 MG tablet Take 10 mg by mouth daily.  . fluticasone (FLONASE) 50 MCG/ACT nasal spray Place 2 sprays into the nose 2 (two) times  daily. 2 sprays in each nostril  . Levothyroxine Sodium 75 MCG CAPS Take 1 mcg by mouth daily before breakfast.  . polyethylene glycol powder (MIRALAX) powder Take 17 g by mouth as needed for mild constipation.   . potassium chloride SA (K-DUR,KLOR-CON) 20 MEQ tablet Take 40 mEq by mouth daily.  . pseudoephedrine-guaifenesin (MUCINEX D) 60-600 MG 12 hr tablet Take 1 tablet by mouth as needed for congestion.   . Rivaroxaban (XARELTO) 15 MG TABS tablet Take 1 tablet (15 mg total) by mouth daily with supper.  . sertraline (ZOLOFT) 25 MG tablet Take 25 mg by mouth daily.  Marland Kitchen torsemide (DEMADEX) 20 MG tablet Take 40 mg by mouth daily.   No facility-administered encounter medications on file as of 01/29/2018.     PAST MEDICAL  HISTORY:   Past Medical History:  Diagnosis Date  . Allergic rhinitis   . Anemia of renal disease 10/21/2011  . Anemia, iron deficiency 10/21/2011  . Cholelithiasis   . Chronic diastolic heart failure ([place])   . Colon polyp   . Constipation   . Cough    2nd to reflux  . Debility, unspecified   . Dehydration   . Depression   . Diaphragm dysfunction    Right hemidiaphragm  . Disorder of bone and cartilage, unspecified   . Diverticulosis of colon   . Dysphagia   . GERD (gastroesophageal reflux disease) 02/08/2013  . History of hyperkalemia in setting of spironolactone 09/2010  . Hyperlipidemia   . Hypertension   . Hypopotassemia   . Hypothyroidism 02/08/2013  . Long term (current) use of anticoagulants   . MDS (myelodysplastic syndrome), low grade ([place]) 10/21/2011  . Obesity   . Obesity hypoventilation syndrome ([place])   . OSA (obstructive sleep apnea)   . Osteoarthrosis, unspecified whether generalized or localized, unspecified site   . Other specified disease of white blood cells   . Pacemaker    Implanted December 2012  . Paroxysmal A-fib (HCC) with RVR   Amiodarone  . Reflux esophagitis   . Rosacea 05/15/2013  . Senile cataract   . Sinus node dysfunction ([facility]CC)   . Tracheobronchomalacia   . Unspecified vitamin D deficiency   . Urinary incontinence   . Xerostomia     PAST SURGICAL HISTORY:   Past Surgical History:  Procedure Laterality Date  . CATARACT EXTRACTION  2008  . PACEMAKER PLACEMENT  10/21/2010   STJ, dural chamber Dr. [name]  . TOTAL KNEE ARTHROPLASTY  1999   right knee  . TOTAL KNEE ARTHROPLASTY  2003   left knee  . TUBAL LIGATION  1964  . Tummy tuck  2000   pt "almost died"; respiratory distress, became delrious    SOCIAL HISTORY:   Social History   Socioeconomic History  . Marital status: Widowed    Spouse name: Not on file  . Number of children: Not on file  . Years of education: Not on file  . Highest education level: Not on file  Occupational  History  . Occupation: retired    Fish farm manager: RETIRED  Social Needs  . Financial resource strain: Not on file  . Food insecurity:    Worry: Not on file    Inability: Not on file  . Transportation needs:    Medical: Not on file    Non-medical: Not on file  Tobacco Use  . Smoking status: Former Smoker    Packs/day: 0.50    Types: Cigarettes    Last attempt to quit: 10/03/1982  Years since quitting: 35.3  . Smokeless tobacco: Never Used  Substance and Sexual Activity  . Alcohol use: Yes    Comment: very rarely.  none in one year  . Drug use: No  . Sexual activity: Never  Lifestyle  . Physical activity:    Days per week: Not on file    Minutes per session: Not on file  . Stress: Not on file  Relationships  . Social connections:    Talks on phone: Not on file    Gets together: Not on file    Attends religious service: Not on file    Active member of club or organization: Not on file    Attends meetings of clubs or organizations: Not on file    Relationship status: Not on file  . Intimate partner violence:    Fear of current or ex partner: Not on file    Emotionally abused: Not on file    Physically abused: Not on file    Forced sexual activity: Not on file  Other Topics Concern  . Not on file  Social History Narrative   Widowed 2002 (married 1955), originally form [place]. 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 Moved from [place], [place] to [place], [place] section 2009. Moved to [place] section 2012.    Non smoker, mininmal alcohol.    Has POA   Power wheelchair, walker   Exercise none                FAMILY HISTORY:   Family Status  Relation Name Status  . Father 74 Deceased  . Mother 2 Deceased  . Sister [employer]  . Brother Norris Deceased  . Daughter Burnett Sheng  . Son [employer]  . Sister Pleas Koch Alive       spinal stenosis  . Son Lennart Pall  . Son Glendell Docker Alive     ROS:  Has DOE.  A complete 10 system review of systems was obtained and was unremarkable apart from what is mentioned above.  PHYSICAL EXAMINATION:    VITALS:   Vitals:   01/29/18 0927  BP: (!) 144/82  Pulse: 81  SpO2: 95%  Weight: 196 lb (88.9 kg)  Height: 5\' 4"  (1.626 m)    GEN:  The patient appears stated age and is in NAD. HEENT:  Normocephalic, atraumatic.  The mucous membranes are moist. The superficial temporal arteries are without ropiness or tenderness. CV:  RRR Lungs:  CTAB Neck/HEME:  There are no carotid bruits bilaterally.  Neurological examination:  Orientation: The patient is alert and oriented x3. Fund of knowledge is appropriate.  Recent and remote memory are intact.  Attention and concentration are normal.    Able to name objects and repeat phrases. Cranial nerves: There is good facial symmetry. Pupils are equal round and reactive to light bilaterally. Fundoscopic exam reveals clear margins bilaterally. Extraocular muscles are intact. The visual fields are full to confrontational testing. The speech is fluent and clear. Soft palate rises symmetrically and there is no tongue deviation. Hearing is intact to conversational tone. Sensation: Sensation is intact to light and pinprick throughout (facial, trunk, extremities). Vibration is intact at the bilateral big toe. There is no extinction with double simultaneous stimulation. There is no sensory dermatomal level identified. Motor: Strength is 5/5 in the bilateral upper and lower extremities.   Shoulder shrug is equal and symmetric.  There is no pronator drift. Deep tendon reflexes: Deep tendon reflexes are  2/4 at the bilateral biceps, triceps, brachioradialis, patella and 0/4 at the bilateral achilles. Plantar responses are downgoing bilaterally.  Movement examination: Tone: There is normal tone in the bilateral upper extremities.  The tone in the lower extremities is normal.  Abnormal movements: There is LUE  resting tremor with distraction only.  There is a R foot tremor.   There is mild tremor with archimedes spirals.  There is intention tremor that is mild.  Minimal tremor on the right with pouring water from one glass to another.   Coordination:  There is no decremation with RAM's, with any form of RAMS, including alternating supination and pronation of the forearm, hand opening and closing, finger taps, heel taps and toe taps. Gait and Station: The patient has minimal difficulty arising out of a deep-seated chair without the use of the hands. The patient's stride length is normal.  Some unsteadiness in the turns.  Good arm swing.    Lab Results  Component Value Date   TSH 0.965 11/29/2017     Chemistry      Component Value Date/Time   NA 145 12/05/2017 0700   K 4.9 12/05/2017 0700   CL 102 11/29/2017 1646   CO2 29 11/29/2017 1646   BUN 12 12/05/2017 0700   CREATININE 1.4 (A) 12/05/2017 0700   CREATININE 2.04 (H) 11/29/2017 1646   GLU 87 12/05/2017 0700      Component Value Date/Time   CALCIUM 9.0 11/29/2017 1646   ALKPHOS 99 09/04/2017 0600   AST 10 (A) 09/04/2017 0600   ALT 7 09/04/2017 0600   BILITOT 0.4 11/11/2016 1304       ASSESSMENT/PLAN:  1.  Tremor  -I think that she likely has long term essential tremor that may be increased by amiodarone.  I did tell her that up to 1/3 of patients on amiodarone will have tremor but much lower percentage will develop parkinsonism.  She has both rest and intention tremor which can happen with long term ET.  However, given her fam hx (both ET and PD), will follow her q 6 months, sooner should new issues arise.  I would not recommend medication, nor does she really want any.  Primidone would not be an option anyway due to interaction with xarelto.  -safety discussed  Much greater than 50% of this visit was spent in counseling and coordinating care.  Total face to face time:  45 min  Cc:  [name], [name], DO

## 2018-01-29 ENCOUNTER — Encounter: Payer: Self-pay | Admitting: Neurology

## 2018-01-29 ENCOUNTER — Ambulatory Visit (INDEPENDENT_AMBULATORY_CARE_PROVIDER_SITE_OTHER): Payer: Medicare Other | Admitting: Neurology

## 2018-01-29 ENCOUNTER — Other Ambulatory Visit: Payer: Self-pay

## 2018-01-29 VITALS — BP 144/82 | HR 81 | Ht 64.0 in | Wt 196.0 lb

## 2018-01-29 DIAGNOSIS — G250 Essential tremor: Secondary | ICD-10-CM | POA: Diagnosis not present

## 2018-02-08 ENCOUNTER — Ambulatory Visit (INDEPENDENT_AMBULATORY_CARE_PROVIDER_SITE_OTHER): Payer: Medicare Other | Admitting: *Deleted

## 2018-02-08 DIAGNOSIS — I495 Sick sinus syndrome: Secondary | ICD-10-CM

## 2018-02-08 NOTE — Progress Notes (Signed)
Remote pacemaker transmission.   

## 2018-02-13 ENCOUNTER — Encounter: Payer: Self-pay | Admitting: Cardiology

## 2018-02-23 ENCOUNTER — Ambulatory Visit (INDEPENDENT_AMBULATORY_CARE_PROVIDER_SITE_OTHER): Payer: Medicare Other | Admitting: Pulmonary Disease

## 2018-02-23 ENCOUNTER — Encounter: Payer: Self-pay | Admitting: Pulmonary Disease

## 2018-02-23 VITALS — BP 124/62 | HR 111 | Ht 64.0 in | Wt 197.6 lb

## 2018-02-23 DIAGNOSIS — G4733 Obstructive sleep apnea (adult) (pediatric): Secondary | ICD-10-CM | POA: Diagnosis not present

## 2018-02-23 DIAGNOSIS — J398 Other specified diseases of upper respiratory tract: Secondary | ICD-10-CM

## 2018-02-23 DIAGNOSIS — J9611 Chronic respiratory failure with hypoxia: Secondary | ICD-10-CM

## 2018-02-23 NOTE — Patient Instructions (Signed)
Will have your Bipap setting changed to 12/8 cm H2O  Call if you are still having trouble after pressure change  Follow up in 1 year

## 2018-02-23 NOTE — Progress Notes (Signed)
[facility]  Chief Complaint  Patient presents with  . Follow-up    f/u OSA, reports alot of leaking around mask. Would like order for new machine.     Vital signs: BP 124/62 (BP Location: Right Arm, Cuff Size: Normal)   Pulse (!) 111   Ht 5\' 4"  (1.626 m)   Wt 197 lb 9.6 oz (89.6 kg)   SpO2 93%   BMI 33.92 kg/m   History of Present Illness: Cindy Cervantes is a 84 y.o. female former smoker with chronic hypoxic respiratory failure 2nd to OSA, tracheobronchomalacia, and Rt hemidiaphragm elevation.  She uses Bipap nightly.  She has full face mask.  Mask leaks and mouths gets dry.  She tries using oxygen nightly, but sometimes forgets.  Feels rested during the day.  Saw Dr. Carles Collet recently to assess tremor.  Physical Exam:  General - pleasant Eyes - pupils reactive ENT - no sinus tenderness, no oral exudate, no LAN Cardiac - regular, no murmur Chest - no wheeze, rales Abd - soft, non tender Ext - no edema Skin - no rashes Neuro - normal strength Psych - normal mood   Assessment/Plan:  Chronic hypoxic respiratory failure secondary to obstructive sleep apnea, tracheobronchomalacia, and Rt hemidiaphragm paralysis. - she has trouble with mask leak and mouth dryness - will try changing her to Bipap 12/8 cm H2O - continue 2 liters oxygen at night with BiPAP   Patient Instructions  Will have your Bipap setting changed to 12/8 cm H2O  Call if you are still having trouble after pressure change  Follow up in 1 year    Chesley Mires, MD [place] 02/23/2018, 11:17 AM  Flow Sheet  Pulmonary tests: PFT's March 29 2010 >> FEV1 1.34 (72) with ratio 75 and ERV 42, DLC0 55 > corrects to 137 CT chest 10/17/10 >> GGO LUL, no PE, tracheobronchomalacia involving the visualized trachea with areas of air trapping seen throughout both lungs  Sleep tests PSG 12/14/10 >> AHI 17.9, REM 40.8 BPAP titraton 01/12/11 >> 18/13 cm H2O  with 3 liters oxygen. ONO on BiPAP 03/16/15  >> Test time 8 hrs 2 min. Mean SpO2 90.3%, low SpO2 70%. Spent 33 min with SpO2 < 88%. ONO with Bipap 02/13/17 >> test time 7 hrs 5 min.  Average SpO2 90%, low SpO2 80%.  Spent 1 hr 41 min with SpO2 < 88% Bipap 01/03/18 to 02/01/18 >> used on 30 of 30 nights with average 7 hrs 54 min.  Average AHI 7.1 with Bipap 14/10 cm H2O  Past Medical History: She  has a past medical history of Allergic rhinitis, Anemia of renal disease (10/21/2011), Anemia, iron deficiency (10/21/2011), Cholelithiasis, Chronic diastolic heart failure ([place]), Colon polyp, Constipation, Cough, Debility, unspecified, Dehydration, Depression, Diaphragm dysfunction, Disorder of bone and cartilage, unspecified, Diverticulosis of colon, Dysphagia, GERD (gastroesophageal reflux disease) (02/08/2013), History of hyperkalemia (in setting of spironolactone 09/2010), Hyperlipidemia, Hypertension, Hypopotassemia, Hypothyroidism (02/08/2013), Long term (current) use of anticoagulants, MDS (myelodysplastic syndrome), low grade ([place]) (10/21/2011), Obesity, Obesity hypoventilation syndrome ([place]), OSA (obstructive sleep apnea), Osteoarthrosis, unspecified whether generalized or localized, unspecified site, Other specified disease of white blood cells, Pacemaker, Paroxysmal A-fib ([place]) (with RVR), Reflux esophagitis, Rosacea (05/15/2013), Senile cataract, Sinus node dysfunction (HCC), Tracheobronchomalacia, Unspecified vitamin D deficiency, Urinary incontinence, and Xerostomia.  Past Surgical History: She  has a past surgical history that includes Tubal ligation (1964); Total knee arthroplasty (1999); Tummy tuck (2000); Total knee arthroplasty (2003); Cataract extraction (2008);  and pacemaker placement (10/21/2010).  Family History: Her family history includes Heart disease in her brother and father; Parkinson's disease in her brother and sister.  Social History: She  reports that she quit smoking about 35 years  ago. Her smoking use included cigarettes. She smoked 0.50 packs per day. She has never used smokeless tobacco. She reports that she drinks alcohol. She reports that she does not use drugs.  Medications: Allergies as of 02/23/2018      Reactions   Codeine    Extreme lethargy   Iron    By infusion   Morphine    Toviaz [fesoterodine Fumarate]       Medication List        Accurate as of 02/23/18 11:17 AM. Always use your most recent med list.          allopurinol 100 MG tablet Commonly known as:  ZYLOPRIM Take 100 mg by mouth daily.   amiodarone 200 MG tablet Commonly known as:  PACERONE Take 200 mg by mouth daily.   ezetimibe 10 MG tablet Commonly known as:  ZETIA Take 10 mg by mouth daily.   fluticasone 50 MCG/ACT nasal spray Commonly known as:  FLONASE Place 2 sprays into the nose 2 (two) times daily. 2 sprays in each nostril   Levothyroxine Sodium 75 MCG Caps Take 1 mcg by mouth daily before breakfast.   MIRALAX powder Generic drug:  polyethylene glycol powder Take 17 g by mouth as needed for mild constipation.   potassium chloride SA 20 MEQ tablet Commonly known as:  K-DUR,KLOR-CON Take 40 mEq by mouth daily.   pseudoephedrine-guaifenesin 60-600 MG 12 hr tablet Commonly known as:  MUCINEX D Take 1 tablet by mouth as needed for congestion.   Rivaroxaban 15 MG Tabs tablet Commonly known as:  XARELTO Take 1 tablet (15 mg total) by mouth daily with supper.   sertraline 25 MG tablet Commonly known as:  ZOLOFT Take 25 mg by mouth daily.   torsemide 20 MG tablet Commonly known as:  DEMADEX Take 40 mg by mouth daily.   TYLENOL 325 MG tablet Generic drug:  acetaminophen Take 650 mg by mouth every 4 (four) hours as needed.

## 2018-03-06 ENCOUNTER — Encounter: Payer: Self-pay | Admitting: Internal Medicine

## 2018-03-06 DIAGNOSIS — D509 Iron deficiency anemia, unspecified: Secondary | ICD-10-CM | POA: Diagnosis not present

## 2018-03-06 DIAGNOSIS — E038 Other specified hypothyroidism: Secondary | ICD-10-CM | POA: Diagnosis not present

## 2018-03-06 DIAGNOSIS — E039 Hypothyroidism, unspecified: Secondary | ICD-10-CM | POA: Diagnosis not present

## 2018-03-06 DIAGNOSIS — I480 Paroxysmal atrial fibrillation: Secondary | ICD-10-CM | POA: Diagnosis not present

## 2018-03-06 DIAGNOSIS — D649 Anemia, unspecified: Secondary | ICD-10-CM | POA: Diagnosis not present

## 2018-03-06 LAB — CUP PACEART REMOTE DEVICE CHECK
Battery Remaining Percentage: 57 %
Battery Voltage: 2.89 V
Brady Statistic AS VS Percent: 2.8 %
Brady Statistic RA Percent Paced: 97 %
Date Time Interrogation Session: 20190509072406
Implantable Lead Implant Date: 20120119
Implantable Lead Implant Date: 20120119
Implantable Lead Location: 753859
Implantable Lead Location: 753860
Implantable Pulse Generator Implant Date: 20120119
Lead Channel Impedance Value: 860 Ohm
Lead Channel Pacing Threshold Amplitude: 0.5 V
Lead Channel Pacing Threshold Amplitude: 0.75 V
Lead Channel Pacing Threshold Pulse Width: 0.5 ms
Lead Channel Pacing Threshold Pulse Width: 0.5 ms
Lead Channel Sensing Intrinsic Amplitude: 0.9 mV
Lead Channel Sensing Intrinsic Amplitude: 9.5 mV
Lead Channel Setting Pacing Amplitude: 1.5 V
Lead Channel Setting Pacing Amplitude: 2.5 V
Lead Channel Setting Pacing Pulse Width: 0.5 ms
MDC IDC MSMT BATTERY REMAINING LONGEVITY: 68 mo
MDC IDC MSMT LEADCHNL RA IMPEDANCE VALUE: 410 Ohm
MDC IDC SET LEADCHNL RV SENSING SENSITIVITY: 2 mV
MDC IDC STAT BRADY AP VP PERCENT: 1.3 %
MDC IDC STAT BRADY AP VS PERCENT: 96 %
MDC IDC STAT BRADY AS VP PERCENT: 1 %
MDC IDC STAT BRADY RV PERCENT PACED: 1.4 %
Pulse Gen Model: 2210
Pulse Gen Serial Number: 7198677

## 2018-03-06 LAB — BASIC METABOLIC PANEL
BUN: 22 — AB (ref 4–21)
Creatinine: 1.8 — AB (ref 0.5–1.1)
Glucose: 98
Potassium: 4.4 (ref 3.4–5.3)
Sodium: 146 (ref 137–147)

## 2018-03-06 LAB — CBC AND DIFFERENTIAL
HCT: 35 — AB (ref 36–46)
Hemoglobin: 11.4 — AB (ref 12.0–16.0)
Platelets: 178 (ref 150–399)
WBC: 7.5

## 2018-03-06 LAB — TSH: TSH: 3.65 (ref 0.41–5.90)

## 2018-03-06 LAB — HEPATIC FUNCTION PANEL
ALT: 11 (ref 7–35)
AST: 18 (ref 13–35)
Alkaline Phosphatase: 91 (ref 25–125)
Bilirubin, Total: 0.5

## 2018-03-07 ENCOUNTER — Encounter: Payer: Self-pay | Admitting: Internal Medicine

## 2018-03-07 ENCOUNTER — Non-Acute Institutional Stay: Payer: Medicare Other | Admitting: Internal Medicine

## 2018-03-07 VITALS — BP 148/78 | HR 76 | Temp 97.6°F | Ht 64.0 in | Wt 199.0 lb

## 2018-03-07 DIAGNOSIS — Z950 Presence of cardiac pacemaker: Secondary | ICD-10-CM | POA: Diagnosis not present

## 2018-03-07 DIAGNOSIS — L89310 Pressure ulcer of right buttock, unstageable: Secondary | ICD-10-CM

## 2018-03-07 DIAGNOSIS — I480 Paroxysmal atrial fibrillation: Secondary | ICD-10-CM

## 2018-03-07 DIAGNOSIS — G4733 Obstructive sleep apnea (adult) (pediatric): Secondary | ICD-10-CM | POA: Diagnosis not present

## 2018-03-07 DIAGNOSIS — D46Z Other myelodysplastic syndromes: Secondary | ICD-10-CM

## 2018-03-07 DIAGNOSIS — I5032 Chronic diastolic (congestive) heart failure: Secondary | ICD-10-CM | POA: Diagnosis not present

## 2018-03-07 DIAGNOSIS — R0902 Hypoxemia: Secondary | ICD-10-CM

## 2018-03-07 DIAGNOSIS — F325 Major depressive disorder, single episode, in full remission: Secondary | ICD-10-CM | POA: Diagnosis not present

## 2018-03-07 DIAGNOSIS — G250 Essential tremor: Secondary | ICD-10-CM

## 2018-03-07 DIAGNOSIS — D4620 Refractory anemia with excess of blasts, unspecified: Secondary | ICD-10-CM

## 2018-03-07 NOTE — Progress Notes (Signed)
Location:  Occupational psychologist of Service:  Clinic (12)  Provider: Cire Deyarmin L. [name], D.O., C.M.D.  Code Status: DNR Goals of Care:  Advanced Directives 03/07/2018  Does Patient Have a Medical Advance Directive? Yes  Type of Paramedic of [place];Out of facility DNR (pink MOST or yellow form)  Does patient want to make changes to medical advance directive? No - Patient declined  Copy of [place] in Chart? Yes  Pre-existing out of facility DNR order (yellow form or pink MOST form) Yellow form placed in chart (order not valid for inpatient use)   Chief Complaint  Patient presents with  . Medical Management of Chronic Issues    72mth follow-up    HPI: Patient is a 84 y.o. female seen today for medical management of chronic diseases.    Felt good until she walked down here and her oxygen dropped to 88%.  She was short of breath.  BP up a little when arrived also.  It's been back and forth, she says--126/68 to 148/70stg.    She doesn't sleep too well.  Dreams about doing puzzles. Does have crazy dreams about her mother when her kids were little and her husband back then.  She has a sore on her butt.  It's been off and on since she's been here.  It started in the hospital like a bedsore.  Bad when she sits a lot--it's been rubbing back and forth.  She's been putting desitin. She's been sitting a lot more since being sick with bronchitis in feb--it took a lot out of her.    She's going to a pool activity tomorrow to walk in the pool.  Also has a hair appt and a mindful meditation program in the evening.    No more coughing or wheezing.    Went to neurology and fortunately did not have Parkinson's, but essential tremor with rest and intention tremor.  She was educated that amiodarone could cause tremor.  She also went to Dr. Halford Chessman and advance home care was to come adjust her bipap machine to 12/8 cm H20.      Uses 2 liters at  night with that.    Past Medical History:  Diagnosis Date  . Allergic rhinitis   . Anemia of renal disease 10/21/2011  . Anemia, iron deficiency 10/21/2011  . Cholelithiasis   . Chronic diastolic heart failure ([place])   . Colon polyp   . Constipation   . Cough    2nd to reflux  . Debility, unspecified   . Dehydration   . Depression   . Diaphragm dysfunction    Right hemidiaphragm  . Disorder of bone and cartilage, unspecified   . Diverticulosis of colon   . Dysphagia   . GERD (gastroesophageal reflux disease) 02/08/2013  . History of hyperkalemia in setting of spironolactone 09/2010  . Hyperlipidemia   . Hypertension   . Hypopotassemia   . Hypothyroidism 02/08/2013  . Long term (current) use of anticoagulants   . MDS (myelodysplastic syndrome), low grade ([place]) 10/21/2011  . Obesity   . Obesity hypoventilation syndrome ([place])   . OSA (obstructive sleep apnea)   . Osteoarthrosis, unspecified whether generalized or localized, unspecified site   . Other specified disease of white blood cells   . Pacemaker    Implanted December 2012  . Paroxysmal A-fib (HCC) with RVR   Amiodarone  . Reflux esophagitis   . Rosacea 05/15/2013  . Senile cataract   .  Sinus node dysfunction (HCC)   . Tracheobronchomalacia   . Unspecified vitamin D deficiency   . Urinary incontinence   . Xerostomia     Past Surgical History:  Procedure Laterality Date  . CATARACT EXTRACTION  2008  . PACEMAKER PLACEMENT  10/21/2010   STJ, dural chamber Dr. [name]  . TOTAL KNEE ARTHROPLASTY  1999   right knee  . TOTAL KNEE ARTHROPLASTY  2003   left knee  . TUBAL LIGATION  1964  . Tummy tuck  2000   pt "almost died"; respiratory distress, became delrious    Allergies  Allergen Reactions  . Codeine     Extreme lethargy  . Iron     By infusion  . Morphine   . Toviaz [Fesoterodine Fumarate]     Outpatient Encounter Medications as of 03/07/2018  Medication Sig  . acetaminophen (TYLENOL) 325 MG tablet Take 650  mg by mouth every 4 (four) hours as needed.   Marland Kitchen allopurinol (ZYLOPRIM) 100 MG tablet Take 100 mg by mouth daily.  Marland Kitchen amiodarone (PACERONE) 200 MG tablet Take 200 mg by mouth daily.  Marland Kitchen ezetimibe (ZETIA) 10 MG tablet Take 10 mg by mouth daily.  . fluticasone (FLONASE) 50 MCG/ACT nasal spray Place 2 sprays into the nose 2 (two) times daily. 2 sprays in each nostril  . levothyroxine (SYNTHROID, LEVOTHROID) 75 MCG tablet   . polyethylene glycol powder (MIRALAX) powder Take 17 g by mouth as needed for mild constipation.   . potassium chloride SA (K-DUR,KLOR-CON) 20 MEQ tablet Take 40 mEq by mouth daily.  . pseudoephedrine-guaifenesin (MUCINEX D) 60-600 MG 12 hr tablet Take 1 tablet by mouth as needed for congestion.   . Rivaroxaban (XARELTO) 15 MG TABS tablet Take 1 tablet (15 mg total) by mouth daily with supper.  . sertraline (ZOLOFT) 25 MG tablet Take 25 mg by mouth daily.  Marland Kitchen torsemide (DEMADEX) 20 MG tablet Take 40 mg by mouth daily.  . [DISCONTINUED] Levothyroxine Sodium 75 MCG CAPS Take 1 mcg by mouth daily before breakfast.   No facility-administered encounter medications on file as of 03/07/2018.     Review of Systems:  Review of Systems  Constitutional: Negative for chills and fever.       Disappointed that she gained her weight back that she lost with bronchitis  HENT: Positive for hearing loss. Negative for congestion.   Eyes: Negative for blurred vision.  Respiratory: Positive for shortness of breath. Negative for cough, sputum production and wheezing.   Cardiovascular: Negative for chest pain, palpitations and leg swelling.  Gastrointestinal: Negative for abdominal pain.  Genitourinary: Negative for dysuria.       Some leakage, wears pad  Musculoskeletal: Positive for back pain. Negative for falls and joint pain.  Skin: Negative for itching.       Right buttock small pressure sore  Neurological: Negative for dizziness and loss of consciousness.  Endo/Heme/Allergies: Does not  bruise/bleed easily.  Psychiatric/Behavioral: Negative for depression and memory loss. The patient is not nervous/anxious and does not have insomnia.        Dreams a lot now    Health Maintenance  Topic Date Due  . TETANUS/TDAP  12/30/1950  . INFLUENZA VACCINE  05/03/2018  . DEXA SCAN  Completed  . PNA vac Low Risk Adult  Completed    Physical Exam: Vitals:   03/07/18 1056  BP: (!) 148/78  Pulse: 76  Temp: 97.6 F (36.4 C)  TempSrc: Oral  SpO2: (!) 88%  Weight: 199 lb (  90.3 kg)  Height: 5\' 4"  (1.626 m)   Body mass index is 34.16 kg/m. Physical Exam  Constitutional: She is oriented to person, place, and time. She appears well-developed and well-nourished. No distress.  HENT:  Head: Normocephalic and atraumatic.  Cardiovascular: Normal rate, regular rhythm, normal heart sounds and intact distal pulses.  Pulmonary/Chest: Effort normal and breath sounds normal. No respiratory distress. She has no wheezes. She has no rales.  Abdominal: Bowel sounds are normal. She exhibits no distension. There is no tenderness.  Musculoskeletal: Normal range of motion.  Neurological: She is alert and oriented to person, place, and time.  Resting and intention tremor more notable on left today  Skin: Skin is warm and dry. Capillary refill takes less than 2 seconds.  Psychiatric: She has a normal mood and affect.    Labs reviewed: Basic Metabolic Panel: Recent Labs    09/04/17 0600 11/29/17 1646 12/05/17 0700 03/06/18 0600  NA 144 146* 145 146  K 3.8 4.7 4.9 4.4  CL  --  102  --   --   CO2  --  29  --   --   GLUCOSE  --  137*  --   --   BUN 21 33* 12 22*  CREATININE 1.6* 2.04* 1.4* 1.8*  CALCIUM  --  9.0  --   --   TSH 3.24 0.965  --  3.65   Liver Function Tests: Recent Labs    09/04/17 0600 03/06/18 0600  AST 10* 18  ALT 7 11  ALKPHOS 99 91   No results for input(s): LIPASE, AMYLASE in the last 8760 hours. No results for input(s): AMMONIA in the last 8760  hours. CBC: Recent Labs    09/04/17 0600 03/06/18 0600  WBC 6.6 7.5  HGB 10.9* 11.4*  HCT 34* 35*  PLT 168 178   Lipid Panel: No results for input(s): CHOL, HDL, LDLCALC, TRIG, CHOLHDL, LDLDIRECT in the last 8760 hours. Lab Results  Component Value Date   HGBA1C 5.5 09/06/2016     Assessment/Plan 1. Depression, major, single episode, complete remission ([place]) -resolved, doing much better  2. MDS (myelodysplastic syndrome), low grade (HCC) -anemia slightly worse, but overall labs stable  3. Chronic diastolic heart failure (HCC) -stable, no signs of acute chf, cont same regimen and monitor  4. Paroxysmal atrial fibrillation (HCC) -remains on amiodarone, tfts, lfts all ok, lungs clear  5. Pacemaker -last pacer check wnl, had some pvcs when she had bronchitis, but stable since  6. OSA treated with BiPAP -for new settings of 12/8--Dr. Juanetta Gosling office was going to have [facility] home [facility] come out to adjust her settings, but this hasn't happened yet   7. Benign essential tremor -had neuro eval, no change, also could be affected by amiodarone  8. Hypoxia -with exertion walking from [place] to clinic for example -has had some previously with walks, resolves with rest, deep breaths or wearing her O2 in her apt -no abnormalities on lung exam -has been an issue since her bronchitis in feb/mar -is deconditioned from that and less active since so encouraged her to get out more and be more active  9.  Pressure injury right buttock, unstageable -small scab over previously opened area only 1/2 cm and scar tissue present medially that is hard; erythema of both buttocks medially and where her sanitary pad contacts--she's going to get smaller ones -encouraged more activity and positional changes to avoid pressure on the area, continued desitin to the affected area, keeping dry,  using smaller pads to avoid contact with the area, notify me or nursing if worsens  Labs/tests ordered:  No new--just  had full panel Next appt:  6 mos med mgt in [place]. Sharma Lawrance, D.O. [facility] [address] [place],  [address] Cell Phone (Mon-Fri 8am-5pm):  [phone] On Call:  [phone] & follow prompts after 5pm & weekends Office Phone:  [phone] Office Fax:  [phone]

## 2018-03-08 ENCOUNTER — Encounter: Payer: Self-pay | Admitting: Internal Medicine

## 2018-03-09 ENCOUNTER — Telehealth: Payer: Self-pay | Admitting: Pulmonary Disease

## 2018-03-09 NOTE — Telephone Encounter (Signed)
Spoke with pt regarding rec'd letter from [facility] needing pt to have qualifying walk to keep her O2. Per VS; pt using O2 at night only- should not need O2 during the daytime. Pt advised that she is using O2 during the day more and more, and would like to have the walk. After the walk is completed, please notify Kelli and VS if pt sats dropped below 88%. Scheduled pt in 79min walk slot for Monday 03-12-18 at 10:30am

## 2018-03-12 ENCOUNTER — Telehealth: Payer: Self-pay | Admitting: Pulmonary Disease

## 2018-03-12 ENCOUNTER — Ambulatory Visit (INDEPENDENT_AMBULATORY_CARE_PROVIDER_SITE_OTHER): Payer: Medicare Other | Admitting: *Deleted

## 2018-03-12 DIAGNOSIS — J9611 Chronic respiratory failure with hypoxia: Secondary | ICD-10-CM | POA: Diagnosis not present

## 2018-03-12 NOTE — Telephone Encounter (Signed)
Pt was having trouble breathing during the day; that is why the walk today. Pt is with [facility], they required for pt to be accessed for O2 in office to keep pt with O2. Pt only uses O2 at night time only.  Nothing further needed at this time.

## 2018-03-12 NOTE — Progress Notes (Signed)
Ambulatory Pulse Oximetry   Row Name  03/12/18 1054          Resting  Supplemental oxygen during test?  No          Resting Heart Rate  79          Resting Sp02  98          Lap 1 (185 feet)  HR  109          02 Sat  97          Lap 2 (185 feet)  HR  101          02 Sat  95          Lap 3 (185 feet)  Tech Comments:  Patient was only able to walk 2 laps due to fatigue. Denied any SOB or chest pain.

## 2018-03-12 NOTE — Telephone Encounter (Signed)
She reported having more trouble with shortness of breath.  That is why we wanted to bring her in for ambulatory oximetry.    Will route to [facility] to f/u with patient.

## 2018-03-12 NOTE — Telephone Encounter (Signed)
Ambulatory Pulse Oximetry   Row Name  03/12/18 1054          Resting  Supplemental oxygen during test?  No          Resting Heart Rate  79          Resting Sp02  98          Lap 1 (185 feet)  HR  109          02 Sat  97          Lap 2 (185 feet)  HR  101          02 Sat  95          Lap 3 (185 feet)  Tech Comments:  Patient was only able to walk 2 laps due to fatigue. Denied any SOB or chest pain.                See phone note from 03/09/18. Patient stated that she was advised to come in to do a qualifying walk in order to keep her O2. Per patient, she does NOT use the oxygen during the day, just with the Bipap machine. She was confused on why she needed a walk. Advised patient I would send this note and results over to VS so that he is aware. Patient verbalized understanding.

## 2018-03-21 ENCOUNTER — Non-Acute Institutional Stay: Payer: Medicare Other

## 2018-03-21 DIAGNOSIS — Z0000 Encounter for general adult medical examination without abnormal findings: Secondary | ICD-10-CM | POA: Diagnosis not present

## 2018-03-21 NOTE — Progress Notes (Addendum)
Pink foam for buttocks     Subjective:   Cindy Cervantes is a 84 y.o. female who presents for Medicare Annual (Subsequent) preventive examination at [facility]-03/08/2017    Objective:     Vitals: BP (!) 162/68 (BP Location: Left Arm, Patient Position: Sitting)   Pulse 79   Temp 98 F (36.7 C) (Oral)   Ht 5\' 4"  (1.626 m)   Wt 199 lb (90.3 kg)   SpO2 94%   BMI 34.16 kg/m   Body mass index is 34.16 kg/m.  Provider and nurse on the floor notified of BP  Advanced Directives 03/07/2018 12/13/2017 11/14/2017 09/06/2017 03/08/2017 01/11/2017 09/07/2016  Does Patient Have a Medical Advance Directive? Yes Yes Yes Yes Yes Yes Yes  Type of Paramedic of [place];Out of facility DNR (pink MOST or yellow form) Out of facility DNR (pink MOST or yellow form);Healthcare Power of [place];Out of facility DNR (pink MOST or yellow form) Out of facility DNR (pink MOST or yellow form);Healthcare Power of [place];Out of facility DNR (pink MOST or yellow form) Healthcare Power of [place]  Does patient want to make changes to medical advance directive? No - Patient declined No - Patient declined No - Patient declined No - Patient declined - - -  Copy of [place] in Chart? Yes Yes Yes Yes Yes Yes Yes  Pre-existing out of facility DNR order (yellow form or pink MOST form) Yellow form placed in chart (order not valid for inpatient use) Yellow form placed in chart (order not valid for inpatient use) Yellow form placed in chart (order not valid for inpatient use) Yellow form placed in chart (order not valid for inpatient use) Yellow form placed in chart (order not valid for inpatient use) - -    Tobacco Social History   Tobacco Use  Smoking Status Former Smoker  . Packs/day: 0.50  . Types: Cigarettes  . Last attempt to quit: 10/03/1982  . Years since quitting:  35.4  Smokeless Tobacco Never Used     Counseling given: Not Answered   Clinical Intake:  Pre-visit preparation completed: No  Pain : No/denies pain     Nutritional Risks: None Diabetes: No  How often do you need to have someone help you when you read instructions, pamphlets, or other written materials from your doctor or pharmacy?: 1 - Never What is the last grade level you completed in school?: some college  Interpreter Needed?: No  Information entered by :: Tyson Dense ,RN  Past Medical History:  Diagnosis Date  . Allergic rhinitis   . Anemia of renal disease 10/21/2011  . Anemia, iron deficiency 10/21/2011  . Cholelithiasis   . Chronic diastolic heart failure ([place])   . Colon polyp   . Constipation   . Cough    2nd to reflux  . Debility, unspecified   . Dehydration   . Depression   . Diaphragm dysfunction    Right hemidiaphragm  . Disorder of bone and cartilage, unspecified   . Diverticulosis of colon   . Dysphagia   . GERD (gastroesophageal reflux disease) 02/08/2013  . History of hyperkalemia in setting of spironolactone 09/2010  . Hyperlipidemia   . Hypertension   . Hypopotassemia   . Hypothyroidism 02/08/2013  . Long term (current) use of anticoagulants   . MDS (myelodysplastic syndrome), low grade ([place]) 10/21/2011  . Obesity   . Obesity  hypoventilation syndrome ([place])   . OSA (obstructive sleep apnea)   . Osteoarthrosis, unspecified whether generalized or localized, unspecified site   . Other specified disease of white blood cells   . Pacemaker    Implanted December 2012  . Paroxysmal A-fib (HCC) with RVR   Amiodarone  . Reflux esophagitis   . Rosacea 05/15/2013  . Senile cataract   . Sinus node dysfunction (HCC)   . Tracheobronchomalacia   . Unspecified vitamin D deficiency   . Urinary incontinence   . Xerostomia    Past Surgical History:  Procedure Laterality Date  . CATARACT EXTRACTION  2008  . PACEMAKER PLACEMENT  10/21/2010   STJ, dural  chamber Dr. [name]  . TOTAL KNEE ARTHROPLASTY  1999   right knee  . TOTAL KNEE ARTHROPLASTY  2003   left knee  . TUBAL LIGATION  1964  . Tummy tuck  2000   pt "almost died"; respiratory distress, became delrious   Family History  Problem Relation Age of Onset  . Heart disease Father   . Parkinson's disease Sister   . Heart disease Brother   . Parkinson's disease Brother    Social History   Socioeconomic History  . Marital status: Widowed    Spouse name: Not on file  . Number of children: Not on file  . Years of education: Not on file  . Highest education level: Not on file  Occupational History  . Occupation: retired    Fish farm manager: RETIRED    Comment: Network engineer   Social Needs  . Financial resource strain: Not hard at all  . Food insecurity:    Worry: Never true    Inability: Never true  . Transportation needs:    Medical: No    Non-medical: No  Tobacco Use  . Smoking status: Former Smoker    Packs/day: 0.50    Types: Cigarettes    Last attempt to quit: 10/03/1982    Years since quitting: 35.4  . Smokeless tobacco: Never Used  Substance and Sexual Activity  . Alcohol use: Yes    Comment: very rarely.  none in one year  . Drug use: No  . Sexual activity: Never  Lifestyle  . Physical activity:    Days per week: 0 days    Minutes per session: 0 min  . Stress: Only a little  Relationships  . Social connections:    Talks on phone: Three times a week    Gets together: Three times a week    Attends religious service: Never    Active member of club or organization: No    Attends meetings of clubs or organizations: Never    Relationship status: Widowed  Other Topics Concern  . Not on file  Social History Narrative   Widowed 2002 (married 1955), originally form [place]. 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 Moved from [place], [place] to [place], [place] section 2009. Moved to Assisted Living section  2012.    Non smoker, mininmal alcohol.    Has POA   Power wheelchair, walker   Exercise none                Outpatient Encounter Medications as of 03/21/2018  Medication Sig  . acetaminophen (TYLENOL) 325 MG tablet Take 650 mg by mouth every 4 (four) hours as needed.   Marland Kitchen allopurinol (ZYLOPRIM) 100 MG tablet Take 100 mg by mouth daily.  Marland Kitchen amiodarone (PACERONE) 200 MG tablet Take 200 mg by  mouth daily.  Marland Kitchen ezetimibe (ZETIA) 10 MG tablet Take 10 mg by mouth daily.  . fluticasone (FLONASE) 50 MCG/ACT nasal spray Place 2 sprays into the nose 2 (two) times daily. 2 sprays in each nostril  . levothyroxine (SYNTHROID, LEVOTHROID) 75 MCG tablet   . polyethylene glycol powder (MIRALAX) powder Take 17 g by mouth as needed for mild constipation.   . potassium chloride SA (K-DUR,KLOR-CON) 20 MEQ tablet Take 40 mEq by mouth daily.  . pseudoephedrine-guaifenesin (MUCINEX D) 60-600 MG 12 hr tablet Take 1 tablet by mouth as needed for congestion.   . Rivaroxaban (XARELTO) 15 MG TABS tablet Take 1 tablet (15 mg total) by mouth daily with supper.  . sertraline (ZOLOFT) 25 MG tablet Take 25 mg by mouth daily.  Marland Kitchen torsemide (DEMADEX) 20 MG tablet Take 40 mg by mouth daily.   No facility-administered encounter medications on file as of 03/21/2018.     Activities of Daily Living In your present state of health, do you have any difficulty performing the following activities: 03/21/2018  Hearing? N  Vision? N  Difficulty concentrating or making decisions? Y  Walking or climbing stairs? Y  Dressing or bathing? N  Doing errands, shopping? N  Preparing Food and eating ? N  Using the Toilet? N  In the past six months, have you accidently leaked urine? Y  Do you have problems with loss of bowel control? N  Managing your Medications? Y  Managing your Finances? Y  Housekeeping or managing your Housekeeping? Y  Some recent data might be hidden    Patient Care Team: Gayland Curry, DO as PCP - General  (Geriatric Medicine) Mardene Celeste, NP as Nurse Practitioner (Geriatric Medicine) [facility] Royal, Elisabeth Cara, MD as Consulting Physician (Pulmonary Disease) Larey Dresser, MD as Consulting Physician (Cardiology) Lafayette Dragon, MD (Inactive) as Consulting Physician (Gastroenterology) Volanda Napoleon, MD as Consulting Physician (Oncology) Ninetta Lights, MD as Consulting Physician (Orthopedic Surgery) Sydnee Levans, MD as Consulting Physician (Dermatology) Prentiss Bells, MD as Consulting Physician (Ophthalmology)    Assessment:   This is a routine wellness examination for [place].  Exercise Activities and Dietary recommendations Current Exercise Habits: The patient does not participate in regular exercise at present, Exercise limited by: None identified  Goals    None      Fall Risk Fall Risk  03/21/2018 03/07/2018 01/29/2018 03/08/2017 01/11/2017  Falls in the past year? No No No No No  Comment - - - - -  Number falls in past yr: - - - - -  Injury with Fall? - - - - -   Is the patient's home free of loose throw rugs in walkways, pet beds, electrical cords, etc?   yes      Grab bars in the bathroom? yes      Handrails on the stairs?   yes      Adequate lighting?   yes  Depression Screen PHQ 2/9 Scores 03/21/2018 03/07/2018 03/08/2017 01/11/2017  PHQ - 2 Score 0 0 0 3  PHQ- 9 Score - - - 8     Cognitive Function completed within last year MMSE - Mini Mental State Exam 10/19/2017 10/19/2017 11/02/2016 10/29/2015  Orientation to time 5 5 5 5   Orientation to Place 5 5 5 5   Registration 3 3 3 3   Attention/ Calculation 5 5 5 5   Recall 3 3 3 3   Language- name 2 objects 2 2 2 2   Language- repeat 1  1 1 1   Language- follow 3 step command 3 3 3 3   Language- read & follow direction 1 1 1 1   Write a sentence 1 1 1 1   Copy design 1 1 1 1   Total score 30 30 30 30         Immunization History  Administered Date(s) Administered  . Influenza Inj Mdck Quad Pf  07/21/2016  . Influenza Split 07/08/2011  . Influenza Whole 07/06/2009, 07/03/2012, 07/16/2013  . Influenza-Unspecified 07/08/2014, 07/16/2015, 07/27/2017  . PPD Test 11/02/2011  . Pneumococcal Conjugate-13 01/13/2015  . Pneumococcal Polysaccharide-23 10/27/2010  . Tdap 07/08/2015  . Zoster Recombinat (Shingrix) 10/11/2017, 01/05/2018    Qualifies for Shingles Vaccine? Up to date, completed  Screening Tests Health Maintenance  Topic Date Due  . INFLUENZA VACCINE  05/03/2018  . TETANUS/TDAP  07/07/2025  . DEXA SCAN  Completed  . PNA vac Low Risk Adult  Completed    Cancer Screenings: Lung: Low Dose CT Chest recommended if Age 47-80 years, 30 pack-year currently smoking OR have quit w/in 15years. Patient does not qualify. Breast:  Up to date on Mammogram? Yes   Up to date of Bone Density/Dexa? Yes Colorectal: up to date  Additional Screenings:  Hepatitis C Screening: declined     Plan:    I have personally reviewed and addressed the Medicare Annual Wellness questionnaire and have noted the following in the patient's chart:  A. Medical and social history B. Use of alcohol, tobacco or illicit drugs  C. Current medications and supplements D. Functional ability and status E.  Nutritional status F.  Physical activity G. Advance directives H. List of other physicians I.  Hospitalizations, surgeries, and ER visits in previous 12 months J.  [place] to include hearing, vision, cognitive, depression L. Referrals and appointments - none  In addition, I have reviewed and discussed with patient certain preventive protocols, quality metrics, and best practice recommendations. A written personalized care plan for preventive services as well as general preventive health recommendations were provided to patient.  See attached scanned questionnaire for additional information.   Signed,   Tyson Dense, RN Nurse Health Advisor  Patient Concerns: Bed sore on buttocks

## 2018-03-21 NOTE — Patient Instructions (Addendum)
Cindy Cervantes , Thank you for taking time to come for your Medicare Wellness Visit. I appreciate your ongoing commitment to your health goals. Please review the following plan we discussed and let me know if I can assist you in the future.   Screening recommendations/referrals: Colonoscopy excluded, over age 82 Mammogram excluded, over age 37 Bone Density up to date Recommended yearly ophthalmology/optometry visit for glaucoma screening and checkup Recommended yearly dental visit for hygiene and checkup  Vaccinations: Influenza vaccine up to date, due 2019 fall season Pneumococcal vaccine up to date, completed Tdap vaccine up to date, due 07/2025 Shingles vaccine up to date, completed    Advanced directives: in chart  Conditions/risks identified: none  Next appointment: Dr. [name] 09/12/2018 @ 11am   Preventive Care 56 Years and Older, Female Preventive care refers to lifestyle choices and visits with your health care provider that can promote health and wellness. What does preventive care include?  A yearly physical exam. This is also called an annual well check.  Dental exams once or twice a year.  Routine eye exams. Ask your health care provider how often you should have your eyes checked.  Personal lifestyle choices, including:  Daily care of your teeth and gums.  Regular physical activity.  Eating a healthy diet.  Avoiding tobacco and drug use.  Limiting alcohol use.  Practicing safe sex.  Taking low-dose aspirin every day.  Taking vitamin and mineral supplements as recommended by your health care provider. What happens during an annual well check? The services and screenings done by your health care provider during your annual well check will depend on your age, overall health, lifestyle risk factors, and family history of disease. Counseling  Your health care provider may ask you questions about your:  Alcohol use.  Tobacco use.  Drug use.  Emotional  well-being.  Home and relationship well-being.  Sexual activity.  Eating habits.  History of falls.  Memory and ability to understand (cognition).  Work and work Statistician.  Reproductive health. Screening  You may have the following tests or measurements:  Height, weight, and BMI.  Blood pressure.  Lipid and cholesterol levels. These may be checked every 5 years, or more frequently if you are over 72 years old.  Skin check.  Lung cancer screening. You may have this screening every year starting at age 62 if you have a 30-pack-year history of smoking and currently smoke or have quit within the past 15 years.  Fecal occult blood test (FOBT) of the stool. You may have this test every year starting at age 29.  Flexible sigmoidoscopy or colonoscopy. You may have a sigmoidoscopy every 5 years or a colonoscopy every 10 years starting at age 82.  Hepatitis C blood test.  Hepatitis B blood test.  Sexually transmitted disease (STD) testing.  Diabetes screening. This is done by checking your blood sugar (glucose) after you have not eaten for a while (fasting). You may have this done every 1-3 years.  Bone density scan. This is done to screen for osteoporosis. You may have this done starting at age 52.  Mammogram. This may be done every 1-2 years. Talk to your health care provider about how often you should have regular mammograms. Talk with your health care provider about your test results, treatment options, and if necessary, the need for more tests. Vaccines  Your health care provider may recommend certain vaccines, such as:  Influenza vaccine. This is recommended every year.  Tetanus, diphtheria, and acellular pertussis (  Tdap, Td) vaccine. You may need a Td booster every 10 years.  Zoster vaccine. You may need this after age 13.  Pneumococcal 13-valent conjugate (PCV13) vaccine. One dose is recommended after age 62.  Pneumococcal polysaccharide (PPSV23) vaccine. One  dose is recommended after age 76. Talk to your health care provider about which screenings and vaccines you need and how often you need them. This information is not intended to replace advice given to you by your health care provider. Make sure you discuss any questions you have with your health care provider. Document Released: 10/16/2015 Document Revised: 06/08/2016 Document Reviewed: 07/21/2015 Elsevier Interactive Patient Education  2017 [place] Prevention in the Home Falls can cause injuries. They can happen to people of all ages. There are many things you can do to make your home safe and to help prevent falls. What can I do on the outside of my home?  Regularly fix the edges of walkways and driveways and fix any cracks.  Remove anything that might make you trip as you walk through a door, such as a raised step or threshold.  Trim any bushes or trees on the path to your home.  Use bright outdoor lighting.  Clear any walking paths of anything that might make someone trip, such as rocks or tools.  Regularly check to see if handrails are loose or broken. Make sure that both sides of any steps have handrails.  Any raised decks and porches should have guardrails on the edges.  Have any leaves, snow, or ice cleared regularly.  Use sand or salt on walking paths during winter.  Clean up any spills in your garage right away. This includes oil or grease spills. What can I do in the bathroom?  Use night lights.  Install grab bars by the toilet and in the tub and shower. Do not use towel bars as grab bars.  Use non-skid mats or decals in the tub or shower.  If you need to sit down in the shower, use a plastic, non-slip stool.  Keep the floor dry. Clean up any water that spills on the floor as soon as it happens.  Remove soap buildup in the tub or shower regularly.  Attach bath mats securely with double-sided non-slip rug tape.  Do not have throw rugs and other  things on the floor that can make you trip. What can I do in the bedroom?  Use night lights.  Make sure that you have a light by your bed that is easy to reach.  Do not use any sheets or blankets that are too big for your bed. They should not hang down onto the floor.  Have a firm chair that has side arms. You can use this for support while you get dressed.  Do not have throw rugs and other things on the floor that can make you trip. What can I do in the kitchen?  Clean up any spills right away.  Avoid walking on wet floors.  Keep items that you use a lot in easy-to-reach places.  If you need to reach something above you, use a strong step stool that has a grab bar.  Keep electrical cords out of the way.  Do not use floor polish or wax that makes floors slippery. If you must use wax, use non-skid floor wax.  Do not have throw rugs and other things on the floor that can make you trip. What can I do with my stairs?  Do  not leave any items on the stairs.  Make sure that there are handrails on both sides of the stairs and use them. Fix handrails that are broken or loose. Make sure that handrails are as long as the stairways.  Check any carpeting to make sure that it is firmly attached to the stairs. Fix any carpet that is loose or worn.  Avoid having throw rugs at the top or bottom of the stairs. If you do have throw rugs, attach them to the floor with carpet tape.  Make sure that you have a light switch at the top of the stairs and the bottom of the stairs. If you do not have them, ask someone to add them for you. What else can I do to help prevent falls?  Wear shoes that:  Do not have high heels.  Have rubber bottoms.  Are comfortable and fit you well.  Are closed at the toe. Do not wear sandals.  If you use a stepladder:  Make sure that it is fully opened. Do not climb a closed stepladder.  Make sure that both sides of the stepladder are locked into place.  Ask  someone to hold it for you, if possible.  Clearly mark and make sure that you can see:  Any grab bars or handrails.  First and last steps.  Where the edge of each step is.  Use tools that help you move around (mobility aids) if they are needed. These include:  Canes.  Walkers.  Scooters.  Crutches.  Turn on the lights when you go into a dark area. Replace any light bulbs as soon as they burn out.  Set up your furniture so you have a clear path. Avoid moving your furniture around.  If any of your floors are uneven, fix them.  If there are any pets around you, be aware of where they are.  Review your medicines with your doctor. Some medicines can make you feel dizzy. This can increase your chance of falling. Ask your doctor what other things that you can do to help prevent falls. This information is not intended to replace advice given to you by your health care provider. Make sure you discuss any questions you have with your health care provider. Document Released: 07/16/2009 Document Revised: 02/25/2016 Document Reviewed: 10/24/2014 Elsevier Interactive Patient Education  2017 [employer].

## 2018-04-13 DIAGNOSIS — E785 Hyperlipidemia, unspecified: Secondary | ICD-10-CM | POA: Diagnosis not present

## 2018-05-03 ENCOUNTER — Telehealth: Payer: Self-pay | Admitting: Pulmonary Disease

## 2018-05-03 DIAGNOSIS — G4733 Obstructive sleep apnea (adult) (pediatric): Secondary | ICD-10-CM

## 2018-05-03 NOTE — Telephone Encounter (Addendum)
Spoke with the pt  She states her BIPAP is flashing a msg that says the motor has exceeded it's life expectancy She states that she called [facility] and was told she would get a call back and she still has not  She also told me they never changed her pressure setting from the order we placed back in May 2019  Called and spoke with Corene Cornea regarding all of the above  He states that she may qualify for a new machine and to place order for replacement machine  I have done so and pt made aware  I asked her to call us back if she does not hear from someone soon

## 2018-05-07 ENCOUNTER — Telehealth: Payer: Self-pay | Admitting: Pulmonary Disease

## 2018-05-07 DIAGNOSIS — G4733 Obstructive sleep apnea (adult) (pediatric): Secondary | ICD-10-CM

## 2018-05-07 NOTE — Telephone Encounter (Signed)
Called and spoke with pt regarding replacement BIPAP machine from last week. Pt states that BIPAP machine broke and stop working 05/03/18 Called spoke with Vicente Males with [facility] [phone]; pt should have new BIPAP machine this week. Pt verbalized understanding, had nothing further needed.

## 2018-05-10 ENCOUNTER — Ambulatory Visit (INDEPENDENT_AMBULATORY_CARE_PROVIDER_SITE_OTHER): Payer: Medicare Other | Admitting: *Deleted

## 2018-05-10 DIAGNOSIS — I495 Sick sinus syndrome: Secondary | ICD-10-CM | POA: Diagnosis not present

## 2018-05-11 NOTE — Progress Notes (Signed)
Remote pacemaker transmission.   

## 2018-05-29 DIAGNOSIS — R102 Pelvic and perineal pain: Secondary | ICD-10-CM | POA: Diagnosis not present

## 2018-05-29 DIAGNOSIS — L89312 Pressure ulcer of right buttock, stage 2: Secondary | ICD-10-CM | POA: Diagnosis not present

## 2018-05-29 DIAGNOSIS — L89322 Pressure ulcer of left buttock, stage 2: Secondary | ICD-10-CM | POA: Diagnosis not present

## 2018-05-29 DIAGNOSIS — R21 Rash and other nonspecific skin eruption: Secondary | ICD-10-CM | POA: Diagnosis not present

## 2018-05-30 DIAGNOSIS — R21 Rash and other nonspecific skin eruption: Secondary | ICD-10-CM | POA: Diagnosis not present

## 2018-05-30 DIAGNOSIS — R102 Pelvic and perineal pain: Secondary | ICD-10-CM | POA: Diagnosis not present

## 2018-05-30 DIAGNOSIS — L89322 Pressure ulcer of left buttock, stage 2: Secondary | ICD-10-CM | POA: Diagnosis not present

## 2018-05-30 DIAGNOSIS — L89312 Pressure ulcer of right buttock, stage 2: Secondary | ICD-10-CM | POA: Diagnosis not present

## 2018-06-06 DIAGNOSIS — L89312 Pressure ulcer of right buttock, stage 2: Secondary | ICD-10-CM | POA: Diagnosis not present

## 2018-06-06 DIAGNOSIS — R21 Rash and other nonspecific skin eruption: Secondary | ICD-10-CM | POA: Diagnosis not present

## 2018-06-06 DIAGNOSIS — R102 Pelvic and perineal pain: Secondary | ICD-10-CM | POA: Diagnosis not present

## 2018-06-06 DIAGNOSIS — L89322 Pressure ulcer of left buttock, stage 2: Secondary | ICD-10-CM | POA: Diagnosis not present

## 2018-06-07 LAB — CUP PACEART REMOTE DEVICE CHECK
Battery Voltage: 2.87 V
Brady Statistic AP VP Percent: 1.5 %
Brady Statistic AS VS Percent: 1.7 %
Date Time Interrogation Session: 20190808060008
Implantable Lead Implant Date: 20120119
Implantable Lead Location: 753860
Implantable Lead Model: 1948
Lead Channel Impedance Value: 850 Ohm
Lead Channel Pacing Threshold Amplitude: 0.5 V
Lead Channel Pacing Threshold Amplitude: 0.75 V
Lead Channel Pacing Threshold Pulse Width: 0.5 ms
Lead Channel Setting Pacing Amplitude: 1.5 V
Lead Channel Setting Pacing Amplitude: 2.5 V
Lead Channel Setting Pacing Pulse Width: 0.5 ms
MDC IDC LEAD IMPLANT DT: 20120119
MDC IDC LEAD LOCATION: 753859
MDC IDC MSMT BATTERY REMAINING LONGEVITY: 61 mo
MDC IDC MSMT BATTERY REMAINING PERCENTAGE: 51 %
MDC IDC MSMT LEADCHNL RA IMPEDANCE VALUE: 410 Ohm
MDC IDC MSMT LEADCHNL RA PACING THRESHOLD PULSEWIDTH: 0.5 ms
MDC IDC MSMT LEADCHNL RA SENSING INTR AMPL: 1.8 mV
MDC IDC MSMT LEADCHNL RV SENSING INTR AMPL: 8.8 mV
MDC IDC PG IMPLANT DT: 20120119
MDC IDC SET LEADCHNL RV SENSING SENSITIVITY: 2 mV
MDC IDC STAT BRADY AP VS PERCENT: 97 %
MDC IDC STAT BRADY AS VP PERCENT: 1 %
MDC IDC STAT BRADY RA PERCENT PACED: 98 %
MDC IDC STAT BRADY RV PERCENT PACED: 1.6 %
Pulse Gen Model: 2210
Pulse Gen Serial Number: 7198677

## 2018-06-27 ENCOUNTER — Telehealth: Payer: Self-pay | Admitting: Pulmonary Disease

## 2018-06-27 NOTE — Telephone Encounter (Signed)
Pt states she received a letter from [facility], stating that additional information was needing from our office.  Called and and spoke to [employer] with [facility].  Crystal states that CMN was received on 06/20/18, and nothing further is needed from our office at this time.  I have made pt aware of this information.  Will close encounter, as nothing further is needed.

## 2018-07-04 DIAGNOSIS — H5203 Hypermetropia, bilateral: Secondary | ICD-10-CM | POA: Diagnosis not present

## 2018-07-04 DIAGNOSIS — H524 Presbyopia: Secondary | ICD-10-CM | POA: Diagnosis not present

## 2018-07-04 DIAGNOSIS — H1045 Other chronic allergic conjunctivitis: Secondary | ICD-10-CM | POA: Diagnosis not present

## 2018-07-17 ENCOUNTER — Encounter: Payer: Medicare Other | Admitting: Internal Medicine

## 2018-07-18 ENCOUNTER — Ambulatory Visit (INDEPENDENT_AMBULATORY_CARE_PROVIDER_SITE_OTHER): Payer: Medicare Other | Admitting: Internal Medicine

## 2018-07-18 VITALS — BP 124/72 | HR 80 | Ht 64.0 in | Wt 196.0 lb

## 2018-07-18 DIAGNOSIS — Z79899 Other long term (current) drug therapy: Secondary | ICD-10-CM

## 2018-07-18 DIAGNOSIS — I480 Paroxysmal atrial fibrillation: Secondary | ICD-10-CM | POA: Diagnosis not present

## 2018-07-18 DIAGNOSIS — Z950 Presence of cardiac pacemaker: Secondary | ICD-10-CM | POA: Diagnosis not present

## 2018-07-18 NOTE — Patient Instructions (Addendum)
Medication Instructions:  Your physician has recommended you make the following change in your medication:   1. Stop Amiodarone  Labwork: None ordered.  Testing/Procedures: None ordered.  Follow-Up: Your physician recommends that you schedule a follow-up appointment in:   3-4 months with Chanetta Marshall  Remote monitoring is used to monitor your Pacemaker of ICD from home. This monitoring reduces the number of office visits required to check your device to one time per year. It allows [place] to keep an eye on the functioning of your device to ensure it is working properly. You are scheduled for a device check from home on 11/7. You may send your transmission at any time that day. If you have a wireless device, the transmission will be sent automatically. After your physician reviews your transmission, you will receive a postcard with your next transmission date.    Any Other Special Instructions Will Be Listed Below (If Applicable).     If you need a refill on your cardiac medications before your next appointment, please call your pharmacy.

## 2018-07-18 NOTE — Progress Notes (Signed)
Maybe as long as calling [place]    Cardiology Office Note Date:  07/18/2018  Patient ID:  Cindy, Cervantes 07-27-1932, MRN [id number] PCP:  Gayland Curry, DO  Electrophysiology: Dr. [name] [facility] Chesley Mires, MD as Consulting Physician (Pulmonary Disease) Cindy Napoleon, MD as Consulting Physician (Oncology) Trixie Rude., MD as Consulting Physician (Ophthalmology)   Chief Complaint:  Routine EP, pacer visit  History of Present Illness: Cindy Cervantes is a 84 y.o. female  Seen in followup for her paroxysmal atrial fibrillation with a rapid ventricular response and sinus node dysfunction for which she required pacemaker implantation in the fall of 2011.   T    Date Cr K TSH LFTs Hgb  6/17    3.25 6    12/18 1.6 3.8 3.24 4 10.9  6/19 1.8 4.4 3.65     She complains of exercise intolerance.  There is moderate shortness of breath but no chest discomfort.  No peripheral edema.  This is accompanied by both physical and emotional lassitude.  She does take an antidepressant but does not think it works.  Patient denies symptoms of GI intolerance, sun sensitivity,   Surveillance laboratories were in normal limits when checked 4 months  She has significant tremors that has prompted the need to stop sewing.  Neuro sthought not Parkinson  No bleeding      Past Medical History:  Diagnosis Date  . Allergic rhinitis   . Anemia of renal disease 10/21/2011  . Anemia, iron deficiency 10/21/2011  . Cholelithiasis   . Chronic diastolic heart failure ([place])   . Colon polyp   . Constipation   . Cough    2nd to reflux  . Debility, unspecified   . Dehydration   . Depression   . Diaphragm dysfunction    Right hemidiaphragm  . Disorder of bone and cartilage, unspecified   . Diverticulosis of colon   . Dysphagia   . GERD (gastroesophageal reflux disease) 02/08/2013  . History of hyperkalemia in setting of spironolactone 09/2010  . Hyperlipidemia   .  Hypertension   . Hypopotassemia   . Hypothyroidism 02/08/2013  . Long term (current) use of anticoagulants   . MDS (myelodysplastic syndrome), low grade ([place]) 10/21/2011  . Obesity   . Obesity hypoventilation syndrome ([place])   . OSA (obstructive sleep apnea)   . Osteoarthrosis, unspecified whether generalized or localized, unspecified site   . Other specified disease of white blood cells   . Pacemaker    Implanted December 2012  . Paroxysmal A-fib (HCC) with RVR   Amiodarone  . Reflux esophagitis   . Rosacea 05/15/2013  . Senile cataract   . Sinus node dysfunction (HCC)   . Tracheobronchomalacia   . Unspecified vitamin D deficiency   . Urinary incontinence   . Xerostomia     Past Surgical History:  Procedure Laterality Date  . CATARACT EXTRACTION  2008  . PACEMAKER PLACEMENT  10/21/2010   STJ, dural chamber Dr. [name]  . TOTAL KNEE ARTHROPLASTY  1999   right knee  . TOTAL KNEE ARTHROPLASTY  2003   left knee  . TUBAL LIGATION  1964  . Tummy tuck  2000   pt "almost died"; respiratory distress, became delrious    Current Outpatient Medications  Medication Sig Dispense Refill  . acetaminophen (TYLENOL) 325 MG tablet Take 650 mg by mouth every 4 (four) hours as needed.     Marland Kitchen allopurinol (ZYLOPRIM) 100 MG tablet Take  100 mg by mouth daily.    Marland Kitchen amiodarone (PACERONE) 200 MG tablet Take 200 mg by mouth daily.    Marland Kitchen ezetimibe (ZETIA) 10 MG tablet Take 10 mg by mouth daily.    . fluticasone (FLONASE) 50 MCG/ACT nasal spray Place 2 sprays into the nose 2 (two) times daily. 2 sprays in each nostril    . levothyroxine (SYNTHROID, LEVOTHROID) 75 MCG tablet     . NYAMYC powder as directed.    . polyethylene glycol powder (MIRALAX) powder Take 17 g by mouth as needed for mild constipation.     . potassium chloride SA (K-DUR,KLOR-CON) 20 MEQ tablet Take 40 mEq by mouth daily.    . pseudoephedrine-guaifenesin (MUCINEX D) 60-600 MG 12 hr tablet Take 1 tablet by mouth as needed for congestion.      . Rivaroxaban (XARELTO) 15 MG TABS tablet Take 1 tablet (15 mg total) by mouth daily with supper. 30 tablet   . sertraline (ZOLOFT) 25 MG tablet Take 25 mg by mouth daily.    Marland Kitchen torsemide (DEMADEX) 20 MG tablet Take 40 mg by mouth daily.     No current facility-administered medications for this visit.     Allergies:   Codeine; Iron; Morphine; and Toviaz [fesoterodine fumarate]   Social History:  The patient  reports that she quit smoking about 35 years ago. Her smoking use included cigarettes. She smoked 0.50 packs per day. She has never used smokeless tobacco. She reports that she drinks alcohol. She reports that she does not use drugs.   Family History:  The patient's family history includes Heart disease in her brother and father; Parkinson's disease in her brother and sister.  ROS:  Please see the history of present illness.  All other systems are reviewed and otherwise negative.   PHYSICAL EXAM:  VS:  BP 124/72   Pulse 80   Ht 5\' 4"  (1.626 m)   Wt 196 lb (88.9 kg)   BMI 33.64 kg/m  BMI: Body mass index is 33.64 kg/m. Well developed and nourished in no acute distress HENT normal Neck supple with JVP-flat Clear Regular rate and rhythm, no murmurs or gallops Abd-soft with active BS No Clubbing cyanosis edema Skin-warm and dry A & Oriented  Grossly normal sensory and motor function some tremor     Recent Labs: 12//6/16: BUN 27, Creat 1.77, K+ 3.4 , H/H 11/34, plts 188, TSH 2.261, AST 10, ALT 7, LDL 120, HDL 54, Trigs 96 11/29/2017: NT-Pro BNP 492 03/06/2018: ALT 11; BUN 22; Creatinine 1.8; Hemoglobin 11.4; Platelets 178; Potassium 4.4; Sodium 146; TSH 3.65  No results found for requested labs within last 8760 hours.   CrCl cannot be calculated (Patient's most recent lab result is older than the maximum 21 days allowed.).   Wt Readings from Last 3 Encounters:  07/18/18 196 lb (88.9 kg)  03/21/18 199 lb (90.3 kg)  03/07/18 199 lb (90.3 kg)     Other studies  reviewed: Additional studies/records reviewed today include: summarized above  DEVICE information: STJ PPM, implanted 10/21/10, Dr. [name]  ASSESSMENT AND PLAN:  Pacemaker-[employer] Jude The patient's device was interrogated.  The information was reviewed. No changes were made in the programming.    Atrial fibrillation-paroxysmal  HFpEF  Treated hypothyroidism  PVCs-frequent  Tremor   Infrequent atrial fibrillation  No bleeding    PVC burden is hard to measure but there is no significant bump in her ventricular rate histograms to suggest a heavy burden  With her tremor we  will discontinue her amiodarone.  We will plan to review this in about 3 months looking at both burden of atrial fibrillation as well as ventricular response which in the past is been increased.  She may well need augmented rate control.  We discussed the possibility also of AV junction ablation.  We spent more than 50% of our >25 min visit in face to face counseling regarding the above    Virl Axe  07/18/2018 11:58 AM     [place] [address] [phone] (office)  [phone] (fax)

## 2018-07-20 DIAGNOSIS — D225 Melanocytic nevi of trunk: Secondary | ICD-10-CM | POA: Diagnosis not present

## 2018-07-20 DIAGNOSIS — L821 Other seborrheic keratosis: Secondary | ICD-10-CM | POA: Diagnosis not present

## 2018-07-20 DIAGNOSIS — L858 Other specified epidermal thickening: Secondary | ICD-10-CM | POA: Diagnosis not present

## 2018-07-20 DIAGNOSIS — Z85828 Personal history of other malignant neoplasm of skin: Secondary | ICD-10-CM | POA: Diagnosis not present

## 2018-07-20 DIAGNOSIS — D485 Neoplasm of uncertain behavior of skin: Secondary | ICD-10-CM | POA: Diagnosis not present

## 2018-07-20 DIAGNOSIS — D1801 Hemangioma of skin and subcutaneous tissue: Secondary | ICD-10-CM | POA: Diagnosis not present

## 2018-07-20 DIAGNOSIS — I788 Other diseases of capillaries: Secondary | ICD-10-CM | POA: Diagnosis not present

## 2018-07-20 DIAGNOSIS — C44319 Basal cell carcinoma of skin of other parts of face: Secondary | ICD-10-CM | POA: Diagnosis not present

## 2018-07-20 DIAGNOSIS — L853 Xerosis cutis: Secondary | ICD-10-CM | POA: Diagnosis not present

## 2018-07-30 NOTE — Progress Notes (Signed)
Cindy Cervantes was seen today in the movement disorders clinic for neurologic consultation at the request of [name], [name], DO.  The consultation is for the evaluation of possible PD.  "Dr. Mariea Cervantes says I have essential tremor but I want to know if I have parkinsons."  This patient is accompanied in the office by her daughter who supplements the history.    Specific Symptoms:  Tremor: Yes.  , started 20 years ago.  Only in the right hand.  Notes it most with eating, holding onto ipad, writing.   Family hx of similar:  Yes.  , mother with significant tremor.  Brother with PD in his 17's; sister with PD  Voice: no change per patient; daughter states it is more "raspy" Sleep: some trouble getting to sleep  Vivid Dreams:  Yes.   - "they are crazy"  Acting out dreams:  No. - "tethered with CPAP" Wet Pillows: No. Postural symptoms:  Yes.  , it was good until 6 months ago but used walker for 7 years due to deconditioning with a-fib and used to be on O2 and was heavy to carry  Falls?  No. Bradykinesia symptoms: difficulty getting out of a chair; denies stride length change; shuffles some per daughter Loss of smell:  No. and "I smell things that aren't there".  Occasionally smells cigarette smoke when not there and it has been going on for years.  Her daughter wonders if the smell isn't real and she doesn't realize it. Loss of taste:  No. but "nothing tastes good" Urinary Incontinence:  Yes.   (has urge incontinence and "I dribble a lot".  Some urinary urgency as well) Difficulty Swallowing:  No. Handwriting, micrographia: No., "its gotten bigger" Trouble with ADL's:  No.  Trouble buttoning clothing: No. Depression:  Intermittently "a little" depressed Memory changes:  Yes.  , trouble with words/names.  Lives at [facility] x 10 years.  They do her medications.  They also cook for her. Hallucinations:  No.  visual distortions: No. N/V:  No. Lightheaded:  Yes.  , occasionally  Syncope:  No. Diplopia:  No. Dyskinesia:  No.  States that feet are "happy feet."  She describes it as "maybe restless leg."  Daughter states that she has always moved her feet around.  If she gets up and walks it goes away.  Notes it a lot at night with R leg in bed.  Feels like skin is swollen but it is not, esp at night.  There is a burning with it.  Neuroimaging of the brain has previously been performed.  It is available for my review today.  CT brain done in 09/2016 done for smelling cigarette smoke.  There was atrophy in the frontal and temporal regions.  07/31/18 update:  Pt seen in f/u for tremor.  The records that were made available to me were reviewed.  She saw Dr. [name] on 07/18/18 and he d/c her amiodarone due to tremor.  She had to quit sewing due to tremor.  States that tremor had gotten much worse when amiodarone had been increased.  Now that amiodarone is gone, she states that tremor has already improved but "I feel weak."  Complains of knee pain.  Complains of restlessness in her feet at night.   ALLERGIES:   Allergies  Allergen Reactions  . Codeine     Extreme lethargy  . Iron     By infusion  . Morphine   . Toviaz [Fesoterodine Fumarate]  CURRENT MEDICATIONS:  Outpatient Encounter Medications as of 07/31/2018  Medication Sig  . acetaminophen (TYLENOL) 325 MG tablet Take 650 mg by mouth every 4 (four) hours as needed.   Marland Kitchen allopurinol (ZYLOPRIM) 100 MG tablet Take 100 mg by mouth daily.  Marland Kitchen ezetimibe (ZETIA) 10 MG tablet Take 10 mg by mouth daily.  . fluticasone (FLONASE) 50 MCG/ACT nasal spray Place 2 sprays into the nose 2 (two) times daily. 2 sprays in each nostril  . levothyroxine (SYNTHROID, LEVOTHROID) 75 MCG tablet   . polyethylene glycol powder (MIRALAX) powder Take 17 g by mouth as needed for mild constipation.   . potassium chloride SA (K-DUR,KLOR-CON) 20 MEQ tablet Take 40 mEq by mouth daily.  . pseudoephedrine-guaifenesin (MUCINEX D) 60-600 MG 12 hr tablet Take 1  tablet by mouth as needed for congestion.   . Rivaroxaban (XARELTO) 15 MG TABS tablet Take 1 tablet (15 mg total) by mouth daily with supper.  . sertraline (ZOLOFT) 25 MG tablet Take 25 mg by mouth daily.  Marland Kitchen torsemide (DEMADEX) 20 MG tablet Take 40 mg by mouth daily.  . [DISCONTINUED] NYAMYC powder as directed.   No facility-administered encounter medications on file as of 07/31/2018.     PAST MEDICAL HISTORY:   Past Medical History:  Diagnosis Date  . Allergic rhinitis   . Anemia of renal disease 10/21/2011  . Anemia, iron deficiency 10/21/2011  . Cholelithiasis   . Chronic diastolic heart failure ([place])   . Colon polyp   . Constipation   . Cough    2nd to reflux  . Debility, unspecified   . Dehydration   . Depression   . Diaphragm dysfunction    Right hemidiaphragm  . Disorder of bone and cartilage, unspecified   . Diverticulosis of colon   . Dysphagia   . GERD (gastroesophageal reflux disease) 02/08/2013  . History of hyperkalemia in setting of spironolactone 09/2010  . Hyperlipidemia   . Hypertension   . Hypopotassemia   . Hypothyroidism 02/08/2013  . Long term (current) use of anticoagulants   . MDS (myelodysplastic syndrome), low grade ([place]) 10/21/2011  . Obesity   . Obesity hypoventilation syndrome ([place])   . OSA (obstructive sleep apnea)   . Osteoarthrosis, unspecified whether generalized or localized, unspecified site   . Other specified disease of white blood cells   . Pacemaker    Implanted December 2012  . Paroxysmal A-fib (HCC) with RVR   Amiodarone  . Reflux esophagitis   . Rosacea 05/15/2013  . Senile cataract   . Sinus node dysfunction ([facility]CC)   . Tracheobronchomalacia   . Unspecified vitamin D deficiency   . Urinary incontinence   . Xerostomia     PAST SURGICAL HISTORY:   Past Surgical History:  Procedure Laterality Date  . CATARACT EXTRACTION  2008  . PACEMAKER PLACEMENT  10/21/2010   STJ, dural chamber Dr. [name]  . TOTAL KNEE ARTHROPLASTY  1999    right knee  . TOTAL KNEE ARTHROPLASTY  2003   left knee  . TUBAL LIGATION  1964  . Tummy tuck  2000   pt "almost died"; respiratory distress, became delrious    SOCIAL HISTORY:   Social History   Socioeconomic History  . Marital status: Widowed    Spouse name: Not on file  . Number of children: Not on file  . Years of education: Not on file  . Highest education level: Not on file  Occupational History  . 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 Financial resource strain: Not hard at all  . Food insecurity:    Worry: Never true    Inability: Never true  . Transportation needs:    Medical: No    Non-medical: No  Tobacco Use  . Smoking status: Former Smoker    Packs/day: 0.50    Types: Cigarettes    Last attempt to quit: 10/03/1982    Years since quitting: 35.8  . Smokeless tobacco: Never Used  Substance and Sexual Activity  . Alcohol use: Yes    Comment: very rarely.  none in one year  . Drug use: No  . Sexual activity: Never  Lifestyle  . Physical activity:    Days per week: 0 days    Minutes per session: 0 min  . Stress: Only a little  Relationships  . Social connections:    Talks on phone: Three times a week    Gets together: Three times a week    Attends religious service: Never    Active member of club or organization: No    Attends meetings of clubs or organizations: Never    Relationship status: Widowed  . Intimate partner violence:    Fear of current or ex partner: No    Emotionally abused: No    Physically abused: No    Forced sexual activity: No  Other Topics Concern  . Not on file  Social History Narrative   Widowed 2002 (married 1955), originally form [place]. 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 Moved from [place], [place] to [place], [place] section 2009. Moved to [place] section 2012.    Non smoker, mininmal alcohol.    Has  POA   Power wheelchair, walker   Exercise none                FAMILY HISTORY:   Family Status  Relation Name Status  . Father 4 Deceased  . Mother 68 Deceased  . Sister [employer]  . Brother Norris Deceased  . Daughter Burnett Sheng  . Son [employer]  . Sister Pleas Koch Alive       spinal stenosis  . Son Lennart Pall  . Son Glendell Docker Alive    ROS:  Review of Systems  Constitutional: Positive for malaise/fatigue.  HENT: Negative.   Eyes: Negative.   Genitourinary: Negative.   Musculoskeletal: Positive for joint pain (bilateral knees, Cervantes ankle).  Neurological: Positive for weakness.     PHYSICAL EXAMINATION:    VITALS:   Vitals:   07/31/18 1022  BP: 106/62  Pulse: 70  SpO2: 92%  Weight: 204 lb (92.5 kg)  Height: 5\' 4"  (1.626 m)    GEN:  The patient appears stated age and is in NAD. HEENT:  Normocephalic, atraumatic.  The mucous membranes are moist. The superficial temporal arteries are without ropiness or tenderness. CV:  RRR Lungs:  CTAB Neck/HEME:  There are no carotid bruits bilaterally.  Neurological examination:  Orientation: The patient is alert and oriented x3. Cranial nerves: There is good facial symmetry. The speech is fluent and clear. Soft palate rises symmetrically and there is no tongue deviation. Hearing is intact to conversational tone. Sensation: Sensation is intact to light touch throughout Motor: Strength is 5/5 in the bilateral upper and lower extremities.   Shoulder shrug is equal and symmetric.  There is no pronator drift.  Movement examination: Tone: There is normal tone in the upper and lower extremity's.  Abnormal movements: There is no rest tremor.  There is very little postural tremor.  She really has no significant difficulty with Archimedes spirals.  Slight tremor is noticed when she pours water from one glass to another but spills very little. Coordination:  There is no decremation with RAM's with hand opening and closing or finger  taps bilaterally. Gait and Station: The patient pushes off of the chair to arise.  She uses her walker and walks well with that.   Lab Results  Component Value Date   TSH 3.65 03/06/2018     Chemistry      Component Value Date/Time   NA 146 03/06/2018 0600   K 4.4 03/06/2018 0600   CL 102 11/29/2017 1646   CO2 29 11/29/2017 1646   BUN 22 (A) 03/06/2018 0600   CREATININE 1.8 (A) 03/06/2018 0600   CREATININE 2.04 (H) 11/29/2017 1646   GLU 98 03/06/2018 0600      Component Value Date/Time   CALCIUM 9.0 11/29/2017 1646   ALKPHOS 91 03/06/2018 0600   AST 18 03/06/2018 0600   ALT 11 03/06/2018 0600   BILITOT 0.4 11/11/2016 1304       ASSESSMENT/PLAN:  1.  Tremor  -I think that she likely has long term essential tremor that likely was increased by amiodarone.  Her amiodarone was stopped just 2 weeks ago.    It may take a few months (up to 6) to know if tremor will drop at all but she thinks it already has.  She may be right, as she had very little tremor today.  She wants no medication.  If she wants medication in future, primidone could be an option, but her Xarelto would have to be changed to something like Pradaxa as primidone interacts with Xarelto.  Right now, she is very happy with where she is in terms of tremor.  I encouraged her to get back to sewing.  She will follow-up with me on an as-needed basis.    Cc:  [name], [name], DO

## 2018-07-31 ENCOUNTER — Ambulatory Visit (INDEPENDENT_AMBULATORY_CARE_PROVIDER_SITE_OTHER): Payer: Medicare Other | Admitting: Neurology

## 2018-07-31 ENCOUNTER — Encounter: Payer: Self-pay | Admitting: Neurology

## 2018-07-31 VITALS — BP 106/62 | HR 70 | Ht 64.0 in | Wt 204.0 lb

## 2018-07-31 DIAGNOSIS — G250 Essential tremor: Secondary | ICD-10-CM | POA: Diagnosis not present

## 2018-08-02 DIAGNOSIS — Z23 Encounter for immunization: Secondary | ICD-10-CM | POA: Diagnosis not present

## 2018-08-07 DIAGNOSIS — M25572 Pain in left ankle and joints of left foot: Secondary | ICD-10-CM | POA: Diagnosis not present

## 2018-08-07 DIAGNOSIS — M25562 Pain in left knee: Secondary | ICD-10-CM | POA: Diagnosis not present

## 2018-08-09 ENCOUNTER — Ambulatory Visit (INDEPENDENT_AMBULATORY_CARE_PROVIDER_SITE_OTHER): Payer: Medicare Other | Admitting: *Deleted

## 2018-08-09 DIAGNOSIS — I495 Sick sinus syndrome: Secondary | ICD-10-CM

## 2018-08-09 NOTE — Progress Notes (Signed)
Remote pacemaker transmission.   

## 2018-08-12 ENCOUNTER — Encounter: Payer: Self-pay | Admitting: Cardiology

## 2018-08-20 DIAGNOSIS — M25572 Pain in left ankle and joints of left foot: Secondary | ICD-10-CM | POA: Diagnosis not present

## 2018-08-20 DIAGNOSIS — M25562 Pain in left knee: Secondary | ICD-10-CM | POA: Diagnosis not present

## 2018-09-12 ENCOUNTER — Non-Acute Institutional Stay: Payer: Medicare Other | Admitting: Internal Medicine

## 2018-09-12 ENCOUNTER — Encounter: Payer: Self-pay | Admitting: Internal Medicine

## 2018-09-12 VITALS — BP 140/80 | HR 78 | Temp 99.6°F | Ht 64.0 in | Wt 202.0 lb

## 2018-09-12 DIAGNOSIS — I4891 Unspecified atrial fibrillation: Secondary | ICD-10-CM | POA: Diagnosis not present

## 2018-09-12 DIAGNOSIS — G4733 Obstructive sleep apnea (adult) (pediatric): Secondary | ICD-10-CM | POA: Diagnosis not present

## 2018-09-12 DIAGNOSIS — D6869 Other thrombophilia: Secondary | ICD-10-CM | POA: Diagnosis not present

## 2018-09-12 DIAGNOSIS — S8012XA Contusion of left lower leg, initial encounter: Secondary | ICD-10-CM

## 2018-09-12 DIAGNOSIS — D4620 Refractory anemia with excess of blasts, unspecified: Secondary | ICD-10-CM | POA: Diagnosis not present

## 2018-09-12 DIAGNOSIS — I480 Paroxysmal atrial fibrillation: Secondary | ICD-10-CM

## 2018-09-12 DIAGNOSIS — G250 Essential tremor: Secondary | ICD-10-CM | POA: Diagnosis not present

## 2018-09-12 DIAGNOSIS — I5032 Chronic diastolic (congestive) heart failure: Secondary | ICD-10-CM

## 2018-09-12 DIAGNOSIS — D46Z Other myelodysplastic syndromes: Secondary | ICD-10-CM

## 2018-09-12 NOTE — Progress Notes (Signed)
Location:  Diplomatic Services operational officer of Service:  Clinic (12) clinic Provider: Pattrick Bady L. [name], D.O., C.M.D.  Code Status: DNR Goals of Care:  Advanced Directives 03/07/2018  Does Patient Have a Medical Advance Directive? Yes  Type of Paramedic of [place];Out of facility DNR (pink MOST or yellow form)  Does patient want to make changes to medical advance directive? No - Patient declined  Copy of [place] in Chart? Yes  Pre-existing out of facility DNR order (yellow form or pink MOST form) Yellow form placed in chart (order not valid for inpatient use)   Chief Complaint  Patient presents with  . Medical Management of Chronic Issues    29mth follow-up    HPI: Patient is a 84 y.o. female seen today for medical management of chronic diseases.    [place] and hurt left leg on 11/1. She was folding up her cover on her recliner and got her feet tangled up.  She did not realize she'd hurt herself.  She crawled to hit her call bell.   An xray was done a few days later.  The left lower leg was very swollen for quite a while.  It's still sore.  She did get checked by ortho and no fx.  Was put on abx in case.    The sore area on her buttocks is back. The abx made it worse.    She says the leg hurts from knee highs she wore to bridge yesterday.  A month before she made her lower leg sore when she was putting clothes away, she turned the leg wrong.    She's feeling very unsteady.  Discussed idea of therapy to help her. Says she's weak and shaky when she walks.    Tremor is better off amiodarone but is still there some.  She had quit sewing due to difficuty with the pedal.  Does spill some soup on the way.    No further gout trouble.    Past Medical History:  Diagnosis Date  . Allergic rhinitis   . Anemia of renal disease 10/21/2011  . Anemia, iron deficiency 10/21/2011  . Cholelithiasis   . Chronic diastolic heart failure ([place])   .  Colon polyp   . Constipation   . Cough    2nd to reflux  . Debility, unspecified   . Dehydration   . Depression   . Diaphragm dysfunction    Right hemidiaphragm  . Disorder of bone and cartilage, unspecified   . Diverticulosis of colon   . Dysphagia   . GERD (gastroesophageal reflux disease) 02/08/2013  . History of hyperkalemia in setting of spironolactone 09/2010  . Hyperlipidemia   . Hypertension   . Hypopotassemia   . Hypothyroidism 02/08/2013  . Long term (current) use of anticoagulants   . MDS (myelodysplastic syndrome), low grade ([place]) 10/21/2011  . Obesity   . Obesity hypoventilation syndrome ([place])   . OSA (obstructive sleep apnea)   . Osteoarthrosis, unspecified whether generalized or localized, unspecified site   . Other specified disease of white blood cells   . Pacemaker    Implanted December 2012  . Paroxysmal A-fib (HCC) with RVR   Amiodarone  . Reflux esophagitis   . Rosacea 05/15/2013  . Senile cataract   . Sinus node dysfunction (HCC)   . Tracheobronchomalacia   . Unspecified vitamin D deficiency   . Urinary incontinence   . Xerostomia     Past Surgical History:  Procedure Laterality Date  . CATARACT EXTRACTION  2008  . PACEMAKER PLACEMENT  10/21/2010   STJ, dural chamber Dr. [name]  . TOTAL KNEE ARTHROPLASTY  1999   right knee  . TOTAL KNEE ARTHROPLASTY  2003   left knee  . TUBAL LIGATION  1964  . Tummy tuck  2000   pt "almost died"; respiratory distress, became delrious    Allergies  Allergen Reactions  . Codeine     Extreme lethargy  . Iron     By infusion  . Morphine   . Toviaz [Fesoterodine Fumarate]     Outpatient Encounter Medications as of 09/12/2018  Medication Sig  . acetaminophen (TYLENOL) 325 MG tablet Take 650 mg by mouth every 4 (four) hours as needed.   Marland Kitchen allopurinol (ZYLOPRIM) 100 MG tablet Take 100 mg by mouth daily.  Marland Kitchen ezetimibe (ZETIA) 10 MG tablet Take 10 mg by mouth daily.  . fluticasone (FLONASE) 50 MCG/ACT nasal spray  Place 2 sprays into the nose 2 (two) times daily. 2 sprays in each nostril  . levothyroxine (SYNTHROID, LEVOTHROID) 75 MCG tablet   . polyethylene glycol powder (MIRALAX) powder Take 17 g by mouth as needed for mild constipation.   . potassium chloride SA (K-DUR,KLOR-CON) 20 MEQ tablet Take 40 mEq by mouth daily.  . pseudoephedrine-guaifenesin (MUCINEX D) 60-600 MG 12 hr tablet Take 1 tablet by mouth as needed for congestion.   . Rivaroxaban (XARELTO) 15 MG TABS tablet Take 1 tablet (15 mg total) by mouth daily with supper.  . sertraline (ZOLOFT) 25 MG tablet Take 25 mg by mouth daily.  Marland Kitchen torsemide (DEMADEX) 20 MG tablet Take 40 mg by mouth daily.   No facility-administered encounter medications on file as of 09/12/2018.     Review of Systems:  Review of Systems  Constitutional: Negative for chills, fever and malaise/fatigue.  HENT: Positive for congestion. Negative for sore throat.        Postnasal drip  Eyes: Negative for blurred vision.  Respiratory: Negative for cough and shortness of breath.   Cardiovascular: Negative for chest pain, palpitations and leg swelling.  Gastrointestinal: Negative for abdominal pain, blood in stool, constipation, diarrhea and melena.  Genitourinary: Negative for dysuria.  Musculoskeletal: Negative for falls and joint pain.       Left leg still painful from fall  Skin: Negative for itching and rash.       Area on buttocks back   Neurological: Negative for dizziness and loss of consciousness.  Endo/Heme/Allergies: Bruises/bleeds easily.  Psychiatric/Behavioral: Negative for depression and memory loss. The patient is not nervous/anxious and does not have insomnia.        Down at times, but says no good reason, just can't do as much b/c children are busy    Health Maintenance  Topic Date Due  . TETANUS/TDAP  07/07/2025  . INFLUENZA VACCINE  Completed  . DEXA SCAN  Completed  . PNA vac Low Risk Adult  Completed    Physical Exam: Vitals:   09/12/18  1106  BP: 140/80  Pulse: 78  Temp: 99.6 F (37.6 C)  TempSrc: Oral  SpO2: 92%  Weight: 202 lb (91.6 kg)  Height: 5\' 4"  (1.626 m)   Body mass index is 34.67 kg/m. Physical Exam  Constitutional: She is oriented to person, place, and time. She appears well-developed and well-nourished. No distress.  HENT:  Head: Normocephalic and atraumatic.  Cardiovascular: Normal rate, regular rhythm, normal heart sounds and intact distal pulses.  Pulmonary/Chest: Effort normal  and breath sounds normal. No respiratory distress. She has no wheezes.  Abdominal: Bowel sounds are normal.  Musculoskeletal: Normal range of motion.  Came down in power scooter today  Neurological: She is alert and oriented to person, place, and time.  Skin: Skin is warm and dry. Capillary refill takes less than 2 seconds.  Left lower leg remains erythematous to brownish in color, lighter area still raised on proximal lateral shin from injury, remains tender there  Psychiatric: She has a normal mood and affect.    Labs reviewed: Basic Metabolic Panel: Recent Labs    11/29/17 1646 12/05/17 0700 03/06/18 03/06/18 0600  NA 146* 145  --  146  K 4.7 4.9  --  4.4  CL 102  --   --   --   CO2 29  --   --   --   GLUCOSE 137*  --   --   --   BUN 33* 12  --  22*  CREATININE 2.04* 1.4*  --  1.8*  CALCIUM 9.0  --   --   --   TSH 0.965  --  3.65  --    Liver Function Tests: Recent Labs    03/06/18 0600  AST 18  ALT 11  ALKPHOS 91   No results for input(s): LIPASE, AMYLASE in the last 8760 hours. No results for input(s): AMMONIA in the last 8760 hours. CBC: Recent Labs    03/06/18  WBC 7.5  HGB 11.4*  HCT 35*  PLT 178   Lipid Panel: No results for input(s): CHOL, HDL, LDLCALC, TRIG, CHOLHDL, LDLDIRECT in the last 8760 hours. Lab Results  Component Value Date   HGBA1C 5.5 09/06/2016    Assessment/Plan 1. Leg hematoma, left, initial encounter - remains present from early November with some tenderness,  imaging at time was unremarkable, has gradually improved, but still feels weak and unsteady ever since -will involve PT here to help with this  2. MDS (myelodysplastic syndrome), low grade (HCC) -f/u cbc to be sure her increased fatigue is not related and due to anticoagulant  3. Chronic diastolic heart failure (HCC) -no signs of acute exacerbation, cont same regimen  4. Paroxysmal atrial fibrillation (HCC) -sounds regular today, off amiodarone due to tremor with mild improvement, cont xarelto, not on anything for rate control now but rate normal  5. Hypercoagulable state due to atrial fibrillation ([place]) -cont xarelto therapy   6. OSA treated with BiPAP -cont bipap, causes her increased congestion and postnasal drip in mornings--using flonase some and mucinex  7. Benign essential tremor -still a problem off amiodarone, but not quite as severe, does not want to take meds for it at this point and does not drink alcohol  Labs/tests ordered:  Cbc, bmp, flp, tsh next draw Next appt:  6 mos f/u  Kavion Mancinas L. Lochlin Eppinger, D.O. [facility] [address], [place] [address] Cell Phone (Mon-Fri 8am-5pm):  [phone] On Call:  [phone] & follow prompts after 5pm & weekends Office Phone:  [phone] Office Fax:  [phone]

## 2018-09-13 DIAGNOSIS — D649 Anemia, unspecified: Secondary | ICD-10-CM | POA: Diagnosis not present

## 2018-09-13 DIAGNOSIS — E039 Hypothyroidism, unspecified: Secondary | ICD-10-CM | POA: Diagnosis not present

## 2018-09-13 DIAGNOSIS — E785 Hyperlipidemia, unspecified: Secondary | ICD-10-CM | POA: Diagnosis not present

## 2018-09-13 LAB — CBC AND DIFFERENTIAL
HCT: 33 — AB (ref 36–46)
Hemoglobin: 10.9 — AB (ref 12.0–16.0)
Platelets: 169 (ref 150–399)
WBC: 7.4

## 2018-09-19 ENCOUNTER — Encounter: Payer: Self-pay | Admitting: Internal Medicine

## 2018-10-02 DIAGNOSIS — R278 Other lack of coordination: Secondary | ICD-10-CM | POA: Diagnosis not present

## 2018-10-02 DIAGNOSIS — R2689 Other abnormalities of gait and mobility: Secondary | ICD-10-CM | POA: Diagnosis not present

## 2018-10-02 DIAGNOSIS — G250 Essential tremor: Secondary | ICD-10-CM | POA: Diagnosis not present

## 2018-10-02 DIAGNOSIS — I5032 Chronic diastolic (congestive) heart failure: Secondary | ICD-10-CM | POA: Diagnosis not present

## 2018-10-04 DIAGNOSIS — R2689 Other abnormalities of gait and mobility: Secondary | ICD-10-CM | POA: Diagnosis not present

## 2018-10-04 DIAGNOSIS — R278 Other lack of coordination: Secondary | ICD-10-CM | POA: Diagnosis not present

## 2018-10-04 DIAGNOSIS — Z9181 History of falling: Secondary | ICD-10-CM | POA: Diagnosis not present

## 2018-10-04 DIAGNOSIS — J449 Chronic obstructive pulmonary disease, unspecified: Secondary | ICD-10-CM | POA: Diagnosis not present

## 2018-10-04 DIAGNOSIS — G250 Essential tremor: Secondary | ICD-10-CM | POA: Diagnosis not present

## 2018-10-04 DIAGNOSIS — I5032 Chronic diastolic (congestive) heart failure: Secondary | ICD-10-CM | POA: Diagnosis not present

## 2018-10-08 DIAGNOSIS — I5032 Chronic diastolic (congestive) heart failure: Secondary | ICD-10-CM | POA: Diagnosis not present

## 2018-10-08 DIAGNOSIS — Z9181 History of falling: Secondary | ICD-10-CM | POA: Diagnosis not present

## 2018-10-08 DIAGNOSIS — J449 Chronic obstructive pulmonary disease, unspecified: Secondary | ICD-10-CM | POA: Diagnosis not present

## 2018-10-08 DIAGNOSIS — G250 Essential tremor: Secondary | ICD-10-CM | POA: Diagnosis not present

## 2018-10-08 DIAGNOSIS — R278 Other lack of coordination: Secondary | ICD-10-CM | POA: Diagnosis not present

## 2018-10-08 DIAGNOSIS — R2689 Other abnormalities of gait and mobility: Secondary | ICD-10-CM | POA: Diagnosis not present

## 2018-10-11 DIAGNOSIS — G250 Essential tremor: Secondary | ICD-10-CM | POA: Diagnosis not present

## 2018-10-11 DIAGNOSIS — R278 Other lack of coordination: Secondary | ICD-10-CM | POA: Diagnosis not present

## 2018-10-11 DIAGNOSIS — J449 Chronic obstructive pulmonary disease, unspecified: Secondary | ICD-10-CM | POA: Diagnosis not present

## 2018-10-11 DIAGNOSIS — R2689 Other abnormalities of gait and mobility: Secondary | ICD-10-CM | POA: Diagnosis not present

## 2018-10-11 DIAGNOSIS — Z9181 History of falling: Secondary | ICD-10-CM | POA: Diagnosis not present

## 2018-10-11 DIAGNOSIS — I5032 Chronic diastolic (congestive) heart failure: Secondary | ICD-10-CM | POA: Diagnosis not present

## 2018-10-11 LAB — CUP PACEART REMOTE DEVICE CHECK
Battery Remaining Longevity: 64 mo
Battery Remaining Percentage: 51 %
Battery Voltage: 2.87 V
Brady Statistic AP VP Percent: 1 %
Brady Statistic AP VS Percent: 59 %
Brady Statistic AS VP Percent: 1 %
Brady Statistic AS VS Percent: 41 %
Brady Statistic RA Percent Paced: 59 %
Brady Statistic RV Percent Paced: 1 %
Date Time Interrogation Session: 20191107070021
Implantable Lead Implant Date: 20120119
Implantable Lead Implant Date: 20120119
Implantable Lead Location: 753859
Implantable Lead Location: 753860
Implantable Lead Model: 1948
Implantable Pulse Generator Implant Date: 20120119
Lead Channel Impedance Value: 390 Ohm
Lead Channel Impedance Value: 750 Ohm
Lead Channel Pacing Threshold Amplitude: 0.5 V
Lead Channel Pacing Threshold Amplitude: 0.75 V
Lead Channel Pacing Threshold Pulse Width: 0.5 ms
Lead Channel Pacing Threshold Pulse Width: 0.5 ms
Lead Channel Sensing Intrinsic Amplitude: 1.6 mV
Lead Channel Sensing Intrinsic Amplitude: 7.9 mV
Lead Channel Setting Pacing Amplitude: 1.5 V
Lead Channel Setting Pacing Amplitude: 2.5 V
Lead Channel Setting Pacing Pulse Width: 0.5 ms
Lead Channel Setting Sensing Sensitivity: 2 mV
Pulse Gen Model: 2210
Pulse Gen Serial Number: 7198677

## 2018-10-18 DIAGNOSIS — R278 Other lack of coordination: Secondary | ICD-10-CM | POA: Diagnosis not present

## 2018-10-18 DIAGNOSIS — J449 Chronic obstructive pulmonary disease, unspecified: Secondary | ICD-10-CM | POA: Diagnosis not present

## 2018-10-18 DIAGNOSIS — I5032 Chronic diastolic (congestive) heart failure: Secondary | ICD-10-CM | POA: Diagnosis not present

## 2018-10-18 DIAGNOSIS — Z9181 History of falling: Secondary | ICD-10-CM | POA: Diagnosis not present

## 2018-10-18 DIAGNOSIS — R2689 Other abnormalities of gait and mobility: Secondary | ICD-10-CM | POA: Diagnosis not present

## 2018-10-18 DIAGNOSIS — G250 Essential tremor: Secondary | ICD-10-CM | POA: Diagnosis not present

## 2018-10-29 ENCOUNTER — Telehealth: Payer: Self-pay | Admitting: Pulmonary Disease

## 2018-10-29 NOTE — Telephone Encounter (Signed)
Pt says [facility] told her they need documentation from our office stating she is compliant with BiPAP. Pt got her new machine in August and never had 31-90 day follow up.    Pt is going to be seen by Eric Form Wednesday 2:00pm. Nothing more needed at this time.

## 2018-10-31 ENCOUNTER — Encounter: Payer: Self-pay | Admitting: Acute Care

## 2018-10-31 ENCOUNTER — Ambulatory Visit (INDEPENDENT_AMBULATORY_CARE_PROVIDER_SITE_OTHER): Payer: Medicare Other | Admitting: Acute Care

## 2018-10-31 DIAGNOSIS — G4733 Obstructive sleep apnea (adult) (pediatric): Secondary | ICD-10-CM

## 2018-10-31 NOTE — Progress Notes (Signed)
History of Present Illness Cindy Cervantes is a 85 y.o. female  former smoker with chronic hypoxic respiratory failure 2nd to OSA ( On BiPAP), tracheobronchomalacia, and Rt hemidiaphragm elevation.She is followed by Dr. Halford Cervantes.  BiPAP settings  12/8 cm H2O 2L oxygen bled in for nocturnal hypoxemia    10/31/2018  Compliance BiPAP visit Pt. Presents today for compliance visit. She got a new BiPAP machine in August and didn't have her 31-90 day follow up to document compliance. Cindy Cervantes is requesting  this face to face to confirm compliance with use. She denies any issues with her equipment. She needs supplies and cannot get them from her DME without this visit. She is using the so clean to clean her machine.She denies  morning head aches.She states she has had some shortness of breath with exertion. She is declining a walk test today.She is compliant with her Oxygen at night. She did fall in November 2019  and had physical Therapy. She states it was a balance issue. She has recovered. She is walking with a walker.She denies fever, chest pain, orthopnea or hemoptysis.  Epworth Score:11  Down Load  Air Curve 10 VAuto Spontaneous mode IPAP 12 cm H2O EPAP 8 cm H2O 10/01/2018-10/30/2018 Usage Days 30/30 > 4 hours 29 days < 4 hours 1 day  Average usage 8 hours 14 minutes AHI= 1.6   Pulmonary tests: PFT's March 29 2010 >> FEV1 1.34 (72) with ratio 75 and ERV 42, DLC0 55 > corrects to 137 CT chest 10/17/10 >> GGO LUL, no PE, tracheobronchomalacia involving the visualized trachea with areas of air trapping seen throughout both lungs  Sleep tests PSG 12/14/10 >> AHI 17.9, REM 40.8 BPAP titraton 01/12/11 >> 18/13 cm H2O with 3 liters oxygen. ONO on BiPAP 03/16/15 >>Test time 8 hrs 2 min. Mean SpO2 90.3%, low SpO2 70%. Spent 33 min with SpO2 <88%. ONO with Bipap 02/13/17 >>test time 7 hrs 5 min. Average SpO2 90%, low SpO2 80%. Spent 1 hr 41 min with SpO2 <88% Bipap 01/03/18 to 02/01/18 >>  used on 30 of 30 nights with average 7 hrs 54 min.  Average AHI 7.1 with Bipap 14/10 cm H2O  CBC Latest Ref Rng & Units 09/13/2018 03/06/2018 09/04/2017  WBC - 7.4 7.5 6.6  Hemoglobin 12.0 - 16.0 10.9(A) 11.4(A) 10.9(A)  Hematocrit 36 - 46 33(A) 35(A) 34(A)  Platelets 150 - 399 169 178 168    BMP Latest Ref Rng & Units 03/06/2018 12/05/2017 11/29/2017  Glucose 65 - 99 mg/dL - - 137(H)  BUN 4 - 21 22(A) 12 33(H)  Creatinine 0.5 - 1.1 1.8(A) 1.4(A) 2.04(H)  BUN/Creat Ratio 12 - 28 - - 16  Sodium 137 - 147 146 145 146(H)  Potassium 3.4 - 5.3 4.4 4.9 4.7  Chloride 96 - 106 mmol/L - - 102  CO2 20 - 29 mmol/L - - 29  Calcium 8.7 - 10.3 mg/dL - - 9.0    BNP No results found for: BNP  ProBNP    Component Value Date/Time   PROBNP 492 11/29/2017 1646   PROBNP 67.0 04/25/2011 1600    PFT No results found for: FEV1PRE, FEV1POST, FVCPRE, FVCPOST, TLC, DLCOUNC, PREFEV1FVCRT, PSTFEV1FVCRT  No results found.   Past medical hx Past Medical History:  Diagnosis Date  . Allergic rhinitis   . Anemia of renal disease 10/21/2011  . Anemia, iron deficiency 10/21/2011  . Cholelithiasis   . Chronic diastolic heart failure ([place])   . Colon polyp   .  Constipation   . Cough    2nd to reflux  . Debility, unspecified   . Dehydration   . Depression   . Diaphragm dysfunction    Right hemidiaphragm  . Disorder of bone and cartilage, unspecified   . Diverticulosis of colon   . Dysphagia   . GERD (gastroesophageal reflux disease) 02/08/2013  . History of hyperkalemia in setting of spironolactone 09/2010  . Hyperlipidemia   . Hypertension   . Hypopotassemia   . Hypothyroidism 02/08/2013  . Long term (current) use of anticoagulants   . MDS (myelodysplastic syndrome), low grade ([place]) 10/21/2011  . Obesity   . Obesity hypoventilation syndrome ([place])   . OSA (obstructive sleep apnea)   . Osteoarthrosis, unspecified whether generalized or localized, unspecified site   . Other specified disease of white  blood cells   . Pacemaker    Implanted December 2012  . Paroxysmal A-fib (HCC) with RVR   Amiodarone  . Reflux esophagitis   . Rosacea 05/15/2013  . Senile cataract   . Sinus node dysfunction (HCC)   . Tracheobronchomalacia   . Unspecified vitamin D deficiency   . Urinary incontinence   . Xerostomia      Social History   Tobacco Use  . Smoking status: Former Smoker    Packs/day: 0.50    Types: Cigarettes    Last attempt to quit: 10/03/1982    Years since quitting: 36.1  . Smokeless tobacco: Never Used  Substance Use Topics  . Alcohol use: Yes    Comment: very rarely.  none in one year  . Drug use: No    Cindy Cervantes reports that she quit smoking about 36 years ago. Her smoking use included cigarettes. She smoked 0.50 packs per day. She has never used smokeless tobacco. She reports current alcohol use. She reports that she does not use drugs.  Tobacco Cessation: Former smoker , Quit 1984  Past surgical hx, Family hx, Social hx all reviewed.  Current Outpatient Medications on File Prior to Visit  Medication Sig  . acetaminophen (TYLENOL) 325 MG tablet Take 650 mg by mouth every 4 (four) hours as needed.   Marland Kitchen allopurinol (ZYLOPRIM) 100 MG tablet Take 100 mg by mouth daily.  Marland Kitchen ezetimibe (ZETIA) 10 MG tablet Take 10 mg by mouth daily.  . fluticasone (FLONASE) 50 MCG/ACT nasal spray Place 2 sprays into the nose 2 (two) times daily. 2 sprays in each nostril  . levothyroxine (SYNTHROID, LEVOTHROID) 75 MCG tablet   . polyethylene glycol powder (MIRALAX) powder Take 17 g by mouth as needed for mild constipation.   . potassium chloride SA (K-DUR,KLOR-CON) 20 MEQ tablet Take 40 mEq by mouth daily.  . pseudoephedrine-guaifenesin (MUCINEX D) 60-600 MG 12 hr tablet Take 1 tablet by mouth as needed for congestion.   . Rivaroxaban (XARELTO) 15 MG TABS tablet Take 1 tablet (15 mg total) by mouth daily with supper.  . sertraline (ZOLOFT) 25 MG tablet Take 25 mg by mouth daily.  Marland Kitchen torsemide  (DEMADEX) 20 MG tablet Take 40 mg by mouth daily.   No current facility-administered medications on file prior to visit.      Allergies  Allergen Reactions  . Codeine     Extreme lethargy  . Iron     By infusion  . Morphine   . Toviaz [Fesoterodine Fumarate]     Review Of Systems:  Constitutional:   No  weight loss, night sweats,  Fevers, chills, fatigue, or  lassitude.  HEENT:   No  headaches,  Difficulty swallowing,  Tooth/dental problems, or  Sore throat,                No sneezing, itching, ear ache, nasal congestion, post nasal drip,   CV:  No chest pain,  Orthopnea, PND, swelling in lower extremities, anasarca, dizziness, palpitations, syncope.   GI  No heartburn, indigestion, abdominal pain, nausea, vomiting, diarrhea, change in bowel habits, loss of appetite, bloody stools.   Resp: No shortness of breath with exertion or at rest.  No excess mucus, no productive cough,  No non-productive cough,  No coughing up of blood.  No change in color of mucus.  No wheezing.  No chest wall deformity  Skin: no rash or lesions.  GU: no dysuria, change in color of urine, no urgency or frequency.  No flank pain, no hematuria   MS:  No joint pain or swelling.  No decreased range of motion.  No back pain.  Psych:  No change in mood or affect. No depression or anxiety.  No memory loss.   Vital Signs BP 132/80 (BP Location: Right Arm, Cuff Size: Large)   Pulse 82   Ht 5\' 4"  (1.626 m)   Wt 201 lb 3.2 oz (91.3 kg)   SpO2 96%   BMI 34.54 kg/m    Physical Exam:  General- No distress,  A&Ox3, pleasant ENT: No sinus tenderness, TM clear, pale nasal mucosa, no oral exudate,+ post nasal drip, no LAN Cardiac: S1, S2, regular rate and rhythm, no murmur Chest: No wheeze/ rales/ dullness; no accessory muscle use, no nasal flaring, no sternal retractions, diminished per bases Abd.: Soft Non-tender, ND, BS +, Body mass index is 34.54 kg/m. Ext: No clubbing cyanosis, edema Neuro:  normal  strength, walks with a walker, MAE x 4, A&O x 3 Skin: No rashes, warm and dry and intact, no lesions or rash Psych: normal mood and behavior   Assessment/Plan  OSA treated with BiPAP Compliant with BiPAP Plan: It is nice to meet you today. We will make sure Cindy Cervantes knows you have been compliant with your BiPAP therapy. We will order your supplies You are doing great on your BiPAP. Continue on BiPAP at bedtime. You appear to be benefiting from the treatment Goal is to wear for at least 6 hours each night for maximal clinical benefit. Continue to work on weight loss, as the link between excess weight  and sleep apnea is well established.  Do not drive if sleepy. Remember to clean mask, tubing, filter, and reservoir once weekly with soapy water.  Follow up with Dr. Halford Cervantes  In 1 year or sooner if needed . Please contact office for sooner follow up if symptoms do not improve or worsen or seek emergency care      Magdalen Spatz, NP 10/31/2018  2:36 PM

## 2018-10-31 NOTE — Assessment & Plan Note (Signed)
Compliant with BiPAP Plan: It is nice to meet you today. We will make sure Cindy Cervantes knows you have been compliant with your BiPAP therapy. We will order your supplies You are doing great on your BiPAP. Continue on BiPAP at bedtime. You appear to be benefiting from the treatment Goal is to wear for at least 6 hours each night for maximal clinical benefit. Continue to work on weight loss, as the link between excess weight  and sleep apnea is well established.  Do not drive if sleepy. Remember to clean mask, tubing, filter, and reservoir once weekly with soapy water.  Follow up with Dr. Halford Chessman  In 1 year or sooner if needed . Please contact office for sooner follow up if symptoms do not improve or worsen or seek emergency care

## 2018-10-31 NOTE — Patient Instructions (Addendum)
It is nice to meet you today. We will make sure Cindy Cervantes knows you have been compliant with your BiPAP therapy. We will order your supplies You are doing great on your BiPAP. Continue on BiPAP at bedtime. You appear to be benefiting from the treatment Goal is to wear for at least 6 hours each night for maximal clinical benefit. Continue to work on weight loss, as the link between excess weight  and sleep apnea is well established.  Do not drive if sleepy. Remember to clean mask, tubing, filter, and reservoir once weekly with soapy water.  Follow up with Dr. Halford Chessman  In 1 year or sooner if needed . Please contact office for sooner follow up if symptoms do not improve or worsen or seek emergency care

## 2018-11-01 NOTE — Progress Notes (Signed)
Reviewed and agree with assessment/plan.   Sheletha Bow, MD [facility] 09/28/2016, 12:24 PM Pager:  [phone]  

## 2018-11-08 ENCOUNTER — Ambulatory Visit (INDEPENDENT_AMBULATORY_CARE_PROVIDER_SITE_OTHER): Payer: Medicare Other

## 2018-11-08 DIAGNOSIS — I5032 Chronic diastolic (congestive) heart failure: Secondary | ICD-10-CM

## 2018-11-08 DIAGNOSIS — I495 Sick sinus syndrome: Secondary | ICD-10-CM

## 2018-11-09 LAB — CUP PACEART REMOTE DEVICE CHECK
Battery Remaining Longevity: 56 mo
Battery Remaining Percentage: 46 %
Battery Voltage: 2.86 V
Brady Statistic AP VP Percent: 1 %
Brady Statistic AP VS Percent: 53 %
Brady Statistic AS VS Percent: 46 %
Brady Statistic RA Percent Paced: 51 %
Brady Statistic RV Percent Paced: 1 %
Date Time Interrogation Session: 20200206070019
Implantable Lead Implant Date: 20120119
Implantable Lead Implant Date: 20120119
Implantable Lead Location: 753859
Implantable Lead Location: 753860
Implantable Lead Model: 1948
Implantable Pulse Generator Implant Date: 20120119
Lead Channel Impedance Value: 400 Ohm
Lead Channel Impedance Value: 780 Ohm
Lead Channel Pacing Threshold Amplitude: 0.5 V
Lead Channel Pacing Threshold Amplitude: 0.75 V
Lead Channel Pacing Threshold Pulse Width: 0.5 ms
Lead Channel Pacing Threshold Pulse Width: 0.5 ms
Lead Channel Sensing Intrinsic Amplitude: 1.7 mV
Lead Channel Sensing Intrinsic Amplitude: 7.8 mV
Lead Channel Setting Pacing Amplitude: 1.5 V
Lead Channel Setting Pacing Amplitude: 2.5 V
Lead Channel Setting Sensing Sensitivity: 2 mV
MDC IDC PG SERIAL: 7198677
MDC IDC SET LEADCHNL RV PACING PULSEWIDTH: 0.5 ms
MDC IDC STAT BRADY AS VP PERCENT: 1 %

## 2018-11-11 NOTE — Progress Notes (Signed)
Electrophysiology Office Note Date: 11/12/2018  ID:  Cindy, Cervantes 07-07-1932, MRN [id number]  PCP: Gayland Curry, DO Electrophysiologist: [name]  CC: Pacemaker follow-up  Cindy Cervantes is a 85 y.o. female seen today for Dr [name].  She presents today for routine electrophysiology followup.  Since last being seen in our clinic, the patient reports doing relatively well.  She awoke with back pain this morning that is relieved with massage. She has had occasional fluttering.   She denies chest pain, dyspnea, PND, orthopnea, nausea, vomiting, dizziness, syncope, edema, weight gain, or early satiety.  Device History: STJ dual chamber PPM implanted 2012 for tachy/brady   Past Medical History:  Diagnosis Date  . Allergic rhinitis   . Anemia of renal disease 10/21/2011  . Anemia, iron deficiency 10/21/2011  . Cholelithiasis   . Chronic diastolic heart failure ([place])   . Colon polyp   . Constipation   . Cough    2nd to reflux  . Debility, unspecified   . Dehydration   . Depression   . Diaphragm dysfunction    Right hemidiaphragm  . Disorder of bone and cartilage, unspecified   . Diverticulosis of colon   . Dysphagia   . GERD (gastroesophageal reflux disease) 02/08/2013  . History of hyperkalemia in setting of spironolactone 09/2010  . Hyperlipidemia   . Hypertension   . Hypopotassemia   . Hypothyroidism 02/08/2013  . Long term (current) use of anticoagulants   . MDS (myelodysplastic syndrome), low grade ([place]) 10/21/2011  . Obesity   . Obesity hypoventilation syndrome ([place])   . OSA (obstructive sleep apnea)   . Osteoarthrosis, unspecified whether generalized or localized, unspecified site   . Other specified disease of white blood cells   . Pacemaker    Implanted December 2012  . Paroxysmal A-fib (HCC) with RVR   Amiodarone  . Reflux esophagitis   . Rosacea 05/15/2013  . Senile cataract   . Sinus node dysfunction (HCC)   . Tracheobronchomalacia   . Unspecified  vitamin D deficiency   . Urinary incontinence   . Xerostomia    Past Surgical History:  Procedure Laterality Date  . CATARACT EXTRACTION  2008  . PACEMAKER PLACEMENT  10/21/2010   STJ, dural chamber Dr. [name]  . TOTAL KNEE ARTHROPLASTY  1999   right knee  . TOTAL KNEE ARTHROPLASTY  2003   left knee  . TUBAL LIGATION  1964  . Tummy tuck  2000   pt "almost died"; respiratory distress, became delrious    Current Outpatient Medications  Medication Sig Dispense Refill  . acetaminophen (TYLENOL) 325 MG tablet Take 650 mg by mouth every 4 (four) hours as needed.     Marland Kitchen allopurinol (ZYLOPRIM) 100 MG tablet Take 100 mg by mouth daily.    Marland Kitchen ezetimibe (ZETIA) 10 MG tablet Take 10 mg by mouth daily.    . fluticasone (FLONASE) 50 MCG/ACT nasal spray Place 2 sprays into the nose 2 (two) times daily. 2 sprays in each nostril    . levothyroxine (SYNTHROID, LEVOTHROID) 75 MCG tablet Take 75 mcg by mouth daily before breakfast.     . polyethylene glycol powder (MIRALAX) powder Take 17 g by mouth as needed for mild constipation.     . potassium chloride SA (K-DUR,KLOR-CON) 20 MEQ tablet Take 40 mEq by mouth daily.    . pseudoephedrine-guaifenesin (MUCINEX D) 60-600 MG 12 hr tablet Take 1 tablet by mouth as needed for congestion.     . Rivaroxaban (  XARELTO) 15 MG TABS tablet Take 1 tablet (15 mg total) by mouth daily with supper. 30 tablet   . sertraline (ZOLOFT) 25 MG tablet Take 25 mg by mouth daily.    Marland Kitchen torsemide (DEMADEX) 20 MG tablet Take 40 mg by mouth daily.     No current facility-administered medications for this visit.     Allergies:   Codeine; Iron; Morphine; and Toviaz [fesoterodine fumarate]   Social History: Social History   Socioeconomic History  . Marital status: Widowed    Spouse name: Not on file  . Number of children: Not on file  . Years of education: Not on file  . Highest education level: Not on file  Occupational History  . Occupation: retired    Fish farm manager: RETIRED     Comment: Network engineer   Social Needs  . Financial resource strain: Not hard at all  . Food insecurity:    Worry: Never true    Inability: Never true  . Transportation needs:    Medical: No    Non-medical: No  Tobacco Use  . Smoking status: Former Smoker    Packs/day: 0.50    Types: Cigarettes    Last attempt to quit: 10/03/1982    Years since quitting: 36.1  . Smokeless tobacco: Never Used  Substance and Sexual Activity  . Alcohol use: Yes    Comment: very rarely.  none in one year  . Drug use: No  . Sexual activity: Never  Lifestyle  . Physical activity:    Days per week: 0 days    Minutes per session: 0 min  . Stress: Only a little  Relationships  . Social connections:    Talks on phone: Three times a week    Gets together: Three times a week    Attends religious service: Never    Active member of club or organization: No    Attends meetings of clubs or organizations: Never    Relationship status: Widowed  . Intimate partner violence:    Fear of current or ex partner: No    Emotionally abused: No    Physically abused: No    Forced sexual activity: No  Other Topics Concern  . Not on file  Social History Narrative   Widowed 2002 (married 1955), originally form [place]. 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 Moved from [place], [place] to [place], [place] section 2009. Moved to [place] section 2012.    Non smoker, mininmal alcohol.    Has POA   Power wheelchair, walker   Exercise none                Family History: Family History  Problem Relation Age of Onset  . Heart disease Father   . Parkinson's disease Sister   . Heart disease Brother   . Parkinson's disease Brother      Review of Systems: All other systems reviewed and are otherwise negative except as noted above.   Physical Exam: VS:  BP (!) 168/84   Pulse 84   Ht 5\' 4"  (1.626 m)   Wt 203 lb (92.1 kg)   SpO2 91%    BMI 34.84 kg/m  , BMI Body mass index is 34.84 kg/m.  GEN- The patient is elderly appearing, alert and oriented x 3 today.   HEENT: normocephalic, atraumatic; sclera clear, conjunctiva pink; hearing intact; oropharynx clear; neck supple  Lungs- Clear to ausculation bilaterally, normal work of breathing.  No wheezes, rales, rhonchi  Heart- Regular rate and rhythm  GI- soft, non-tender, non-distended, bowel sounds present  Extremities- no clubbing, cyanosis, or edema  MS- no significant deformity or atrophy Skin- warm and dry, no rash or lesion; PPM pocket well healed Psych- euthymic mood, full affect Neuro- strength and sensation are intact  PPM Interrogation- reviewed in detail today,  See PACEART report  EKG:  EKG is not ordered today.  Recent Labs: 11/29/2017: NT-Pro BNP 492 03/06/2018: ALT 11; BUN 22; Creatinine 1.8; Potassium 4.4; Sodium 146; TSH 3.65 09/13/2018: Hemoglobin 10.9; Platelets 169   Wt Readings from Last 3 Encounters:  11/12/18 203 lb (92.1 kg)  10/31/18 201 lb 3.2 oz (91.3 kg)  09/12/18 202 lb (91.6 kg)     Other studies Reviewed: Additional studies/ records that were reviewed today include: Dr Olin Pia office notes   Assessment and Plan:  1.  Tachy brady syndrome Normal PPM function See Pace Art report No changes today  2.  Paroxysmal atrial fibrillation Burden by device interrogation 4% Continue Xarelto for CHADS2VASC of 4 (dose renally adjusted) Amiodarone discontinued 07/2018 2/2 tremor - she did not see improvement off amiodarone  If significant AF burden with RVR in the future, can consider AVN ablation per Dr Olin Pia last note  CBC, [facility] reviewed   3.  Chronic diastolic heart failure Stable No change required today    Current medicines are reviewed at length with the patient today.   The patient does not have concerns regarding her medicines.  The following changes were made today:  none  Labs/ tests ordered today  include:   Orders Placed This Encounter  Procedures  . CUP PACEART INCLINIC DEVICE CHECK     Disposition:   Follow up with Delilah Shan, Dr [name] 6 months    Signed, Chanetta Marshall, NP 11/12/2018 10:25 AM  [address] [place] [phone] (office) [phone] (fax)

## 2018-11-12 ENCOUNTER — Encounter: Payer: Self-pay | Admitting: Nurse Practitioner

## 2018-11-12 ENCOUNTER — Ambulatory Visit (INDEPENDENT_AMBULATORY_CARE_PROVIDER_SITE_OTHER): Payer: Medicare Other | Admitting: Nurse Practitioner

## 2018-11-12 VITALS — BP 168/84 | HR 84 | Ht 64.0 in | Wt 203.0 lb

## 2018-11-12 DIAGNOSIS — I495 Sick sinus syndrome: Secondary | ICD-10-CM | POA: Diagnosis not present

## 2018-11-12 DIAGNOSIS — I480 Paroxysmal atrial fibrillation: Secondary | ICD-10-CM | POA: Diagnosis not present

## 2018-11-12 DIAGNOSIS — I5032 Chronic diastolic (congestive) heart failure: Secondary | ICD-10-CM

## 2018-11-12 LAB — CUP PACEART INCLINIC DEVICE CHECK
Implantable Lead Implant Date: 20120119
Implantable Lead Implant Date: 20120119
Implantable Lead Location: 753859
Implantable Lead Location: 753860
Implantable Lead Model: 1948
Implantable Pulse Generator Implant Date: 20120119
MDC IDC SESS DTM: 20200210102220
Pulse Gen Model: 2210
Pulse Gen Serial Number: 7198677

## 2018-11-12 NOTE — Patient Instructions (Signed)
Medication Instructions:  NONE If you need a refill on your cardiac medications before your next appointment, please call your pharmacy.   Lab work: NONE If you have labs (blood work) drawn today and your tests are completely normal, you will receive your results only by: Marland Kitchen MyChart Message (if you have MyChart) OR . A paper copy in the mail If you have any lab test that is abnormal or we need to change your treatment, we will call you to review the results.  Testing/Procedures: NONE  Follow-Up: At [facility], you and your health needs are our priority.  As part of our continuing mission to provide you with exceptional heart care, we have created designated Provider Care Teams.  These Care Teams include your primary Cardiologist (physician) and Advanced Practice Providers (APPs -  Physician Assistants and Nurse Practitioners) who all work together to provide you with the care you need, when you need it. You will need a follow up appointment in 6 months.  Please call our office 2 months in advance to schedule this appointment.  You may see Dr [name] or one of the following Advanced Practice Providers on your designated Care Team:   Chanetta Marshall, NP . Tommye Standard, PA-C  Any Other Special Instructions Will Be Listed Below (If Applicable). Remote monitoring is used to monitor your Pacemaker  from home. This monitoring reduces the number of office visits required to check your device to one time per year. It allows [place] to keep an eye on the functioning of your device to ensure it is working properly. You are scheduled for a device check from home on 02/11/19. You may send your transmission at any time that day. If you have a wireless device, the transmission will be sent automatically. After your physician reviews your transmission, you will receive a postcard with your next transmission date.

## 2018-11-14 ENCOUNTER — Telehealth: Payer: Self-pay | Admitting: Pulmonary Disease

## 2018-11-14 DIAGNOSIS — G4733 Obstructive sleep apnea (adult) (pediatric): Secondary | ICD-10-CM

## 2018-11-14 NOTE — Telephone Encounter (Signed)
Instructions      Return in about 1 year (around 11/01/2019), or if symptoms worsen or fail to improve.  It is nice to meet you today. We will make sure Cindy Cervantes knows you have been compliant with your BiPAP therapy. We will order your supplies You are doing great on your BiPAP. Continue on BiPAP at bedtime. You appear to be benefiting from the treatment Goal is to wear for at least 6 hours each night for maximal clinical benefit. Continue to work on weight loss, as the link between excess weight  and sleep apnea is well established.  Do not drive if sleepy. Remember to clean mask, tubing, filter, and reservoir once weekly with soapy water.  Follow up with Dr. Halford Chessman  In 1 year or sooner if needed . Please contact office for sooner follow up if symptoms do not improve or worsen or seek emergency care       ____________________________________________________________________________________  Hulen Skains and spoke with patient, she stated that Cindy Cervantes has not received order from [place] in regards to BiPap supplies. Apologized for this. Order has been placed. Nothing further needed.

## 2018-11-15 ENCOUNTER — Encounter: Payer: Medicare Other | Admitting: Nurse Practitioner

## 2018-11-19 NOTE — Progress Notes (Signed)
Remote pacemaker transmission.   

## 2019-01-27 ENCOUNTER — Encounter: Payer: Self-pay | Admitting: Internal Medicine

## 2019-02-14 ENCOUNTER — Ambulatory Visit (INDEPENDENT_AMBULATORY_CARE_PROVIDER_SITE_OTHER): Payer: Medicare Other | Admitting: *Deleted

## 2019-02-14 ENCOUNTER — Other Ambulatory Visit: Payer: Self-pay

## 2019-02-14 DIAGNOSIS — I495 Sick sinus syndrome: Secondary | ICD-10-CM | POA: Diagnosis not present

## 2019-02-14 LAB — CUP PACEART REMOTE DEVICE CHECK
Date Time Interrogation Session: 20200514184816
Implantable Lead Implant Date: 20120119
Implantable Lead Implant Date: 20120119
Implantable Lead Location: 753859
Implantable Lead Location: 753860
Implantable Lead Model: 1948
Implantable Pulse Generator Implant Date: 20120119
Pulse Gen Model: 2210
Pulse Gen Serial Number: 7198677

## 2019-02-20 ENCOUNTER — Encounter: Payer: Self-pay | Admitting: Cardiology

## 2019-02-20 NOTE — Progress Notes (Signed)
Remote pacemaker transmission.   

## 2019-03-13 DIAGNOSIS — Z20828 Contact with and (suspected) exposure to other viral communicable diseases: Secondary | ICD-10-CM | POA: Diagnosis not present

## 2019-03-27 ENCOUNTER — Encounter: Payer: Self-pay | Admitting: Internal Medicine

## 2019-04-03 ENCOUNTER — Encounter: Payer: Self-pay | Admitting: Internal Medicine

## 2019-04-10 ENCOUNTER — Non-Acute Institutional Stay: Payer: Medicare Other | Admitting: Internal Medicine

## 2019-04-10 ENCOUNTER — Other Ambulatory Visit: Payer: Self-pay

## 2019-04-10 ENCOUNTER — Encounter: Payer: Self-pay | Admitting: Internal Medicine

## 2019-04-10 VITALS — BP 138/80 | HR 79 | Temp 98.8°F | Ht 64.0 in | Wt 205.0 lb

## 2019-04-10 DIAGNOSIS — D61818 Other pancytopenia: Secondary | ICD-10-CM | POA: Diagnosis not present

## 2019-04-10 DIAGNOSIS — R109 Unspecified abdominal pain: Secondary | ICD-10-CM | POA: Diagnosis not present

## 2019-04-10 DIAGNOSIS — F3341 Major depressive disorder, recurrent, in partial remission: Secondary | ICD-10-CM | POA: Diagnosis not present

## 2019-04-10 DIAGNOSIS — L89310 Pressure ulcer of right buttock, unstageable: Secondary | ICD-10-CM | POA: Diagnosis not present

## 2019-04-10 DIAGNOSIS — R1903 Right lower quadrant abdominal swelling, mass and lump: Secondary | ICD-10-CM | POA: Insufficient documentation

## 2019-04-10 DIAGNOSIS — J9611 Chronic respiratory failure with hypoxia: Secondary | ICD-10-CM

## 2019-04-10 DIAGNOSIS — D4620 Refractory anemia with excess of blasts, unspecified: Secondary | ICD-10-CM | POA: Diagnosis not present

## 2019-04-10 DIAGNOSIS — K5904 Chronic idiopathic constipation: Secondary | ICD-10-CM | POA: Diagnosis not present

## 2019-04-10 DIAGNOSIS — D46Z Other myelodysplastic syndromes: Secondary | ICD-10-CM

## 2019-04-10 NOTE — Progress Notes (Signed)
Location:  Occupational psychologist of Service:  Clinic (12)  Provider: Zyden Suman L. [name], D.O., C.M.D.  Code Status: DNR Goals of Care:  Advanced Directives 03/07/2018  Does Patient Have a Medical Advance Directive? Yes  Type of Paramedic of [place];Out of facility DNR (pink MOST or yellow form)  Does patient want to make changes to medical advance directive? No - Patient declined  Copy of [place] in Chart? Yes  Pre-existing out of facility DNR order (yellow form or pink MOST form) Yellow form placed in chart (order not valid for inpatient use)     Chief Complaint  Patient presents with  . Medical Management of Chronic Issues    72mth follow-up    HPI: Patient is a 85 y.o. female seen today for medical management of chronic diseases.    Has daily morning coughing, sneezing, congestion, then it dissipates by noon.    Constipated and took miralax 3-4 times per week.  Sometimes doesn't know she's going when she takes the miralax.  Was told she has an extra long colon.  She's been constipated all of her life.  Prunes and stool softeners have not really helped her.  She does eat fruit cups, grapes, bananas.  Menu not as good lately.    She has a small ball in her right lower quadrant--one in left too.  It will be hard.  Sometimes it hurts on right, sometimes not.  She had her tubes tied many years ago.  She does notice the area on the right more since she's been constipated.  No melena or hematochezia.  Stool is dark.  Does have hemorrhoids.  They don't hurt or itch.    She does feel more depressed b/c she's not out talking to people.  She does feel better after a CNA comes in and gets talking to her.    She's been too shaky and didn't have the supplies to do some of her crafts.  She wants to knit a blanket for a great grandbaby in April. That will be her 3rd great grand--oldest is 4.  Has 10 grandchildren.  She is doing  monthly facetime with her kids.  Youngest son in [place] calls her weekly.     Past Medical History:  Diagnosis Date  . Allergic rhinitis   . Anemia of renal disease 10/21/2011  . Anemia, iron deficiency 10/21/2011  . Cholelithiasis   . Chronic diastolic heart failure ([place])   . Colon polyp   . Constipation   . Cough    2nd to reflux  . Debility, unspecified   . Dehydration   . Depression   . Diaphragm dysfunction    Right hemidiaphragm  . Disorder of bone and cartilage, unspecified   . Diverticulosis of colon   . Dysphagia   . GERD (gastroesophageal reflux disease) 02/08/2013  . History of hyperkalemia in setting of spironolactone 09/2010  . Hyperlipidemia   . Hypertension   . Hypopotassemia   . Hypothyroidism 02/08/2013  . Long term (current) use of anticoagulants   . MDS (myelodysplastic syndrome), low grade ([place]) 10/21/2011  . Obesity   . Obesity hypoventilation syndrome ([place])   . OSA (obstructive sleep apnea)   . Osteoarthrosis, unspecified whether generalized or localized, unspecified site   . Other specified disease of white blood cells   . Pacemaker    Implanted December 2012  . Paroxysmal A-fib (HCC) with RVR   Amiodarone  . Reflux esophagitis   .  Rosacea 05/15/2013  . Senile cataract   . Sinus node dysfunction (HCC)   . Tracheobronchomalacia   . Unspecified vitamin D deficiency   . Urinary incontinence   . Xerostomia     Past Surgical History:  Procedure Laterality Date  . CATARACT EXTRACTION  2008  . PACEMAKER PLACEMENT  10/21/2010   STJ, dural chamber Dr. [name]  . TOTAL KNEE ARTHROPLASTY  1999   right knee  . TOTAL KNEE ARTHROPLASTY  2003   left knee  . TUBAL LIGATION  1964  . Tummy tuck  2000   pt "almost died"; respiratory distress, became delrious    Allergies  Allergen Reactions  . Codeine     Extreme lethargy  . Iron     By infusion  . Morphine   . Toviaz [Fesoterodine Fumarate]     Outpatient Encounter Medications as of 04/10/2019   Medication Sig  . acetaminophen (TYLENOL) 325 MG tablet Take 650 mg by mouth every 4 (four) hours as needed.   Marland Kitchen allopurinol (ZYLOPRIM) 100 MG tablet Take 100 mg by mouth daily.  Marland Kitchen ezetimibe (ZETIA) 10 MG tablet Take 10 mg by mouth daily.  . fluticasone (FLONASE) 50 MCG/ACT nasal spray Place 2 sprays into the nose 2 (two) times daily. 2 sprays in each nostril  . levothyroxine (SYNTHROID, LEVOTHROID) 75 MCG tablet Take 75 mcg by mouth daily before breakfast.   . polyethylene glycol powder (MIRALAX) powder Take 17 g by mouth as needed for mild constipation.   . potassium chloride SA (K-DUR,KLOR-CON) 20 MEQ tablet Take 40 mEq by mouth daily.  . pseudoephedrine-guaifenesin (MUCINEX D) 60-600 MG 12 hr tablet Take 1 tablet by mouth as needed for congestion.   . Rivaroxaban (XARELTO) 15 MG TABS tablet Take 1 tablet (15 mg total) by mouth daily with supper.  . sertraline (ZOLOFT) 25 MG tablet Take 25 mg by mouth daily.  Marland Kitchen torsemide (DEMADEX) 20 MG tablet Take 40 mg by mouth daily.   No facility-administered encounter medications on file as of 04/10/2019.     Review of Systems:  Review of Systems  Constitutional: Negative for chills and fever.  HENT: Positive for congestion. Negative for sore throat.   Eyes: Negative for blurred vision.  Respiratory: Positive for cough and sputum production. Negative for shortness of breath.        Early ams and clears up by lunch  Cardiovascular: Positive for chest pain and palpitations. Negative for leg swelling.       Had right before pacemaker check which was normal   Gastrointestinal: Positive for abdominal pain and constipation. Negative for blood in stool, diarrhea, heartburn, melena, nausea and vomiting.  Genitourinary: Negative for dysuria.  Musculoskeletal: Negative for falls and joint pain.       Some pain and discoloration persists from prior left leg injury  Skin: Negative for itching and rash.  Neurological: Negative for dizziness and loss of  consciousness.  Endo/Heme/Allergies: Positive for environmental allergies.  Psychiatric/Behavioral: Positive for depression. Negative for memory loss. The patient is not nervous/anxious and does not have insomnia.        Had neighbor who was either lost or sleepwalking come into her room just as she was falling asleep and thought it was a ghost at first    Health Maintenance  Topic Date Due  . INFLUENZA VACCINE  05/04/2019  . TETANUS/TDAP  07/07/2025  . DEXA SCAN  Completed  . PNA vac Low Risk Adult  Completed    Physical Exam: Vitals:  04/10/19 1126  BP: 138/80  Pulse: 79  Temp: 98.8 F (37.1 C)  TempSrc: Oral  SpO2: 97%  Weight: 205 lb (93 kg)  Height: 5\' 4"  (1.626 m)   Body mass index is 35.19 kg/m. Physical Exam Vitals signs reviewed.  Constitutional:      General: She is not in acute distress.    Appearance: Normal appearance. She is obese. She is not ill-appearing or toxic-appearing.  HENT:     Head: Normocephalic and atraumatic.  Cardiovascular:     Rate and Rhythm: Normal rate and regular rhythm.     Pulses: Normal pulses.     Heart sounds: Normal heart sounds.  Pulmonary:     Effort: Pulmonary effort is normal.     Breath sounds: Normal breath sounds.  Abdominal:     General: Bowel sounds are normal. There is no distension.     Palpations: Abdomen is soft. There is mass.     Tenderness: There is abdominal tenderness. There is no guarding or rebound.     Comments: RLQ hard mass a bit larger than a golf ball palpable; LLQ slight hard area present which seems more superficial   Musculoskeletal: Normal range of motion.        General: No swelling.  Skin:    General: Skin is warm and dry.     Coloration: Skin is pale.  Neurological:     General: No focal deficit present.     Mental Status: She is alert and oriented to person, place, and time. Mental status is at baseline.     Cranial Nerves: No cranial nerve deficit.     Motor: No weakness.     Gait: Gait  (came down in power scooter today) normal.  Psychiatric:        Mood and Affect: Mood normal.        Behavior: Behavior normal.        Thought Content: Thought content normal.        Judgment: Judgment normal.     Labs reviewed: Basic Metabolic Panel: No results for input(s): NA, K, CL, CO2, GLUCOSE, BUN, CREATININE, CALCIUM, MG, PHOS, TSH in the last 8760 hours. Liver Function Tests: No results for input(s): AST, ALT, ALKPHOS, BILITOT, PROT, ALBUMIN in the last 8760 hours. No results for input(s): LIPASE, AMYLASE in the last 8760 hours. No results for input(s): AMMONIA in the last 8760 hours. CBC: Recent Labs    09/13/18 0300  WBC 7.4  HGB 10.9*  HCT 33*  PLT 169   Lipid Panel: No results for input(s): CHOL, HDL, LDLCALC, TRIG, CHOLHDL, LDLDIRECT in the last 8760 hours. Lab Results  Component Value Date   HGBA1C 5.5 09/06/2016    Procedures since last visit: No results found.  Assessment/Plan 1. Right lower quadrant abdominal mass -check KUB to determine if it's stool -if no stool seen there or another finding noted (apple core or other lesion), will need CT abd/pelvis -appetite not as good but says food selections aren't as good either -weight is up not down  2. Chronic idiopathic constipation -continue miralax use -did not want to try stool softener again when discussed -hoping that stool is what I feel on her exam in the RLQ and slightly in LLQ -area tender at times  3. MDS (myelodysplastic syndrome), low grade ([place]) -f/u cbc before next visit  4. Pressure injury of right buttock, unstageable (HCC) -not currently present-seems to be more abrasion than pressure injury and comes on during bouts of  constipation  5. Other pancytopenia ([place]) -f/u cbc before next visit--related to #3  6. Recurrent major depression in partial remission ([place]) -spirits are not so good amid covid-19--she's a very social person and missed meals with friends  -does feel better after  talking with others like when CNAs check on her in the daytime  7. Chronic respiratory failure with hypoxia (HCC) -no longer using regular oxygen, just prn  Labs/tests ordered:  * No order type specified * Next appt:  Visit date not found  Jermain Curt L. Jayda White, D.O. [facility] [address], [place] [address] Cell Phone (Mon-Fri 8am-5pm):  [phone] On Call:  [phone] & follow prompts after 5pm & weekends Office Phone:  [phone] Office Fax:  [phone]

## 2019-04-11 ENCOUNTER — Other Ambulatory Visit: Payer: Self-pay | Admitting: Internal Medicine

## 2019-04-11 ENCOUNTER — Encounter: Payer: Self-pay | Admitting: Internal Medicine

## 2019-04-11 DIAGNOSIS — IMO0002 Reserved for concepts with insufficient information to code with codable children: Secondary | ICD-10-CM

## 2019-04-11 DIAGNOSIS — I480 Paroxysmal atrial fibrillation: Secondary | ICD-10-CM | POA: Diagnosis not present

## 2019-04-11 DIAGNOSIS — E785 Hyperlipidemia, unspecified: Secondary | ICD-10-CM | POA: Diagnosis not present

## 2019-04-11 DIAGNOSIS — D649 Anemia, unspecified: Secondary | ICD-10-CM | POA: Diagnosis not present

## 2019-04-11 DIAGNOSIS — E039 Hypothyroidism, unspecified: Secondary | ICD-10-CM | POA: Diagnosis not present

## 2019-04-18 ENCOUNTER — Ambulatory Visit
Admission: RE | Admit: 2019-04-18 | Discharge: 2019-04-18 | Disposition: A | Discharge status: Home / Self Care | Payer: Medicare Other | Source: Ambulatory Visit | Attending: Internal Medicine | Admitting: Internal Medicine

## 2019-04-18 DIAGNOSIS — N2889 Other specified disorders of kidney and ureter: Secondary | ICD-10-CM | POA: Diagnosis not present

## 2019-04-18 DIAGNOSIS — K8020 Calculus of gallbladder without cholecystitis without obstruction: Secondary | ICD-10-CM | POA: Diagnosis not present

## 2019-04-18 DIAGNOSIS — K5730 Diverticulosis of large intestine without perforation or abscess without bleeding: Secondary | ICD-10-CM | POA: Diagnosis not present

## 2019-04-18 DIAGNOSIS — IMO0002 Reserved for concepts with insufficient information to code with codable children: Secondary | ICD-10-CM

## 2019-04-19 ENCOUNTER — Non-Acute Institutional Stay: Payer: Medicare Other | Admitting: Adult Health

## 2019-04-19 ENCOUNTER — Encounter: Payer: Self-pay | Admitting: Adult Health

## 2019-04-19 DIAGNOSIS — R1903 Right lower quadrant abdominal swelling, mass and lump: Secondary | ICD-10-CM

## 2019-04-19 DIAGNOSIS — R1900 Intra-abdominal and pelvic swelling, mass and lump, unspecified site: Secondary | ICD-10-CM | POA: Diagnosis not present

## 2019-04-19 NOTE — Progress Notes (Signed)
Location:  Occupational psychologist of Service:  ALF (13) Provider:   Cindi Carbon, ANP [place] [phone]  Gayland Curry, DO  Patient Care Team: Gayland Curry, DO as PCP - General (Geriatric Medicine) Mardene Celeste, NP as Nurse Practitioner (Geriatric Medicine) [facility] Nance Pear, MD as Consulting Physician (Pulmonary Disease) Larey Dresser, MD as Consulting Physician (Cardiology) Lafayette Dragon, MD (Inactive) as Consulting Physician (Gastroenterology) Volanda Napoleon, MD as Consulting Physician (Oncology) Ninetta Lights, MD as Consulting Physician (Orthopedic Surgery) Sydnee Levans, MD as Consulting Physician (Dermatology) Prentiss Bells, MD as Consulting Physician (Ophthalmology)  Extended Emergency Contact Information Primary Emergency Contact: Gooding,Leslie Address: [place]          [place], [place] Phone: [phone] Relation: Daughter  Code Status:  DNR Goals of care: Advanced Directive information Advanced Directives 03/07/2018  Does Patient Have a Medical Advance Directive? Yes  Type of Paramedic of [place];Out of facility DNR (pink MOST or yellow form)  Does patient want to make changes to medical advance directive? No - Patient declined  Copy of [place] in Chart? Yes  Pre-existing out of facility DNR order (yellow form or pink MOST form) Yellow form placed in chart (order not valid for inpatient use)     Chief Complaint  Patient presents with   Acute Visit    RLQ mass    HPI:  Pt is a 85 y.o. female seen today for an acute visit for f/u regarding a RLQ mass. She presented with this in the clinic with Dr. [name]. On 7/8.  She is not having any pain, GI issues, or weight loss. An abd xray was ordered which showed a 5 cm tissue density. A CT was ordered with oral contrast which she did consume on  04/18/19 which showed rounded masslike thickening of the right external oblique musculature but the area was not completely visualized.   Past Medical History:  Diagnosis Date   Allergic rhinitis    Anemia of renal disease 10/21/2011   Anemia, iron deficiency 10/21/2011   Cholelithiasis    Chronic diastolic heart failure (HCC)    Colon polyp    Constipation    Cough    2nd to reflux   Debility, unspecified    Dehydration    Depression    Diaphragm dysfunction    Right hemidiaphragm   Disorder of bone and cartilage, unspecified    Diverticulosis of colon    Dysphagia    GERD (gastroesophageal reflux disease) 02/08/2013   History of hyperkalemia in setting of spironolactone 09/2010   Hyperlipidemia    Hypertension    Hypopotassemia    Hypothyroidism 02/08/2013   Long term (current) use of anticoagulants    MDS (myelodysplastic syndrome), low grade ([place]) 10/21/2011   Obesity    Obesity hypoventilation syndrome (HCC)    OSA (obstructive sleep apnea)    Osteoarthrosis, unspecified whether generalized or localized, unspecified site    Other specified disease of white blood cells    Pacemaker    Implanted December 2012   Paroxysmal A-fib [facility]) with RVR   Amiodarone   Reflux esophagitis    Rosacea 05/15/2013   Senile cataract    Sinus node dysfunction (HCC)    Tracheobronchomalacia    Unspecified vitamin D deficiency    Urinary incontinence    Xerostomia    Past Surgical History:  Procedure Laterality Date  CATARACT EXTRACTION  2006-12-27   PACEMAKER PLACEMENT  10/21/2010   STJ, dural chamber Dr. [name]   TOTAL KNEE ARTHROPLASTY  1999   right knee   TOTAL KNEE ARTHROPLASTY  2001-12-26   left knee   TUBAL LIGATION  1962-12-27   Tummy tuck  12-27-1998   pt "almost died"; respiratory distress, became delrious    Allergies  Allergen Reactions   Codeine     Extreme lethargy   Iron     By infusion   Morphine    Toviaz [Fesoterodine Fumarate]      Outpatient Encounter Medications as of 04/19/2019  Medication Sig   acetaminophen (TYLENOL) 325 MG tablet Take 650 mg by mouth every 4 (four) hours as needed.    allopurinol (ZYLOPRIM) 100 MG tablet Take 100 mg by mouth daily.   ezetimibe (ZETIA) 10 MG tablet Take 10 mg by mouth daily.   fluticasone (FLONASE) 50 MCG/ACT nasal spray Place 2 sprays into the nose 2 (two) times daily. 2 sprays in each nostril   levothyroxine (SYNTHROID, LEVOTHROID) 75 MCG tablet Take 75 mcg by mouth daily before breakfast.    polyethylene glycol powder (MIRALAX) powder Take 17 g by mouth as needed for mild constipation.    potassium chloride SA (K-DUR,KLOR-CON) 20 MEQ tablet Take 40 mEq by mouth daily.   Rivaroxaban (XARELTO) 15 MG TABS tablet Take 1 tablet (15 mg total) by mouth daily with supper.   sertraline (ZOLOFT) 25 MG tablet Take 25 mg by mouth daily.   torsemide (DEMADEX) 20 MG tablet Take 40 mg by mouth daily.   [DISCONTINUED] pseudoephedrine-guaifenesin (MUCINEX D) 60-600 MG 12 hr tablet Take 1 tablet by mouth as needed for congestion.    No facility-administered encounter medications on file as of 04/19/2019.     Review of Systems  Constitutional: Negative for activity change, appetite change, chills, diaphoresis, fatigue, fever and unexpected weight change.  Gastrointestinal: Negative for abdominal distention, abdominal pain, constipation, diarrhea, nausea and rectal pain.    Immunization History  Administered Date(s) Administered   Influenza Inj Mdck Quad Pf 07/21/2016   Influenza Split 07/08/2011   Influenza Whole 07/06/2009, 07/03/2012, 07/16/2013   Influenza,inj,Quad PF,6+ Mos 07/24/2018   Influenza-Unspecified 07/08/2014, 07/16/2015, 07/27/2017   PPD Test 11/02/2011   Pneumococcal Conjugate-13 01/13/2015   Pneumococcal Polysaccharide-23 10/27/2010   Tdap 07/08/2015   Zoster Recombinat (Shingrix) 10/11/2017, 01/05/2018   Pertinent  Health Maintenance Due   Topic Date Due   INFLUENZA VACCINE  05/04/2019   DEXA SCAN  Completed   PNA vac Low Risk Adult  Completed   Fall Risk  04/10/2019 09/12/2018 07/31/2018 03/21/2018 03/07/2018  Falls in the past year? 0 1 No No No  Comment - - - - -  Number falls in past yr: 0 0 - - -  Injury with Fall? 0 0 - - -  Risk for fall due to : - History of fall(s) - - -  Follow up - Education provided - - -   Functional Status Survey:    There were no vitals filed for this visit. There is no height or weight on file to calculate BMI. Physical Exam Vitals signs and nursing note reviewed.  Constitutional:      Appearance: Normal appearance.  Abdominal:     General: Bowel sounds are normal. There is no distension.     Palpations: Abdomen is soft. There is mass (RLQ not tender or red.  ).     Tenderness: There is no abdominal tenderness. There  is no left CVA tenderness, guarding or rebound.     Hernia: No hernia is present.  Neurological:     General: No focal deficit present.     Mental Status: She is alert and oriented to person, place, and time.     Labs reviewed: No results for input(s): NA, K, CL, CO2, GLUCOSE, BUN, CREATININE, CALCIUM, MG, PHOS in the last 8760 hours. No results for input(s): AST, ALT, ALKPHOS, BILITOT, PROT, ALBUMIN in the last 8760 hours. Recent Labs    09/13/18 0300  WBC 7.4  HGB 10.9*  HCT 33*  PLT 169   Lab Results  Component Value Date   TSH 3.65 03/06/2018   Lab Results  Component Value Date   HGBA1C 5.5 09/06/2016   Lab Results  Component Value Date   CHOL 201 (A) 03/06/2017   HDL 57 03/06/2017   LDLCALC 129 03/06/2017   TRIG 73 03/06/2017   CHOLHDL 2.2 09/08/2010    Significant Diagnostic Results in last 30 days:  Ct Abdomen Wo Contrast  Result Date: 04/18/2019 CLINICAL DATA:  Abdominal pain, right flank pain beginning above the iliac crest radiating inferiorly. Chronic constipation. EXAM: CT ABDOMEN WITHOUT CONTRAST TECHNIQUE: Multidetector CT  imaging of the abdomen was performed following the standard protocol without IV contrast. COMPARISON:  None. FINDINGS: Lower chest: Elevation of the right hemidiaphragm. Basilar areas of atelectasis and/or scarring adjacent to the elevated right hemidiaphragm. Normal heart size. No pericardial effusion. Right atrial and septal pacer leads are noted. Atherosclerotic calcification of the coronary arteries. Aortic leaflet calcifications are present. Hepatobiliary: Atypical configuration of the liver parenchyma likely related to a right hemidiaphragmatic elevation. The gallbladder contains a multitude of partially calcified gallstones. No pericholecystic inflammation. No biliary ductal dilatation. Pancreas: Partial fatty replacement of the pancreas. Otherwise unremarkable. No pancreatic ductal dilatation or surrounding inflammatory changes. Spleen: Normal in size without focal abnormality. Adrenals/Urinary Tract: Adrenal glands are unremarkable. The kidneys demonstrate bilateral symmetric mild perinephric stranding. Scattered fluid cortical exophytic renal cysts are present. No concerning renal lesions. No urolithiasis or visualized stones in the proximal collecting system. Stomach/Bowel: Distal esophagus is normal. Smooth pyloric contraction. Incidental note made of intestinal malrotation as the duodenum fails to cross the midline abdomen by the ligament Treitz. No resulting obstruction. No bowel dilatation or wall thickening. Normal appearance of the cecum. A normal appendix is visualized. Scattered colonic diverticula without focal pericolonic inflammation to suggest diverticulitis. Visualized. Vascular/Lymphatic: Atherosclerotic plaque within the normal caliber aorta. No suspicious or enlarged lymph nodes in the included lymphatic chains. Other: No abdominopelvic free fluid or free gas. No bowel containing hernias. Mild flank edema. Musculoskeletal: Multilevel degenerative changes are present in the imaged portions  of the spine. No acute osseous abnormality or suspicious osseous lesion. There is a rounded thickening the right external oblique musculature measuring approximately 5.3 cm in diameter. This is incompletely evaluated on noncontrast enhanced study. IMPRESSION: Rounded masslike thickening of the right external oblique musculature, incompletely evaluated on this noncontrast exam. While this could reflect atypical muscle contraction, underlying lesion is not excluded. Consider further evaluation with soft tissue ultrasound or MRI. Incidental note made of intestinal malrotation without resulting obstruction. Cholelithiasis without evidence of acute cholecystitis. Diverticulosis without evidence of diverticulitis. Mild nonspecific perinephric stranding, correlate with renal function. These results will be called to the ordering clinician or representative by the Radiologist Assistant, and communication documented in the PACS or zVision Dashboard. Electronically Signed   By: Lovena Le M.D.   On: 04/18/2019 23:31  Assessment/Plan 1. RLQ abdominal mass She continues to be asymptomatic. She would like to perform an in house ultrasound rather than an MRI at this time to further visualization this area.  She does not feel she would pursue aggressive treatment if there were a cancerous lesion. This area may be an area of musculature that is protruding due to her previous tummy tuck. We will proceed with the abd ultrasound for further evaluation.     Family/ staff Communication: discussed with pt and Dr. [name]   Labs/tests ordered:  abd ultrasound

## 2019-04-29 ENCOUNTER — Non-Acute Institutional Stay: Payer: Medicare Other | Admitting: Adult Health

## 2019-04-29 ENCOUNTER — Encounter: Payer: Self-pay | Admitting: Adult Health

## 2019-04-29 DIAGNOSIS — Z0000 Encounter for general adult medical examination without abnormal findings: Secondary | ICD-10-CM | POA: Diagnosis not present

## 2019-04-29 NOTE — Patient Instructions (Signed)
[name] , Thank you for taking time to come for your Medicare Wellness Visit. I appreciate your ongoing commitment to your health goals. Please review the following plan we discussed and let me know if I can assist you in the future.   Screening recommendations/referrals: Colonoscopy aged out Mammogram aged out Bone Density declined Recommended yearly ophthalmology/optometry visit for glaucoma screening and checkup Recommended yearly dental visit for hygiene and checkup  Vaccinations: Influenza vaccine up to date Pneumococcal vaccine up to date Tdap vaccine up to date Shingles vaccine up to date    Advanced directives: reviewed  Conditions/risks identified: cardiac  Next appointment: 1 year    Preventive Care 83 Years and Older, Female Preventive care refers to lifestyle choices and visits with your health care provider that can promote health and wellness. What does preventive care include?  A yearly physical exam. This is also called an annual well check.  Dental exams once or twice a year.  Routine eye exams. Ask your health care provider how often you should have your eyes checked.  Personal lifestyle choices, including:  Daily care of your teeth and gums.  Regular physical activity.  Eating a healthy diet.  Avoiding tobacco and drug use.  Limiting alcohol use.  Practicing safe sex.  Taking low-dose aspirin every day.  Taking vitamin and mineral supplements as recommended by your health care provider. What happens during an annual well check? The services and screenings done by your health care provider during your annual well check will depend on your age, overall health, lifestyle risk factors, and family history of disease. Counseling  Your health care provider may ask you questions about your:  Alcohol use.  Tobacco use.  Drug use.  Emotional well-being.  Home and relationship well-being.  Sexual activity.  Eating habits.  History of  falls.  Memory and ability to understand (cognition).  Work and work Statistician.  Reproductive health. Screening  You may have the following tests or measurements:  Height, weight, and BMI.  Blood pressure.  Lipid and cholesterol levels. These may be checked every 5 years, or more frequently if you are over 70 years old.  Skin check.  Lung cancer screening. You may have this screening every year starting at age 7 if you have a 30-pack-year history of smoking and currently smoke or have quit within the past 15 years.  Fecal occult blood test (FOBT) of the stool. You may have this test every year starting at age 62.  Flexible sigmoidoscopy or colonoscopy. You may have a sigmoidoscopy every 5 years or a colonoscopy every 10 years starting at age 52.  Hepatitis C blood test.  Hepatitis B blood test.  Sexually transmitted disease (STD) testing.  Diabetes screening. This is done by checking your blood sugar (glucose) after you have not eaten for a while (fasting). You may have this done every 1-3 years.  Bone density scan. This is done to screen for osteoporosis. You may have this done starting at age 67.  Mammogram. This may be done every 1-2 years. Talk to your health care provider about how often you should have regular mammograms. Talk with your health care provider about your test results, treatment options, and if necessary, the need for more tests. Vaccines  Your health care provider may recommend certain vaccines, such as:  Influenza vaccine. This is recommended every year.  Tetanus, diphtheria, and acellular pertussis (Tdap, Td) vaccine. You may need a Td booster every 10 years.  Zoster vaccine. You may  need this after age 10.  Pneumococcal 13-valent conjugate (PCV13) vaccine. One dose is recommended after age 33.  Pneumococcal polysaccharide (PPSV23) vaccine. One dose is recommended after age 56. Talk to your health care provider about which screenings and  vaccines you need and how often you need them. This information is not intended to replace advice given to you by your health care provider. Make sure you discuss any questions you have with your health care provider. Document Released: 10/16/2015 Document Revised: 06/08/2016 Document Reviewed: 07/21/2015 Elsevier Interactive Patient Education  2017 [employer] Prevention in the Home Falls can cause injuries. They can happen to people of all ages. There are many things you can do to make your home safe and to help prevent falls. What can I do on the outside of my home?  Regularly fix the edges of walkways and driveways and fix any cracks.  Remove anything that might make you trip as you walk through a door, such as a raised step or threshold.  Trim any bushes or trees on the path to your home.  Use bright outdoor lighting.  Clear any walking paths of anything that might make someone trip, such as rocks or tools.  Regularly check to see if handrails are loose or broken. Make sure that both sides of any steps have handrails.  Any raised decks and porches should have guardrails on the edges.  Have any leaves, snow, or ice cleared regularly.  Use sand or salt on walking paths during winter.  Clean up any spills in your garage right away. This includes oil or grease spills. What can I do in the bathroom?  Use night lights.  Install grab bars by the toilet and in the tub and shower. Do not use towel bars as grab bars.  Use non-skid mats or decals in the tub or shower.  If you need to sit down in the shower, use a plastic, non-slip stool.  Keep the floor dry. Clean up any water that spills on the floor as soon as it happens.  Remove soap buildup in the tub or shower regularly.  Attach bath mats securely with double-sided non-slip rug tape.  Do not have throw rugs and other things on the floor that can make you trip. What can I do in the bedroom?  Use night lights.   Make sure that you have a light by your bed that is easy to reach.  Do not use any sheets or blankets that are too big for your bed. They should not hang down onto the floor.  Have a firm chair that has side arms. You can use this for support while you get dressed.  Do not have throw rugs and other things on the floor that can make you trip. What can I do in the kitchen?  Clean up any spills right away.  Avoid walking on wet floors.  Keep items that you use a lot in easy-to-reach places.  If you need to reach something above you, use a strong step stool that has a grab bar.  Keep electrical cords out of the way.  Do not use floor polish or wax that makes floors slippery. If you must use wax, use non-skid floor wax.  Do not have throw rugs and other things on the floor that can make you trip. What can I do with my stairs?  Do not leave any items on the stairs.  Make sure that there are handrails on both sides  of the stairs and use them. Fix handrails that are broken or loose. Make sure that handrails are as long as the stairways.  Check any carpeting to make sure that it is firmly attached to the stairs. Fix any carpet that is loose or worn.  Avoid having throw rugs at the top or bottom of the stairs. If you do have throw rugs, attach them to the floor with carpet tape.  Make sure that you have a light switch at the top of the stairs and the bottom of the stairs. If you do not have them, ask someone to add them for you. What else can I do to help prevent falls?  Wear shoes that:  Do not have high heels.  Have rubber bottoms.  Are comfortable and fit you well.  Are closed at the toe. Do not wear sandals.  If you use a stepladder:  Make sure that it is fully opened. Do not climb a closed stepladder.  Make sure that both sides of the stepladder are locked into place.  Ask someone to hold it for you, if possible.  Clearly mark and make sure that you can see:  Any  grab bars or handrails.  First and last steps.  Where the edge of each step is.  Use tools that help you move around (mobility aids) if they are needed. These include:  Canes.  Walkers.  Scooters.  Crutches.  Turn on the lights when you go into a dark area. Replace any light bulbs as soon as they burn out.  Set up your furniture so you have a clear path. Avoid moving your furniture around.  If any of your floors are uneven, fix them.  If there are any pets around you, be aware of where they are.  Review your medicines with your doctor. Some medicines can make you feel dizzy. This can increase your chance of falling. Ask your doctor what other things that you can do to help prevent falls. This information is not intended to replace advice given to you by your health care provider. Make sure you discuss any questions you have with your health care provider. Document Released: 07/16/2009 Document Revised: 02/25/2016 Document Reviewed: 10/24/2014 Elsevier Interactive Patient Education  2017 [employer].

## 2019-04-29 NOTE — Progress Notes (Addendum)
Subjective:   Cindy Cervantes is a 85 y.o. female who presents for Medicare Annual (Subsequent) preventive examination.  Review of Systems:   Cardiac Risk Factors include: advanced age (>33men, >61 women);obesity (BMI >30kg/m2);sedentary lifestyle;hypertension     Objective:     Vitals: Wt 200 lb (90.7 kg)   BMI 34.33 kg/m   Body mass index is 34.33 kg/m.  Advanced Directives 04/29/2019 03/07/2018 12/13/2017 11/14/2017 09/06/2017 03/08/2017 01/11/2017  Does Patient Have a Medical Advance Directive? Yes Yes Yes Yes Yes Yes Yes  Type of Paramedic of [place];Living will;Out of facility DNR (pink MOST or yellow form) [place];Out of facility DNR (pink MOST or yellow form) Out of facility DNR (pink MOST or yellow form);Healthcare Power of [place];Out of facility DNR (pink MOST or yellow form) Out of facility DNR (pink MOST or yellow form);Healthcare Power of [place];Out of facility DNR (pink MOST or yellow form) Healthcare Power of Attorney  Does patient want to make changes to medical advance directive? No - Patient declined No - Patient declined No - Patient declined No - Patient declined No - Patient declined - -  Copy of [place] in Chart? Yes - validated most recent copy scanned in chart (See row information) Yes Yes Yes Yes Yes Yes  Pre-existing out of facility DNR order (yellow form or pink MOST form) - Yellow form placed in chart (order not valid for inpatient use) Yellow form placed in chart (order not valid for inpatient use) Yellow form placed in chart (order not valid for inpatient use) Yellow form placed in chart (order not valid for inpatient use) Yellow form placed in chart (order not valid for inpatient use) -    Tobacco Social History   Tobacco Use  Smoking Status Former Smoker  . Packs/day: 0.50  . Types: Cigarettes  . Quit date: 10/03/1982  . Years  since quitting: 36.6  Smokeless Tobacco Never Used     Counseling given: Not Answered   Clinical Intake:                       Past Medical History:  Diagnosis Date  . Allergic rhinitis   . Anemia of renal disease 10/21/2011  . Anemia, iron deficiency 10/21/2011  . Cholelithiasis   . Chronic diastolic heart failure ([place])   . Colon polyp   . Constipation   . Cough    2nd to reflux  . Debility, unspecified   . Dehydration   . Depression   . Diaphragm dysfunction    Right hemidiaphragm  . Disorder of bone and cartilage, unspecified   . Diverticulosis of colon   . Dysphagia   . GERD (gastroesophageal reflux disease) 02/08/2013  . History of hyperkalemia in setting of spironolactone 09/2010  . Hyperlipidemia   . Hypertension   . Hypopotassemia   . Hypothyroidism 02/08/2013  . Long term (current) use of anticoagulants   . MDS (myelodysplastic syndrome), low grade ([place]) 10/21/2011  . Obesity   . Obesity hypoventilation syndrome ([place])   . OSA (obstructive sleep apnea)   . Osteoarthrosis, unspecified whether generalized or localized, unspecified site   . Other specified disease of white blood cells   . Pacemaker    Implanted December 2012  . Paroxysmal A-fib (HCC) with RVR   Amiodarone  . Reflux esophagitis   . Rosacea 05/15/2013  . Senile cataract   . Sinus node  dysfunction ([place])   . Tracheobronchomalacia   . Unspecified vitamin D deficiency   . Urinary incontinence   . Xerostomia    Past Surgical History:  Procedure Laterality Date  . CATARACT EXTRACTION  2008  . PACEMAKER PLACEMENT  10/21/2010   STJ, dural chamber Dr. [name]  . TOTAL KNEE ARTHROPLASTY  1999   right knee  . TOTAL KNEE ARTHROPLASTY  2003   left knee  . TUBAL LIGATION  1964  . Tummy tuck  2000   pt "almost died"; respiratory distress, became delrious   Family History  Problem Relation Age of Onset  . Heart disease Father   . Parkinson's disease Sister   . Heart disease Brother   .  Parkinson's disease Brother    Social History   Socioeconomic History  . Marital status: Widowed    Spouse name: Not on file  . Number of children: Not on file  . Years of education: Not on file  . Highest education level: Not on file  Occupational History  . Occupation: retired    Fish farm manager: RETIRED    Comment: Network engineer   Social Needs  . Financial resource strain: Not hard at all  . Food insecurity    Worry: Never true    Inability: Never true  . Transportation needs    Medical: No    Non-medical: No  Tobacco Use  . Smoking status: Former Smoker    Packs/day: 0.50    Types: Cigarettes    Quit date: 10/03/1982    Years since quitting: 36.6  . Smokeless tobacco: Never Used  Substance and Sexual Activity  . Alcohol use: Yes    Comment: very rarely.  none in one year  . Drug use: No  . Sexual activity: Never  Lifestyle  . Physical activity    Days per week: 0 days    Minutes per session: 0 min  . Stress: Only a little  Relationships  . Social Herbalist on phone: Three times a week    Gets together: Three times a week    Attends religious service: Never    Active member of club or organization: No    Attends meetings of clubs or organizations: Never    Relationship status: Widowed  Other Topics Concern  . Not on file  Social History Narrative   Widowed 2002 (married 1955), originally form [place]. 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 Moved from [place], [place] to [place], [place] section 2009. Moved to [facility]isted Living section 2012.    Non smoker, mininmal alcohol.    Has POA   Power wheelchair, walker   Exercise none                Outpatient Encounter Medications as of 04/29/2019  Medication Sig  . acetaminophen (TYLENOL) 325 MG tablet Take 650 mg by mouth every 4 (four) hours as needed.   Marland Kitchen allopurinol (ZYLOPRIM) 100 MG tablet Take 100 mg by mouth daily.  Marland Kitchen ezetimibe (ZETIA)  10 MG tablet Take 10 mg by mouth daily.  . fluticasone (FLONASE) 50 MCG/ACT nasal spray Place 2 sprays into the nose 2 (two) times daily. 2 sprays in each nostril  . levothyroxine (SYNTHROID, LEVOTHROID) 75 MCG tablet Take 75 mcg by mouth daily before breakfast.   . polyethylene glycol powder (MIRALAX) powder Take 17 g by mouth as needed for mild constipation.   . potassium chloride SA (K-DUR,KLOR-CON) 20 MEQ tablet Take 40  mEq by mouth daily.  . Rivaroxaban (XARELTO) 15 MG TABS tablet Take 1 tablet (15 mg total) by mouth daily with supper.  . sertraline (ZOLOFT) 25 MG tablet Take 25 mg by mouth daily.  Marland Kitchen torsemide (DEMADEX) 20 MG tablet Take 40 mg by mouth daily.   No facility-administered encounter medications on file as of 04/29/2019.     Activities of Daily Living In your present state of health, do you have any difficulty performing the following activities: 04/29/2019  Hearing? N  Vision? Y  Difficulty concentrating or making decisions? N  Walking or climbing stairs? Y  Dressing or bathing? N  Preparing Food and eating ? Y  Using the Toilet? N  In the past six months, have you accidently leaked urine? Y  Do you have problems with loss of bowel control? N  Managing your Medications? Y  Managing your Finances? Y  Housekeeping or managing your Housekeeping? Y  Some recent data might be hidden    Patient Care Team: Gayland Curry, DO as PCP - General (Geriatric Medicine) Mardene Celeste, NP as Nurse Practitioner (Geriatric Medicine) [facility], Elisabeth Cara, MD as Consulting Physician (Pulmonary Disease) Larey Dresser, MD as Consulting Physician (Cardiology) Lafayette Dragon, MD (Inactive) as Consulting Physician (Gastroenterology) Volanda Napoleon, MD as Consulting Physician (Oncology) Ninetta Lights, MD as Consulting Physician (Orthopedic Surgery) Sydnee Levans, MD as Consulting Physician (Dermatology) Prentiss Bells, MD as Consulting  Physician (Ophthalmology)    Assessment:   This is a routine wellness examination for Shamya.  Exercise Activities and Dietary recommendations Current Exercise Habits: The patient does not participate in regular exercise at present, Exercise limited by: orthopedic condition(s)  Goals    . Exercise 3x per week (30 min per time)       Fall Risk Fall Risk  04/29/2019 04/10/2019 09/12/2018 07/31/2018 03/21/2018  Falls in the past year? 1 0 1 No No  Comment - - - - -  Number falls in past yr: 0 0 0 - -  Injury with Fall? 1 0 0 - -  Risk for fall due to : History of fall(s);Impaired balance/gait - History of fall(s) - -  Follow up Falls evaluation completed;Education provided - Education provided - -   Is the patient's home free of loose throw rugs in walkways, pet beds, electrical cords, etc?   yes      Grab bars in the bathroom? yes      Handrails on the stairs?   yes      Adequate lighting?   yes  Timed Get Up and Go performed: not performed  Depression Screen PHQ 2/9 Scores 04/29/2019 04/10/2019 09/12/2018 03/21/2018  PHQ - 2 Score 4 0 0 0  PHQ- 9 Score 7 - - -     Cognitive Function MMSE - Mini Mental State Exam 04/29/2019 10/19/2017 10/19/2017 11/02/2016 10/29/2015  Orientation to time 5 5 5 5 5   Orientation to Place 5 5 5 5 5   Registration 3 3 3 3 3   Attention/ Calculation 5 5 5 5 5   Recall 3 3 3 3 3   Language- name 2 objects 2 2 2 2 2   Language- repeat 1 1 1 1 1   Language- follow 3 step command 3 3 3 3 3   Language- read & follow direction 1 1 1 1 1   Write a sentence 1 1 1 1 1   Copy design 1 1 1 1 1   Total score 30 30 30 30  30  Immunization History  Administered Date(s) Administered  . Influenza Inj Mdck Quad Pf 07/21/2016  . Influenza Split 07/08/2011  . Influenza Whole 07/06/2009, 07/03/2012, 07/16/2013  . Influenza,inj,Quad PF,6+ Mos 07/24/2018  . Influenza-Unspecified 07/08/2014, 07/16/2015, 07/27/2017  . PPD Test 11/02/2011  . Pneumococcal Conjugate-13  01/13/2015  . Pneumococcal Polysaccharide-23 10/27/2010  . Tdap 07/08/2015  . Zoster Recombinat (Shingrix) 10/11/2017, 01/05/2018    Qualifies for Shingles Vaccine?up to date  Screening Tests Health Maintenance  Topic Date Due  . INFLUENZA VACCINE  05/04/2019  . TETANUS/TDAP  07/07/2025  . DEXA SCAN  Completed  . PNA vac Low Risk Adult  Completed    Cancer Screenings: Lung: Low Dose CT Chest recommended if Age 13-80 years, 30 pack-year currently smoking OR have quit w/in 15years. Patient does not qualify. Breast:  Up to date on Mammogram? No   Up to date of Bone Density/Dexa? No declined Colorectal: aged out   Additional Screenings: not needed  Hepatitis C Screening:      Plan:      I have personally reviewed and noted the following in the patient's chart:   . Medical and social history . Use of alcohol, tobacco or illicit drugs  . Current medications and supplements . Functional ability and status . Nutritional status . Physical activity . Advanced directives . List of other physicians . Hospitalizations, surgeries, and ER visits in previous 12 months . Vitals . Screenings to include cognitive, depression, and falls . Referrals and appointments  In addition, I have reviewed and discussed with patient certain preventive protocols, quality metrics, and best practice recommendations. A written personalized care plan for preventive services as well as general preventive health recommendations were provided to patient.     Royal Hawthorn, NP  05/02/2019

## 2019-05-16 ENCOUNTER — Ambulatory Visit (INDEPENDENT_AMBULATORY_CARE_PROVIDER_SITE_OTHER): Payer: Medicare Other | Admitting: *Deleted

## 2019-05-16 DIAGNOSIS — I495 Sick sinus syndrome: Secondary | ICD-10-CM | POA: Diagnosis not present

## 2019-05-16 LAB — CUP PACEART REMOTE DEVICE CHECK
Battery Remaining Longevity: 43 mo
Battery Remaining Percentage: 35 %
Battery Voltage: 2.83 V
Brady Statistic AP VP Percent: 1.8 %
Brady Statistic AP VS Percent: 50 %
Brady Statistic AS VP Percent: 1 %
Brady Statistic AS VS Percent: 44 %
Brady Statistic RA Percent Paced: 42 %
Brady Statistic RV Percent Paced: 2.4 %
Date Time Interrogation Session: 20200813060014
Implantable Lead Implant Date: 20120119
Implantable Lead Implant Date: 20120119
Implantable Lead Location: 753859
Implantable Lead Location: 753860
Implantable Lead Model: 1948
Implantable Pulse Generator Implant Date: 20120119
Lead Channel Impedance Value: 410 Ohm
Lead Channel Impedance Value: 740 Ohm
Lead Channel Pacing Threshold Amplitude: 0.5 V
Lead Channel Pacing Threshold Pulse Width: 0.5 ms
Lead Channel Sensing Intrinsic Amplitude: 11 mV
Lead Channel Sensing Intrinsic Amplitude: 2.7 mV
Lead Channel Setting Pacing Amplitude: 1.5 V
Lead Channel Setting Pacing Amplitude: 2.5 V
Lead Channel Setting Pacing Pulse Width: 0.5 ms
Lead Channel Setting Sensing Sensitivity: 2 mV
Pulse Gen Model: 2210
Pulse Gen Serial Number: 7198677

## 2019-05-20 ENCOUNTER — Encounter: Payer: Self-pay | Admitting: Internal Medicine

## 2019-05-20 ENCOUNTER — Ambulatory Visit (INDEPENDENT_AMBULATORY_CARE_PROVIDER_SITE_OTHER): Payer: Medicare Other | Admitting: Internal Medicine

## 2019-05-20 ENCOUNTER — Other Ambulatory Visit: Payer: Self-pay

## 2019-05-20 VITALS — BP 134/80 | HR 87 | Ht 64.0 in | Wt 205.0 lb

## 2019-05-20 DIAGNOSIS — I5032 Chronic diastolic (congestive) heart failure: Secondary | ICD-10-CM | POA: Diagnosis not present

## 2019-05-20 DIAGNOSIS — Z950 Presence of cardiac pacemaker: Secondary | ICD-10-CM

## 2019-05-20 DIAGNOSIS — I480 Paroxysmal atrial fibrillation: Secondary | ICD-10-CM | POA: Diagnosis not present

## 2019-05-20 DIAGNOSIS — I493 Ventricular premature depolarization: Secondary | ICD-10-CM | POA: Diagnosis not present

## 2019-05-20 DIAGNOSIS — I495 Sick sinus syndrome: Secondary | ICD-10-CM

## 2019-05-20 LAB — CUP PACEART INCLINIC DEVICE CHECK
Battery Remaining Longevity: 44 mo
Battery Voltage: 2.83 V
Brady Statistic RA Percent Paced: 41 %
Brady Statistic RV Percent Paced: 2.3 %
Date Time Interrogation Session: 20200817145027
Implantable Lead Implant Date: 20120119
Implantable Lead Implant Date: 20120119
Implantable Lead Location: 753859
Implantable Lead Location: 753860
Implantable Lead Model: 1948
Implantable Pulse Generator Implant Date: 20120119
Lead Channel Impedance Value: 412.5 Ohm
Lead Channel Impedance Value: 862.5 Ohm
Lead Channel Pacing Threshold Amplitude: 0.5 V
Lead Channel Pacing Threshold Amplitude: 0.75 V
Lead Channel Pacing Threshold Pulse Width: 0.5 ms
Lead Channel Pacing Threshold Pulse Width: 0.5 ms
Lead Channel Sensing Intrinsic Amplitude: 1.9 mV
Lead Channel Sensing Intrinsic Amplitude: 10.9 mV
Lead Channel Setting Pacing Amplitude: 1.5 V
Lead Channel Setting Pacing Amplitude: 2.5 V
Lead Channel Setting Pacing Pulse Width: 0.5 ms
Lead Channel Setting Sensing Sensitivity: 2 mV
Pulse Gen Model: 2210
Pulse Gen Serial Number: 7198677

## 2019-05-20 NOTE — Patient Instructions (Signed)
Medication Instructions:  Your physician recommends that you continue on your current medications as directed. Please refer to the Current Medication list given to you today.  Labwork: You will have labs drawn today: CBC, BMP, TSH  Testing/Procedures: None ordered.  Follow-Up: Your physician recommends that you schedule a follow-up appointment in:   12 months with Dr. [name]  Any Other Special Instructions Will Be Listed Below (If Applicable).     If you need a refill on your cardiac medications before your next appointment, please call your pharmacy.

## 2019-05-20 NOTE — Progress Notes (Signed)
Maybe as long as calling [place]    Cardiology Office Note Date:  05/20/2019  Patient ID:  Cindy, Cervantes 19-Jan-1932, MRN [id number] PCP:  Gayland Curry, DO  Electrophysiology: Dr. [name] [facility] Chesley Mires, MD as Consulting Physician (Pulmonary Disease) Volanda Napoleon, MD as Consulting Physician (Oncology) Trixie Rude., MD as Consulting Physician (Ophthalmology)   Chief Complaint:  Routine EP, pacer visit  History of Present Illness: Cindy Cervantes is a 85 y.o. female  Seen in followup for her paroxysmal atrial fibrillation with a rapid ventricular response and sinus node dysfunction for which she required pacemaker implantation in the fall of 2011.   T    Date Cr K TSH LFTs Hgb  6/17    3.25 6    12/18 1.6 3.8 3.24 4 10.9  6/19 1.8 4.4 3.65       On anticoagulation  No bleeding  She has significant tremors that has prompted the need to stop sewing.  Neuro thought not Parkinson Tremor--amiodarone stopped there has been a gradually increasing PVC burden, less than 1% 10/19, 2.5% 5/20.   No palps, stable dyspnea no edema or chest pain        Past Medical History:  Diagnosis Date  . Allergic rhinitis   . Anemia of renal disease 10/21/2011  . Anemia, iron deficiency 10/21/2011  . Cholelithiasis   . Chronic diastolic heart failure ([place])   . Colon polyp   . Constipation   . Cough    2nd to reflux  . Debility, unspecified   . Dehydration   . Depression   . Diaphragm dysfunction    Right hemidiaphragm  . Disorder of bone and cartilage, unspecified   . Diverticulosis of colon   . Dysphagia   . GERD (gastroesophageal reflux disease) 02/08/2013  . History of hyperkalemia in setting of spironolactone 09/2010  . Hyperlipidemia   . Hypertension   . Hypopotassemia   . Hypothyroidism 02/08/2013  . Long term (current) use of anticoagulants   . MDS (myelodysplastic syndrome), low grade ([place]) 10/21/2011  . Obesity   . Obesity  hypoventilation syndrome ([place])   . OSA (obstructive sleep apnea)   . Osteoarthrosis, unspecified whether generalized or localized, unspecified site   . Other specified disease of white blood cells   . Pacemaker    Implanted December 2012  . Paroxysmal A-fib (HCC) with RVR   Amiodarone  . Reflux esophagitis   . Rosacea 05/15/2013  . Senile cataract   . Sinus node dysfunction (HCC)   . Tracheobronchomalacia   . Unspecified vitamin D deficiency   . Urinary incontinence   . Xerostomia     Past Surgical History:  Procedure Laterality Date  . CATARACT EXTRACTION  2008  . PACEMAKER PLACEMENT  10/21/2010   STJ, dural chamber Dr. [name]  . TOTAL KNEE ARTHROPLASTY  1999   right knee  . TOTAL KNEE ARTHROPLASTY  2003   left knee  . TUBAL LIGATION  1964  . Tummy tuck  2000   pt "almost died"; respiratory distress, became delrious    Current Outpatient Medications  Medication Sig Dispense Refill  . acetaminophen (TYLENOL) 325 MG tablet Take 650 mg by mouth every 4 (four) hours as needed.     Marland Kitchen allopurinol (ZYLOPRIM) 100 MG tablet Take 100 mg by mouth daily.    Marland Kitchen ezetimibe (ZETIA) 10 MG tablet Take 10 mg by mouth daily.    . fluticasone (FLONASE) 50 MCG/ACT nasal spray  Place 2 sprays into the nose 2 (two) times daily. 2 sprays in each nostril    . levothyroxine (SYNTHROID, LEVOTHROID) 75 MCG tablet Take 75 mcg by mouth daily before breakfast.     . polyethylene glycol powder (MIRALAX) powder Take 17 g by mouth as needed for mild constipation.     . potassium chloride SA (K-DUR,KLOR-CON) 20 MEQ tablet Take 40 mEq by mouth daily.    . Rivaroxaban (XARELTO) 15 MG TABS tablet Take 1 tablet (15 mg total) by mouth daily with supper. 30 tablet   . sertraline (ZOLOFT) 25 MG tablet Take 25 mg by mouth daily.    Marland Kitchen torsemide (DEMADEX) 20 MG tablet Take 40 mg by mouth daily.     No current facility-administered medications for this visit.     Allergies:   Codeine, Iron, Morphine, and Toviaz  [fesoterodine fumarate]   Social History:  The patient  reports that she quit smoking about 36 years ago. Her smoking use included cigarettes. She smoked 0.50 packs per day. She has never used smokeless tobacco. She reports current alcohol use. She reports that she does not use drugs.   Family History:  The patient's family history includes Heart disease in her brother and father; Parkinson's disease in her brother and sister.  ROS:  Please see the history of present illness.  All other systems are reviewed and otherwise negative.     BP 134/80   Pulse 87   Ht 5\' 4"  (1.626 m)   Wt 205 lb (93 kg)   SpO2 95%   BMI 35.19 kg/m   Well developed and well nourished in no acute distress HENT normal Neck supple with JVP-flat Clear Device pocket well healed; without hematoma or erythema.  There is no tethering  Regular rate and rhythm, no gallop No murmur Abd-soft with active BS No Clubbing cyanosis  edema Skin-warm and dry A & Oriented  Grossly normal sensory and motor function  ECG Apacing @ 87 14/14/42 PVCs freq   Recent Labs: 12//6/16: BUN 27, Creat 1.77, K+ 3.4 , H/H 11/34, plts 188, TSH 2.261, AST 10, ALT 7, LDL 120, HDL 54, Trigs 96 09/13/2018: Hemoglobin 10.9; Platelets 169  No results found for requested labs within last 8760 hours.   CrCl cannot be calculated (Patient's most recent lab result is older than the maximum 21 days allowed.).   Wt Readings from Last 3 Encounters:  05/20/19 205 lb (93 kg)  04/29/19 200 lb (90.7 kg)  04/10/19 205 lb (93 kg)     Other studies reviewed: Additional studies/records reviewed today include: summarized above  DEVICE information: STJ PPM, implanted 10/21/10, Dr. [name]  ASSESSMENT AND PLAN:  Pacemaker-St. Jude The patient's device was interrogated.  The information was reviewed. No changes were made in the programming.    Atrial fibrillation-paroxysmal  HFpEF  Treated hypothyroidism  PVCs-frequent (4.7%)   Tremor   Renal insufficiency grade 3    PVCs remain present and increasing burden.  They are asymptomatic.  We will not address them.  Atrial fibrillation burden is stable.  No bleeding on anticoagulation.  We will check TSH on her thyroid therapy, hemoglobin which was a little bit low last fall and metabolic profile with her class III renal insufficiency    Virl Axe  05/20/2019 12:35 PM     [address] [place] [address] [phone] (office)  [phone] (fax)

## 2019-05-21 LAB — BASIC METABOLIC PANEL
BUN/Creatinine Ratio: 13 (ref 12–28)
BUN: 23 mg/dL (ref 8–27)
CO2: 29 mmol/L (ref 20–29)
Calcium: 9.4 mg/dL (ref 8.7–10.3)
Chloride: 102 mmol/L (ref 96–106)
Creatinine, Ser: 1.76 mg/dL — ABNORMAL HIGH (ref 0.57–1.00)
GFR calc Af Amer: 30 mL/min/{1.73_m2} — ABNORMAL LOW (ref >59)
GFR calc non Af Amer: 26 mL/min/{1.73_m2} — ABNORMAL LOW (ref >59)
Glucose: 90 mg/dL (ref 65–99)
Potassium: 4.3 mmol/L (ref 3.5–5.2)
Sodium: 146 mmol/L — ABNORMAL HIGH (ref 134–144)

## 2019-05-21 LAB — CBC
Hematocrit: 39.1 % (ref 34.0–46.6)
Hemoglobin: 13 g/dL (ref 11.1–15.9)
MCH: 31.5 pg (ref 26.6–33.0)
MCHC: 33.2 g/dL (ref 31.5–35.7)
MCV: 95 fL (ref 79–97)
Platelets: 217 10*3/uL (ref 150–450)
RBC: 4.13 x10E6/uL (ref 3.77–5.28)
RDW: 12.9 % (ref 11.7–15.4)
WBC: 9.2 10*3/uL (ref 3.4–10.8)

## 2019-05-21 LAB — TSH: TSH: 2.63 u[IU]/mL (ref 0.450–4.500)

## 2019-05-27 ENCOUNTER — Encounter: Payer: Self-pay | Admitting: Cardiology

## 2019-05-27 NOTE — Progress Notes (Signed)
Remote pacemaker transmission.   

## 2019-07-03 DIAGNOSIS — Z9189 Other specified personal risk factors, not elsewhere classified: Secondary | ICD-10-CM | POA: Diagnosis not present

## 2019-07-11 DIAGNOSIS — Z20828 Contact with and (suspected) exposure to other viral communicable diseases: Secondary | ICD-10-CM | POA: Diagnosis not present

## 2019-07-12 LAB — NOVEL CORONAVIRUS, NAA: SARS-CoV-2, NAA: NOT DETECTED

## 2019-07-17 DIAGNOSIS — Z9189 Other specified personal risk factors, not elsewhere classified: Secondary | ICD-10-CM | POA: Diagnosis not present

## 2019-07-23 DIAGNOSIS — Z9189 Other specified personal risk factors, not elsewhere classified: Secondary | ICD-10-CM | POA: Diagnosis not present

## 2019-07-30 DIAGNOSIS — Z20828 Contact with and (suspected) exposure to other viral communicable diseases: Secondary | ICD-10-CM | POA: Diagnosis not present

## 2019-07-30 DIAGNOSIS — Z9189 Other specified personal risk factors, not elsewhere classified: Secondary | ICD-10-CM | POA: Diagnosis not present

## 2019-08-06 DIAGNOSIS — Z9189 Other specified personal risk factors, not elsewhere classified: Secondary | ICD-10-CM | POA: Diagnosis not present

## 2019-08-06 DIAGNOSIS — Z20828 Contact with and (suspected) exposure to other viral communicable diseases: Secondary | ICD-10-CM | POA: Diagnosis not present

## 2019-08-08 ENCOUNTER — Encounter: Payer: Self-pay | Admitting: Internal Medicine

## 2019-08-14 DIAGNOSIS — Z9189 Other specified personal risk factors, not elsewhere classified: Secondary | ICD-10-CM | POA: Diagnosis not present

## 2019-08-17 LAB — CUP PACEART REMOTE DEVICE CHECK
Battery Remaining Longevity: 41 mo
Battery Remaining Percentage: 35 %
Battery Voltage: 2.83 V
Brady Statistic AP VP Percent: 2.1 %
Brady Statistic AP VS Percent: 51 %
Brady Statistic AS VP Percent: 1 %
Brady Statistic AS VS Percent: 38 %
Brady Statistic RA Percent Paced: 26 %
Brady Statistic RV Percent Paced: 1.8 %
Date Time Interrogation Session: 20201112083255
Implantable Lead Implant Date: 20120119
Implantable Lead Implant Date: 20120119
Implantable Lead Location: 753859
Implantable Lead Location: 753860
Implantable Lead Model: 1948
Implantable Pulse Generator Implant Date: 20120119
Lead Channel Impedance Value: 410 Ohm
Lead Channel Impedance Value: 730 Ohm
Lead Channel Pacing Threshold Amplitude: 0.5 V
Lead Channel Pacing Threshold Amplitude: 0.75 V
Lead Channel Pacing Threshold Pulse Width: 0.5 ms
Lead Channel Pacing Threshold Pulse Width: 0.5 ms
Lead Channel Sensing Intrinsic Amplitude: 1.1 mV
Lead Channel Sensing Intrinsic Amplitude: 9.9 mV
Lead Channel Setting Pacing Amplitude: 1.5 V
Lead Channel Setting Pacing Amplitude: 2.5 V
Lead Channel Setting Pacing Pulse Width: 0.5 ms
Lead Channel Setting Sensing Sensitivity: 2 mV
Pulse Gen Model: 2210
Pulse Gen Serial Number: 7198677

## 2019-08-19 ENCOUNTER — Encounter: Payer: Medicare Other | Admitting: *Deleted

## 2019-08-20 ENCOUNTER — Telehealth: Payer: Self-pay | Admitting: General Surgery

## 2019-08-20 DIAGNOSIS — Z9189 Other specified personal risk factors, not elsewhere classified: Secondary | ICD-10-CM | POA: Diagnosis not present

## 2019-08-20 NOTE — Telephone Encounter (Signed)
Fax received from [facility] [phone]) stating the patient needed an appointment for recertification of her O2 usage before 09/14/19.  Called the patient and she is currently a resident at [facility]. Has been scheduled for an appointment on 09/14/19 at 10:00. Once seen the notes have to be faxed to [phone] with the sheet faxed that has to be signed by Dr. Halford Chessman.

## 2019-09-02 ENCOUNTER — Telehealth: Payer: Self-pay | Admitting: *Deleted

## 2019-09-02 NOTE — Telephone Encounter (Signed)
[facility] alert received for increased AT/AF burden, now 29%, longest episode 25.5hrs beginning on 08/27/19. V rates elevated at times. Presenting rhythm from 08/29/19 shows AP/VS w/PVCs and PACs in the 60s-80s. [facility] AT/AF burden/duration alerts off for now.  Spoke with patient. She reports feeling some "flutters," also feels times of weakness and increased SOB. More noticeable in the past couple of months. Pt resides at [facility], has had limited opportunities to ambulate outside her room due to COVID restrictions. Reports facility provides medications, so she believes she is compliant with Xarelto. Amiodarone previously d/c due to tremor. Per previous provider notes, may need augmented rate control or AV node ablation. Routed to Dr. [name] for recommendations. Pt aware I will call back with any updates. No further questions at this time.

## 2019-09-03 NOTE — Telephone Encounter (Signed)
Spoke with patient. Per Dr. Olin Pia recommendation, she is agreeable to an appointment with Dr. [name] on 09/05/19 at 2:30pm. Aware of office address and phone number. She plans to arrange her own transportation through [place]. No further questions at this time.

## 2019-09-05 ENCOUNTER — Other Ambulatory Visit: Payer: Self-pay

## 2019-09-05 ENCOUNTER — Ambulatory Visit (INDEPENDENT_AMBULATORY_CARE_PROVIDER_SITE_OTHER): Payer: Medicare Other | Admitting: Internal Medicine

## 2019-09-05 ENCOUNTER — Encounter: Payer: Self-pay | Admitting: Pulmonary Disease

## 2019-09-05 ENCOUNTER — Ambulatory Visit (INDEPENDENT_AMBULATORY_CARE_PROVIDER_SITE_OTHER): Payer: Medicare Other | Admitting: Pulmonary Disease

## 2019-09-05 ENCOUNTER — Encounter: Payer: Self-pay | Admitting: Internal Medicine

## 2019-09-05 VITALS — BP 128/70 | HR 118 | Ht 64.0 in | Wt 203.8 lb

## 2019-09-05 VITALS — BP 130/68 | HR 64 | Temp 98.2°F | Ht 64.0 in | Wt 202.6 lb

## 2019-09-05 DIAGNOSIS — G4733 Obstructive sleep apnea (adult) (pediatric): Secondary | ICD-10-CM

## 2019-09-05 DIAGNOSIS — J398 Other specified diseases of upper respiratory tract: Secondary | ICD-10-CM

## 2019-09-05 DIAGNOSIS — Z950 Presence of cardiac pacemaker: Secondary | ICD-10-CM

## 2019-09-05 DIAGNOSIS — I5032 Chronic diastolic (congestive) heart failure: Secondary | ICD-10-CM | POA: Diagnosis not present

## 2019-09-05 DIAGNOSIS — J9611 Chronic respiratory failure with hypoxia: Secondary | ICD-10-CM | POA: Diagnosis not present

## 2019-09-05 DIAGNOSIS — I480 Paroxysmal atrial fibrillation: Secondary | ICD-10-CM

## 2019-09-05 DIAGNOSIS — I495 Sick sinus syndrome: Secondary | ICD-10-CM | POA: Diagnosis not present

## 2019-09-05 MED ORDER — METOPROLOL TARTRATE 25 MG PO TABS: 25.0000 mg | ORAL_TABLET | Freq: Two times a day (BID) | ORAL | 0 refills | Status: DC

## 2019-09-05 MED ORDER — AMIODARONE HCL 200 MG PO TABS: ORAL_TABLET | ORAL | 3 refills | Status: DC

## 2019-09-05 NOTE — Patient Instructions (Signed)
Follow up in 1 year.

## 2019-09-05 NOTE — Progress Notes (Signed)
Maybe as long as calling [place]    Cardiology Office Note Date:  09/05/2019  Patient ID:  Cindy, Cervantes January 05, 1932, MRN [id number] PCP:  Gayland Curry, DO  Electrophysiology: Dr. [name] [facility] Chesley Mires, MD as Consulting Physician (Pulmonary Disease) Volanda Napoleon, MD as Consulting Physician (Oncology) Trixie Rude., MD as Consulting Physician (Ophthalmology)   Chief Complaint:  Routine EP, pacer visit  History of Present Illness: Cindy Cervantes is a 85 y.o. female  Seen in followup for her paroxysmal atrial fibrillation with a rapid ventricular response and sinus node dysfunction for which she required pacemaker implantation in the fall of 2011.   T    Date Cr K TSH LFTs Hgb  6/17    3.25 6    12/18 1.6 3.8 3.24 4 10.9  6/19 1.8 4.4 3.65    8/20 1.76  2.63              On Anticoagulation;  No bleeding issues    She has significant tremors that has prompted the need to stop sewing.  Neuro thought not Parkinson Tremor--amiodarone stopped there has been a gradually increasing PVC burden, less than 1% 10/19, 2.5% 5/20.   She is now coming back because of increasing symptoms of fatigue and exercise intolerance.  Divide interrogation-remote demonstrates a significant increase in Afib burden and also ventricular response.    She notes that her tremor did not improve significantly following the discontinuation of her amiodarone; she is interested in resuming it.  No peripheral edema.  No chest pain.      Past Medical History:  Diagnosis Date  . Allergic rhinitis   . Anemia of renal disease 10/21/2011  . Anemia, iron deficiency 10/21/2011  . Cholelithiasis   . Chronic diastolic heart failure ([place])   . Colon polyp   . Constipation   . Cough    2nd to reflux  . Debility, unspecified   . Dehydration   . Depression   . Diaphragm dysfunction    Right hemidiaphragm  . Disorder of bone and cartilage, unspecified   . Diverticulosis of  colon   . Dysphagia   . GERD (gastroesophageal reflux disease) 02/08/2013  . History of hyperkalemia in setting of spironolactone 09/2010  . Hyperlipidemia   . Hypertension   . Hypopotassemia   . Hypothyroidism 02/08/2013  . Long term (current) use of anticoagulants   . MDS (myelodysplastic syndrome), low grade ([place]) 10/21/2011  . Obesity   . Obesity hypoventilation syndrome ([place])   . OSA (obstructive sleep apnea)   . Osteoarthrosis, unspecified whether generalized or localized, unspecified site   . Other specified disease of white blood cells   . Pacemaker    Implanted December 2012  . Paroxysmal A-fib (HCC) with RVR   Amiodarone  . Reflux esophagitis   . Rosacea 05/15/2013  . Senile cataract   . Sinus node dysfunction (HCC)   . Tracheobronchomalacia   . Unspecified vitamin D deficiency   . Urinary incontinence   . Xerostomia     Past Surgical History:  Procedure Laterality Date  . CATARACT EXTRACTION  2008  . PACEMAKER PLACEMENT  10/21/2010   STJ, dural chamber Dr. [name]  . TOTAL KNEE ARTHROPLASTY  1999   right knee  . TOTAL KNEE ARTHROPLASTY  2003   left knee  . TUBAL LIGATION  1964  . Tummy tuck  2000   pt "almost died"; respiratory distress, became delrious    Current  Outpatient Medications  Medication Sig Dispense Refill  . acetaminophen (TYLENOL) 325 MG tablet Take 650 mg by mouth every 4 (four) hours as needed.     Marland Kitchen allopurinol (ZYLOPRIM) 100 MG tablet Take 100 mg by mouth daily.    Marland Kitchen ezetimibe (ZETIA) 10 MG tablet Take 10 mg by mouth daily.    . fluticasone (FLONASE) 50 MCG/ACT nasal spray Place 2 sprays into the nose 2 (two) times daily. 2 sprays in each nostril    . levothyroxine (SYNTHROID, LEVOTHROID) 75 MCG tablet Take 75 mcg by mouth daily before breakfast.     . polyethylene glycol powder (MIRALAX) powder Take 17 g by mouth as needed for mild constipation.     . potassium chloride SA (K-DUR,KLOR-CON) 20 MEQ tablet Take 40 mEq by mouth daily.    .  Rivaroxaban (XARELTO) 15 MG TABS tablet Take 1 tablet (15 mg total) by mouth daily with supper. 30 tablet   . sertraline (ZOLOFT) 25 MG tablet Take 25 mg by mouth daily.    Marland Kitchen torsemide (DEMADEX) 20 MG tablet Take 40 mg by mouth daily.     No current facility-administered medications for this visit.     Allergies:   Codeine, Iron, Morphine, and Toviaz [fesoterodine fumarate]   Social History:  The patient  reports that she quit smoking about 36 years ago. Her smoking use included cigarettes. She smoked 0.50 packs per day. She has never used smokeless tobacco. She reports current alcohol use. She reports that she does not use drugs.   Family History:  The patient's family history includes Heart disease in her brother and father; Parkinson's disease in her brother and sister.  ROS:  Please see the history of present illness.  All other systems are reviewed and otherwise negative.     BP 128/70   Pulse (!) 118   Ht 5\' 4"  (1.626 m)   Wt 203 lb 12.8 oz (92.4 kg)   SpO2 97%   BMI 34.98 kg/m  Well developed and nourished in no acute distress HENT normal Neck supple with JVP-flat Carotids brisk and full without bruits Clear Irregularly irregular rate and rhythm with rapid ventricular response, no murmurs or gallops Abd-soft with active BS without hepatomegaly No Clubbing cyanosis edema Skin-warm and dry A & Oriented  Grossly normal sensory and motor function   ECG atrial fibrillation at 118. Intervals-/10/32 PVCs-frequent left bundle inferior axis with transition at V2       ASSESSMENT AND PLAN:  Pacemaker-[employer] The patient's device was interrogated.  The information was reviewed. No changes were made in the programming.    Atrial fibrillation-persistent  HFpEF acute/chronic  Treated hypothyroidism  PVCs-frequent (4.7%)   Tremor  Renal insufficiency grade 3   Continues with PVCs.  However, the bigger problem seems to be atrial fibrillation with a rapid rate  worsening in the context of discontinuing her amiodarone because of presumed neuro toxicities manifesting as tremors.  However, at this point she would like to resume amiodarone as the tremors did not improve off drug.  In the event that she does have recurring symptoms, we discussed AV junction ablation.  Given the very rapid rates that she is currently having, we will also in addition to the amiodarone use metoprolol tartrate 25 mg twice daily for 3 weeks and then stop it.  She will need surveillance laboratories again in 6 weeks and then I will see her in 12 weeks.  We spent more than 50% of our >25 min visit  in face to face counseling regarding the above    Virl Axe  09/05/2019 3:13 PM     [address] [place] [address] [place] [address] [phone] (office)  [phone] (fax)

## 2019-09-05 NOTE — Patient Instructions (Signed)
Medication Instructions:  Take Amiodorone(2 tablets) 400mg  twice daily for 2 weeks  Then, begin Amiodorone(1tablet) 200mg  twice daily for 2 weeks Then, begin Amiodorone (1tablet) 200mg  once daily until instructed otherwise  Metoprolol Tartrate 25mg  tablet twice daily for 3 weeks  Labwork: Please have your facility draw a CMET and TSH in 6 weeks  Testing/Procedures: None ordered.   Follow-Up: You have a virtual visit scheduled with Dr [name] 12/12/2019 at 2pm  Any Other Special Instructions Will Be Listed Below (If Applicable).     If you need a refill on your cardiac medications before your next appointment, please call your pharmacy.

## 2019-09-05 NOTE — Progress Notes (Signed)
[facility]  Chief Complaint  Patient presents with  . OSA on BIPAP    Constitutional:  BP 130/68 (BP Location: Right Arm, Patient Position: Sitting, Cuff Size: Normal)   Pulse 64   Temp 98.2 F (36.8 C)   Ht 5\' 4"  (1.626 m)   Wt 202 lb 9.6 oz (91.9 kg)   SpO2 96% Comment: on room air  BMI 34.78 kg/m   Past Medical History:  Xerostomia, Vit D deficiency, Urine incontinence, Sinus nod dysfx s/p PM, Rosacea, PAF, OA, MDS, Hypothyroidism, HTN, HLD, GERD, Diverticulosis, Depression, Colon polyp, Diastolic CHF  Brief Summary:  Cindy Cervantes is a 85 y.o. female former smoker with chronic hypoxic respiratory failure 2nd to OSA, tracheobronchomalacia, and Rt hemidiaphragm elevation.  Lives in [place].  Uses Bipap nightly.  No issues with mask fit.  Denies sinus congestion, sore throat, dry mouth, or aerophagia.  Using 2 liters oxygen with Bipap.    Has noticed palpitations more recently.  Has appointment with cardiology this afternoon.  Physical Exam:   Appearance - well kempt   ENMT - clear nasal mucosa, midline nasal  septum, no oral exudates, no LAN, trachea midline  Respiratory - normal chest wall, normal respiratory effort, no accessory muscle use, no wheeze/rales  CV - irregular, no murmurs, no peripheral edema, radial pulses symmetric  GI - soft, non tender, no masses  Lymph - no adenopathy noted in neck and axillary areas  MSK - normal gait  Ext - no cyanosis, clubbing, or joint inflammation noted  Skin - no rashes, lesions, or ulcers  Neuro - normal strength, oriented x 3  Psych - normal mood and affect   Assessment/Plan:   Chronic hypoxic respiratory failure secondary to obstructive sleep apnea, tracheobronchomalacia, and Rt hemidiaphragm paralysis. - she has benefit from use of Bipap and 2 liters oxygen - it is medically necessary to continue 2 liters oxygen with Bipap in setting of chronic hypoxic  respiratory failure - continue Bipap 12/8 cm H2O with 2 liters oxygen when asleep  Atrial fibrillation. - she has appointment with cardiology later today   Patient Instructions  Follow up in 1 year    Chesley Mires, MD [place] Pager: [phone] 09/05/2019, 10:33 AM  Flow Sheet     Pulmonary tests:  PFT's March 29 2010 >> FEV1 1.34 (72) with ratio 75 and ERV 42, DLC0 55 > corrects to 137  Chest imaging:  CT chest 10/17/10 >> GGO LUL, no PE, tracheobronchomalacia involving the visualized trachea with areas of air trapping seen throughout both lungs  Sleep tests:  PSG 12/14/10 >> AHI 17.9, REM 40.8 BPAP titraton 01/12/11 >> 18/13 cm H2O with 3 liters oxygen. ONO on BiPAP 03/16/15 >>Test time 8 hrs 2 min. Mean SpO2 90.3%, low SpO2 70%. Spent 33 min with SpO2 <88%. ONO with Bipap 02/13/17 >>test time 7 hrs 5 min. Average SpO2 90%, low SpO2 80%. Spent 1 hr 41 min with SpO2 <88% Bipap 08/05/19 to 09/03/19 >> used on 30 of 30 nights with average 7 hrs 42 min.  Average AHI 0.4 with Bipap 12/8 cm H2O.  Cardiac tests:  Echo 12/06/17 >> EF 65 to 70%, grade 1 DD  Medications:   Allergies as of 09/05/2019      Reactions   Codeine    Extreme lethargy   Iron    By infusion   Morphine    Toviaz [fesoterodine Fumarate]       Medication List  Accurate as of September 05, 2019 10:33 AM. If you have any questions, ask your nurse or doctor.        allopurinol 100 MG tablet Commonly known as: ZYLOPRIM Take 100 mg by mouth daily.   ezetimibe 10 MG tablet Commonly known as: ZETIA Take 10 mg by mouth daily.   fluticasone 50 MCG/ACT nasal spray Commonly known as: FLONASE Place 2 sprays into the nose 2 (two) times daily. 2 sprays in each nostril   levothyroxine 75 MCG tablet Commonly known as: SYNTHROID Take 75 mcg by mouth daily before breakfast.   MiraLax 17 GM/SCOOP powder Generic drug: polyethylene glycol powder Take 17 g by mouth as  needed for mild constipation.   potassium chloride SA 20 MEQ tablet Commonly known as: KLOR-CON Take 40 mEq by mouth daily.   Rivaroxaban 15 MG Tabs tablet Commonly known as: Xarelto Take 1 tablet (15 mg total) by mouth daily with supper.   sertraline 25 MG tablet Commonly known as: ZOLOFT Take 25 mg by mouth daily.   torsemide 20 MG tablet Commonly known as: DEMADEX Take 40 mg by mouth daily.   Tylenol 325 MG tablet Generic drug: acetaminophen Take 650 mg by mouth every 4 (four) hours as needed.       Past Surgical History:  She  has a past surgical history that includes Tubal ligation (1964); Total knee arthroplasty (1999); Tummy tuck (2000); Total knee arthroplasty (2003); Cataract extraction (2008); and pacemaker placement (10/21/2010).  Family History:  Her family history includes Heart disease in her brother and father; Parkinson's disease in her brother and sister.  Social History:  She  reports that she quit smoking about 36 years ago. Her smoking use included cigarettes. She smoked 0.50 packs per day. She has never used smokeless tobacco. She reports current alcohol use. She reports that she does not use drugs.

## 2019-09-05 NOTE — Addendum Note (Signed)
Addended by: Chesley Mires on: 09/05/2019 10:53 AM   Modules accepted: Orders

## 2019-10-07 DIAGNOSIS — Z9189 Other specified personal risk factors, not elsewhere classified: Secondary | ICD-10-CM | POA: Diagnosis not present

## 2019-10-07 DIAGNOSIS — Z20828 Contact with and (suspected) exposure to other viral communicable diseases: Secondary | ICD-10-CM | POA: Diagnosis not present

## 2019-10-15 DIAGNOSIS — Z23 Encounter for immunization: Secondary | ICD-10-CM | POA: Diagnosis not present

## 2019-10-15 LAB — BASIC METABOLIC PANEL
BUN: 24 — AB (ref 4–21)
CO2: 29 — AB (ref 13–22)
Chloride: 101 (ref 99–108)
Creatinine: 2 — AB (ref <=1.1)
Glucose: 95
Potassium: 4.4 (ref 3.4–5.3)
Sodium: 143 (ref 137–147)

## 2019-10-15 LAB — COMPREHENSIVE METABOLIC PANEL
Albumin: 3.9 (ref 3.5–5.0)
Calcium: 9.1 (ref 8.7–10.7)
Globulin: 2.3

## 2019-10-15 LAB — TSH: TSH: 3.58 (ref 0.41–5.90)

## 2019-10-16 ENCOUNTER — Encounter: Payer: Self-pay | Admitting: Internal Medicine

## 2019-10-16 ENCOUNTER — Other Ambulatory Visit: Payer: Self-pay

## 2019-10-16 ENCOUNTER — Non-Acute Institutional Stay: Payer: Medicare Other | Admitting: Internal Medicine

## 2019-10-16 VITALS — BP 128/68 | HR 57 | Temp 98.9°F | Ht 64.0 in | Wt 203.0 lb

## 2019-10-16 DIAGNOSIS — L89310 Pressure ulcer of right buttock, unstageable: Secondary | ICD-10-CM

## 2019-10-16 DIAGNOSIS — I5032 Chronic diastolic (congestive) heart failure: Secondary | ICD-10-CM | POA: Diagnosis not present

## 2019-10-16 DIAGNOSIS — G4733 Obstructive sleep apnea (adult) (pediatric): Secondary | ICD-10-CM

## 2019-10-16 DIAGNOSIS — G250 Essential tremor: Secondary | ICD-10-CM

## 2019-10-16 DIAGNOSIS — N179 Acute kidney failure, unspecified: Secondary | ICD-10-CM | POA: Diagnosis not present

## 2019-10-16 DIAGNOSIS — I480 Paroxysmal atrial fibrillation: Secondary | ICD-10-CM | POA: Diagnosis not present

## 2019-10-16 NOTE — Progress Notes (Signed)
Location:  [facility] clinic Provider:  Kajah Santizo L. [name], D.O., C.M.D.  Code Status: DNR Goals of Care:  Advanced Directives 10/16/2019  Does Patient Have a Medical Advance Directive? Yes  Type of Advance Directive Out of facility DNR (pink MOST or yellow form);Healthcare Power of Attorney  Does patient want to make changes to medical advance directive? No - Patient declined  Copy of [place] in Chart? -  Pre-existing out of facility DNR order (yellow form or pink MOST form) -     Chief Complaint  Patient presents with  . Medical Management of Chronic Issues    , 80month follow up, tired all the time and not sleeping well.    HPI: Patient is a 86 y.o. female seen today for medical management of chronic diseases.    She had no energy, was feeling flutters in her chest.  She went to Dr. [name] b/c his pacemaker had shown some problems.  Meds were changed.  She is now tired b/c she was given two Christmas presents that keep her mind from shutting down.  One is a digital frame that makes her dream about her deceased relatives.  Company sends an Leisure centre manager with a question and she writes a response and it ends up being a story about her for her son in the end.  She thinks about her father who was a "doer" and he was into a lot of things.    She saw Dr. Carles Collet and she told her she does not have Parkinson's.  She still has her tremor.  It's worse at times.  At night her legs, she feels her pulse.  They burn some.  They do end up moving.    If she's tired, she feels sick in her stomach for a couple hrs in the morning and then better by lunch.    She feels better talking to people.    She uses her BiPAP and no problems with it she's aware of.  She is wondering b/c she's dreaming and was told she should not if it's working right.    Once in a great while, she'll smell smoke when no smokers are around.    Her sore area on her buttocks resolved.  She has a dry patch on her left forehead  that flakes off and itches.  Some lumps on the side of her hair and her back.    She got her shot on Monday for covid (#1).  Past Medical History:  Diagnosis Date  . Allergic rhinitis   . Anemia of renal disease 10/21/2011  . Anemia, iron deficiency 10/21/2011  . Cholelithiasis   . Chronic diastolic heart failure ([place])   . Colon polyp   . Constipation   . Cough    2nd to reflux  . Debility, unspecified   . Dehydration   . Depression   . Diaphragm dysfunction    Right hemidiaphragm  . Disorder of bone and cartilage, unspecified   . Diverticulosis of colon   . Dysphagia   . GERD (gastroesophageal reflux disease) 02/08/2013  . History of hyperkalemia in setting of spironolactone 09/2010  . Hyperlipidemia   . Hypertension   . Hypopotassemia   . Hypothyroidism 02/08/2013  . Long term (current) use of anticoagulants   . MDS (myelodysplastic syndrome), low grade ([place]) 10/21/2011  . Obesity   . Obesity hypoventilation syndrome ([place])   . OSA (obstructive sleep apnea)   . Osteoarthrosis, unspecified whether generalized or localized, unspecified site   .  Other specified disease of white blood cells   . Pacemaker    Implanted December 2012  . Paroxysmal A-fib (HCC) with RVR   Amiodarone  . Reflux esophagitis   . Rosacea 05/15/2013  . Senile cataract   . Sinus node dysfunction (HCC)   . Tracheobronchomalacia   . Unspecified vitamin D deficiency   . Urinary incontinence   . Xerostomia     Past Surgical History:  Procedure Laterality Date  . CATARACT EXTRACTION  2008  . PACEMAKER PLACEMENT  10/21/2010   STJ, dural chamber Dr. [name]  . TOTAL KNEE ARTHROPLASTY  1999   right knee  . TOTAL KNEE ARTHROPLASTY  2003   left knee  . TUBAL LIGATION  1964  . Tummy tuck  2000   pt "almost died"; respiratory distress, became delrious    Allergies  Allergen Reactions  . Codeine     Extreme lethargy  . Iron     By infusion  . Morphine   . Toviaz [Fesoterodine Fumarate]      Outpatient Encounter Medications as of 10/16/2019  Medication Sig  . acetaminophen (TYLENOL) 325 MG tablet Take 650 mg by mouth every 4 (four) hours as needed.   Marland Kitchen allopurinol (ZYLOPRIM) 100 MG tablet Take 100 mg by mouth daily.  Marland Kitchen amiodarone (PACERONE) 200 MG tablet Take 400mg  twice daily x 2wks; then take 200mg  daily x2wks;then take 200mg  daily  . ezetimibe (ZETIA) 10 MG tablet Take 10 mg by mouth daily.  . fluticasone (FLONASE) 50 MCG/ACT nasal spray Place 2 sprays into the nose 2 (two) times daily. 2 sprays in each nostril  . levothyroxine (SYNTHROID, LEVOTHROID) 75 MCG tablet Take 75 mcg by mouth daily before breakfast.   . polyethylene glycol powder (MIRALAX) powder Take 17 g by mouth as needed for mild constipation.   . potassium chloride SA (K-DUR,KLOR-CON) 20 MEQ tablet Take 40 mEq by mouth daily.  . Rivaroxaban (XARELTO) 15 MG TABS tablet Take 1 tablet (15 mg total) by mouth daily with supper.  . sertraline (ZOLOFT) 25 MG tablet Take 25 mg by mouth daily.  Marland Kitchen torsemide (DEMADEX) 20 MG tablet Take 40 mg by mouth daily.  . metoprolol tartrate (LOPRESSOR) 25 MG tablet Take 1 tablet (25 mg total) by mouth 2 (two) times daily for 21 days.   No facility-administered encounter medications on file as of 10/16/2019.    Review of Systems:  Review of Systems  Constitutional: Positive for malaise/fatigue. Negative for chills and fever.  HENT: Negative for congestion and sore throat.   Eyes: Negative for blurred vision.  Respiratory: Positive for shortness of breath. Negative for cough.   Cardiovascular: Negative for chest pain, palpitations and leg swelling.  Gastrointestinal: Negative for abdominal pain, blood in stool, constipation, diarrhea and melena.  Genitourinary: Negative for dysuria.  Musculoskeletal: Negative for falls, joint pain and myalgias.  Skin: Negative for itching and rash.  Neurological: Positive for tingling, tremors and sensory change. Negative for dizziness and  loss of consciousness.  Endo/Heme/Allergies: Bruises/bleeds easily.  Psychiatric/Behavioral: Negative for depression and memory loss. The patient is not nervous/anxious and does not have insomnia.     Health Maintenance  Topic Date Due  . TETANUS/TDAP  07/07/2025  . INFLUENZA VACCINE  Completed  . DEXA SCAN  Completed  . PNA vac Low Risk Adult  Completed    Physical Exam: Vitals:   10/16/19 1056  BP: 128/68  Pulse: (!) 57  Temp: 98.9 F (37.2 C)  TempSrc: Temporal  SpO2: 97%  Weight: 203 lb (92.1 kg)  Height: 5\' 4"  (1.626 m)   Body mass index is 34.84 kg/m. Physical Exam Vitals reviewed.  Constitutional:      Appearance: Normal appearance.  Cardiovascular:     Rate and Rhythm: Rhythm irregular.     Heart sounds: No murmur.  Pulmonary:     Effort: Pulmonary effort is normal.     Breath sounds: Normal breath sounds. No rales.  Abdominal:     General: Bowel sounds are normal.  Skin:    General: Skin is warm and dry.     Comments: Scaly plaque on left forehead and others on scalp and back  Neurological:     General: No focal deficit present.     Mental Status: She is alert and oriented to person, place, and time.     Comments: Rapid tremor with activity  Psychiatric:        Mood and Affect: Mood normal.        Behavior: Behavior normal.     Labs reviewed: Basic Metabolic Panel: Recent Labs    05/20/19 1255  NA 146*  K 4.3  CL 102  CO2 29  GLUCOSE 90  BUN 23  CREATININE 1.76*  CALCIUM 9.4  TSH 2.630   Liver Function Tests: No results for input(s): AST, ALT, ALKPHOS, BILITOT, PROT, ALBUMIN in the last 8760 hours. No results for input(s): LIPASE, AMYLASE in the last 8760 hours. No results for input(s): AMMONIA in the last 8760 hours. CBC: Recent Labs    05/20/19 1255  WBC 9.2  HGB 13.0  HCT 39.1  MCV 95  PLT 217   Lipid Panel: No results for input(s): CHOL, HDL, LDLCALC, TRIG, CHOLHDL, LDLDIRECT in the last 8760 hours. Lab Results   Component Value Date   HGBA1C 5.5 09/06/2016    Procedures since last visit: No results found.  Assessment/Plan 1. Acute kidney injury ([place]) -cr bumped from 1.76 to 2.0 -decrease torsemide to 20mg  daily -f/u bmp in a week -staff to notify Dr. Olin Pia office also  2. Paroxysmal atrial fibrillation (HCC) -continues on amiodarone, lopressor  3. Chronic diastolic heart failure ([facility]) -decrease torsemide, pt to keep an eye out for increased dyspnea, swelling and let nurse know to contact me  4. Pressure injury of right buttock, unstageable ([place]) -resolved  5. Benign essential tremor -a little worse, also having some restless leg symptoms now which are a bit concerning that she may actually have a parkinsonian syndrome  6. OSA treated with BiPAP -cont BiPAP, she's going to report to pulmonary that she is now having dreams which she was told she shouldn't if her bipap is working right  Labs/tests ordered:  No new Next appt:  6 mos med mgt   Dvontae Ruan L. Celestine Prim, D.O. [facility] [address], [place] [address] Cell Phone (Mon-Fri 8am-5pm):  [phone] On Call:  [phone] & follow prompts after 5pm & weekends Office Phone:  [phone] Office Fax:  [phone]

## 2019-10-21 DIAGNOSIS — Z9189 Other specified personal risk factors, not elsewhere classified: Secondary | ICD-10-CM | POA: Diagnosis not present

## 2019-10-21 DIAGNOSIS — Z20828 Contact with and (suspected) exposure to other viral communicable diseases: Secondary | ICD-10-CM | POA: Diagnosis not present

## 2019-10-23 DIAGNOSIS — E785 Hyperlipidemia, unspecified: Secondary | ICD-10-CM | POA: Diagnosis not present

## 2019-10-23 DIAGNOSIS — D649 Anemia, unspecified: Secondary | ICD-10-CM | POA: Diagnosis not present

## 2019-10-23 LAB — BASIC METABOLIC PANEL
BUN: 24 — AB (ref 4–21)
CO2: 29 — AB (ref 13–22)
Chloride: 101 (ref 99–108)
Creatinine: 1.7 — AB (ref 0.5–1.1)
Glucose: 107
Potassium: 4.7 (ref 3.4–5.3)
Sodium: 138 (ref 137–147)

## 2019-10-23 LAB — COMPREHENSIVE METABOLIC PANEL: Calcium: 8.9 (ref 8.7–10.7)

## 2019-10-25 ENCOUNTER — Telehealth: Payer: Self-pay

## 2019-10-25 ENCOUNTER — Encounter: Payer: Self-pay | Admitting: Internal Medicine

## 2019-10-25 NOTE — Telephone Encounter (Signed)
Per Dr. [name] DO , Creatinine has  improved from 2.0 on 10/15/19

## 2019-10-28 DIAGNOSIS — Z20828 Contact with and (suspected) exposure to other viral communicable diseases: Secondary | ICD-10-CM | POA: Diagnosis not present

## 2019-10-28 DIAGNOSIS — Z9189 Other specified personal risk factors, not elsewhere classified: Secondary | ICD-10-CM | POA: Diagnosis not present

## 2019-11-04 DIAGNOSIS — Z20828 Contact with and (suspected) exposure to other viral communicable diseases: Secondary | ICD-10-CM | POA: Diagnosis not present

## 2019-11-04 DIAGNOSIS — Z9189 Other specified personal risk factors, not elsewhere classified: Secondary | ICD-10-CM | POA: Diagnosis not present

## 2019-11-13 DIAGNOSIS — Z23 Encounter for immunization: Secondary | ICD-10-CM | POA: Diagnosis not present

## 2019-11-18 ENCOUNTER — Ambulatory Visit (INDEPENDENT_AMBULATORY_CARE_PROVIDER_SITE_OTHER): Payer: Medicare Other | Admitting: *Deleted

## 2019-11-18 DIAGNOSIS — Z950 Presence of cardiac pacemaker: Secondary | ICD-10-CM | POA: Diagnosis not present

## 2019-11-18 LAB — CUP PACEART REMOTE DEVICE CHECK
Battery Remaining Longevity: 31 mo
Battery Remaining Percentage: 25 %
Battery Voltage: 2.8 V
Brady Statistic AP VP Percent: 1 %
Brady Statistic AP VS Percent: 72 %
Brady Statistic AS VP Percent: 1 %
Brady Statistic AS VS Percent: 27 %
Brady Statistic RA Percent Paced: 15 %
Brady Statistic RV Percent Paced: 15 %
Date Time Interrogation Session: 20210215020010
Implantable Lead Implant Date: 20120119
Implantable Lead Implant Date: 20120119
Implantable Lead Location: 753859
Implantable Lead Location: 753860
Implantable Lead Model: 1948
Implantable Pulse Generator Implant Date: 20120119
Lead Channel Impedance Value: 380 Ohm
Lead Channel Impedance Value: 690 Ohm
Lead Channel Pacing Threshold Amplitude: 0.625 V
Lead Channel Pacing Threshold Amplitude: 1 V
Lead Channel Pacing Threshold Pulse Width: 0.5 ms
Lead Channel Pacing Threshold Pulse Width: 0.5 ms
Lead Channel Sensing Intrinsic Amplitude: 2.4 mV
Lead Channel Sensing Intrinsic Amplitude: 8.8 mV
Lead Channel Setting Pacing Amplitude: 1.625
Lead Channel Setting Pacing Amplitude: 2.5 V
Lead Channel Setting Pacing Pulse Width: 0.5 ms
Lead Channel Setting Sensing Sensitivity: 2 mV
Pulse Gen Model: 2210
Pulse Gen Serial Number: 7198677

## 2019-11-19 DIAGNOSIS — L821 Other seborrheic keratosis: Secondary | ICD-10-CM | POA: Diagnosis not present

## 2019-11-19 DIAGNOSIS — D1801 Hemangioma of skin and subcutaneous tissue: Secondary | ICD-10-CM | POA: Diagnosis not present

## 2019-11-19 DIAGNOSIS — L570 Actinic keratosis: Secondary | ICD-10-CM | POA: Diagnosis not present

## 2019-11-19 DIAGNOSIS — L820 Inflamed seborrheic keratosis: Secondary | ICD-10-CM | POA: Diagnosis not present

## 2019-11-19 NOTE — Progress Notes (Signed)
PPM Remote  

## 2019-11-29 ENCOUNTER — Encounter: Payer: Self-pay | Admitting: Internal Medicine

## 2019-11-29 DIAGNOSIS — I480 Paroxysmal atrial fibrillation: Secondary | ICD-10-CM | POA: Diagnosis not present

## 2019-11-29 DIAGNOSIS — E039 Hypothyroidism, unspecified: Secondary | ICD-10-CM | POA: Diagnosis not present

## 2019-11-29 LAB — COMPREHENSIVE METABOLIC PANEL
Albumin: 3.7 (ref 3.5–5.0)
Calcium: 8.8 (ref 8.7–10.7)
Globulin: 2.7

## 2019-11-29 LAB — BASIC METABOLIC PANEL
BUN: 25 — AB (ref 4–21)
CO2: 29 — AB (ref 13–22)
Chloride: 106 (ref 99–108)
Creatinine: 1.7 — AB (ref 0.5–1.1)
Glucose: 100
Potassium: 4.3 (ref 3.4–5.3)
Sodium: 145 (ref 137–147)

## 2019-11-29 LAB — TSH: TSH: 4.09 (ref 0.41–5.90)

## 2019-12-05 ENCOUNTER — Telehealth (INDEPENDENT_AMBULATORY_CARE_PROVIDER_SITE_OTHER): Payer: Medicare Other | Admitting: Internal Medicine

## 2019-12-05 ENCOUNTER — Other Ambulatory Visit: Payer: Self-pay

## 2019-12-05 VITALS — Ht 64.0 in | Wt 206.0 lb

## 2019-12-05 DIAGNOSIS — I4819 Other persistent atrial fibrillation: Secondary | ICD-10-CM

## 2019-12-05 DIAGNOSIS — Z950 Presence of cardiac pacemaker: Secondary | ICD-10-CM | POA: Diagnosis not present

## 2019-12-05 DIAGNOSIS — I495 Sick sinus syndrome: Secondary | ICD-10-CM | POA: Diagnosis not present

## 2019-12-05 NOTE — Progress Notes (Signed)
Electrophysiology TeleHealth Note   Due to national recommendations of social distancing due to COVID 19, an audio/video telehealth visit is felt to be most appropriate for this patient at this time.  See MyChart message from today for the patient's consent to telehealth for [facility].   Date:  12/05/2019   ID:  Cindy Cervantes, DOB Dec 28, 1931, MRN [id number]  Location: patient's home  Provider location: [address] [place]  Evaluation Performed: Follow-up visit  PCP:  Gayland Curry, DO  Cardiologist:     Electrophysiologist:  SK   Chief Complaint:  Atrial fibrillation   History of Present Illness:    Cindy Cervantes is a 86 y.o. female who presents via audio/video conferencing for a telehealth visit today.  Since last being seen in our clinic increasing heart failure in the context of PVC and increasing atrial fibrillation following the discontinuation of amio for tremors which did not improve and so was resumed  the patient reports being weak and shakey; quite dyspneic w exertion, awakening with dry mouth on CPAP,  No edema.  Difficult response to Covid 2--dysphagia, chest tightness  Amiodarone down to 1 day  Also discussed AV ablation Date Cr K TSH LFTs Hgb  6/17    3.25 6    12/18 1.6 3.8 3.24 4 10.9  6/19 1.8 4.4 3.65    8/20 1.76  2.63    1/21 1.7 4.7 3.58      The patient denies symptoms of fevers, chills, cough, or new SOB worrisome for COVID 19.    Past Medical History:  Diagnosis Date  . Allergic rhinitis   . Anemia of renal disease 10/21/2011  . Anemia, iron deficiency 10/21/2011  . Cholelithiasis   . Chronic diastolic heart failure ([place])   . Colon polyp   . Constipation   . Cough    2nd to reflux  . Debility, unspecified   . Dehydration   . Depression   . Diaphragm dysfunction    Right hemidiaphragm  . Disorder of bone and cartilage, unspecified   . Diverticulosis of colon   . Dysphagia   . GERD (gastroesophageal  reflux disease) 02/08/2013  . History of hyperkalemia in setting of spironolactone 09/2010  . Hyperlipidemia   . Hypertension   . Hypopotassemia   . Hypothyroidism 02/08/2013  . Long term (current) use of anticoagulants   . MDS (myelodysplastic syndrome), low grade ([place]) 10/21/2011  . Obesity   . Obesity hypoventilation syndrome ([place])   . OSA (obstructive sleep apnea)   . Osteoarthrosis, unspecified whether generalized or localized, unspecified site   . Other specified disease of white blood cells   . Pacemaker    Implanted December 2012  . Paroxysmal A-fib (HCC) with RVR   Amiodarone  . Reflux esophagitis   . Rosacea 05/15/2013  . Senile cataract   . Sinus node dysfunction (HCC)   . Tracheobronchomalacia   . Unspecified vitamin D deficiency   . Urinary incontinence   . Xerostomia     Past Surgical History:  Procedure Laterality Date  . CATARACT EXTRACTION  2008  . PACEMAKER PLACEMENT  10/21/2010   STJ, dural chamber Dr. [name]  . TOTAL KNEE ARTHROPLASTY  1999   right knee  . TOTAL KNEE ARTHROPLASTY  2003   left knee  . TUBAL LIGATION  1964  . Tummy tuck  2000   pt "almost died"; respiratory distress, became delrious    Current Outpatient Medications  Medication Sig  Dispense Refill  . acetaminophen (TYLENOL) 325 MG tablet Take 650 mg by mouth every 4 (four) hours as needed.     Marland Kitchen amiodarone (PACERONE) 200 MG tablet Take 400mg  twice daily x 2wks; then take 200mg  daily x2wks;then take 200mg  daily 90 tablet 3  . ezetimibe (ZETIA) 10 MG tablet Take 10 mg by mouth daily.    . fluticasone (FLONASE) 50 MCG/ACT nasal spray Place 2 sprays into the nose 2 (two) times daily. 2 sprays in each nostril    . levothyroxine (SYNTHROID, LEVOTHROID) 75 MCG tablet Take 75 mcg by mouth daily before breakfast.     . polyethylene glycol powder (MIRALAX) powder Take 17 g by mouth as needed for mild constipation.     . potassium chloride SA (K-DUR,KLOR-CON) 20 MEQ tablet Take 40 mEq by mouth daily.     . Rivaroxaban (XARELTO) 15 MG TABS tablet Take 1 tablet (15 mg total) by mouth daily with supper. 30 tablet   . sertraline (ZOLOFT) 25 MG tablet Take 25 mg by mouth daily.    Marland Kitchen torsemide (DEMADEX) 20 MG tablet Take 20 mg by mouth daily.    Marland Kitchen allopurinol (ZYLOPRIM) 100 MG tablet Take 100 mg by mouth daily.    . metoprolol tartrate (LOPRESSOR) 25 MG tablet Take 1 tablet (25 mg total) by mouth 2 (two) times daily for 21 days. 42 tablet 0   No current facility-administered medications for this visit.    Allergies:   Codeine, Iron, Morphine, and Toviaz [fesoterodine fumarate]   Social History:  The patient  reports that she quit smoking about 37 years ago. Her smoking use included cigarettes. She smoked 0.50 packs per day. She has never used smokeless tobacco. She reports current alcohol use. She reports that she does not use drugs.   Family History:  The patient's   family history includes Heart disease in her brother and father; Parkinson's disease in her brother and sister.   ROS:  Please see the history of present illness.   All other systems are personally reviewed and negative.    Exam:    Vital Signs:  Ht 5\' 4"  (1.626 m)   Wt 206 lb (93.4 kg)   BMI 35.36 kg/m     Labs/Other Tests and Data Reviewed:    Recent Labs: 05/20/2019: Hemoglobin 13.0; Platelets 217 10/15/2019: TSH 3.58 10/23/2019: BUN 24; Creatinine 1.7; Potassium 4.7; Sodium 138   Wt Readings from Last 3 Encounters:  12/05/19 206 lb (93.4 kg)  10/16/19 203 lb (92.1 kg)  09/05/19 203 lb 12.8 oz (92.4 kg)     Other studies personally reviewed:   Last device remote is reviewed from [facility] PDF dated 12/20 which reveals normal device function,   arrhythmias - PVC7.4% and atrial fibrillation   ASSESSMENT & PLAN:   Pacemaker-[employer]   Atrial fibrillation-persistent  HFpEF  acutechronic  Treated hypothyroidism  PVCs-frequent (4.7%)   Tremor  Renal insufficiency grade 4  Her tremulousness and weakness  are concerning for ongoing atrial fibrillation,  The last interrogation from 2/21 seems to have been missed so will arrange  She is not quite sure how much amiodarone she is taking will need to check, but does not think the amiodarone has made her tremors worse  Amio surveillance labs  TSH ok but liver labs are needed  Volume status seems to be ok, but with her other symptoms its hard to tell   COVID 19 screen The patient denies symptoms of COVID 19 at this time.  The importance of social distancing was discussed today.  Follow-up:  20m Next remote: NOW  Current medicines are reviewed at length with the patient today.   The patient does not have concerns regarding her medicines.  The following changes were made today:  none  Labs/ tests ordered today include: liver profile No orders of the defined types were placed in this encounter.     Patient Risk:  after full review of this patients clinical status, I feel that they are at moderate risk at this time.  Today, I have spent 11 minutes with the patient with telehealth technology discussing the above.  Signed, Virl Axe, MD  12/05/2019 3:15 PM     [facility] [address] [place] [address] [phone] (office) [phone] (fax)

## 2019-12-06 ENCOUNTER — Encounter: Payer: Self-pay | Admitting: Internal Medicine

## 2019-12-06 ENCOUNTER — Telehealth: Payer: Self-pay | Admitting: Emergency Medicine

## 2019-12-06 ENCOUNTER — Telehealth: Payer: Self-pay

## 2019-12-06 NOTE — Telephone Encounter (Signed)
-----   Message from Deboraha Sprang, MD sent at 12/05/2019  5:06 PM EST ----- Ladies  can you get her to transmit  it looks like the transmission from Feb 25 never made it and am wondering if she is in afib  Thanks SK

## 2019-12-06 NOTE — Telephone Encounter (Signed)
Remote transmission reviewed for 12/06/19. Remote shows ongoing AF with controlled v-rates that is ongoing. AT/AF burden is 79%.

## 2019-12-06 NOTE — Telephone Encounter (Signed)
Called and spoke with pt, she is aware of appt 3/8 @9 :30 with Roderic Palau, NP.

## 2019-12-06 NOTE — Telephone Encounter (Addendum)
Patient currently in AF with AF burden of 78%. Previous AMS episode began October 17, 2019 and lasted 31 day 18 hours and 10 minutes. Patient had video visit with Dr [name] 12/05/19 and has had tiredness and SOB with activity.  Dr [name] reviewed today's transmission and requested that patient be referred to AF Clinic to set up cardioversion next week. + Xarelto and no missed doses. Patient aware of treatment plan of AF referral and possible cardioversion and will expect call from AF clinic.

## 2019-12-06 NOTE — Telephone Encounter (Signed)
Per Dr. [name] Creatinine is much improved 1.71, left message for patient to call back for result.

## 2019-12-06 NOTE — Telephone Encounter (Signed)
I helped the pt send a manual transmission with her home monitor per dr. [name] request.Transmission received. I told her the nurse will review it and give her a call back.

## 2019-12-09 ENCOUNTER — Other Ambulatory Visit: Payer: Self-pay

## 2019-12-09 ENCOUNTER — Encounter (HOSPITAL_COMMUNITY): Payer: Self-pay | Admitting: Nurse Practitioner

## 2019-12-09 ENCOUNTER — Ambulatory Visit (HOSPITAL_COMMUNITY)
Admission: RE | Admit: 2019-12-09 | Discharge: 2019-12-09 | Disposition: A | Discharge status: Home / Self Care | Payer: Medicare Other | Source: Ambulatory Visit | Attending: Nurse Practitioner | Admitting: Nurse Practitioner

## 2019-12-09 VITALS — BP 168/90 | HR 90 | Ht 64.0 in | Wt 204.8 lb

## 2019-12-09 DIAGNOSIS — Z87891 Personal history of nicotine dependence: Secondary | ICD-10-CM | POA: Diagnosis not present

## 2019-12-09 DIAGNOSIS — E662 Morbid (severe) obesity with alveolar hypoventilation: Secondary | ICD-10-CM | POA: Diagnosis not present

## 2019-12-09 DIAGNOSIS — F329 Major depressive disorder, single episode, unspecified: Secondary | ICD-10-CM | POA: Diagnosis not present

## 2019-12-09 DIAGNOSIS — Z8249 Family history of ischemic heart disease and other diseases of the circulatory system: Secondary | ICD-10-CM | POA: Diagnosis not present

## 2019-12-09 DIAGNOSIS — I110 Hypertensive heart disease with heart failure: Secondary | ICD-10-CM | POA: Diagnosis not present

## 2019-12-09 DIAGNOSIS — Z6835 Body mass index (BMI) 35.0-35.9, adult: Secondary | ICD-10-CM | POA: Diagnosis not present

## 2019-12-09 DIAGNOSIS — Z820 Family history of epilepsy and other diseases of the nervous system: Secondary | ICD-10-CM | POA: Diagnosis not present

## 2019-12-09 DIAGNOSIS — I5032 Chronic diastolic (congestive) heart failure: Secondary | ICD-10-CM | POA: Insufficient documentation

## 2019-12-09 DIAGNOSIS — Z96653 Presence of artificial knee joint, bilateral: Secondary | ICD-10-CM | POA: Diagnosis not present

## 2019-12-09 DIAGNOSIS — Z79899 Other long term (current) drug therapy: Secondary | ICD-10-CM | POA: Insufficient documentation

## 2019-12-09 DIAGNOSIS — M1990 Unspecified osteoarthritis, unspecified site: Secondary | ICD-10-CM | POA: Diagnosis not present

## 2019-12-09 DIAGNOSIS — Z79890 Hormone replacement therapy (postmenopausal): Secondary | ICD-10-CM | POA: Insufficient documentation

## 2019-12-09 DIAGNOSIS — Z950 Presence of cardiac pacemaker: Secondary | ICD-10-CM | POA: Diagnosis not present

## 2019-12-09 DIAGNOSIS — E039 Hypothyroidism, unspecified: Secondary | ICD-10-CM | POA: Diagnosis not present

## 2019-12-09 DIAGNOSIS — Z7901 Long term (current) use of anticoagulants: Secondary | ICD-10-CM | POA: Diagnosis not present

## 2019-12-09 DIAGNOSIS — Z86010 Personal history of colonic polyps: Secondary | ICD-10-CM | POA: Insufficient documentation

## 2019-12-09 DIAGNOSIS — D6869 Other thrombophilia: Secondary | ICD-10-CM | POA: Diagnosis not present

## 2019-12-09 DIAGNOSIS — Z888 Allergy status to other drugs, medicaments and biological substances status: Secondary | ICD-10-CM | POA: Insufficient documentation

## 2019-12-09 DIAGNOSIS — I4819 Other persistent atrial fibrillation: Secondary | ICD-10-CM | POA: Diagnosis not present

## 2019-12-09 DIAGNOSIS — D469 Myelodysplastic syndrome, unspecified: Secondary | ICD-10-CM | POA: Insufficient documentation

## 2019-12-09 DIAGNOSIS — Z885 Allergy status to narcotic agent status: Secondary | ICD-10-CM | POA: Insufficient documentation

## 2019-12-09 DIAGNOSIS — E785 Hyperlipidemia, unspecified: Secondary | ICD-10-CM | POA: Insufficient documentation

## 2019-12-09 LAB — COMPREHENSIVE METABOLIC PANEL
ALT: 13 U/L (ref 0–44)
AST: 16 U/L (ref 15–41)
Albumin: 3.6 g/dL (ref 3.5–5.0)
Alkaline Phosphatase: 82 U/L (ref 38–126)
Anion gap: 10 (ref 5–15)
BUN: 18 mg/dL (ref 8–23)
CO2: 25 mmol/L (ref 22–32)
Calcium: 8.8 mg/dL — ABNORMAL LOW (ref 8.9–10.3)
Chloride: 107 mmol/L (ref 98–111)
Creatinine, Ser: 1.98 mg/dL — ABNORMAL HIGH (ref 0.44–1.00)
GFR calc Af Amer: 26 mL/min — ABNORMAL LOW (ref >60)
GFR calc non Af Amer: 22 mL/min — ABNORMAL LOW (ref >60)
Glucose, Bld: 122 mg/dL — ABNORMAL HIGH (ref 70–99)
Potassium: 4.3 mmol/L (ref 3.5–5.1)
Sodium: 142 mmol/L (ref 135–145)
Total Bilirubin: 0.7 mg/dL (ref 0.3–1.2)
Total Protein: 6.7 g/dL (ref 6.5–8.1)

## 2019-12-09 LAB — CBC
HCT: 41.9 % (ref 36.0–46.0)
Hemoglobin: 12.8 g/dL (ref 12.0–15.0)
MCH: 31.4 pg (ref 26.0–34.0)
MCHC: 30.5 g/dL (ref 30.0–36.0)
MCV: 102.9 fL — ABNORMAL HIGH (ref 80.0–100.0)
Platelets: 195 10*3/uL (ref 150–400)
RBC: 4.07 MIL/uL (ref 3.87–5.11)
RDW: 14.5 % (ref 11.5–15.5)
WBC: 8.1 10*3/uL (ref 4.0–10.5)
nRBC: 0 % (ref 0.0–0.2)

## 2019-12-09 MED ORDER — AMIODARONE HCL 200 MG PO TABS: ORAL_TABLET | ORAL | Status: DC

## 2019-12-09 NOTE — Progress Notes (Signed)
Primary Care Physician: Gayland Curry, DO Referring Physician: Dr. Rosaria Ferries is a 86 y.o. female with a h/o paroxysmal afib that is in the afib clinic to be scheduled for cardioversion per Dr. [name] as her recent interogation showed persistent  afib. SHe had been on amiodarone, which was stopped after pt c/o of hand tremors. Tremors did not improve off amiodarone so this was reloaded a few weeks ago when seen by Dr. [name]. She is now on 200 mg daily and remains in afib so plans will be put in place for cardioversion. No missed doses of xarelto daily for at least 3 weeks with a CHA2DS2VASc score of at least 5.She lives  at [facility]  Today, she denies symptoms of palpitations, chest pain, shortness of breath, orthopnea, PND, lower extremity edema, dizziness, presyncope, syncope, or neurologic sequela. The patient is tolerating medications without difficulties and is otherwise without complaint today.   Past Medical History:  Diagnosis Date  . Allergic rhinitis   . Anemia of renal disease 10/21/2011  . Anemia, iron deficiency 10/21/2011  . Cholelithiasis   . Chronic diastolic heart failure ([place])   . Colon polyp   . Constipation   . Cough    2nd to reflux  . Debility, unspecified   . Dehydration   . Depression   . Diaphragm dysfunction    Right hemidiaphragm  . Disorder of bone and cartilage, unspecified   . Diverticulosis of colon   . Dysphagia   . GERD (gastroesophageal reflux disease) 02/08/2013  . History of hyperkalemia in setting of spironolactone 09/2010  . Hyperlipidemia   . Hypertension   . Hypopotassemia   . Hypothyroidism 02/08/2013  . Long term (current) use of anticoagulants   . MDS (myelodysplastic syndrome), low grade ([place]) 10/21/2011  . Obesity   . Obesity hypoventilation syndrome ([place])   . OSA (obstructive sleep apnea)   . Osteoarthrosis, unspecified whether generalized or localized, unspecified site   . Other specified disease of white blood cells    . Pacemaker    Implanted December 2012  . Paroxysmal A-fib (HCC) with RVR   Amiodarone  . Reflux esophagitis   . Rosacea 05/15/2013  . Senile cataract   . Sinus node dysfunction ([facility])   . Tracheobronchomalacia   . Unspecified vitamin D deficiency   . Urinary incontinence   . Xerostomia    Past Surgical History:  Procedure Laterality Date  . CATARACT EXTRACTION  2008  . PACEMAKER PLACEMENT  10/21/2010   STJ, dural chamber Dr. [name]  . TOTAL KNEE ARTHROPLASTY  1999   right knee  . TOTAL KNEE ARTHROPLASTY  2003   left knee  . TUBAL LIGATION  1964  . Tummy tuck  2000   pt "almost died"; respiratory distress, became delrious    Current Outpatient Medications  Medication Sig Dispense Refill  . acetaminophen (TYLENOL) 325 MG tablet Take 650 mg by mouth as needed.     Marland Kitchen allopurinol (ZYLOPRIM) 100 MG tablet Take 100 mg by mouth daily.    Marland Kitchen amiodarone (PACERONE) 200 MG tablet Taking 200mg  by mouth daily    . ezetimibe (ZETIA) 10 MG tablet Take 10 mg by mouth daily.    . fluticasone (FLONASE) 50 MCG/ACT nasal spray Place 2 sprays into the nose 2 (two) times daily. 2 sprays in each nostril (Patient taking differently: Place 2 sprays into the nose as needed. 2 sprays in each nostril)    . levothyroxine (SYNTHROID, LEVOTHROID) 75 MCG  tablet Take 75 mcg by mouth daily before breakfast.     . polyethylene glycol powder (MIRALAX) powder Take 17 g by mouth as needed for mild constipation.     . potassium chloride SA (K-DUR,KLOR-CON) 20 MEQ tablet Take 40 mEq by mouth daily.    . Rivaroxaban (XARELTO) 15 MG TABS tablet Take 1 tablet (15 mg total) by mouth daily with supper. 30 tablet   . sertraline (ZOLOFT) 25 MG tablet Take 25 mg by mouth daily.    Marland Kitchen torsemide (DEMADEX) 20 MG tablet Take 20 mg by mouth daily.     No current facility-administered medications for this encounter.    Allergies  Allergen Reactions  . Codeine     Extreme lethargy  . Iron     By infusion  . Morphine   .  Toviaz [Fesoterodine Fumarate]     Social History   Socioeconomic History  . Marital status: Widowed    Spouse name: Not on file  . Number of children: Not on file  . Years of education: Not on file  . Highest education level: Not on file  Occupational History  . Occupation: retired    Fish farm manager: RETIRED    Comment: Network engineer   Tobacco Use  . Smoking status: Former Smoker    Packs/day: 0.50    Types: Cigarettes    Quit date: 10/03/1982    Years since quitting: 37.2  . Smokeless tobacco: Never Used  Substance and Sexual Activity  . Alcohol use: Yes    Comment: very rarely.  none in one year  . Drug use: No  . Sexual activity: Never  Other Topics Concern  . Not on file  Social History Narrative   Widowed 2002 (married 1955), originally form [place]. 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 Moved from [place], [place] to [place], [place] section 2009. Moved to [place] section 2012.    Non smoker, mininmal alcohol.    Has POA   Power wheelchair, walker   Exercise none               Social Determinants of Health   Financial Resource Strain:   . Difficulty of Paying Living Expenses: Not on file  Food Insecurity:   . Worried About Charity fundraiser in the Last Year: Not on file  . Ran Out of Food in the Last Year: Not on file  Transportation Needs:   . Lack of Transportation (Medical): Not on file  . Lack of Transportation (Non-Medical): Not on file  Physical Activity:   . Days of Exercise per Week: Not on file  . Minutes of Exercise per Session: Not on file  Stress:   . Feeling of Stress : Not on file  Social Connections:   . Frequency of Communication with Friends and Family: Not on file  . Frequency of Social Gatherings with Friends and Family: Not on file  . Attends Religious Services: Not on file  . Active Member of Clubs or Organizations: Not on file  . Attends Archivist  Meetings: Not on file  . Marital Status: Not on file  Intimate Partner Violence:   . Fear of Current or Ex-Partner: Not on file  . Emotionally Abused: Not on file  . Physically Abused: Not on file  . Sexually Abused: Not on file    Family History  Problem Relation Age of Onset  . Heart disease Father   . Parkinson's disease Sister   .  Heart disease Brother   . Parkinson's disease Brother     ROS- All systems are reviewed and negative except as per the HPI above  Physical Exam: Vitals:   12/09/19 0942  BP: (!) 168/90  Pulse: 90  Weight: 92.9 kg  Height: 5\' 4"  (1.626 m)   Wt Readings from Last 3 Encounters:  12/09/19 92.9 kg  12/05/19 93.4 kg  10/16/19 92.1 kg    Labs: Lab Results  Component Value Date   NA 145 11/29/2019   K 4.3 11/29/2019   CL 106 11/29/2019   CO2 29 (A) 11/29/2019   GLUCOSE 90 05/20/2019   BUN 25 (A) 11/29/2019   CREATININE 1.7 (A) 11/29/2019   CALCIUM 8.8 11/29/2019   MG 1.7 09/22/2010   Lab Results  Component Value Date   INR 2.9 (A) 09/17/2014   Lab Results  Component Value Date   CHOL 201 (A) 03/06/2017   HDL 57 03/06/2017   LDLCALC 129 03/06/2017   TRIG 73 03/06/2017     GEN- The patient is well appearing, alert and oriented x 3 today.   Head- normocephalic, atraumatic Eyes-  Sclera clear, conjunctiva pink Ears- hearing intact Oropharynx- clear Neck- supple, no JVP Lymph- no cervical lymphadenopathy Lungs- Clear to ausculation bilaterally, normal work of breathing Heart- irregular rate and rhythm, no murmurs, rubs or gallops, PMI not laterally displaced GI- soft, NT, ND, + BS Extremities- no clubbing, cyanosis, or edema MS- no significant deformity or atrophy Skin- no rash or lesion Psych- euthymic mood, full affect Neuro- strength and sensation are intact  EKG-afib at 90 bpm, qrs int 108 ms, qtc 452 ms Epic records reviewed    Assessment and Plan: 1. Persistent afib Now back on amiodarone 200 mg daily Will  schedule for cardioversion Risk vrs benefit  for DCCV discussed and pt would like to proceed Cbc/cmet/covid scheduled  2. CHA2DS2VASc score of at least 5 States no missed doses of xarelto 15 mg daily  for at least 3 weeks  I will ask for pt to send a device transmission one week after cardioversion to evaluate rhythm after cardioversion  Butch Penny C. Pura Picinich, [facility] [address] [place], [place] [address] [phone]

## 2019-12-09 NOTE — Patient Instructions (Signed)
Cardioversion scheduled for Tuesday. March 16th  - Arrive at the [facility] and go to admitting at 8:30AM  -Do not eat or drink anything after midnight the night prior to your procedure.  - Take all your morning medication with a sip of water prior to arrival.  - You will not be able to drive home after your procedure.   Send transmission from your pacemaker device on March 23rd and we will review for follow up.

## 2019-12-09 NOTE — H&P (View-Only) (Signed)
Primary Care Physician: Gayland Curry, DO Referring Physician: Dr. Rosaria Ferries is a 86 y.o. female with a h/o paroxysmal afib that is in the afib clinic to be scheduled for cardioversion per Dr. [name] as her recent interogation showed persistent  afib. SHe had been on amiodarone, which was stopped after pt c/o of hand tremors. Tremors did not improve off amiodarone so this was reloaded a few weeks ago when seen by Dr. [name]. She is now on 200 mg daily and remains in afib so plans will be put in place for cardioversion. No missed doses of xarelto daily for at least 3 weeks with a CHA2DS2VASc score of at least 5.She lives  at [facility]  Today, she denies symptoms of palpitations, chest pain, shortness of breath, orthopnea, PND, lower extremity edema, dizziness, presyncope, syncope, or neurologic sequela. The patient is tolerating medications without difficulties and is otherwise without complaint today.   Past Medical History:  Diagnosis Date  . Allergic rhinitis   . Anemia of renal disease 10/21/2011  . Anemia, iron deficiency 10/21/2011  . Cholelithiasis   . Chronic diastolic heart failure ([place])   . Colon polyp   . Constipation   . Cough    2nd to reflux  . Debility, unspecified   . Dehydration   . Depression   . Diaphragm dysfunction    Right hemidiaphragm  . Disorder of bone and cartilage, unspecified   . Diverticulosis of colon   . Dysphagia   . GERD (gastroesophageal reflux disease) 02/08/2013  . History of hyperkalemia in setting of spironolactone 09/2010  . Hyperlipidemia   . Hypertension   . Hypopotassemia   . Hypothyroidism 02/08/2013  . Long term (current) use of anticoagulants   . MDS (myelodysplastic syndrome), low grade ([place]) 10/21/2011  . Obesity   . Obesity hypoventilation syndrome ([place])   . OSA (obstructive sleep apnea)   . Osteoarthrosis, unspecified whether generalized or localized, unspecified site   . Other specified disease of white blood cells    . Pacemaker    Implanted December 2012  . Paroxysmal A-fib (H[facility]) with RVR   Amiodarone  . Reflux esophagitis   . Rosacea 05/15/2013  . Senile cataract   . Sinus node dysfunction ([facility])   . Tracheobronchomalacia   . Unspecified vitamin D deficiency   . Urinary incontinence   . Xerostomia    Past Surgical History:  Procedure Laterality Date  . CATARACT EXTRACTION  2008  . PACEMAKER PLACEMENT  10/21/2010   STJ, dural chamber Dr. [name]  . TOTAL KNEE ARTHROPLASTY  1999   right knee  . TOTAL KNEE ARTHROPLASTY  2003   left knee  . TUBAL LIGATION  1964  . Tummy tuck  2000   pt "almost died"; respiratory distress, became delrious    Current Outpatient Medications  Medication Sig Dispense Refill  . acetaminophen (TYLENOL) 325 MG tablet Take 650 mg by mouth as needed.     Marland Kitchen allopurinol (ZYLOPRIM) 100 MG tablet Take 100 mg by mouth daily.    Marland Kitchen amiodarone (PACERONE) 200 MG tablet Taking 200mg  by mouth daily    . ezetimibe (ZETIA) 10 MG tablet Take 10 mg by mouth daily.    . fluticasone (FLONASE) 50 MCG/ACT nasal spray Place 2 sprays into the nose 2 (two) times daily. 2 sprays in each nostril (Patient taking differently: Place 2 sprays into the nose as needed. 2 sprays in each nostril)    . levothyroxine (SYNTHROID, LEVOTHROID) 75 MCG  tablet Take 75 mcg by mouth daily before breakfast.     . polyethylene glycol powder (MIRALAX) powder Take 17 g by mouth as needed for mild constipation.     . potassium chloride SA (K-DUR,KLOR-CON) 20 MEQ tablet Take 40 mEq by mouth daily.    . Rivaroxaban (XARELTO) 15 MG TABS tablet Take 1 tablet (15 mg total) by mouth daily with supper. 30 tablet   . sertraline (ZOLOFT) 25 MG tablet Take 25 mg by mouth daily.    Marland Kitchen torsemide (DEMADEX) 20 MG tablet Take 20 mg by mouth daily.     No current facility-administered medications for this encounter.    Allergies  Allergen Reactions  . Codeine     Extreme lethargy  . Iron     By infusion  . Morphine   .  Toviaz [Fesoterodine Fumarate]     Social History   Socioeconomic History  . Marital status: Widowed    Spouse name: Not on file  . Number of children: Not on file  . Years of education: Not on file  . Highest education level: Not on file  Occupational History  . Occupation: retired    Fish farm manager: RETIRED    Comment: Network engineer   Tobacco Use  . Smoking status: Former Smoker    Packs/day: 0.50    Types: Cigarettes    Quit date: 10/03/1982    Years since quitting: 37.2  . Smokeless tobacco: Never Used  Substance and Sexual Activity  . Alcohol use: Yes    Comment: very rarely.  none in one year  . Drug use: No  . Sexual activity: Never  Other Topics Concern  . Not on file  Social History Narrative   Widowed 2002 (married 1955), originally form [place]. 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 Moved from [place], [place] to [place], [place] section 2009. Moved to [place] section 2012.    Non smoker, mininmal alcohol.    Has POA   Power wheelchair, walker   Exercise none               Social Determinants of Health   Financial Resource Strain:   . Difficulty of Paying Living Expenses: Not on file  Food Insecurity:   . Worried About Charity fundraiser in the Last Year: Not on file  . Ran Out of Food in the Last Year: Not on file  Transportation Needs:   . Lack of Transportation (Medical): Not on file  . Lack of Transportation (Non-Medical): Not on file  Physical Activity:   . Days of Exercise per Week: Not on file  . Minutes of Exercise per Session: Not on file  Stress:   . Feeling of Stress : Not on file  Social Connections:   . Frequency of Communication with Friends and Family: Not on file  . Frequency of Social Gatherings with Friends and Family: Not on file  . Attends Religious Services: Not on file  . Active Member of Clubs or Organizations: Not on file  . Attends Archivist  Meetings: Not on file  . Marital Status: Not on file  Intimate Partner Violence:   . Fear of Current or Ex-Partner: Not on file  . Emotionally Abused: Not on file  . Physically Abused: Not on file  . Sexually Abused: Not on file    Family History  Problem Relation Age of Onset  . Heart disease Father   . Parkinson's disease Sister   .  Heart disease Brother   . Parkinson's disease Brother     ROS- All systems are reviewed and negative except as per the HPI above  Physical Exam: Vitals:   12/09/19 0942  BP: (!) 168/90  Pulse: 90  Weight: 92.9 kg  Height: 5\' 4"  (1.626 m)   Wt Readings from Last 3 Encounters:  12/09/19 92.9 kg  12/05/19 93.4 kg  10/16/19 92.1 kg    Labs: Lab Results  Component Value Date   NA 145 11/29/2019   K 4.3 11/29/2019   CL 106 11/29/2019   CO2 29 (A) 11/29/2019   GLUCOSE 90 05/20/2019   BUN 25 (A) 11/29/2019   CREATININE 1.7 (A) 11/29/2019   CALCIUM 8.8 11/29/2019   MG 1.7 09/22/2010   Lab Results  Component Value Date   INR 2.9 (A) 09/17/2014   Lab Results  Component Value Date   CHOL 201 (A) 03/06/2017   HDL 57 03/06/2017   LDLCALC 129 03/06/2017   TRIG 73 03/06/2017     GEN- The patient is well appearing, alert and oriented x 3 today.   Head- normocephalic, atraumatic Eyes-  Sclera clear, conjunctiva pink Ears- hearing intact Oropharynx- clear Neck- supple, no JVP Lymph- no cervical lymphadenopathy Lungs- Clear to ausculation bilaterally, normal work of breathing Heart- irregular rate and rhythm, no murmurs, rubs or gallops, PMI not laterally displaced GI- soft, NT, ND, + BS Extremities- no clubbing, cyanosis, or edema MS- no significant deformity or atrophy Skin- no rash or lesion Psych- euthymic mood, full affect Neuro- strength and sensation are intact  EKG-afib at 90 bpm, qrs int 108 ms, qtc 452 ms Epic records reviewed    Assessment and Plan: 1. Persistent afib Now back on amiodarone 200 mg daily Will  schedule for cardioversion Risk vrs benefit  for DCCV discussed and pt would like to proceed Cbc/cmet/covid scheduled  2. CHA2DS2VASc score of at least 5 States no missed doses of xarelto 15 mg daily  for at least 3 weeks  I will ask for pt to send a device transmission one week after cardioversion to evaluate rhythm after cardioversion  Butch Penny C. Jong Rickman, [facility] [address] [place], [place] [address] [phone]

## 2019-12-11 ENCOUNTER — Telehealth: Payer: Self-pay | Admitting: *Deleted

## 2019-12-11 NOTE — Telephone Encounter (Signed)
-----   Message from Juluis Mire, RN sent at 12/10/2019  2:22 PM EST ----- Regarding: device transmission Butch Penny would like this patient to send a remote transmission on 3/23 for her to have reviewed to ensure pt is maintaining NSR after cardioversion. Thank you! Marzetta Board

## 2019-12-11 NOTE — Telephone Encounter (Signed)
Spoke with patient. Scheduled for automatic transmission on 12/24/19. Pt aware that if transmission is not received, our office will call. Pt denies any questions or concerns at this time.

## 2019-12-12 ENCOUNTER — Telehealth: Payer: Medicare Other | Admitting: Internal Medicine

## 2019-12-13 ENCOUNTER — Other Ambulatory Visit (HOSPITAL_COMMUNITY)
Admission: RE | Admit: 2019-12-13 | Discharge: 2019-12-13 | Disposition: A | Discharge status: Home / Self Care | Payer: Medicare Other | Source: Ambulatory Visit | Attending: Cardiovascular Disease | Admitting: Cardiovascular Disease

## 2019-12-13 DIAGNOSIS — Z01812 Encounter for preprocedural laboratory examination: Secondary | ICD-10-CM | POA: Diagnosis not present

## 2019-12-13 DIAGNOSIS — Z20822 Contact with and (suspected) exposure to covid-19: Secondary | ICD-10-CM | POA: Diagnosis not present

## 2019-12-13 LAB — SARS CORONAVIRUS 2 (TAT 6-24 HRS): SARS Coronavirus 2: NEGATIVE

## 2019-12-17 ENCOUNTER — Other Ambulatory Visit: Payer: Self-pay

## 2019-12-17 ENCOUNTER — Ambulatory Visit (HOSPITAL_COMMUNITY): Payer: Medicare Other | Admitting: Certified Registered Nurse Anesthetist

## 2019-12-17 ENCOUNTER — Encounter (HOSPITAL_COMMUNITY)
Admission: RE | Disposition: A | Discharge status: Home / Self Care | Payer: Self-pay | Source: Home / Self Care | Attending: Cardiovascular Disease

## 2019-12-17 ENCOUNTER — Ambulatory Visit (HOSPITAL_COMMUNITY)
Admission: RE | Admit: 2019-12-17 | Discharge: 2019-12-17 | Disposition: A | Discharge status: Home / Self Care | Payer: Medicare Other | Attending: Cardiovascular Disease | Admitting: Cardiovascular Disease

## 2019-12-17 ENCOUNTER — Encounter (HOSPITAL_COMMUNITY): Payer: Self-pay | Admitting: Cardiovascular Disease

## 2019-12-17 DIAGNOSIS — Z79890 Hormone replacement therapy (postmenopausal): Secondary | ICD-10-CM | POA: Diagnosis not present

## 2019-12-17 DIAGNOSIS — Z7901 Long term (current) use of anticoagulants: Secondary | ICD-10-CM | POA: Diagnosis not present

## 2019-12-17 DIAGNOSIS — E669 Obesity, unspecified: Secondary | ICD-10-CM | POA: Diagnosis not present

## 2019-12-17 DIAGNOSIS — I110 Hypertensive heart disease with heart failure: Secondary | ICD-10-CM | POA: Insufficient documentation

## 2019-12-17 DIAGNOSIS — G4733 Obstructive sleep apnea (adult) (pediatric): Secondary | ICD-10-CM | POA: Insufficient documentation

## 2019-12-17 DIAGNOSIS — I4819 Other persistent atrial fibrillation: Secondary | ICD-10-CM | POA: Insufficient documentation

## 2019-12-17 DIAGNOSIS — Z79899 Other long term (current) drug therapy: Secondary | ICD-10-CM | POA: Insufficient documentation

## 2019-12-17 DIAGNOSIS — I495 Sick sinus syndrome: Secondary | ICD-10-CM | POA: Insufficient documentation

## 2019-12-17 DIAGNOSIS — K219 Gastro-esophageal reflux disease without esophagitis: Secondary | ICD-10-CM | POA: Diagnosis not present

## 2019-12-17 DIAGNOSIS — I4891 Unspecified atrial fibrillation: Secondary | ICD-10-CM | POA: Diagnosis not present

## 2019-12-17 DIAGNOSIS — E785 Hyperlipidemia, unspecified: Secondary | ICD-10-CM | POA: Diagnosis not present

## 2019-12-17 DIAGNOSIS — I10 Essential (primary) hypertension: Secondary | ICD-10-CM | POA: Diagnosis not present

## 2019-12-17 DIAGNOSIS — E039 Hypothyroidism, unspecified: Secondary | ICD-10-CM | POA: Insufficient documentation

## 2019-12-17 DIAGNOSIS — Z6835 Body mass index (BMI) 35.0-35.9, adult: Secondary | ICD-10-CM | POA: Diagnosis not present

## 2019-12-17 DIAGNOSIS — Z950 Presence of cardiac pacemaker: Secondary | ICD-10-CM | POA: Insufficient documentation

## 2019-12-17 DIAGNOSIS — F329 Major depressive disorder, single episode, unspecified: Secondary | ICD-10-CM | POA: Diagnosis not present

## 2019-12-17 DIAGNOSIS — Z87891 Personal history of nicotine dependence: Secondary | ICD-10-CM | POA: Insufficient documentation

## 2019-12-17 DIAGNOSIS — I5032 Chronic diastolic (congestive) heart failure: Secondary | ICD-10-CM | POA: Diagnosis not present

## 2019-12-17 HISTORY — PX: CARDIOVERSION: SHX1299

## 2019-12-17 SURGERY — CARDIOVERSION
Anesthesia: General

## 2019-12-17 MED ORDER — PROPOFOL 10 MG/ML IV BOLUS
INTRAVENOUS | Status: DC | PRN
  Administered 2019-12-17: 70 mg via INTRAVENOUS

## 2019-12-17 MED ORDER — LIDOCAINE 2% (20 MG/ML) 5 ML SYRINGE
INTRAMUSCULAR | Status: DC | PRN
  Administered 2019-12-17: 100 mg via INTRAVENOUS

## 2019-12-17 NOTE — Anesthesia Preprocedure Evaluation (Addendum)
Anesthesia Evaluation  Patient identified by MRN, date of birth, ID band Patient awake    Reviewed: Allergy & Precautions, NPO status , Patient's Chart, lab work & pertinent test results  History of Anesthesia Complications Negative for: history of anesthetic complications  Airway Mallampati: I  TM Distance: >3 FB Neck ROM: Full    Dental no notable dental hx.    Pulmonary sleep apnea , former smoker,    Pulmonary exam normal        Cardiovascular hypertension, Normal cardiovascular exam+ dysrhythmias Atrial Fibrillation + pacemaker  Rhythm:Irregular Rate:Tachycardia     Neuro/Psych Depression negative neurological ROS     GI/Hepatic Neg liver ROS, GERD  ,  Endo/Other  Hypothyroidism   Renal/GU Renal InsufficiencyRenal disease (Cr 1.98)  negative genitourinary   Musculoskeletal negative musculoskeletal ROS (+)   Abdominal   Peds  Hematology  (+) anemia , Myelodysplastic syndrome   Anesthesia Other Findings   Reproductive/Obstetrics                            Anesthesia Physical Anesthesia Plan  ASA: III  Anesthesia Plan: General   Post-op Pain Management:    Induction: Intravenous  PONV Risk Score and Plan: 3 and TIVA and Treatment may vary due to age or medical condition  Airway Management Planned: Mask  Additional Equipment: None  Intra-op Plan:   Post-operative Plan:   Informed Consent: I have reviewed the patients History and Physical, chart, labs and discussed the procedure including the risks, benefits and alternatives for the proposed anesthesia with the patient or authorized representative who has indicated his/her understanding and acceptance.       Plan Discussed with: CRNA  Anesthesia Plan Comments:        Anesthesia Quick Evaluation

## 2019-12-17 NOTE — Discharge Instructions (Signed)
Electrical Cardioversion  Follow these instructions at home:  Do not drive for 24 hours if you were given a sedative during your procedure.  Take over-the-counter and prescription medicines only as told by your health care provider.  Ask your health care provider how to check your pulse. Check it often.  Rest for 48 hours after the procedure or as told by your health care provider.  Avoid or limit your caffeine use as told by your health care provider.  Keep all follow-up visits as told by your health care provider. This is important. Contact a health care provider if:  You feel like your heart is beating too quickly or your pulse is not regular.  You have a serious muscle cramp that does not go away. Get help right away if:  You have discomfort in your chest.  You are dizzy or you feel faint.  You have trouble breathing or you are short of breath.  Your speech is slurred.  You have trouble moving an arm or leg on one side of your body.  Your fingers or toes turn cold or blue. Summary  Electrical cardioversion is the delivery of a jolt of electricity to restore a normal rhythm to the heart.  This procedure may be done right away in an emergency or may be a scheduled procedure if the condition is not an emergency.  Generally, this is a safe procedure.  After the procedure, check your pulse often as told by your health care provider. This information is not intended to replace advice given to you by your health care provider. Make sure you discuss any questions you have with your health care provider. Document Revised: 04/22/2019 Document Reviewed: 04/22/2019 Elsevier Patient Education  2020 Elsevier Inc.  

## 2019-12-17 NOTE — Interval H&P Note (Signed)
History and Physical Interval Note:  12/17/2019 9:28 AM  Cindy Cervantes  has presented today for surgery, with the diagnosis of AFIB.  The various methods of treatment have been discussed with the patient and family. After consideration of risks, benefits and other options for treatment, the patient has consented to  Procedure(s): CARDIOVERSION (N/A) as a surgical intervention.  The patient's history has been reviewed, patient examined, no change in status, stable for surgery.  I have reviewed the patient's chart and labs.  Questions were answered to the patient's satisfaction.     Skeet Latch, MD

## 2019-12-17 NOTE — Anesthesia Postprocedure Evaluation (Signed)
Anesthesia Post Note  Patient: Cindy Cervantes  Procedure(s) Performed: CARDIOVERSION (N/A )     Patient location during evaluation: Endoscopy Anesthesia Type: General Level of consciousness: awake and alert Pain management: pain level controlled Vital Signs Assessment: post-procedure vital signs reviewed and stable Respiratory status: spontaneous breathing, nonlabored ventilation and respiratory function stable Cardiovascular status: blood pressure returned to baseline and stable Postop Assessment: no apparent nausea or vomiting Anesthetic complications: no    Last Vitals:  Vitals:   12/17/19 1000 12/17/19 1008  BP: 127/63 (!) 161/68  Pulse: 65 63  Resp: 15 19  Temp:    SpO2: 95% 95%    Last Pain:  Vitals:   12/17/19 0924  TempSrc: Oral  PainSc: 0-No pain                 Lidia Collum

## 2019-12-17 NOTE — Anesthesia Procedure Notes (Signed)
Procedure Name: General with mask airway Date/Time: 12/17/2019 9:42 AM Performed by: Wilburn Cornelia, CRNA Pre-anesthesia Checklist: Patient identified, Timeout performed, Patient being monitored, Emergency Drugs available and Suction available Patient Re-evaluated:Patient Re-evaluated prior to induction Oxygen Delivery Method: Ambu bag Preoxygenation: Pre-oxygenation with 100% oxygen Induction Type: IV induction Ventilation: Mask ventilation without difficulty Placement Confirmation: positive ETCO2 and breath sounds checked- equal and bilateral Dental Injury: Teeth and Oropharynx as per pre-operative assessment

## 2019-12-17 NOTE — Transfer of Care (Signed)
Immediate Anesthesia Transfer of Care Note  Patient: Cindy Cervantes  Procedure(s) Performed: CARDIOVERSION (N/A )  Patient Location: Endoscopy Unit  Anesthesia Type:General  Level of Consciousness: awake, alert  and oriented  Airway & Oxygen Therapy: Patient Spontanous Breathing  Post-op Assessment: Report given to RN and Post -op Vital signs reviewed and stable  Post vital signs: Reviewed and stable  Last Vitals:  Vitals Value Taken Time  BP    Temp    Pulse    Resp    SpO2      Last Pain:  Vitals:   12/17/19 0924  TempSrc: Oral  PainSc: 0-No pain         Complications: No apparent anesthesia complications

## 2019-12-17 NOTE — CV Procedure (Signed)
Electrical Cardioversion Procedure Note Cindy Cervantes OS:[id number] October 06, 1931  Procedure: Electrical Cardioversion Indications:  Atrial Fibrillation  Procedure Details Consent: Risks of procedure as well as the alternatives and risks of each were explained to the (patient/caregiver).  Consent for procedure obtained. Time Out: Verified patient identification, verified procedure, site/side was marked, verified correct patient position, special equipment/implants available, medications/allergies/relevent history reviewed, required imaging and test results available.  Performed  Patient placed on cardiac monitor, pulse oximetry, supplemental oxygen as necessary.  Sedation given: propfol Pacer pads placed anterior and posterior chest.  Cardioverted 1 time(s).  Cardioverted at 150J.  Evaluation Findings: Post procedure EKG shows: AP VP Complications: None Patient did tolerate procedure well.   Skeet Latch, MD 12/17/2019, 9:45 AM

## 2019-12-20 ENCOUNTER — Telehealth: Payer: Self-pay

## 2019-12-20 NOTE — Telephone Encounter (Signed)
   Pt back in AF by presenting EGM, appears to have started yesterday ~3 pm.   Can she be scheduled for follow up with AF clinic to discuss next steps. ? If just needs longer load on amio.   Thank yall!  Legrand Como [address]" [place], PA-C  12/20/2019 10:18 AM

## 2019-12-20 NOTE — Telephone Encounter (Signed)
The nurse at [facility] thinks the pt may be in A-fib. I told her to send a transmission and to call my direct office number to let me know it was sent. I told her I will have the nurse to review it.

## 2019-12-20 NOTE — Telephone Encounter (Signed)
The nurse Sherri from [facility] states the transmission was sent. Transmission received. If someone can review the transmission and give them a call back. The phone number for Venida Jarvis is [phone].

## 2019-12-20 NOTE — Telephone Encounter (Signed)
Spoke with Venida Jarvis, Nurse @[facility] .  She is aware of appt for pt 12/24/19 with Roderic Palau, NP 1:30 pm.

## 2019-12-24 ENCOUNTER — Other Ambulatory Visit: Payer: Self-pay

## 2019-12-24 ENCOUNTER — Encounter (HOSPITAL_COMMUNITY): Payer: Self-pay | Admitting: Nurse Practitioner

## 2019-12-24 ENCOUNTER — Ambulatory Visit (HOSPITAL_COMMUNITY)
Admission: RE | Admit: 2019-12-24 | Discharge: 2019-12-24 | Disposition: A | Discharge status: Home / Self Care | Payer: Medicare Other | Source: Ambulatory Visit | Attending: Nurse Practitioner | Admitting: Nurse Practitioner

## 2019-12-24 ENCOUNTER — Ambulatory Visit (INDEPENDENT_AMBULATORY_CARE_PROVIDER_SITE_OTHER): Payer: Medicare Other | Admitting: *Deleted

## 2019-12-24 VITALS — BP 146/74 | HR 65 | Ht 64.0 in | Wt 206.4 lb

## 2019-12-24 DIAGNOSIS — I4819 Other persistent atrial fibrillation: Secondary | ICD-10-CM | POA: Diagnosis not present

## 2019-12-24 DIAGNOSIS — Z8249 Family history of ischemic heart disease and other diseases of the circulatory system: Secondary | ICD-10-CM | POA: Diagnosis not present

## 2019-12-24 DIAGNOSIS — I5032 Chronic diastolic (congestive) heart failure: Secondary | ICD-10-CM | POA: Diagnosis not present

## 2019-12-24 DIAGNOSIS — Z7901 Long term (current) use of anticoagulants: Secondary | ICD-10-CM | POA: Diagnosis not present

## 2019-12-24 DIAGNOSIS — D631 Anemia in chronic kidney disease: Secondary | ICD-10-CM | POA: Insufficient documentation

## 2019-12-24 DIAGNOSIS — Z950 Presence of cardiac pacemaker: Secondary | ICD-10-CM

## 2019-12-24 DIAGNOSIS — E876 Hypokalemia: Secondary | ICD-10-CM | POA: Diagnosis not present

## 2019-12-24 DIAGNOSIS — M1990 Unspecified osteoarthritis, unspecified site: Secondary | ICD-10-CM | POA: Insufficient documentation

## 2019-12-24 DIAGNOSIS — D6869 Other thrombophilia: Secondary | ICD-10-CM

## 2019-12-24 DIAGNOSIS — E039 Hypothyroidism, unspecified: Secondary | ICD-10-CM | POA: Diagnosis not present

## 2019-12-24 DIAGNOSIS — Z79899 Other long term (current) drug therapy: Secondary | ICD-10-CM | POA: Insufficient documentation

## 2019-12-24 DIAGNOSIS — E785 Hyperlipidemia, unspecified: Secondary | ICD-10-CM | POA: Insufficient documentation

## 2019-12-24 DIAGNOSIS — F329 Major depressive disorder, single episode, unspecified: Secondary | ICD-10-CM | POA: Diagnosis not present

## 2019-12-24 DIAGNOSIS — K219 Gastro-esophageal reflux disease without esophagitis: Secondary | ICD-10-CM | POA: Diagnosis not present

## 2019-12-24 DIAGNOSIS — I110 Hypertensive heart disease with heart failure: Secondary | ICD-10-CM | POA: Insufficient documentation

## 2019-12-24 DIAGNOSIS — Z87891 Personal history of nicotine dependence: Secondary | ICD-10-CM | POA: Diagnosis not present

## 2019-12-24 LAB — CUP PACEART REMOTE DEVICE CHECK
Battery Remaining Longevity: 31 mo
Battery Remaining Percentage: 25 %
Battery Voltage: 2.8 V
Brady Statistic AP VP Percent: 1 %
Brady Statistic AP VS Percent: 72 %
Brady Statistic AS VP Percent: 1 %
Brady Statistic AS VS Percent: 27 %
Brady Statistic RA Percent Paced: 15 %
Brady Statistic RV Percent Paced: 12 %
Date Time Interrogation Session: 20210323031539
Implantable Lead Implant Date: 20120119
Implantable Lead Implant Date: 20120119
Implantable Lead Location: 753859
Implantable Lead Location: 753860
Implantable Lead Model: 1948
Implantable Pulse Generator Implant Date: 20120119
Lead Channel Impedance Value: 400 Ohm
Lead Channel Impedance Value: 660 Ohm
Lead Channel Pacing Threshold Amplitude: 0.5 V
Lead Channel Pacing Threshold Amplitude: 1 V
Lead Channel Pacing Threshold Pulse Width: 0.5 ms
Lead Channel Pacing Threshold Pulse Width: 0.5 ms
Lead Channel Sensing Intrinsic Amplitude: 2 mV
Lead Channel Sensing Intrinsic Amplitude: 9.6 mV
Lead Channel Setting Pacing Amplitude: 1.5 V
Lead Channel Setting Pacing Amplitude: 2.5 V
Lead Channel Setting Pacing Pulse Width: 0.5 ms
Lead Channel Setting Sensing Sensitivity: 2 mV
Pulse Gen Model: 2210
Pulse Gen Serial Number: 7198677

## 2019-12-24 MED ORDER — METOPROLOL SUCCINATE ER 25 MG PO TB24: 25.0000 mg | ORAL_TABLET | Freq: Every day | ORAL | 6 refills | Status: AC

## 2019-12-24 NOTE — Patient Instructions (Signed)
Stop Amiodarone  Start Metoprolol 25mg  once a day at bedtime  Send transmission on Friday, April 2nd

## 2019-12-25 ENCOUNTER — Encounter (HOSPITAL_COMMUNITY): Payer: Self-pay | Admitting: Nurse Practitioner

## 2019-12-25 NOTE — Progress Notes (Signed)
Primary Care Physician: Cindy Curry, DO Referring Physician: Dr. [name] is a 86 y.o. female with a h/o paroxysmal afib that is in the afib clinic to be scheduled for cardioversion per Dr. Caryl Cervantes as her recent interogation showed persistent  afib. SHe had been on amiodarone, which was stopped after pt c/o of hand tremors. Tremors did not improve off amiodarone so this was reloaded in December when seen by Dr. Caryl Cervantes. She is now on 200 mg daily and remains in afib so plans will be put in place for cardioversion. No missed doses of xarelto daily for at least 3 weeks with a CHA2DS2VASc score of at least 5.She lives  at [facility]  F/u in afib clinic, 12/24/19. She  had a successful cardioversion 3/16, but report sent to device clinic 3/19 showed return to afib. She is here in the office today and a report sent this am showed afib burden at 79 % with controlled rates, and 8 hours of afib this am. Interesting enough, she is in SR in the clinic. I spoke to Dr. Caryl Cervantes, and because of the refractoriness of her  afib with amiodarone, he felt it best to rate control and did not see the benefit of repeat cardioversion.   Today, she denies symptoms of palpitations, chest pain, shortness of breath, orthopnea, PND, lower extremity edema, dizziness, presyncope, syncope, or neurologic sequela. The patient is tolerating medications without difficulties and is otherwise without complaint today.   Past Medical History:  Diagnosis Date  . Allergic rhinitis   . Anemia of renal disease 10/21/2011  . Anemia, iron deficiency 10/21/2011  . Cholelithiasis   . Chronic diastolic heart failure ([place])   . Colon polyp   . Constipation   . Cough    2nd to reflux  . Debility, unspecified   . Dehydration   . Depression   . Diaphragm dysfunction    Right hemidiaphragm  . Disorder of bone and cartilage, unspecified   . Diverticulosis of colon   . Dysphagia   . GERD (gastroesophageal reflux disease)  02/08/2013  . History of hyperkalemia in setting of spironolactone 09/2010  . Hyperlipidemia   . Hypertension   . Hypopotassemia   . Hypothyroidism 02/08/2013  . Long term (current) use of anticoagulants   . MDS (myelodysplastic syndrome), low grade ([place]) 10/21/2011  . Obesity   . Obesity hypoventilation syndrome ([place])   . OSA (obstructive sleep apnea)   . Osteoarthrosis, unspecified whether generalized or localized, unspecified site   . Other specified disease of white blood cells   . Pacemaker    Implanted December 2012  . Paroxysmal A-fib (HCC) with RVR   Amiodarone  . Reflux esophagitis   . Rosacea 05/15/2013  . Senile cataract   . Sinus node dysfunction (HCC)   . Tracheobronchomalacia   . Unspecified vitamin D deficiency   . Urinary incontinence   . Xerostomia    Past Surgical History:  Procedure Laterality Date  . CARDIOVERSION N/A 12/17/2019   Procedure: CARDIOVERSION;  Surgeon: Skeet Latch, MD;  Location: [facility];  Service: Cardiovascular;  Laterality: N/A;  . CATARACT EXTRACTION  2008  . PACEMAKER PLACEMENT  10/21/2010   STJ, dural chamber Dr. Caryl Cervantes  . TOTAL KNEE ARTHROPLASTY  1999   right knee  . TOTAL KNEE ARTHROPLASTY  2003   left knee  . TUBAL LIGATION  1964  . Tummy tuck  2000   pt "almost died"; respiratory distress, became delrious  Current Outpatient Medications  Medication Sig Dispense Refill  . acetaminophen (TYLENOL) 325 MG tablet Take 325-650 mg by mouth every 4 (four) hours as needed for moderate pain.     Marland Kitchen allopurinol (ZYLOPRIM) 100 MG tablet Take 100 mg by mouth daily.    Marland Kitchen dextromethorphan-guaiFENesin (MUCINEX DM) 30-600 MG 12hr tablet Take 1 tablet by mouth 2 (two) times daily as needed (cough / congestion).    . ezetimibe (ZETIA) 10 MG tablet Take 10 mg by mouth every evening.     . fluticasone (FLONASE) 50 MCG/ACT nasal spray Place 2 sprays into the nose 2 (two) times daily. 2 sprays in each nostril (Patient taking differently: Place 2  sprays into the nose in the morning and at bedtime. )    . levothyroxine (SYNTHROID, LEVOTHROID) 75 MCG tablet Take 75 mcg by mouth daily before breakfast.     . polyethylene glycol powder (MIRALAX) powder Take 17 g by mouth as needed for mild constipation.     . potassium chloride SA (K-DUR,KLOR-CON) 20 MEQ tablet Take 40 mEq by mouth daily.    . Rivaroxaban (XARELTO) 15 MG TABS tablet Take 1 tablet (15 mg total) by mouth daily with supper. 30 tablet   . sertraline (ZOLOFT) 25 MG tablet Take 25 mg by mouth at bedtime.     . torsemide (DEMADEX) 20 MG tablet Take 20 mg by mouth daily.    . metoprolol succinate (TOPROL XL) 25 MG 24 hr tablet Take 1 tablet (25 mg total) by mouth at bedtime. 30 tablet 6   No current facility-administered medications for this encounter.    Allergies  Allergen Reactions  . Codeine     Extreme lethargy  . Iron     By infusion - back spasms   . Morphine     Hallucinations, altered mental state  . Toviaz [Fesoterodine Fumarate]     hyperactive    Social History   Socioeconomic History  . Marital status: Widowed    Spouse name: Not on file  . Number of children: Not on file  . Years of education: Not on file  . Highest education level: Not on file  Occupational History  . Occupation: retired    Fish farm manager: RETIRED    Comment: Network engineer   Tobacco Use  . Smoking status: Former Smoker    Packs/day: 0.50    Types: Cigarettes    Quit date: 10/03/1982    Years since quitting: 37.2  . Smokeless tobacco: Never Used  Substance and Sexual Activity  . Alcohol use: Yes    Comment: very rarely.  none in one year  . Drug use: No  . Sexual activity: Never  Other Topics Concern  . Not on file  Social History Narrative   Widowed 2002 (married 1955), originally form [place]. 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 Moved from [place], [place] to [place], [place] section 2009. Moved to [place]  Living section 2012.    Non smoker, mininmal alcohol.    Has POA   Power wheelchair, walker   Exercise none               Social Determinants of Health   Financial Resource Strain:   . Difficulty of Paying Living Expenses:   Food Insecurity:   . Worried About Charity fundraiser in the Last Year:   . Arboriculturist in the Last Year:   Transportation Needs:   . Film/video editor (Medical):   Marland Kitchen  Lack of Transportation (Non-Medical):   Physical Activity:   . Days of Exercise per Week:   . Minutes of Exercise per Session:   Stress:   . Feeling of Stress :   Social Connections:   . Frequency of Communication with Friends and Family:   . Frequency of Social Gatherings with Friends and Family:   . Attends Religious Services:   . Active Member of Clubs or Organizations:   . Attends Archivist Meetings:   Marland Kitchen Marital Status:   Intimate Partner Violence:   . Fear of Current or Ex-Partner:   . Emotionally Abused:   Marland Kitchen Physically Abused:   . Sexually Abused:     Family History  Problem Relation Age of Onset  . Heart disease Father   . Parkinson's disease Sister   . Heart disease Brother   . Parkinson's disease Brother     ROS- All systems are reviewed and negative except as per the HPI above  Physical Exam: Vitals:   12/24/19 1334  BP: (!) 146/74  Pulse: 65  Weight: 93.6 kg  Height: 5\' 4"  (1.626 m)   Wt Readings from Last 3 Encounters:  12/24/19 93.6 kg  12/17/19 92.5 kg  12/09/19 92.9 kg    Labs: Lab Results  Component Value Date   NA 142 12/09/2019   K 4.3 12/09/2019   CL 107 12/09/2019   CO2 25 12/09/2019   GLUCOSE 122 (H) 12/09/2019   BUN 18 12/09/2019   CREATININE 1.98 (H) 12/09/2019   CALCIUM 8.8 (L) 12/09/2019   MG 1.7 09/22/2010   Lab Results  Component Value Date   INR 2.9 (A) 09/17/2014   Lab Results  Component Value Date   CHOL 201 (A) 03/06/2017   HDL 57 03/06/2017   LDLCALC 129 03/06/2017   TRIG 73 03/06/2017      GEN- The patient is well appearing, alert and oriented x 3 today.   Head- normocephalic, atraumatic Eyes-  Sclera clear, conjunctiva pink Ears- hearing intact Oropharynx- clear Neck- supple, no JVP Lymph- no cervical lymphadenopathy Lungs- Clear to ausculation bilaterally, normal work of breathing Heart- regular rate and rhythm, no murmurs, rubs or gallops, PMI not laterally displaced GI- soft, NT, ND, + BS Extremities- no clubbing, cyanosis, or edema MS- no significant deformity or atrophy Skin- no rash or lesion Psych- euthymic mood, full affect Neuro- strength and sensation are intact  EKG- NSR at 65 bpm, PR int 166 ms, qrs int 118 ms, qtc 436 ms. Device report sent  this am shows 79% afib burden, with 8 hours of afib earlier today Epic records reviewed    Assessment and Plan: 1. Persistent afib Has been on amiodarone for a long time, interrupted in the fall for possible cause of tremors When tremors did not significantly improve, she was reloaded on amiodarone in December  Device report in February showed persistent afib Successful cardioversion 3/16, but was back in afib by device report 3/19 and in afib earlier today with afib burden of 79% Discussed with Dr. Caryl Cervantes and he agrees to stop amiodarone and rate control as she appears to have refractory afib  Will start metoprolol ER  25 mg at hs Will arrange for pt to send a device report in 10 days  2. CHA2DS2VASc score of at least 5 Continue xarelto 15 mg daily     Butch Penny C. Carlisia Geno, [facility] [address] [place], [place] [address] [phone]

## 2019-12-25 NOTE — Progress Notes (Signed)
PPM Remote  

## 2019-12-25 NOTE — Addendum Note (Signed)
Addended by: Jennette Banker on: 12/25/2019 10:22 AM   Modules accepted: Level of Service

## 2020-01-03 ENCOUNTER — Ambulatory Visit (INDEPENDENT_AMBULATORY_CARE_PROVIDER_SITE_OTHER): Payer: Medicare Other | Admitting: *Deleted

## 2020-01-03 DIAGNOSIS — I4819 Other persistent atrial fibrillation: Secondary | ICD-10-CM

## 2020-01-03 LAB — CUP PACEART REMOTE DEVICE CHECK
Battery Remaining Longevity: 28 mo
Battery Remaining Percentage: 22 %
Battery Voltage: 2.78 V
Brady Statistic AP VP Percent: 1 %
Brady Statistic AP VS Percent: 79 %
Brady Statistic AS VP Percent: 1 %
Brady Statistic AS VS Percent: 20 %
Brady Statistic RA Percent Paced: 21 %
Brady Statistic RV Percent Paced: 11 %
Date Time Interrogation Session: 20210402020010
Implantable Lead Implant Date: 20120119
Implantable Lead Implant Date: 20120119
Implantable Lead Location: 753859
Implantable Lead Location: 753860
Implantable Lead Model: 1948
Implantable Pulse Generator Implant Date: 20120119
Lead Channel Impedance Value: 440 Ohm
Lead Channel Impedance Value: 710 Ohm
Lead Channel Pacing Threshold Amplitude: 0.5 V
Lead Channel Pacing Threshold Amplitude: 1 V
Lead Channel Pacing Threshold Pulse Width: 0.5 ms
Lead Channel Pacing Threshold Pulse Width: 0.5 ms
Lead Channel Sensing Intrinsic Amplitude: 1.6 mV
Lead Channel Sensing Intrinsic Amplitude: 9.6 mV
Lead Channel Setting Pacing Amplitude: 1.5 V
Lead Channel Setting Pacing Amplitude: 2.5 V
Lead Channel Setting Pacing Pulse Width: 0.5 ms
Lead Channel Setting Sensing Sensitivity: 2 mV
Pulse Gen Model: 2210
Pulse Gen Serial Number: 7198677

## 2020-01-29 DIAGNOSIS — I10 Essential (primary) hypertension: Secondary | ICD-10-CM | POA: Diagnosis not present

## 2020-01-29 DIAGNOSIS — H5203 Hypermetropia, bilateral: Secondary | ICD-10-CM | POA: Diagnosis not present

## 2020-01-29 DIAGNOSIS — H524 Presbyopia: Secondary | ICD-10-CM | POA: Diagnosis not present

## 2020-01-29 DIAGNOSIS — H43393 Other vitreous opacities, bilateral: Secondary | ICD-10-CM | POA: Diagnosis not present

## 2020-01-29 DIAGNOSIS — H35033 Hypertensive retinopathy, bilateral: Secondary | ICD-10-CM | POA: Diagnosis not present

## 2020-02-17 ENCOUNTER — Ambulatory Visit (INDEPENDENT_AMBULATORY_CARE_PROVIDER_SITE_OTHER): Payer: Medicare Other | Admitting: *Deleted

## 2020-02-17 DIAGNOSIS — I495 Sick sinus syndrome: Secondary | ICD-10-CM | POA: Diagnosis not present

## 2020-02-17 LAB — CUP PACEART REMOTE DEVICE CHECK
Battery Remaining Longevity: 23 mo
Battery Remaining Percentage: 19 %
Battery Voltage: 2.77 V
Brady Statistic AP VP Percent: 1 %
Brady Statistic AP VS Percent: 90 %
Brady Statistic AS VP Percent: 1 %
Brady Statistic AS VS Percent: 9.1 %
Brady Statistic RA Percent Paced: 41 %
Brady Statistic RV Percent Paced: 8.4 %
Date Time Interrogation Session: 20210517072431
Implantable Lead Implant Date: 20120119
Implantable Lead Implant Date: 20120119
Implantable Lead Location: 753859
Implantable Lead Location: 753860
Implantable Lead Model: 1948
Implantable Pulse Generator Implant Date: 20120119
Lead Channel Impedance Value: 430 Ohm
Lead Channel Impedance Value: 690 Ohm
Lead Channel Pacing Threshold Amplitude: 0.5 V
Lead Channel Pacing Threshold Amplitude: 1 V
Lead Channel Pacing Threshold Pulse Width: 0.5 ms
Lead Channel Pacing Threshold Pulse Width: 0.5 ms
Lead Channel Sensing Intrinsic Amplitude: 1.1 mV
Lead Channel Sensing Intrinsic Amplitude: 10.1 mV
Lead Channel Setting Pacing Amplitude: 1.5 V
Lead Channel Setting Pacing Amplitude: 2.5 V
Lead Channel Setting Pacing Pulse Width: 0.5 ms
Lead Channel Setting Sensing Sensitivity: 2 mV
Pulse Gen Model: 2210
Pulse Gen Serial Number: 7198677

## 2020-02-18 NOTE — Progress Notes (Signed)
Remote pacemaker transmission.   

## 2020-02-19 DIAGNOSIS — L570 Actinic keratosis: Secondary | ICD-10-CM | POA: Diagnosis not present

## 2020-02-19 DIAGNOSIS — L821 Other seborrheic keratosis: Secondary | ICD-10-CM | POA: Diagnosis not present

## 2020-02-19 DIAGNOSIS — D1801 Hemangioma of skin and subcutaneous tissue: Secondary | ICD-10-CM | POA: Diagnosis not present

## 2020-03-26 ENCOUNTER — Telehealth (INDEPENDENT_AMBULATORY_CARE_PROVIDER_SITE_OTHER): Payer: Medicare Other | Admitting: Internal Medicine

## 2020-03-26 ENCOUNTER — Telehealth: Payer: Self-pay

## 2020-03-26 ENCOUNTER — Other Ambulatory Visit: Payer: Self-pay

## 2020-03-26 VITALS — BP 150/79 | HR 60 | Temp 97.3°F | Wt 207.0 lb

## 2020-03-26 DIAGNOSIS — I5032 Chronic diastolic (congestive) heart failure: Secondary | ICD-10-CM

## 2020-03-26 DIAGNOSIS — Z950 Presence of cardiac pacemaker: Secondary | ICD-10-CM | POA: Diagnosis not present

## 2020-03-26 DIAGNOSIS — I4819 Other persistent atrial fibrillation: Secondary | ICD-10-CM

## 2020-03-26 DIAGNOSIS — I493 Ventricular premature depolarization: Secondary | ICD-10-CM | POA: Diagnosis not present

## 2020-03-26 NOTE — Progress Notes (Signed)
Electrophysiology TeleHealth Note   Due to national recommendations of social distancing due to COVID 19, an audio/video telehealth visit is felt to be most appropriate for this patient at this time.  See MyChart message from today for the patient's consent to telehealth for [facility].   Date:  03/26/2020   ID:  Cindy Cervantes, DOB 1932-05-15, MRN [id number]  Location: patient's home  Provider location: [address] [place]  Evaluation Performed: Follow-up visit  PCP:  Gayland Curry, DO  Cardiologist:     Electrophysiologist:  SK   Chief Complaint:  Atrial fibrillation   History of Present Illness:    Cindy Cervantes is a 86 y.o. female who presents via audio/video conferencing for a telehealth visit today.  Since last being seen in our clinic for pacemaker implant atrial fibrillation and dyspnea --exertional not assoc with swelling of abd/edema --  increasing heart failure in the context of PVC and increasing atrial fibrillation following the discontinuation of amio for tremors which did not improve; still on O2 at night with CPAP    Not much improvement following cardioversion 3/21  Not very ambulatory; gained some weight   Chest pain infrequent   Date Cr K TSH LFTs Hgb  6/17      3.25 6    12/18 1.6 3.8 3.24 4 10.9  6/19 1.8 4.4 3.65      8/20 1.76   2.63      1/21  1.7 4.7 3.58      3/21 1.98 4.3 4.09 (2/21) 13      The patient denies symptoms of fevers, chills, cough, or new SOB worrisome for COVID 19.    Past Medical History:  Diagnosis Date   Allergic rhinitis    Anemia of renal disease 10/21/2011   Anemia, iron deficiency 10/21/2011   Cholelithiasis    Chronic diastolic heart failure (HCC)    Colon polyp    Constipation    Cough    2nd to reflux   Debility, unspecified    Dehydration    Depression    Diaphragm dysfunction    Right hemidiaphragm   Disorder of bone and cartilage, unspecified    Diverticulosis of colon     Dysphagia    GERD (gastroesophageal reflux disease) 02/08/2013   History of hyperkalemia in setting of spironolactone 09/2010   Hyperlipidemia    Hypertension    Hypopotassemia    Hypothyroidism 02/08/2013   Long term (current) use of anticoagulants    MDS (myelodysplastic syndrome), low grade ([place]) 10/21/2011   Obesity    Obesity hypoventilation syndrome (HCC)    OSA (obstructive sleep apnea)    Osteoarthrosis, unspecified whether generalized or localized, unspecified site    Other specified disease of white blood cells    Pacemaker    Implanted December 2012   Paroxysmal A-fib [facility]) with RVR   Amiodarone   Reflux esophagitis    Rosacea 05/15/2013   Senile cataract    Sinus node dysfunction ([place])    Tracheobronchomalacia    Unspecified vitamin D deficiency    Urinary incontinence    Xerostomia     Past Surgical History:  Procedure Laterality Date   CARDIOVERSION N/A 12/17/2019   Procedure: CARDIOVERSION;  Surgeon: Skeet Latch, MD;  Location: [facility];  Service: Cardiovascular;  Laterality: N/A;   CATARACT EXTRACTION  2008   PACEMAKER PLACEMENT  10/21/2010   STJ, dural chamber Dr. [name]   TOTAL KNEE ARTHROPLASTY  1999  right knee   TOTAL KNEE ARTHROPLASTY  01/14/02   left knee   TUBAL LIGATION  1964   Tummy tuck  01-15-99   pt "almost died"; respiratory distress, became delrious    Current Outpatient Medications  Medication Sig Dispense Refill   acetaminophen (TYLENOL) 325 MG tablet Take 325-650 mg by mouth every 4 (four) hours as needed for moderate pain.      allopurinol (ZYLOPRIM) 100 MG tablet Take 100 mg by mouth daily.     dextromethorphan-guaiFENesin (MUCINEX DM) 30-600 MG 12hr tablet Take 1 tablet by mouth 2 (two) times daily as needed (cough / congestion).     ezetimibe (ZETIA) 10 MG tablet Take 10 mg by mouth every evening.      fluticasone (FLONASE) 50 MCG/ACT nasal spray Place 2 sprays into the nose 2 (two) times daily. 2 sprays in each nostril (Patient taking  differently: Place 2 sprays into the nose in the morning and at bedtime. )     levothyroxine (SYNTHROID, LEVOTHROID) 75 MCG tablet Take 75 mcg by mouth daily before breakfast.      metoprolol succinate (TOPROL XL) 25 MG 24 hr tablet Take 1 tablet (25 mg total) by mouth at bedtime. 30 tablet 6   polyethylene glycol powder (MIRALAX) powder Take 17 g by mouth as needed for mild constipation.      potassium chloride SA (K-DUR,KLOR-CON) 20 MEQ tablet Take 40 mEq by mouth daily.     Rivaroxaban (XARELTO) 15 MG TABS tablet Take 1 tablet (15 mg total) by mouth daily with supper. 30 tablet    sertraline (ZOLOFT) 25 MG tablet Take 25 mg by mouth at bedtime.      torsemide (DEMADEX) 20 MG tablet Take 20 mg by mouth daily.     No current facility-administered medications for this visit.    Allergies:   Codeine, Iron, Morphine, and Toviaz [fesoterodine fumarate]   Social History:  The patient  reports that she quit smoking about 37 years ago. Her smoking use included cigarettes. She smoked 0.50 packs per day. She has never used smokeless tobacco. She reports current alcohol use. She reports that she does not use drugs.   Family History:  The patient's   family history includes Heart disease in her brother and father; Parkinson's disease in her brother and sister.   ROS:  Please see the history of present illness.   All other systems are personally reviewed and negative.    Exam:    Vital Signs:  BP (!) 150/79   Pulse 60   Temp (!) 97.3 F (36.3 C)   Wt 207 lb (93.9 kg)   SpO2 93%   BMI 35.53 kg/m     Labs/Other Tests and Data Reviewed:    Recent Labs: 11/29/2019: TSH 4.09 12/09/2019: ALT 13; BUN 18; Creatinine, Ser 1.98; Hemoglobin 12.8; Platelets 195; Potassium 4.3; Sodium 142   Wt Readings from Last 3 Encounters:  03/26/20 207 lb (93.9 kg)  12/24/19 206 lb 6.4 oz (93.6 kg)  12/17/19 204 lb (92.5 kg)     Other studies personally reviewed:   Last device remote is reviewed from [facility]  PDF dated 5/21 which reveals normal device function,  Significant decrease in afib burden dating back to early 01-15-23   ASSESSMENT & PLAN:   Pacemaker-[employer]   Atrial fibrillation-persistent   HFpEF     COPD   Treated hypothyroidism   PVCs-frequent (4.7%)    Renal insufficiency grade 4    Chronic dyspnea with some  palpitations, but no changes; multifactorial with COPD-- gradually improving  Esp post COVID   On Anticoagulation;  No bleeding issues   Euvolemic continue current meds     Device function normal  Encouraged to do PT      COVID 19 screen The patient denies symptoms of COVID 19 at this time.  The importance of social distancing was discussed today.  Follow-up:  59m Next remote:    Current medicines are reviewed at length with the patient today.   The patient HAS  No concerns regarding her medicines.  The following changes were made today:   Labs/ tests ordered today include: liver profile No orders of the defined types were placed in this encounter.     Patient Risk:  after full review of this patients clinical status, I feel that they are at moderate risk at this time.  Today, I have spent  minutes with the patient with telehealth technology discussing the above.  Signed, Virl Axe, MD  03/26/2020 1:44 PM     [place] [address] [phone] (office) [phone] (fax)

## 2020-03-26 NOTE — Telephone Encounter (Signed)
  Patient Consent for Virtual Visit         Cindy Cervantes has provided verbal consent on 03/26/2020 for a virtual visit (video or telephone).   CONSENT FOR VIRTUAL VISIT FOR:  Cindy Cervantes  By participating in this virtual visit I agree to the following:  I hereby voluntarily request, consent and authorize [facility] and its employed or contracted physicians, physician assistants, nurse practitioners or other licensed health care professionals (the Practitioner), to provide me with telemedicine health care services (the "Services") as deemed necessary by the treating Practitioner. I acknowledge and consent to receive the Services by the Practitioner via telemedicine. I understand that the telemedicine visit will involve communicating with the Practitioner through live audiovisual communication technology and the disclosure of certain medical information by electronic transmission. I acknowledge that I have been given the opportunity to request an in-person assessment or other available alternative prior to the telemedicine visit and am voluntarily participating in the telemedicine visit.  I understand that I have the right to withhold or withdraw my consent to the use of telemedicine in the course of my care at any time, without affecting my right to future care or treatment, and that the Practitioner or I may terminate the telemedicine visit at any time. I understand that I have the right to inspect all information obtained and/or recorded in the course of the telemedicine visit and may receive copies of available information for a reasonable fee.  I understand that some of the potential risks of receiving the Services via telemedicine include:  Marland Kitchen Delay or interruption in medical evaluation due to technological equipment failure or disruption; . Information transmitted may not be sufficient (e.g. poor resolution of images) to allow for appropriate medical decision making by the Practitioner;  and/or  . In rare instances, security protocols could fail, causing a breach of personal health information.  Furthermore, I acknowledge that it is my responsibility to provide information about my medical history, conditions and care that is complete and accurate to the best of my ability. I acknowledge that Practitioner's advice, recommendations, and/or decision may be based on factors not within their control, such as incomplete or inaccurate data provided by me or distortions of diagnostic images or specimens that may result from electronic transmissions. I understand that the practice of medicine is not an exact science and that Practitioner makes no warranties or guarantees regarding treatment outcomes. I acknowledge that a copy of this consent can be made available to me via my patient portal ([facility]), or I can request a printed copy by calling the office of [facility].    I understand that my insurance will be billed for this visit.   I have read or had this consent read to me. . I understand the contents of this consent, which adequately explains the benefits and risks of the Services being provided via telemedicine.  . I have been provided ample opportunity to ask questions regarding this consent and the Services and have had my questions answered to my satisfaction. . I give my informed consent for the services to be provided through the use of telemedicine in my medical care

## 2020-03-27 NOTE — Patient Instructions (Signed)
Medication Instructions:  Your physician recommends that you continue on your current medications as directed. Please refer to the Current Medication list given to you today.  Labwork: None ordered.  Testing/Procedures: None ordered.  Follow-Up: Your physician wants you to follow-up in: 6 months with Dr Klein. You will receive a reminder letter in the mail two months in advance. If you don't receive a letter, please call our office to schedule the follow-up appointment.  Remote monitoring is used to monitor your Pacemaker of ICD from home. This monitoring reduces the number of office visits required to check your device to one time per year. It allows us to keep an eye on the functioning of your device to ensure it is working properly.Any Other Special Instructions Will Be Listed Below (If Applicable).  If you need a refill on your cardiac medications before your next appointment, please call your pharmacy.   

## 2020-04-08 ENCOUNTER — Non-Acute Institutional Stay: Payer: Medicare Other | Admitting: Internal Medicine

## 2020-04-08 ENCOUNTER — Other Ambulatory Visit: Payer: Self-pay

## 2020-04-08 ENCOUNTER — Encounter: Payer: Self-pay | Admitting: Internal Medicine

## 2020-04-08 VITALS — BP 128/72 | HR 60 | Temp 96.9°F | Ht 64.0 in | Wt 207.0 lb

## 2020-04-08 DIAGNOSIS — I4891 Unspecified atrial fibrillation: Secondary | ICD-10-CM | POA: Diagnosis not present

## 2020-04-08 DIAGNOSIS — I5032 Chronic diastolic (congestive) heart failure: Secondary | ICD-10-CM | POA: Diagnosis not present

## 2020-04-08 DIAGNOSIS — G250 Essential tremor: Secondary | ICD-10-CM

## 2020-04-08 DIAGNOSIS — D4620 Refractory anemia with excess of blasts, unspecified: Secondary | ICD-10-CM

## 2020-04-08 DIAGNOSIS — D46Z Other myelodysplastic syndromes: Secondary | ICD-10-CM

## 2020-04-08 DIAGNOSIS — K5904 Chronic idiopathic constipation: Secondary | ICD-10-CM | POA: Diagnosis not present

## 2020-04-08 DIAGNOSIS — D6869 Other thrombophilia: Secondary | ICD-10-CM | POA: Diagnosis not present

## 2020-04-08 DIAGNOSIS — G4733 Obstructive sleep apnea (adult) (pediatric): Secondary | ICD-10-CM

## 2020-04-08 DIAGNOSIS — I480 Paroxysmal atrial fibrillation: Secondary | ICD-10-CM | POA: Diagnosis not present

## 2020-04-08 NOTE — Progress Notes (Signed)
Location:  Occupational psychologist of Service:  Clinic (12)  Provider: Lynnsie Linders L. [name], D.O., C.M.D.  Code Status: DNR Goals of Care:  Advanced Directives 04/08/2020  Does Patient Have a Medical Advance Directive? Yes  Type of Paramedic of [place];Out of facility DNR (pink MOST or yellow form)  Does patient want to make changes to medical advance directive? No - Patient declined  Copy of [place] in Chart? Yes - validated most recent copy scanned in chart (See row information)  Pre-existing out of facility DNR order (yellow form or pink MOST form) Yellow form placed in chart (order not valid for inpatient use)     Chief Complaint  Patient presents with  . Medical Management of Chronic Issues    6 month follow-up     HPI: Patient is a 86 y.o. female seen today for medical management of chronic diseases.    Has been short of breath so not doing much.  She'd be panting going from here to the end of the hall, taking a shower, she has to sit down on the toilet right away.   Says she's out of breath b/c she cannot get enough exercise and cannot exercise b/c she's out of breath.  Sleeps in her chair a lot. Deconditioning certainly worsened amid covid.     She had a cardioversion back in March.   She says her memory is going.  Having trouble thinking of everyday words.  Says it's annoying, but everyone around her is the same.  May have lasted for 3 days.  Then 3/19, back in afib.  Had virtual visit last week and returns next month.  Started then on toprol.  Is on xarelto.  Not on amiodarone--stopped due to tremor concern, but did not help.    Tremor is mainly when trying to eat soup.  Now her feet shake when laying in bed.  Bounce around.  She has a tremor with the sewing pedal so not using sewing machine.    She is supposed to get together with family on the 17th at her son's.    Just went to the eye doctor last month or  month before.  All was fine, but since then, it's been blurry.  Had left lens cleaned after her cataract surgery before.  Needs to f/u with ophtho again.  Says she has weird dreams or maybe hallucinations of big purple globs that kind of look like the covid virus.  She will feel her husband but he's not there when she reaches out.  Not bothersome to her.    Says her sleep machine is still not working quite right.  She's going to call Dr. Halford Chessman about this.    Past Medical History:  Diagnosis Date  . Allergic rhinitis   . Anemia of renal disease 10/21/2011  . Anemia, iron deficiency 10/21/2011  . Cholelithiasis   . Chronic diastolic heart failure ([place])   . Colon polyp   . Constipation   . Cough    2nd to reflux  . Debility, unspecified   . Dehydration   . Depression   . Diaphragm dysfunction    Right hemidiaphragm  . Disorder of bone and cartilage, unspecified   . Diverticulosis of colon   . Dysphagia   . GERD (gastroesophageal reflux disease) 02/08/2013  . History of hyperkalemia in setting of spironolactone 09/2010  . Hyperlipidemia   . Hypertension   . Hypopotassemia   . Hypothyroidism  02/08/2013  . Long term (current) use of anticoagulants   . MDS (myelodysplastic syndrome), low grade ([place]) 10/21/2011  . Obesity   . Obesity hypoventilation syndrome ([place])   . OSA (obstructive sleep apnea)   . Osteoarthrosis, unspecified whether generalized or localized, unspecified site   . Other specified disease of white blood cells   . Pacemaker    Implanted December 2012  . Paroxysmal A-fib (HCC) with RVR   Amiodarone  . Reflux esophagitis   . Rosacea 05/15/2013  . Senile cataract   . Sinus node dysfunction (HCC)   . Tracheobronchomalacia   . Unspecified vitamin D deficiency   . Urinary incontinence   . Xerostomia     Past Surgical History:  Procedure Laterality Date  . CARDIOVERSION N/A 12/17/2019   Procedure: CARDIOVERSION;  Surgeon: Skeet Latch, MD;  Location: [facility];   Service: Cardiovascular;  Laterality: N/A;  . CATARACT EXTRACTION  2008  . PACEMAKER PLACEMENT  10/21/2010   STJ, dural chamber Dr. [name]  . TOTAL KNEE ARTHROPLASTY  1999   right knee  . TOTAL KNEE ARTHROPLASTY  2003   left knee  . TUBAL LIGATION  1964  . Tummy tuck  2000   pt "almost died"; respiratory distress, became delrious    Allergies  Allergen Reactions  . Codeine     Extreme lethargy  . Iron     By infusion - back spasms   . Morphine     Hallucinations, altered mental state  . Toviaz [Fesoterodine Fumarate]     hyperactive    Outpatient Encounter Medications as of 04/08/2020  Medication Sig  . acetaminophen (TYLENOL) 325 MG tablet Take 325-650 mg by mouth every 4 (four) hours as needed for moderate pain.   Marland Kitchen allopurinol (ZYLOPRIM) 100 MG tablet Take 100 mg by mouth daily.  Marland Kitchen dextromethorphan-guaiFENesin (MUCINEX DM) 30-600 MG 12hr tablet Take 1 tablet by mouth 2 (two) times daily as needed (cough / congestion).  . ezetimibe (ZETIA) 10 MG tablet Take 10 mg by mouth every evening.   . fluticasone (FLONASE) 50 MCG/ACT nasal spray Place 2 sprays into the nose 2 (two) times daily. 2 sprays in each nostril  . levothyroxine (SYNTHROID, LEVOTHROID) 75 MCG tablet Take 75 mcg by mouth daily before breakfast.   . metoprolol succinate (TOPROL XL) 25 MG 24 hr tablet Take 1 tablet (25 mg total) by mouth at bedtime.  . polyethylene glycol powder (MIRALAX) powder Take 17 g by mouth as needed for mild constipation.   . potassium chloride SA (K-DUR,KLOR-CON) 20 MEQ tablet Take 40 mEq by mouth daily.  . Rivaroxaban (XARELTO) 15 MG TABS tablet Take 1 tablet (15 mg total) by mouth daily with supper.  . sertraline (ZOLOFT) 25 MG tablet Take 25 mg by mouth at bedtime.   . torsemide (DEMADEX) 20 MG tablet Take 20 mg by mouth daily.   No facility-administered encounter medications on file as of 04/08/2020.    Review of Systems:  Review of Systems  Constitutional: Negative for chills and  fever.  HENT: Negative for congestion.        Hoarseness  Eyes: Positive for blurred vision.  Respiratory: Positive for shortness of breath. Negative for cough.   Cardiovascular: Negative for chest pain, palpitations and leg swelling.  Gastrointestinal: Positive for constipation. Negative for abdominal pain.  Genitourinary: Negative for dysuria.  Musculoskeletal: Negative for falls and joint pain.  Neurological: Negative for dizziness and loss of consciousness.  Endo/Heme/Allergies: Bruises/bleeds easily.  Psychiatric/Behavioral: Positive for hallucinations  and memory loss. Negative for depression. The patient is not nervous/anxious and does not have insomnia.     Health Maintenance  Topic Date Due  . INFLUENZA VACCINE  05/03/2020  . TETANUS/TDAP  07/07/2025  . DEXA SCAN  Completed  . COVID-19 Vaccine  Completed  . PNA vac Low Risk Adult  Completed    Physical Exam: Vitals:   04/08/20 1044  BP: 128/72  Pulse: 60  Temp: (!) 96.9 F (36.1 C)  TempSrc: Temporal  SpO2: 97%  Weight: 207 lb (93.9 kg)  Height: 5\' 4"  (1.626 m)   Body mass index is 35.53 kg/m. Physical Exam Vitals reviewed.  Constitutional:      Appearance: Normal appearance. She is obese.  HENT:     Head: Normocephalic and atraumatic.  Cardiovascular:     Rate and Rhythm: Rhythm irregular.     Heart sounds: No murmur heard.   Pulmonary:     Effort: Pulmonary effort is normal.     Breath sounds: Normal breath sounds. No wheezing, rhonchi or rales.  Abdominal:     General: Bowel sounds are normal.     Tenderness: There is no abdominal tenderness.  Musculoskeletal:        General: Normal range of motion.     Right lower leg: No edema.     Left lower leg: No edema.  Skin:    General: Skin is warm and dry.  Neurological:     General: No focal deficit present.     Mental Status: She is alert and oriented to person, place, and time.     Motor: Weakness present.     Gait: Gait abnormal.     Comments:  Used scooter to come down to appt from AL  Psychiatric:        Mood and Affect: Mood normal.        Behavior: Behavior normal.        Thought Content: Thought content normal.        Judgment: Judgment normal.     Labs reviewed: Basic Metabolic Panel: Recent Labs    05/20/19 1255 10/15/19 0000 10/15/19 0600 10/23/19 0000 10/23/19 0200 11/29/19 0300 12/09/19 1009  NA 146*   < >  --  138  --  145 142  K 4.3   < >  --  4.7  --  4.3 4.3  CL 102   < >  --  101  --  106 107  CO2 29   < >  --  29*  --  29* 25  GLUCOSE 90  --   --   --   --   --  122*  BUN 23   < >  --  24*  --  25* 18  CREATININE 1.76*   < >  --  1.7*  --  1.7* 1.98*  CALCIUM 9.4   < >  --   --  8.9 8.8 8.8*  TSH 2.630  --  3.58  --   --  4.09  --    < > = values in this interval not displayed.   Liver Function Tests: Recent Labs    10/15/19 0000 11/29/19 0300 12/09/19 1009  AST  --   --  16  ALT  --   --  13  ALKPHOS  --   --  82  BILITOT  --   --  0.7  PROT  --   --  6.7  ALBUMIN 3.9 3.7 3.6  No results for input(s): LIPASE, AMYLASE in the last 8760 hours. No results for input(s): AMMONIA in the last 8760 hours. CBC: Recent Labs    05/20/19 1255 12/09/19 1009  WBC 9.2 8.1  HGB 13.0 12.8  HCT 39.1 41.9  MCV 95 102.9*  PLT 217 195   Lipid Panel: No results for input(s): CHOL, HDL, LDLCALC, TRIG, CHOLHDL, LDLDIRECT in the last 8760 hours. Lab Results  Component Value Date   HGBA1C 5.5 09/06/2016    Assessment/Plan 1. Paroxysmal atrial fibrillation (HCC) -has had more trouble with dyspnea after cardioversion did not have lasting effect -had been on amiodarone but there were some concerns that it caused her tremor so it was reduced and eventually stopped; however, she still has tremor  2. Hypercoagulable state due to atrial fibrillation (HCC) -no problems related to her xarelto--cont as ordered and monitor  3. Chronic diastolic heart failure (HCC) -cont daily torsemide and  potassium -f/u labs before next visit -wt is up to 254.4 lbs but she has no edema or rales.  4. Benign essential tremor She has this and now some unusual hallucinations Oddly the hallucinations have been happening for some time while her tremor has been very gradually progressive She is ambulating a whole lot less ever since Covid quarantine time She has quit sewing with her sewing machine due to the tremor affecting her ability to do so safely. ? early Lewy body disease  5. OSA treated with BiPAP -Continue at bedtime BiPAP Follow-up with pulmonary regarding this due to ongoing challenges she is having as above  6. Chronic idiopathic constipation -Managing with MiraLAX as needed.  Encouraged hydration and mobility  7. MDS (myelodysplastic syndrome), low grade (HCC) -Reason for pancytopenia she has had.  9 continue to monitor CBC -May play some role in fatigue she has though I think this is mostly explained by her A. fib and sleep apnea where she is having challenges with her BiPAP machine.  Labs/tests ordered:  Cbc, cmp, flp, tsh Next appt:  6 mos, fasting labs before  Eraina Winnie L. [name] Cerney, D.O. [facility] [address], [place] [address] Cell Phone (Mon-Fri 8am-5pm):  [phone] On Call:  [phone] & follow prompts after 5pm & weekends Office Phone:  [phone] Office Fax:  [phone]

## 2020-05-18 ENCOUNTER — Ambulatory Visit (INDEPENDENT_AMBULATORY_CARE_PROVIDER_SITE_OTHER): Payer: Medicare Other | Admitting: *Deleted

## 2020-05-18 DIAGNOSIS — Z9189 Other specified personal risk factors, not elsewhere classified: Secondary | ICD-10-CM | POA: Diagnosis not present

## 2020-05-18 DIAGNOSIS — I4819 Other persistent atrial fibrillation: Secondary | ICD-10-CM | POA: Diagnosis not present

## 2020-05-18 DIAGNOSIS — Z20828 Contact with and (suspected) exposure to other viral communicable diseases: Secondary | ICD-10-CM | POA: Diagnosis not present

## 2020-05-18 LAB — CUP PACEART REMOTE DEVICE CHECK
Battery Remaining Longevity: 20 mo
Battery Remaining Percentage: 17 %
Battery Voltage: 2.75 V
Brady Statistic AP VP Percent: 1 %
Brady Statistic AP VS Percent: 93 %
Brady Statistic AS VP Percent: 1 %
Brady Statistic AS VS Percent: 5.7 %
Brady Statistic RA Percent Paced: 60 %
Brady Statistic RV Percent Paced: 5.7 %
Date Time Interrogation Session: 20210816020652
Implantable Lead Implant Date: 20120119
Implantable Lead Implant Date: 20120119
Implantable Lead Location: 753859
Implantable Lead Location: 753860
Implantable Lead Model: 1948
Implantable Pulse Generator Implant Date: 20120119
Lead Channel Impedance Value: 430 Ohm
Lead Channel Impedance Value: 730 Ohm
Lead Channel Pacing Threshold Amplitude: 0.5 V
Lead Channel Pacing Threshold Amplitude: 1 V
Lead Channel Pacing Threshold Pulse Width: 0.5 ms
Lead Channel Pacing Threshold Pulse Width: 0.5 ms
Lead Channel Sensing Intrinsic Amplitude: 0.8 mV
Lead Channel Sensing Intrinsic Amplitude: 9.1 mV
Lead Channel Setting Pacing Amplitude: 1.5 V
Lead Channel Setting Pacing Amplitude: 2.5 V
Lead Channel Setting Pacing Pulse Width: 0.5 ms
Lead Channel Setting Sensing Sensitivity: 2 mV
Pulse Gen Model: 2210
Pulse Gen Serial Number: 7198677

## 2020-05-19 NOTE — Progress Notes (Signed)
Remote pacemaker transmission.   

## 2020-05-25 DIAGNOSIS — Z20828 Contact with and (suspected) exposure to other viral communicable diseases: Secondary | ICD-10-CM | POA: Diagnosis not present

## 2020-05-25 DIAGNOSIS — Z9189 Other specified personal risk factors, not elsewhere classified: Secondary | ICD-10-CM | POA: Diagnosis not present

## 2020-07-06 ENCOUNTER — Ambulatory Visit (INDEPENDENT_AMBULATORY_CARE_PROVIDER_SITE_OTHER): Payer: Medicare Other | Admitting: Pulmonary Disease

## 2020-07-06 ENCOUNTER — Other Ambulatory Visit: Payer: Self-pay

## 2020-07-06 ENCOUNTER — Encounter: Payer: Self-pay | Admitting: Pulmonary Disease

## 2020-07-06 VITALS — BP 120/80 | HR 109 | Temp 97.3°F | Ht 64.0 in | Wt 210.0 lb

## 2020-07-06 DIAGNOSIS — E662 Morbid (severe) obesity with alveolar hypoventilation: Secondary | ICD-10-CM

## 2020-07-06 DIAGNOSIS — G4733 Obstructive sleep apnea (adult) (pediatric): Secondary | ICD-10-CM

## 2020-07-06 DIAGNOSIS — J9809 Other diseases of bronchus, not elsewhere classified: Secondary | ICD-10-CM

## 2020-07-06 DIAGNOSIS — J9611 Chronic respiratory failure with hypoxia: Secondary | ICD-10-CM

## 2020-07-06 NOTE — Patient Instructions (Signed)
Will have our patient care coordinator try to arrange for inogen one G4 portable oxygen concentrator at 2 liters flow rate  Follow up in 1 year

## 2020-07-06 NOTE — Progress Notes (Signed)
[facility]  Chief Complaint  Patient presents with  . Follow-up    OSA on BiPAP    Constitutional:  BP 120/80 (BP Location: Right Arm, Cuff Size: Normal)   Pulse (!) 109   Temp (!) 97.3 F (36.3 C) (Oral)   Ht 5\' 4"  (1.626 m)   Wt 210 lb (95.3 kg)   SpO2 (!) 88%   BMI 36.05 kg/m   Past Medical History:  Xerostomia, Vit D deficiency, Urine incontinence, Sinus nod dysfx s/p PM, Rosacea, PAF, OA, MDS, Hypothyroidism, HTN, HLD, GERD, Diverticulosis, Depression, Colon polyp, Diastolic CHF, Myelodysplastic syndrome  Past Surgical History:  Her  has a past surgical history that includes Tubal ligation (5465); Total knee arthroplasty (1999); Tummy tuck (2000); Total knee arthroplasty (2003); Cataract extraction (2008); pacemaker placement (10/21/2010); and Cardioversion (N/A, 12/17/2019).  Brief Summary:  Cindy Cervantes is a 86 y.o. female former smoker with chronic hypoxic respiratory failure 2nd to OSA, OHS, tracheobronchomalacia, and Rt hemidiaphragm elevation.      Subjective:   She resides at [facility].  She has been getting more short of breath with activity.  As a result she isn't as active.  Her SpO2 on walking today was 87% on room air.  Recovered to > 90% with 2 liters while walking.    She had an inogen device that she used during the day, but this is not longer working.  Currently she only has oxygen set up at night that she uses with her Bipap.  No issues with Bipap mask or pressure setting.  Physical Exam:   Appearance - well kempt   ENMT - no sinus tenderness, no oral exudate, no LAN, Mallampati 3 airway, no stridor  Respiratory - equal breath sounds bilaterally, no wheezing or rales  CV - s1s2 regular rate and rhythm, no murmurs  Ext - no clubbing, no edema  Skin - no rashes  Psych - normal mood and affect   Pulmonary testing:   PFT's March 29 2010 >> FEV1 1.34 (72) with ratio 75 and ERV  42, DLC0 55 > corrects to 137  Chest Imaging:   CT chest 10/17/10 >> GGO LUL, no PE, tracheobronchomalacia involving the visualized trachea with areas of air trapping seen throughout both lungs  Sleep Tests:   PSG 12/14/10 >> AHI 17.9, REM 40.8  BPAP titraton 01/12/11 >> 18/13 cm H2O with 3 liters oxygen.  ONO on BiPAP 03/16/15 >>Test time 8 hrs 2 min. Mean SpO2 90.3%, low SpO2 70%. Spent 33 min with SpO2 <88%.  ONO with Bipap 02/13/17 >>test time 7 hrs 5 min. Average SpO2 90%, low SpO2 80%. Spent 1 hr 41 min with SpO2 <88%  Bipap 08/05/19 to 09/03/19 >> used on 30 of 30 nights with average 7 hrs 42 min.  Average AHI 0.4 with Bipap 12/8 cm H2O.  Cardiac Tests:   Echo 12/06/17 >> EF 65 to 70%, grade 1 DD  Social History:  She  reports that she quit smoking about 37 years ago. Her smoking use included cigarettes. She smoked 0.50 packs per day. She has never used smokeless tobacco. She reports current alcohol use. She reports that she does not use drugs.  Family History:  Her family history includes Heart disease in her brother and father; Parkinson's disease in her brother and sister.     Assessment/Plan:   Chronic hypoxic respiratory failure secondary to obstructive sleep apnea, obesity hypoventilation syndrome, tracheobronchomalacia, and Rt hemidiaphragm paralysis. -  she is compliant with therapy and reports benefit from [facility] for her DME - continue Bipap 12/8 cm H2O with 2 liters oxygen at night - she would like to purchase an inogen device to use with activity during the day; will see if [facility] can arrange for inogen one G4 POC at 2 liters with exertion  Persistent atrial fibrillation, chronic diastolic CHF. - followed by Dr. [name] with [facility] of care. - DNR/DNI  Time Spent Involved in Patient Care on Day of Examination:  32 minutes  Follow up:  Patient Instructions  Will have our patient care coordinator try to arrange for inogen one  G4 portable oxygen concentrator at 2 liters flow rate  Follow up in 1 year   Medication List:   Allergies as of 07/06/2020      Reactions   Codeine    Extreme lethargy   Iron    By infusion - back spasms    Morphine    Hallucinations, altered mental state   Toviaz [fesoterodine Fumarate]    hyperactive      Medication List       Accurate as of July 06, 2020 12:48 PM. If you have any questions, ask your nurse or doctor.        allopurinol 100 MG tablet Commonly known as: ZYLOPRIM Take 100 mg by mouth daily.   dextromethorphan-guaiFENesin 30-600 MG 12hr tablet Commonly known as: MUCINEX DM Take 1 tablet by mouth 2 (two) times daily as needed (cough / congestion).   ezetimibe 10 MG tablet Commonly known as: ZETIA Take 10 mg by mouth every evening.   fluticasone 50 MCG/ACT nasal spray Commonly known as: FLONASE Place 2 sprays into the nose 2 (two) times daily. 2 sprays in each nostril   levothyroxine 75 MCG tablet Commonly known as: SYNTHROID Take 75 mcg by mouth daily before breakfast.   metoprolol succinate 25 MG 24 hr tablet Commonly known as: Toprol XL Take 1 tablet (25 mg total) by mouth at bedtime.   MiraLax 17 GM/SCOOP powder Generic drug: polyethylene glycol powder Take 17 g by mouth as needed for mild constipation.   potassium chloride SA 20 MEQ tablet Commonly known as: KLOR-CON Take 40 mEq by mouth daily.   Rivaroxaban 15 MG Tabs tablet Commonly known as: Xarelto Take 1 tablet (15 mg total) by mouth daily with supper.   sertraline 25 MG tablet Commonly known as: ZOLOFT Take 25 mg by mouth at bedtime.   torsemide 20 MG tablet Commonly known as: DEMADEX Take 20 mg by mouth daily.   Tylenol 325 MG tablet Generic drug: acetaminophen Take 325-650 mg by mouth every 4 (four) hours as needed for moderate pain.       Signature:  Chesley Mires, MD [place] Pager - [phone] 07/06/2020, 12:48 PM

## 2020-07-07 ENCOUNTER — Telehealth: Payer: Self-pay | Admitting: Pulmonary Disease

## 2020-07-07 NOTE — Telephone Encounter (Signed)
We just placed order for pt to receive POC from Inogen 07/06/20  Spoke with Chantea and notified that this was done She will inform the pt  Nothing further needed

## 2020-07-22 DIAGNOSIS — L821 Other seborrheic keratosis: Secondary | ICD-10-CM | POA: Diagnosis not present

## 2020-07-22 DIAGNOSIS — L820 Inflamed seborrheic keratosis: Secondary | ICD-10-CM | POA: Diagnosis not present

## 2020-07-22 DIAGNOSIS — Z85828 Personal history of other malignant neoplasm of skin: Secondary | ICD-10-CM | POA: Diagnosis not present

## 2020-07-22 DIAGNOSIS — L570 Actinic keratosis: Secondary | ICD-10-CM | POA: Diagnosis not present

## 2020-07-22 DIAGNOSIS — D1801 Hemangioma of skin and subcutaneous tissue: Secondary | ICD-10-CM | POA: Diagnosis not present

## 2020-08-13 DIAGNOSIS — Z23 Encounter for immunization: Secondary | ICD-10-CM | POA: Diagnosis not present

## 2020-08-17 ENCOUNTER — Ambulatory Visit (INDEPENDENT_AMBULATORY_CARE_PROVIDER_SITE_OTHER): Payer: Medicare Other

## 2020-08-17 DIAGNOSIS — I495 Sick sinus syndrome: Secondary | ICD-10-CM

## 2020-08-17 LAB — CUP PACEART REMOTE DEVICE CHECK
Battery Remaining Longevity: 18 mo
Battery Remaining Percentage: 15 %
Battery Voltage: 2.74 V
Brady Statistic AP VP Percent: 1 %
Brady Statistic AP VS Percent: 91 %
Brady Statistic AS VP Percent: 1 %
Brady Statistic AS VS Percent: 8 %
Brady Statistic RA Percent Paced: 66 %
Brady Statistic RV Percent Paced: 4.5 %
Date Time Interrogation Session: 20211115033944
Implantable Lead Implant Date: 20120119
Implantable Lead Implant Date: 20120119
Implantable Lead Location: 753859
Implantable Lead Location: 753860
Implantable Lead Model: 1948
Implantable Pulse Generator Implant Date: 20120119
Lead Channel Impedance Value: 410 Ohm
Lead Channel Impedance Value: 800 Ohm
Lead Channel Pacing Threshold Amplitude: 0.5 V
Lead Channel Pacing Threshold Amplitude: 1 V
Lead Channel Pacing Threshold Pulse Width: 0.5 ms
Lead Channel Pacing Threshold Pulse Width: 0.5 ms
Lead Channel Sensing Intrinsic Amplitude: 1.8 mV
Lead Channel Sensing Intrinsic Amplitude: 9.7 mV
Lead Channel Setting Pacing Amplitude: 1.5 V
Lead Channel Setting Pacing Amplitude: 2.5 V
Lead Channel Setting Pacing Pulse Width: 0.5 ms
Lead Channel Setting Sensing Sensitivity: 2 mV
Pulse Gen Model: 2210
Pulse Gen Serial Number: 7198677

## 2020-08-18 ENCOUNTER — Telehealth: Payer: Self-pay

## 2020-08-18 NOTE — Progress Notes (Signed)
Remote pacemaker transmission.   

## 2020-08-18 NOTE — Telephone Encounter (Signed)
[facility] alert received 11/16/21for 123 AMS events, appear competitive atrial pacing dur to sensor induced response. Consulted with [place] from [facility], recommends to change AMS mode to VVI, and rate response slope to 8.  Patient called to request to make device clinic apt. To make changes to device. Agreeable to  plan. Reports she took Covid booster shot 08/13/20 and has not felt well, but has since improved over the past few days.  Location, date and time informed to patient when to come to apt. Advised patient to call with any questions or concerns. Verbalized understanding.

## 2020-08-20 ENCOUNTER — Ambulatory Visit (INDEPENDENT_AMBULATORY_CARE_PROVIDER_SITE_OTHER): Payer: Medicare Other | Admitting: Emergency Medicine

## 2020-08-20 ENCOUNTER — Other Ambulatory Visit: Payer: Self-pay

## 2020-08-20 DIAGNOSIS — I495 Sick sinus syndrome: Secondary | ICD-10-CM

## 2020-08-20 LAB — CUP PACEART INCLINIC DEVICE CHECK
Date Time Interrogation Session: 20211118110104
Implantable Lead Implant Date: 20120119
Implantable Lead Implant Date: 20120119
Implantable Lead Location: 753859
Implantable Lead Location: 753860
Implantable Lead Model: 1948
Implantable Pulse Generator Implant Date: 20120119
Pulse Gen Model: 2210
Pulse Gen Serial Number: 7198677

## 2020-08-20 NOTE — Progress Notes (Signed)
Patient seen in device clinic for programming changes.  Changes to session:  AMS Mode --> VVI Slope --> 8

## 2020-08-20 NOTE — Patient Instructions (Signed)
Please call if you are not feeling any improvement.   [facility] [phone]

## 2020-09-16 DIAGNOSIS — Z85828 Personal history of other malignant neoplasm of skin: Secondary | ICD-10-CM | POA: Diagnosis not present

## 2020-09-16 DIAGNOSIS — L821 Other seborrheic keratosis: Secondary | ICD-10-CM | POA: Diagnosis not present

## 2020-09-16 DIAGNOSIS — D1801 Hemangioma of skin and subcutaneous tissue: Secondary | ICD-10-CM | POA: Diagnosis not present

## 2020-09-16 DIAGNOSIS — L570 Actinic keratosis: Secondary | ICD-10-CM | POA: Diagnosis not present

## 2020-09-22 DIAGNOSIS — I5030 Unspecified diastolic (congestive) heart failure: Secondary | ICD-10-CM | POA: Insufficient documentation

## 2020-09-22 DIAGNOSIS — R002 Palpitations: Secondary | ICD-10-CM | POA: Insufficient documentation

## 2020-09-23 ENCOUNTER — Ambulatory Visit (INDEPENDENT_AMBULATORY_CARE_PROVIDER_SITE_OTHER): Payer: Medicare Other | Admitting: Internal Medicine

## 2020-09-23 ENCOUNTER — Encounter: Payer: Self-pay | Admitting: Internal Medicine

## 2020-09-23 ENCOUNTER — Other Ambulatory Visit: Payer: Self-pay

## 2020-09-23 VITALS — BP 128/72 | HR 84 | Ht 64.0 in | Wt 210.0 lb

## 2020-09-23 DIAGNOSIS — Z79899 Other long term (current) drug therapy: Secondary | ICD-10-CM | POA: Diagnosis not present

## 2020-09-23 DIAGNOSIS — Z950 Presence of cardiac pacemaker: Secondary | ICD-10-CM

## 2020-09-23 DIAGNOSIS — I5032 Chronic diastolic (congestive) heart failure: Secondary | ICD-10-CM | POA: Diagnosis not present

## 2020-09-23 DIAGNOSIS — R0602 Shortness of breath: Secondary | ICD-10-CM | POA: Diagnosis not present

## 2020-09-23 DIAGNOSIS — I4819 Other persistent atrial fibrillation: Secondary | ICD-10-CM | POA: Diagnosis not present

## 2020-09-23 DIAGNOSIS — R002 Palpitations: Secondary | ICD-10-CM

## 2020-09-23 LAB — CUP PACEART INCLINIC DEVICE CHECK
Battery Remaining Longevity: 14 mo
Battery Voltage: 2.71 V
Brady Statistic RA Percent Paced: 77 %
Brady Statistic RV Percent Paced: 0.81 %
Date Time Interrogation Session: 20211222151800
Implantable Lead Implant Date: 20120119
Implantable Lead Implant Date: 20120119
Implantable Lead Location: 753859
Implantable Lead Location: 753860
Implantable Lead Model: 1948
Implantable Pulse Generator Implant Date: 20120119
Lead Channel Impedance Value: 450 Ohm
Lead Channel Impedance Value: 787.5 Ohm
Lead Channel Pacing Threshold Amplitude: 0.5 V
Lead Channel Pacing Threshold Amplitude: 1 V
Lead Channel Pacing Threshold Pulse Width: 0.5 ms
Lead Channel Pacing Threshold Pulse Width: 0.5 ms
Lead Channel Sensing Intrinsic Amplitude: 11.1 mV
Lead Channel Sensing Intrinsic Amplitude: 2.4 mV
Lead Channel Setting Pacing Amplitude: 1.5 V
Lead Channel Setting Pacing Amplitude: 2.5 V
Lead Channel Setting Pacing Pulse Width: 0.5 ms
Lead Channel Setting Sensing Sensitivity: 2 mV
Pulse Gen Model: 2210
Pulse Gen Serial Number: 7198677

## 2020-09-23 NOTE — Patient Instructions (Addendum)
Medication Instructions:   Your physician has recommended you make the following change in your medication:   ** Increase your Demadex to 40mg  daily x 5 days  *If you need a refill on your cardiac medications before your next appointment, please call your pharmacy*   Lab Work: BMET today  If you have labs (blood work) drawn today and your tests are completely normal, you will receive your results only by: Marland Kitchen MyChart Message (if you have MyChart) OR . A paper copy in the mail If you have any lab test that is abnormal or we need to change your treatment, we will call you to review the results.   Testing/Procedures:  Chest Xray at [facility] - order provided   Follow-Up: At [facility], you and your health needs are our priority.  As part of our continuing mission to provide you with exceptional heart care, we have created designated Provider Care Teams.  These Care Teams include your primary Cardiologist (physician) and Advanced Practice Providers (APPs -  Physician Assistants and Nurse Practitioners) who all work together to provide you with the care you need, when you need it.  We recommend signing up for the patient portal called "MyChart".  Sign up information is provided on this After Visit Summary.  MyChart is used to connect with patients for Virtual Visits (Telemedicine).  Patients are able to view lab/test results, encounter notes, upcoming appointments, etc.  Non-urgent messages can be sent to your provider as well.   To learn more about what you can do with MyChart, go to [email or web address].    Your next appointment:   6 month(s)  The format for your next appointment:   In Person  Provider:   Virl Axe, MD

## 2020-09-23 NOTE — Progress Notes (Signed)
Maybe as long as calling [place]    Cardiology Office Note Date:  09/23/2020  Patient ID:  Cindy Cervantes, Cindy Cervantes Aug 14, 1932, MRN [id number] PCP:  Gayland Curry, DO  Electrophysiology: Dr. [name] [facility] Chesley Mires, MD as Consulting Physician (Pulmonary Disease) Volanda Napoleon, MD as Consulting Physician (Oncology) Trixie Rude., MD as Consulting Physician (Ophthalmology)   Chief Complaint:  Routine EP, pacer visit  History of Present Illness: Cindy Cervantes is a 86 y.o. female  Seen in followup for her paroxysmal atrial fibrillation with a rapid ventricular response and sinus node dysfunction for which she required pacemaker implantation in the fall of 2011.   Complaining of worsening shortness of breath with exertion.  Actually bought her home oxygen system.  Some peripheral edema.  Chronic but worsening orthopnea not withstanding the fact that she uses a CPAP.  Concerned about whether she is depressed.   Date Cr K TSH LFTs Hgb  6/17    3.25 6    12/18 1.6 3.8 3.24 4 10.9  6/19 1.8 4.4 3.65    8/20 1.76  2.63    3/21 1.98 4.3 4.09 13 12.8           Past Medical History:  Diagnosis Date  . Allergic rhinitis   . Anemia of renal disease 10/21/2011  . Anemia, iron deficiency 10/21/2011  . Cholelithiasis   . Chronic diastolic heart failure ([place])   . Colon polyp   . Constipation   . Cough    2nd to reflux  . Debility, unspecified   . Dehydration   . Depression   . Diaphragm dysfunction    Right hemidiaphragm  . Disorder of bone and cartilage, unspecified   . Diverticulosis of colon   . Dysphagia   . GERD (gastroesophageal reflux disease) 02/08/2013  . History of hyperkalemia in setting of spironolactone 09/2010  . Hyperlipidemia   . Hypertension   . Hypopotassemia   . Hypothyroidism 02/08/2013  . Long term (current) use of anticoagulants   . MDS (myelodysplastic syndrome), low grade ([place]) 10/21/2011  . Obesity   . Obesity  hypoventilation syndrome ([place])   . OSA (obstructive sleep apnea)   . Osteoarthrosis, unspecified whether generalized or localized, unspecified site   . Other specified disease of white blood cells   . Pacemaker    Implanted December 2012  . Paroxysmal A-fib (HCC) with RVR   Amiodarone  . Reflux esophagitis   . Rosacea 05/15/2013  . Senile cataract   . Sinus node dysfunction (HCC)   . Tracheobronchomalacia   . Unspecified vitamin D deficiency   . Urinary incontinence   . Xerostomia     Past Surgical History:  Procedure Laterality Date  . CARDIOVERSION N/A 12/17/2019   Procedure: CARDIOVERSION;  Surgeon: Skeet Latch, MD;  Location: [facility];  Service: Cardiovascular;  Laterality: N/A;  . CATARACT EXTRACTION  2008  . PACEMAKER PLACEMENT  10/21/2010   STJ, dural chamber Dr. [name]  . TOTAL KNEE ARTHROPLASTY  1999   right knee  . TOTAL KNEE ARTHROPLASTY  2003   left knee  . TUBAL LIGATION  1964  . Tummy tuck  2000   pt "almost died"; respiratory distress, became delrious    Current Outpatient Medications  Medication Sig Dispense Refill  . acetaminophen (TYLENOL) 325 MG tablet Take 325-650 mg by mouth every 4 (four) hours as needed for moderate pain.     Marland Kitchen allopurinol (ZYLOPRIM) 100 MG tablet Take 100 mg  by mouth daily.    Marland Kitchen dextromethorphan-guaiFENesin (MUCINEX DM) 30-600 MG 12hr tablet Take 1 tablet by mouth 2 (two) times daily as needed (cough / congestion).    . ezetimibe (ZETIA) 10 MG tablet Take 10 mg by mouth every evening.     . fluticasone (FLONASE) 50 MCG/ACT nasal spray Place 2 sprays into the nose 2 (two) times daily. 2 sprays in each nostril    . levothyroxine (SYNTHROID, LEVOTHROID) 75 MCG tablet Take 75 mcg by mouth daily before breakfast.     . metoprolol succinate (TOPROL XL) 25 MG 24 hr tablet Take 1 tablet (25 mg total) by mouth at bedtime. 30 tablet 6  . polyethylene glycol powder (MIRALAX) powder Take 17 g by mouth as needed for mild constipation.      . potassium chloride SA (K-DUR,KLOR-CON) 20 MEQ tablet Take 40 mEq by mouth daily.    . Rivaroxaban (XARELTO) 15 MG TABS tablet Take 1 tablet (15 mg total) by mouth daily with supper. 30 tablet   . sertraline (ZOLOFT) 25 MG tablet Take 25 mg by mouth at bedtime.     . torsemide (DEMADEX) 20 MG tablet Take 20 mg by mouth daily.     No current facility-administered medications for this visit.    Allergies:   Codeine, Iron, Morphine, and Toviaz [fesoterodine fumarate]     ROS:  Please see the history of present illness.  All other systems are reviewed and otherwise negative.     BP 128/72   Pulse 84   Ht 5\' 4"  (1.626 m)   Wt 210 lb (95.3 kg)   SpO2 95%   BMI 36.05 kg/m  Well developed and well nourished in no acute distress HENT normal Neck supple with JVP 8-10 cm Marked decreased breath sounds right chest Device pocket well healed; without hematoma or erythema.  There is no tethering  Regular rate and rhythm, no  murmur Abd-soft with active BS No Clubbing cyanosis 1+ edema Skin-warm and dry A & Oriented  Grossly normal sensory and motor function  ECG atrial paced at 86 intervals 16/10/46 Frequent PACs      ASSESSMENT AND PLAN:  Pacemaker-[employer]    Atrial fibrillation-persistent  HFpEF acute/chronic  Decreased breath sounds right lung  PVCs-frequent (4.7%)   Tremor  Renal insufficiency grade 3  No significant interval atrial fibrillation  Fibrillation 77% atrial paced with frequent PACs.  Reasonable heart rate excursion.  Scant ventricular pacing.  No bleeding.  Shortness of breath is worsening.  There is evidence of volume overload.  We will increase her diuretics from 20--40 Demadex daily x5 days.  Also has notable loss of breath sounds in the right lung suggestive of a pleural effusion.  We will get a chest x-ray.  May need further evaluation.  She is also concerned as to whether she is depressed.  Has lost a lot of vigor and enthusiasm.  I reached  out to her PCP.  Therefore to meet in about 10 days time.  With her worsening renal function in the spring, we will recheck a metabolic profile today.  Virl Axe  09/23/2020 2:24 PM     [place] [address] [phone] (office)  [phone] (fax)

## 2020-09-24 DIAGNOSIS — R079 Chest pain, unspecified: Secondary | ICD-10-CM | POA: Diagnosis not present

## 2020-09-24 LAB — BASIC METABOLIC PANEL
BUN/Creatinine Ratio: 12 (ref 12–28)
BUN: 22 mg/dL (ref 8–27)
CO2: 31 mmol/L — ABNORMAL HIGH (ref 20–29)
Calcium: 9.4 mg/dL (ref 8.7–10.3)
Chloride: 103 mmol/L (ref 96–106)
Creatinine, Ser: 1.77 mg/dL — ABNORMAL HIGH (ref 0.57–1.00)
GFR calc Af Amer: 30 mL/min/{1.73_m2} — ABNORMAL LOW (ref >59)
GFR calc non Af Amer: 26 mL/min/{1.73_m2} — ABNORMAL LOW (ref >59)
Glucose: 99 mg/dL (ref 65–99)
Potassium: 4.7 mmol/L (ref 3.5–5.2)
Sodium: 147 mmol/L — ABNORMAL HIGH (ref 134–144)

## 2020-09-30 ENCOUNTER — Other Ambulatory Visit: Payer: Self-pay

## 2020-09-30 ENCOUNTER — Encounter: Payer: Self-pay | Admitting: Internal Medicine

## 2020-09-30 ENCOUNTER — Non-Acute Institutional Stay: Payer: Medicare Other | Admitting: Internal Medicine

## 2020-09-30 VITALS — BP 122/72 | Temp 97.7°F | Ht 64.0 in | Wt 207.0 lb

## 2020-09-30 DIAGNOSIS — I5032 Chronic diastolic (congestive) heart failure: Secondary | ICD-10-CM

## 2020-09-30 DIAGNOSIS — F321 Major depressive disorder, single episode, moderate: Secondary | ICD-10-CM | POA: Diagnosis not present

## 2020-09-30 DIAGNOSIS — I480 Paroxysmal atrial fibrillation: Secondary | ICD-10-CM | POA: Diagnosis not present

## 2020-09-30 DIAGNOSIS — D6869 Other thrombophilia: Secondary | ICD-10-CM | POA: Diagnosis not present

## 2020-09-30 DIAGNOSIS — Z66 Do not resuscitate: Secondary | ICD-10-CM | POA: Diagnosis not present

## 2020-09-30 DIAGNOSIS — D4620 Refractory anemia with excess of blasts, unspecified: Secondary | ICD-10-CM

## 2020-09-30 DIAGNOSIS — I4891 Unspecified atrial fibrillation: Secondary | ICD-10-CM | POA: Diagnosis not present

## 2020-09-30 DIAGNOSIS — D46Z Other myelodysplastic syndromes: Secondary | ICD-10-CM

## 2020-09-30 DIAGNOSIS — G4733 Obstructive sleep apnea (adult) (pediatric): Secondary | ICD-10-CM | POA: Diagnosis not present

## 2020-09-30 MED ORDER — SERTRALINE HCL 50 MG PO TABS: 50.0000 mg | ORAL_TABLET | Freq: Every day | ORAL | 3 refills | Status: AC

## 2020-09-30 NOTE — Progress Notes (Signed)
Location:  Occupational psychologist of Service:  Clinic (12)  Provider: Zionah Criswell L. [name], D.O., C.M.D.  Code Status: DNR Goals of Care:  Advanced Directives 09/30/2020  Does Patient Have a Medical Advance Directive? Yes  Type of Paramedic of [place];Living will  Does patient want to make changes to medical advance directive? No - Patient declined  Copy of [place] in Chart? Yes - validated most recent copy scanned in chart (See row information)  Pre-existing out of facility DNR order (yellow form or pink MOST form) -     Chief Complaint  Patient presents with  . Acute Visit    Feeling depressed lately, she stated she does not feel well and is down because she can not do the things she used to do. Feeling old as she stated. She went to Dr. Cleda Mccreedy last week and would like to discuss results.   . Medical Management of Chronic Issues    Med mgt (addressed this week instead of planned next)    HPI: Patient is a 86 y.o. female seen today for medical management of chronic diseases.    She'd been more sob and having malaise when she saw cardiology.  He noted she had diminished breath sounds on the right and JVD.  She was given extra torsemide for 5 days.  CXR was done and that was negative for edema or pneumonia.    Most of the time, she feels better in the mornings than she did at her cardiology appt.  She's not been out of breath since then.  Sometimes just walking her room to the dining room, she has to catch her breath.  She is down 3 lbs since week.   Says being old, she can't go to the grocery store due to dyspnea.  Doesn't shop.  Quit quilting.  Does not even feel like doing it.  Sits in her chair, plays on her ipad and reads.  Loss of motivation.  She was also very depressed at her cardiology appt.  She's on a tiny bit of zoloft.    Past Medical History:  Diagnosis Date  . Allergic rhinitis   . Anemia of renal disease  10/21/2011  . Anemia, iron deficiency 10/21/2011  . Cholelithiasis   . Chronic diastolic heart failure ([place])   . Colon polyp   . Constipation   . Cough    2nd to reflux  . Debility, unspecified   . Dehydration   . Depression   . Diaphragm dysfunction    Right hemidiaphragm  . Disorder of bone and cartilage, unspecified   . Diverticulosis of colon   . Dysphagia   . GERD (gastroesophageal reflux disease) 02/08/2013  . History of hyperkalemia in setting of spironolactone 09/2010  . Hyperlipidemia   . Hypertension   . Hypopotassemia   . Hypothyroidism 02/08/2013  . Long term (current) use of anticoagulants   . MDS (myelodysplastic syndrome), low grade ([place]) 10/21/2011  . Obesity   . Obesity hypoventilation syndrome ([place])   . OSA (obstructive sleep apnea)   . Osteoarthrosis, unspecified whether generalized or localized, unspecified site   . Other specified disease of white blood cells   . Pacemaker    Implanted December 2012  . Paroxysmal A-fib (HCC) with RVR   Amiodarone  . Reflux esophagitis   . Rosacea 05/15/2013  . Senile cataract   . Sinus node dysfunction (HCC)   . Tracheobronchomalacia   . Unspecified vitamin D deficiency   .  Urinary incontinence   . Xerostomia     Past Surgical History:  Procedure Laterality Date  . CARDIOVERSION N/A 12/17/2019   Procedure: CARDIOVERSION;  Surgeon: Chilton Si, MD;  Location: [facility] ENDOSCOPY;  Service: Cardiovascular;  Laterality: N/A;  . CATARACT EXTRACTION  2008  . PACEMAKER PLACEMENT  10/21/2010   STJ, dural chamber Dr. Graciela Husbands  . TOTAL KNEE ARTHROPLASTY  1999   right knee  . TOTAL KNEE ARTHROPLASTY  2003   left knee  . TUBAL LIGATION  1964  . Tummy tuck  2000   pt "almost died"; respiratory distress, became delrious    Allergies  Allergen Reactions  . Codeine     Extreme lethargy  . Iron     By infusion - back spasms   . Morphine     Hallucinations, altered mental state  . Toviaz [Fesoterodine Fumarate]      hyperactive    Outpatient Encounter Medications as of 09/30/2020  Medication Sig  . acetaminophen (TYLENOL) 325 MG tablet Take 325-650 mg by mouth every 4 (four) hours as needed for moderate pain.  Marland Kitchen allopurinol (ZYLOPRIM) 100 MG tablet Take 100 mg by mouth daily.  Marland Kitchen dextromethorphan-guaiFENesin (MUCINEX DM) 30-600 MG 12hr tablet Take 1 tablet by mouth 2 (two) times daily as needed (cough / congestion).  . ezetimibe (ZETIA) 10 MG tablet Take 10 mg by mouth every evening.   . fluticasone (FLONASE) 50 MCG/ACT nasal spray Place 2 sprays into the nose 2 (two) times daily. 2 sprays in each nostril  . levothyroxine (SYNTHROID, LEVOTHROID) 75 MCG tablet Take 75 mcg by mouth daily before breakfast.   . metoprolol succinate (TOPROL XL) 25 MG 24 hr tablet Take 1 tablet (25 mg total) by mouth at bedtime.  . polyethylene glycol powder (GLYCOLAX/MIRALAX) 17 GM/SCOOP powder Take 17 g by mouth as needed for mild constipation.   . potassium chloride SA (K-DUR,KLOR-CON) 20 MEQ tablet Take 40 mEq by mouth daily.  . Rivaroxaban (XARELTO) 15 MG TABS tablet Take 1 tablet (15 mg total) by mouth daily with supper.  . sertraline (ZOLOFT) 50 MG tablet Take 1 tablet (50 mg total) by mouth daily.  Marland Kitchen torsemide (DEMADEX) 20 MG tablet Take 20 mg by mouth daily.  . [DISCONTINUED] sertraline (ZOLOFT) 25 MG tablet Take 25 mg by mouth at bedtime.    No facility-administered encounter medications on file as of 09/30/2020.    Review of Systems:  Review of Systems  Constitutional: Negative for chills and fever.  HENT: Positive for congestion. Negative for sore throat.   Eyes: Negative for blurred vision.  Respiratory: Positive for shortness of breath. Negative for cough and wheezing.        Sob last week not now  Cardiovascular: Positive for chest pain and leg swelling. Negative for palpitations, orthopnea and PND.       Had right side of chest last week, not now  Gastrointestinal: Negative for abdominal pain.   Genitourinary: Negative for dysuria.  Musculoskeletal: Negative for back pain, falls and joint pain.  Skin: Negative for itching and rash.  Neurological: Negative for dizziness and loss of consciousness.  Endo/Heme/Allergies: Bruises/bleeds easily.  Psychiatric/Behavioral: Positive for depression. Negative for memory loss. The patient is not nervous/anxious and does not have insomnia.        Some family stressors    Health Maintenance  Topic Date Due  . COVID-19 Vaccine (4 - Booster) 02/14/2021  . TETANUS/TDAP  07/07/2025  . INFLUENZA VACCINE  Completed  . DEXA SCAN  Completed  . PNA vac Low Risk Adult  Completed    Physical Exam: Vitals:   09/30/20 1527  BP: 122/72  Temp: 97.7 F (36.5 C)  TempSrc: Temporal  Weight: 207 lb (93.9 kg)  Height: 5\' 4"  (1.626 m)   Body mass index is 35.53 kg/m. Physical Exam Vitals reviewed.  Constitutional:      General: She is not in acute distress.    Appearance: Normal appearance. She is obese. She is not toxic-appearing.  HENT:     Head: Normocephalic and atraumatic.  Cardiovascular:     Rate and Rhythm: Rhythm irregular.     Heart sounds: No murmur heard.   Pulmonary:     Effort: Pulmonary effort is normal.     Breath sounds: No wheezing, rhonchi or rales.     Comments: Does not move air well Abdominal:     General: Bowel sounds are normal.  Musculoskeletal:        General: Normal range of motion.     Right lower leg: No edema.     Left lower leg: No edema.     Comments: Uses power chair long distance to clinic, walker on unit  Skin:    General: Skin is warm and dry.  Neurological:     General: No focal deficit present.     Mental Status: She is alert and oriented to person, place, and time.  Psychiatric:     Comments: Appears near tears at times during visit and admits to increased depression recently     Labs reviewed: Basic Metabolic Panel: Recent Labs    10/15/19 0600 10/23/19 0000 11/29/19 0300  12/09/19 1009 09/23/20 1454  NA  --    < > 145 142 147*  K  --    < > 4.3 4.3 4.7  CL  --    < > 106 107 103  CO2  --    < > 29* 25 31*  GLUCOSE  --   --   --  122* 99  BUN  --    < > 25* 18 22  CREATININE  --    < > 1.7* 1.98* 1.77*  CALCIUM  --    < > 8.8 8.8* 9.4  TSH 3.58  --  4.09  --   --    < > = values in this interval not displayed.   Liver Function Tests: Recent Labs    10/15/19 0000 11/29/19 0300 12/09/19 1009  AST  --   --  16  ALT  --   --  13  ALKPHOS  --   --  82  BILITOT  --   --  0.7  PROT  --   --  6.7  ALBUMIN 3.9 3.7 3.6   No results for input(s): LIPASE, AMYLASE in the last 8760 hours. No results for input(s): AMMONIA in the last 8760 hours. CBC: Recent Labs    12/09/19 1009  WBC 8.1  HGB 12.8  HCT 41.9  MCV 102.9*  PLT 195   Lipid Panel: No results for input(s): CHOL, HDL, LDLCALC, TRIG, CHOLHDL, LDLDIRECT in the last 8760 hours. Lab Results  Component Value Date   HGBA1C 5.5 09/06/2016    Procedures since last visit: CUP [place]  Result Date: 09/23/2020 Pacemaker check in clinic. Normal device function. Thresholds, sensing, impedances consistent with previous measurements. Device programmed to maximize longevity. AT/AF burden < 1%, 47 AMS episodes with 29 EGMs that show AT with longest episode  of 1 min 26 seconds. No high ventricular rates noted. Device programmed at appropriate safety margins. Histogram distribution appropriate for patient activity level. Device programmed to optimize intrinsic conduction. Estimated longevity 1 year 2 months. Patient enrolled in remote follow-up and next remote 11/16/20. Patient education completed. Follow-up with SK in 6 months.   Assessment/Plan 1. Depression, major, single episode, moderate (HCC) - agrees to increased dose in zoloft--may need more than 50mg  even to feel better, but we'll start here and see where she is in 6 wks  - sertraline (ZOLOFT) 50 MG tablet; Take 1 tablet  (50 mg total) by mouth daily.  Dispense: 30 tablet; Refill: 3  2. DNR (do not resuscitate) -reentered into system again - Do not attempt resuscitation (DNR)  3. Paroxysmal atrial fibrillation (HCC) -rate controlled, no palpitations, cont toprol  4. Hypercoagulable state due to atrial fibrillation ([place]) -continues on xarelto without any bleeding   5. Chronic diastolic heart failure (HCC) -just had some volume overload and treated with increased torsemide with resolution, sob improved and has lung sounds on right again (has general diminished sounds, however, overall)  6. OSA treated with BiPAP -continues her bipap at hs--has early am congestion and coughing up mucus which is longstanding on this  7. MDS (myelodysplastic syndrome), low grade ([place]) -last cbc in march--may have one to be done before visit originally intended for next week; if not done before I see her next, will order along with repeat tsh   Labs/tests ordered:  Needs cbc, tsh if not already ordered  Next appt:  11/11/2020  Anisia Leija L. Lashanna Angelo, D.O. [facility] [address], [place] [address] Cell Phone (Mon-Fri 8am-5pm):  [phone] On Call:  [phone] & follow prompts after 5pm & weekends Office Phone:  [phone] Office Fax:  [phone]

## 2020-10-05 ENCOUNTER — Telehealth: Payer: Self-pay

## 2020-10-05 DIAGNOSIS — E039 Hypothyroidism, unspecified: Secondary | ICD-10-CM | POA: Diagnosis not present

## 2020-10-05 DIAGNOSIS — E785 Hyperlipidemia, unspecified: Secondary | ICD-10-CM | POA: Diagnosis not present

## 2020-10-05 DIAGNOSIS — I480 Paroxysmal atrial fibrillation: Secondary | ICD-10-CM | POA: Diagnosis not present

## 2020-10-05 DIAGNOSIS — D509 Iron deficiency anemia, unspecified: Secondary | ICD-10-CM | POA: Diagnosis not present

## 2020-10-05 LAB — CBC: RBC: 3.6 — AB (ref 3.87–5.11)

## 2020-10-05 LAB — COMPREHENSIVE METABOLIC PANEL WITH GFR
Albumin: 3.4 — AB (ref 3.5–5.0)
Calcium: 8.8 (ref 8.7–10.7)
Globulin: 2.4

## 2020-10-05 LAB — LIPID PANEL
Cholesterol: 168 (ref 0–200)
HDL: 46 (ref 35–70)
LDL Cholesterol: 105
Triglycerides: 86 (ref 40–160)

## 2020-10-05 LAB — CBC AND DIFFERENTIAL
HCT: 34 — AB (ref 36–46)
Hemoglobin: 11.4 — AB (ref 12.0–16.0)
Platelets: 158 (ref 150–399)
WBC: 7.3

## 2020-10-05 LAB — BASIC METABOLIC PANEL
BUN: 18 (ref 4–21)
CO2: 29 — AB (ref 13–22)
Chloride: 106 (ref 99–108)
Creatinine: 1.5 — AB (ref 0.5–1.1)
Glucose: 82
Potassium: 4.7 (ref 3.4–5.3)
Sodium: 144 (ref 137–147)

## 2020-10-05 NOTE — Telephone Encounter (Signed)
-----   Message from Duke Salvia, MD sent at 10/05/2020  3:09 PM EST ----- Please Inform Patient that labs show improved renal function 1.97>>1.7 Another patient with mildly elevated Na She should get this checked by her PCP in the next few weeks  Thanks

## 2020-10-05 NOTE — Telephone Encounter (Signed)
Spoke with Cindy Cervantes and advised of lab results per Dr Graciela Husbands as below.  Cindy Cervantes states she will follow up with her PCP.  Information forwarded to PCP as well

## 2020-10-05 NOTE — Progress Notes (Signed)
Noted.  We'll recheck BMP at [facility] in 2 weeks.  I also increased her zoloft for her depression when I saw her.  Thanks!

## 2020-10-07 ENCOUNTER — Encounter: Payer: Self-pay | Admitting: Internal Medicine

## 2020-10-08 ENCOUNTER — Telehealth: Payer: Self-pay

## 2020-10-08 ENCOUNTER — Encounter: Payer: Self-pay | Admitting: Nurse Practitioner

## 2020-10-08 ENCOUNTER — Ambulatory Visit (INDEPENDENT_AMBULATORY_CARE_PROVIDER_SITE_OTHER): Payer: Medicare Other | Admitting: Nurse Practitioner

## 2020-10-08 ENCOUNTER — Other Ambulatory Visit: Payer: Self-pay

## 2020-10-08 DIAGNOSIS — E2839 Other primary ovarian failure: Secondary | ICD-10-CM

## 2020-10-08 DIAGNOSIS — Z0000 Encounter for general adult medical examination without abnormal findings: Secondary | ICD-10-CM | POA: Diagnosis not present

## 2020-10-08 NOTE — Telephone Encounter (Signed)
Ms. heidi, maclin are scheduled for a virtual visit with your provider today.    Just as we do with appointments in the office, we must obtain your consent to participate.  Your consent will be active for this visit and any virtual visit you may have with one of our providers in the next 365 days.    If you have a MyChart account, I can also send a copy of this consent to you electronically.  All virtual visits are billed to your insurance company just like a traditional visit in the office.  As this is a virtual visit, video technology does not allow for your provider to perform a traditional examination.  This may limit your provider's ability to fully assess your condition.  If your provider identifies any concerns that need to be evaluated in person or the need to arrange testing such as labs, EKG, etc, we will make arrangements to do so.    Although advances in technology are sophisticated, we cannot ensure that it will always work on either your end or our end.  If the connection with a video visit is poor, we may have to switch to a telephone visit.  With either a video or telephone visit, we are not always able to ensure that we have a secure connection.   I need to obtain your verbal consent now.   Are you willing to proceed with your visit today?   YANINA KNUPP has provided verbal consent on 10/08/2020 for a virtual visit (video or telephone).   Elveria Royals, CMA 10/08/2020  3:13 PM

## 2020-10-08 NOTE — Progress Notes (Signed)
This service is provided via telemedicine  No vital signs collected/recorded due to the encounter was a telemedicine visit.   Location of patient (ex: home, work): Home  Patient consents to a telephone visit: Yes, See encounter dated 10/08/2020  Location of the provider (ex: office, home): [facility]  Name of any referring provider:  Bufford Spikes, DO  Names of all persons participating in the telemedicine service and their role in the encounter:  Abbey Chatters, Nurse Practitioner, Elveria Royals, CMA, and patient.   Time spent on call:  9 minutes with medical assistant

## 2020-10-08 NOTE — Progress Notes (Signed)
Subjective:   Cindy Cervantes is a 87 y.o. female who presents for Medicare Annual (Subsequent) preventive examination.  Review of Systems     Cardiac Risk Factors include: obesity (BMI >30kg/m2);hypertension;dyslipidemia;advanced age (>40men, >64 women);sedentary lifestyle     Objective:    There were no vitals filed for this visit. There is no height or weight on file to calculate BMI.  Advanced Directives 10/08/2020 09/30/2020 04/08/2020 12/17/2019 10/16/2019 04/29/2019 03/07/2018  Does Patient Have a Medical Advance Directive? Yes Yes Yes Yes Yes Yes Yes  Type of Paramedic of [place];Living will [place];Living will [place];Out of facility DNR (pink MOST or yellow form) [place];Living will Out of facility DNR (pink MOST or yellow form);Healthcare Power of [place];Living will;Out of facility DNR (pink MOST or yellow form) [place];Out of facility DNR (pink MOST or yellow form)  Does patient want to make changes to medical advance directive? No - Patient declined No - Patient declined No - Patient declined - No - Patient declined No - Patient declined No - Patient declined  Copy of [place] in Chart? Yes - validated most recent copy scanned in chart (See row information) Yes - validated most recent copy scanned in chart (See row information) Yes - validated most recent copy scanned in chart (See row information) No - copy requested - Yes - validated most recent copy scanned in chart (See row information) Yes  Pre-existing out of facility DNR order (yellow form or pink MOST form) - - Yellow form placed in chart (order not valid for inpatient use) - - - Yellow form placed in chart (order not valid for inpatient use)    Current Medications (verified) Outpatient Encounter Medications as of 10/08/2020  Medication Sig  . acetaminophen (TYLENOL) 325  MG tablet Take 325-650 mg by mouth every 4 (four) hours as needed for moderate pain.  Marland Kitchen allopurinol (ZYLOPRIM) 100 MG tablet Take 100 mg by mouth daily.  Marland Kitchen dextromethorphan-guaiFENesin (MUCINEX DM) 30-600 MG 12hr tablet Take 1 tablet by mouth 2 (two) times daily as needed (cough / congestion).  . ezetimibe (ZETIA) 10 MG tablet Take 10 mg by mouth every evening.   . fluticasone (FLONASE) 50 MCG/ACT nasal spray Place 2 sprays into the nose 2 (two) times daily. 2 sprays in each nostril  . levothyroxine (SYNTHROID, LEVOTHROID) 75 MCG tablet Take 75 mcg by mouth daily before breakfast.   . metoprolol succinate (TOPROL XL) 25 MG 24 hr tablet Take 1 tablet (25 mg total) by mouth at bedtime.  . polyethylene glycol powder (GLYCOLAX/MIRALAX) 17 GM/SCOOP powder Take 17 g by mouth as needed for mild constipation.   . potassium chloride SA (K-DUR,KLOR-CON) 20 MEQ tablet Take 40 mEq by mouth daily.  . Rivaroxaban (XARELTO) 15 MG TABS tablet Take 1 tablet (15 mg total) by mouth daily with supper.  . sertraline (ZOLOFT) 50 MG tablet Take 1 tablet (50 mg total) by mouth daily.  Marland Kitchen torsemide (DEMADEX) 20 MG tablet Take 20 mg by mouth daily.   No facility-administered encounter medications on file as of 10/08/2020.    Allergies (verified) Codeine, Iron, Morphine, and Toviaz [fesoterodine fumarate]   History: Past Medical History:  Diagnosis Date  . Allergic rhinitis   . Anemia of renal disease 10/21/2011  . Anemia, iron deficiency 10/21/2011  . Cholelithiasis   . Chronic diastolic heart failure ([place])   . Colon polyp   .  Constipation   . Cough    2nd to reflux  . Debility, unspecified   . Dehydration   . Depression   . Diaphragm dysfunction    Right hemidiaphragm  . Disorder of bone and cartilage, unspecified   . Diverticulosis of colon   . Dysphagia   . GERD (gastroesophageal reflux disease) 02/08/2013  . History of hyperkalemia in setting of spironolactone 09/2010  . Hyperlipidemia   . Hypertension    . Hypopotassemia   . Hypothyroidism 02/08/2013  . Long term (current) use of anticoagulants   . MDS (myelodysplastic syndrome), low grade ([place]) 10/21/2011  . Obesity   . Obesity hypoventilation syndrome ([place])   . OSA (obstructive sleep apnea)   . Osteoarthrosis, unspecified whether generalized or localized, unspecified site   . Other specified disease of white blood cells   . Pacemaker    Implanted December 2012  . Paroxysmal A-fib (HCC) with RVR   Amiodarone  . Reflux esophagitis   . Rosacea 05/15/2013  . Senile cataract   . Sinus node dysfunction (HCC)   . Tracheobronchomalacia   . Unspecified vitamin D deficiency   . Urinary incontinence   . Xerostomia    Past Surgical History:  Procedure Laterality Date  . CARDIOVERSION N/A 12/17/2019   Procedure: CARDIOVERSION;  Surgeon: Skeet Latch, MD;  Location: [facility];  Service: Cardiovascular;  Laterality: N/A;  . CATARACT EXTRACTION  2008  . PACEMAKER PLACEMENT  10/21/2010   STJ, dural chamber Dr. [name]  . TOTAL KNEE ARTHROPLASTY  1999   right knee  . TOTAL KNEE ARTHROPLASTY  2003   left knee  . TUBAL LIGATION  1964  . Tummy tuck  2000   pt "almost died"; respiratory distress, became delrious   Family History  Problem Relation Age of Onset  . Heart disease Father   . Parkinson's disease Sister   . Heart disease Brother   . Parkinson's disease Brother    Social History   Socioeconomic History  . Marital status: Widowed    Spouse name: Not on file  . Number of children: Not on file  . Years of education: Not on file  . Highest education level: Not on file  Occupational History  . Occupation: retired    Fish farm manager: RETIRED    Comment: Network engineer   Tobacco Use  . Smoking status: Former Smoker    Packs/day: 0.50    Types: Cigarettes    Quit date: 10/03/1982    Years since quitting: 38.0  . Smokeless tobacco: Never Used  Vaping Use  . Vaping Use: Never used  Substance and Sexual Activity  . Alcohol use: Yes     Comment: very rarely.  none in one year  . Drug use: No  . Sexual activity: Never  Other Topics Concern  . Not on file  Social History Narrative   Widowed 2002 (married 1955), originally form [place]. 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 Moved from [place], [place] to [place] [employer], [place] section 2009. Moved to [facility] Living section 2012.    Non smoker, mininmal alcohol.    Has POA   Power wheelchair, walker   Exercise none               Social Determinants of Health   Financial Resource Strain: Not on file  Food Insecurity: Not on file  Transportation Needs: Not on file  Physical Activity: Not on file  Stress: Not on file  Social Connections: Not on  file    Tobacco Counseling Counseling given: Not Answered   Clinical Intake:  Pre-visit preparation completed: Yes  Pain : No/denies pain     BMI - recorded: 35 Nutritional Status: BMI > 30  Obese Nutritional Risks: None Diabetes: No  How often do you need to have someone help you when you read instructions, pamphlets, or other written materials from your doctor or pharmacy?: 1 - Never  Diabetic?no         Activities of Daily Living In your present state of health, do you have any difficulty performing the following activities: 10/08/2020  Hearing? Y  Vision? N  Difficulty concentrating or making decisions? N  Walking or climbing stairs? Y  Dressing or bathing? N  Doing errands, shopping? N  Preparing Food and eating ? N  Using the Toilet? N  In the past six months, have you accidently leaked urine? Y  Do you have problems with loss of bowel control? N  Managing your Medications? Y  Comment [facility] manages  Managing your Finances? N  Comment son and daughter manage  Housekeeping or managing your Housekeeping? Y  Comment managed by [facility]  Some recent data might be hidden    Patient Care Team: Gayland Curry, DO as PCP -  General (Geriatric Medicine) Mardene Celeste, NP as Nurse Practitioner (Geriatric Medicine) [facility] N[facility] Pear, MD as Consulting Physician (Pulmonary Disease) Larey Dresser, MD as Consulting Physician (Cardiology) Lafayette Dragon, MD (Inactive) as Consulting Physician (Gastroenterology) Volanda Napoleon, MD as Consulting Physician (Oncology) Ninetta Lights, MD (Inactive) as Consulting Physician (Orthopedic Surgery) Sydnee Levans, MD as Consulting Physician (Dermatology) Barbaraann Cao, OD as Referring Physician (Optometry)  Indicate any recent Medical Services you may have received from other than [facility] providers in the past year (date may be approximate).     Assessment:   This is a routine wellness examination for Fate.  Hearing/Vision screen  Hearing Screening   125Hz  250Hz  500Hz  1000Hz  2000Hz  3000Hz  4000Hz  6000Hz  8000Hz   Right ear:           Left ear:           Comments: Patient has problems with hearing from time to time.  Vision Screening Comments: Patient has no vision. Patient had eye exam within past year. Patient sees Dr. Sabra Heck  Dietary issues and exercise activities discussed: Current Exercise Habits: The patient does not participate in regular exercise at present, Exercise limited by: respiratory conditions(s)  Goals    . Exercise 3x per week (30 min per time)    . Patient Stated     Better management of depression      Depression Screen PHQ 2/9 Scores 10/08/2020 09/30/2020 04/08/2020 10/16/2019 04/29/2019 04/10/2019 09/12/2018  PHQ - 2 Score - - 0 0 4 0 0  PHQ- 9 Score - - - - 7 - -  Exception Documentation Other- indicate reason in comment box Other- indicate reason in comment box - - - - -  Not completed Patient has feelings of depression and is taking Zoloft to help deal with it - - - - - -    Fall Risk Fall Risk  10/08/2020 09/30/2020 04/08/2020 10/16/2019 04/29/2019  Falls in the past year? 0 0 0 0 1  Comment - - - - -   Number falls in past yr: 0 0 0 0 0  Injury with Fall? 0 0 0 0 1  Risk for fall due to : - - - -  History of fall(s);Impaired balance/gait  Follow up - - - - Falls evaluation completed;Education provided    FALL RISK PREVENTION PERTAINING TO THE HOME:  Any stairs in or around the home? No  If so, are there any without handrails? No  Home free of loose throw rugs in walkways, pet beds, electrical cords, etc? Yes  Adequate lighting in your home to reduce risk of falls? Yes   ASSISTIVE DEVICES UTILIZED TO PREVENT FALLS:  Life alert? Yes  Use of a cane, walker or w/c? Yes  Grab bars in the bathroom? Yes  Shower chair or bench in shower? No  Elevated toilet seat or a handicapped toilet? No   TIMED UP AND GO:  Was the test performed? No .    Cognitive Function: MMSE - Mini Mental State Exam 04/29/2019 10/19/2017 10/19/2017 11/02/2016 10/29/2015  Orientation to time 5 5 5 5 5   Orientation to Place 5 5 5 5 5   Registration 3 3 3 3 3   Attention/ Calculation 5 5 5 5 5   Recall 3 3 3 3 3   Language- name 2 objects 2 2 2 2 2   Language- repeat 1 1 1 1 1   Language- follow 3 step command 3 3 3 3 3   Language- read & follow direction 1 1 1 1 1   Write a sentence 1 1 1 1 1   Copy design 1 1 1 1 1   Total score 30 30 30 30 30      6CIT Screen 10/08/2020  What Year? 0 points  What month? 0 points  What time? 0 points  Count back from 20 0 points  Months in reverse 0 points  Repeat phrase 0 points  Total Score 0    Immunizations Immunization History  Administered Date(s) Administered  . Influenza Inj Mdck Quad Pf 07/21/2016  . Influenza Split 07/08/2011  . Influenza Whole 07/06/2009, 07/03/2012, 07/16/2013  . Influenza, High Dose Seasonal PF 08/01/2019, 07/24/2020  . Influenza,inj,Quad PF,6+ Mos 07/24/2018  . Influenza-Unspecified 07/08/2014, 07/16/2015, 07/27/2017  . Moderna Sars-Covid-2 Vaccination 10/14/2019, 11/12/2019  . PFIZER SARS-COV-2 Vaccination 08/17/2020  . PPD Test 11/02/2011   . Pneumococcal Conjugate-13 01/13/2015  . Pneumococcal Polysaccharide-23 10/27/2010  . Tdap 07/08/2015  . Zoster Recombinat (Shingrix) 10/11/2017, 01/05/2018    TDAP status: Up to date  Flu Vaccine status: Up to date  Pneumococcal vaccine status: Up to date  Covid-19 vaccine status: Completed vaccines  Qualifies for Shingles Vaccine? Yes   Zostavax completed No   Shingrix Completed?: Yes  Screening Tests Health Maintenance  Topic Date Due  . COVID-19 Vaccine (4 - Booster) 02/14/2021  . TETANUS/TDAP  07/07/2025  . INFLUENZA VACCINE  Completed  . DEXA SCAN  Completed  . PNA vac Low Risk Adult  Completed    Health Maintenance  There are no preventive care reminders to display for this patient.  Colorectal cancer screening: No longer required.   Mammogram status: No longer required due to AGE.  Bone Density status: Ordered today. Pt provided with contact info and advised to call to schedule appt.  Lung Cancer Screening: (Low Dose CT Chest recommended if Age 55-80 years, 30 pack-year currently smoking OR have quit w/in 15years.) does not qualify.   Lung Cancer Screening Referral: na  Additional Screening:  Hepatitis C Screening: does not qualify; Completed na  Vision Screening: Recommended annual ophthalmology exams for early detection of glaucoma and other disorders of the eye. Is the patient up to date with their annual eye exam?  Yes  Who  is the provider or what is the name of the office in which the patient attends annual eye exams? [facility] If pt is not established with a provider, would they like to be referred to a provider to establish care? No .   Dental Screening: Recommended annual dental exams for proper oral hygiene  Community Resource Referral / Chronic Care Management: CRR required this visit?  No   CCM required this visit?  No      Plan:     I have personally reviewed and noted the following in the patient's chart:   . Medical and social  history . Use of alcohol, tobacco or illicit drugs  . Current medications and supplements . Functional ability and status . Nutritional status . Physical activity . Advanced directives . List of other physicians . Hospitalizations, surgeries, and ER visits in previous 12 months . Vitals . Screenings to include cognitive, depression, and falls . Referrals and appointments  In addition, I have reviewed and discussed with patient certain preventive protocols, quality metrics, and best practice recommendations. A written personalized care plan for preventive services as well as general preventive health recommendations were provided to patient.     Sharon Seller, NP   10/08/2020    Virtual Visit via Telephone Note  I connected with@ on 10/08/20 at  3:45 PM EST by telephone and verified that I am speaking with the correct person using two identifiers.  Location: Patient: home Provider: [facility]   I discussed the limitations, risks, security and privacy concerns of performing an evaluation and management service by telephone and the availability of in person appointments. I also discussed with the patient that there may be a patient responsible charge related to this service. The patient expressed understanding and agreed to proceed.   I discussed the assessment and treatment plan with the patient. The patient was provided an opportunity to ask questions and all were answered. The patient agreed with the plan and demonstrated an understanding of the instructions.   The patient was advised to call back or seek an in-person evaluation if the symptoms worsen or if the condition fails to improve as anticipated.  I provided 18 minutes of non-face-to-face time during this encounter.  Janene Harvey. [employer] printed and mailed

## 2020-10-08 NOTE — Patient Instructions (Addendum)
Cindy Cervantes , Thank you for taking time to come for your Medicare Wellness Visit. I appreciate your ongoing commitment to your health goals. Please review the following plan we discussed and let me know if I can assist you in the future.   Screening recommendations/referrals: Colonoscopy agedout Mammogram aged out Bone Density -ORDERED Today- [phone] to call to schedule.  Recommended yearly ophthalmology/optometry visit for glaucoma screening and checkup Recommended yearly dental visit for hygiene and checkup  Vaccinations: Influenza vaccine up to date Pneumococcal vaccine up to date Tdap vaccine up to date Shingles vaccine up to date    Advanced directives: on file.   Conditions/risks identified: advanced age, obesity, debility, depression.   Next appointment: 1 year   To start gratitude journal -- to write down 3 things you are grateful/thankful for every day.    Preventive Care 56 Years and Older, Female Preventive care refers to lifestyle choices and visits with your health care provider that can promote health and wellness. What does preventive care include?  A yearly physical exam. This is also called an annual well check.  Dental exams once or twice a year.  Routine eye exams. Ask your health care provider how often you should have your eyes checked.  Personal lifestyle choices, including:  Daily care of your teeth and gums.  Regular physical activity.  Eating a healthy diet.  Avoiding tobacco and drug use.  Limiting alcohol use.  Practicing safe sex.  Taking low-dose aspirin every day.  Taking vitamin and mineral supplements as recommended by your health care provider. What happens during an annual well check? The services and screenings done by your health care provider during your annual well check will depend on your age, overall health, lifestyle risk factors, and family history of disease. Counseling  Your health care provider may ask you  questions about your:  Alcohol use.  Tobacco use.  Drug use.  Emotional well-being.  Home and relationship well-being.  Sexual activity.  Eating habits.  History of falls.  Memory and ability to understand (cognition).  Work and work Astronomer.  Reproductive health. Screening  You may have the following tests or measurements:  Height, weight, and BMI.  Blood pressure.  Lipid and cholesterol levels. These may be checked every 5 years, or more frequently if you are over 17 years old.  Skin check.  Lung cancer screening. You may have this screening every year starting at age 36 if you have a 30-pack-year history of smoking and currently smoke or have quit within the past 15 years.  Fecal occult blood test (FOBT) of the stool. You may have this test every year starting at age 27.  Flexible sigmoidoscopy or colonoscopy. You may have a sigmoidoscopy every 5 years or a colonoscopy every 10 years starting at age 46.  Hepatitis C blood test.  Hepatitis B blood test.  Sexually transmitted disease (STD) testing.  Diabetes screening. This is done by checking your blood sugar (glucose) after you have not eaten for a while (fasting). You may have this done every 1-3 years.  Bone density scan. This is done to screen for osteoporosis. You may have this done starting at age 43.  Mammogram. This may be done every 1-2 years. Talk to your health care provider about how often you should have regular mammograms. Talk with your health care provider about your test results, treatment options, and if necessary, the need for more tests. Vaccines  Your health care provider may recommend certain vaccines, such as:  Influenza vaccine. This is recommended every year.  Tetanus, diphtheria, and acellular pertussis (Tdap, Td) vaccine. You may need a Td booster every 10 years.  Zoster vaccine. You may need this after age 19.  Pneumococcal 13-valent conjugate (PCV13) vaccine. One dose is  recommended after age 32.  Pneumococcal polysaccharide (PPSV23) vaccine. One dose is recommended after age 51. Talk to your health care provider about which screenings and vaccines you need and how often you need them. This information is not intended to replace advice given to you by your health care provider. Make sure you discuss any questions you have with your health care provider. Document Released: 10/16/2015 Document Revised: 06/08/2016 Document Reviewed: 07/21/2015 Elsevier Interactive Patient Education  2017 [place] Prevention in the Home Falls can cause injuries. They can happen to people of all ages. There are many things you can do to make your home safe and to help prevent falls. What can I do on the outside of my home?  Regularly fix the edges of walkways and driveways and fix any cracks.  Remove anything that might make you trip as you walk through a door, such as a raised step or threshold.  Trim any bushes or trees on the path to your home.  Use bright outdoor lighting.  Clear any walking paths of anything that might make someone trip, such as rocks or tools.  Regularly check to see if handrails are loose or broken. Make sure that both sides of any steps have handrails.  Any raised decks and porches should have guardrails on the edges.  Have any leaves, snow, or ice cleared regularly.  Use sand or salt on walking paths during winter.  Clean up any spills in your garage right away. This includes oil or grease spills. What can I do in the bathroom?  Use night lights.  Install grab bars by the toilet and in the tub and shower. Do not use towel bars as grab bars.  Use non-skid mats or decals in the tub or shower.  If you need to sit down in the shower, use a plastic, non-slip stool.  Keep the floor dry. Clean up any water that spills on the floor as soon as it happens.  Remove soap buildup in the tub or shower regularly.  Attach bath mats  securely with double-sided non-slip rug tape.  Do not have throw rugs and other things on the floor that can make you trip. What can I do in the bedroom?  Use night lights.  Make sure that you have a light by your bed that is easy to reach.  Do not use any sheets or blankets that are too big for your bed. They should not hang down onto the floor.  Have a firm chair that has side arms. You can use this for support while you get dressed.  Do not have throw rugs and other things on the floor that can make you trip. What can I do in the kitchen?  Clean up any spills right away.  Avoid walking on wet floors.  Keep items that you use a lot in easy-to-reach places.  If you need to reach something above you, use a strong step stool that has a grab bar.  Keep electrical cords out of the way.  Do not use floor polish or wax that makes floors slippery. If you must use wax, use non-skid floor wax.  Do not have throw rugs and other things on the floor that  can make you trip. What can I do with my stairs?  Do not leave any items on the stairs.  Make sure that there are handrails on both sides of the stairs and use them. Fix handrails that are broken or loose. Make sure that handrails are as long as the stairways.  Check any carpeting to make sure that it is firmly attached to the stairs. Fix any carpet that is loose or worn.  Avoid having throw rugs at the top or bottom of the stairs. If you do have throw rugs, attach them to the floor with carpet tape.  Make sure that you have a light switch at the top of the stairs and the bottom of the stairs. If you do not have them, ask someone to add them for you. What else can I do to help prevent falls?  Wear shoes that:  Do not have high heels.  Have rubber bottoms.  Are comfortable and fit you well.  Are closed at the toe. Do not wear sandals.  If you use a stepladder:  Make sure that it is fully opened. Do not climb a closed  stepladder.  Make sure that both sides of the stepladder are locked into place.  Ask someone to hold it for you, if possible.  Clearly mark and make sure that you can see:  Any grab bars or handrails.  First and last steps.  Where the edge of each step is.  Use tools that help you move around (mobility aids) if they are needed. These include:  Canes.  Walkers.  Scooters.  Crutches.  Turn on the lights when you go into a dark area. Replace any light bulbs as soon as they burn out.  Set up your furniture so you have a clear path. Avoid moving your furniture around.  If any of your floors are uneven, fix them.  If there are any pets around you, be aware of where they are.  Review your medicines with your doctor. Some medicines can make you feel dizzy. This can increase your chance of falling. Ask your doctor what other things that you can do to help prevent falls. This information is not intended to replace advice given to you by your health care provider. Make sure you discuss any questions you have with your health care provider. Document Released: 07/16/2009 Document Revised: 02/25/2016 Document Reviewed: 10/24/2014 Elsevier Interactive Patient Education  2017 [employer].

## 2020-10-12 DIAGNOSIS — R0602 Shortness of breath: Secondary | ICD-10-CM | POA: Diagnosis not present

## 2020-10-14 LAB — NOVEL CORONAVIRUS, NAA: SARS-CoV-2, NAA: NOT DETECTED

## 2020-10-15 DIAGNOSIS — Z9189 Other specified personal risk factors, not elsewhere classified: Secondary | ICD-10-CM | POA: Diagnosis not present

## 2020-10-15 DIAGNOSIS — Z20828 Contact with and (suspected) exposure to other viral communicable diseases: Secondary | ICD-10-CM | POA: Diagnosis not present

## 2020-10-20 ENCOUNTER — Encounter: Payer: Self-pay | Admitting: *Deleted

## 2020-10-20 ENCOUNTER — Other Ambulatory Visit: Payer: Medicare Other

## 2020-10-21 DIAGNOSIS — E870 Hyperosmolality and hypernatremia: Secondary | ICD-10-CM | POA: Diagnosis not present

## 2020-11-02 ENCOUNTER — Non-Acute Institutional Stay: Payer: Medicare Other | Admitting: Adult Health

## 2020-11-02 ENCOUNTER — Encounter: Payer: Self-pay | Admitting: Adult Health

## 2020-11-02 DIAGNOSIS — R1904 Left lower quadrant abdominal swelling, mass and lump: Secondary | ICD-10-CM | POA: Diagnosis not present

## 2020-11-02 DIAGNOSIS — K5901 Slow transit constipation: Secondary | ICD-10-CM | POA: Diagnosis not present

## 2020-11-02 NOTE — Progress Notes (Signed)
Location:  Occupational psychologist of Service:  ALF (13) Provider:   Cindi Carbon, ANP [place] [phone]   Gayland Curry, DO  Patient Care Team: Gayland Curry, DO as PCP - General (Geriatric Medicine) Mardene Celeste, NP as Nurse Practitioner (Geriatric Medicine) Community, Well Nance Pear, MD as Consulting Physician (Pulmonary Disease) Larey Dresser, MD as Consulting Physician (Cardiology) Lafayette Dragon, MD (Inactive) as Consulting Physician (Gastroenterology) Volanda Napoleon, MD as Consulting Physician (Oncology) Ninetta Lights, MD (Inactive) as Consulting Physician (Orthopedic Surgery) Sydnee Levans, MD as Consulting Physician (Dermatology) Barbaraann Cao, OD as Referring Physician (Optometry)  Extended Emergency Contact Information Primary Emergency Contact: Gooding,Leslie Address: [address]          [place], [place] Phone: [phone] Relation: Daughter Secondary Emergency Contact: Laurrie, [place] Mobile Phone: [phone] Relation: Son  Code Status:  DNR Goals of care: Advanced Directive information Advanced Directives 10/08/2020  Does Patient Have a Medical Advance Directive? Yes  Type of Paramedic of [place];Living will  Does patient want to make changes to medical advance directive? No - Patient declined  Copy of [place] in Chart? Yes - validated most recent copy scanned in chart (See row information)  Pre-existing out of facility DNR order (yellow form or pink MOST form) -     Chief Complaint  Patient presents with  . Acute Visit    Constipation and "knot on left abd"    HPI:  Pt is a 87 y.o. female seen today for an acute visit for constipation and a mass in the LLQ. Ms. Husk reports feeling bloated and has early satiety. She has not lost weight. She has a hx of RLQ mass that was examined  with ultrasound and CT of the abd with no clear diagnosis but was possibly due to scar tissue vs. Thickening or abnormality of the right oblique muscle. She has some abd pain when she stand up on the left side but none at rest. She is not having a fever or blood in the stool. She reports a hx of tummy tuck with complications and a long colon that twists on itself. She has not had a BM in 3 days. Took Miralax on Friday 1/28 with no results. She was afraid to take more over the weekend until she was seen by a provider. Felt like the area of concern could "burst".     Past Medical History:  Diagnosis Date  . Allergic rhinitis   . Anemia of renal disease 10/21/2011  . Anemia, iron deficiency 10/21/2011  . Cholelithiasis   . Chronic diastolic heart failure ([place])   . Colon polyp   . Constipation   . Cough    2nd to reflux  . Debility, unspecified   . Dehydration   . Depression   . Diaphragm dysfunction    Right hemidiaphragm  . Disorder of bone and cartilage, unspecified   . Diverticulosis of colon   . Dysphagia   . GERD (gastroesophageal reflux disease) 02/08/2013  . History of hyperkalemia in setting of spironolactone 09/2010  . Hyperlipidemia   . Hypertension   . Hypopotassemia   . Hypothyroidism 02/08/2013  . Long term (current) use of anticoagulants   . MDS (myelodysplastic syndrome), low grade ([place]) 10/21/2011  . Obesity   . Obesity hypoventilation syndrome ([place])   . OSA (obstructive sleep apnea)   . Osteoarthrosis, unspecified whether  generalized or localized, unspecified site   . Other specified disease of white blood cells   . Pacemaker    Implanted December 2012  . Paroxysmal A-fib (HCC) with RVR   Amiodarone  . Reflux esophagitis   . Rosacea 05/15/2013  . Senile cataract   . Sinus node dysfunction (HCC)   . Tracheobronchomalacia   . Unspecified vitamin D deficiency   . Urinary incontinence   . Xerostomia    Past Surgical History:  Procedure Laterality Date  .  CARDIOVERSION N/A 12/17/2019   Procedure: CARDIOVERSION;  Surgeon: Skeet Latch, MD;  Location: [facility];  Service: Cardiovascular;  Laterality: N/A;  . CATARACT EXTRACTION  2008  . PACEMAKER PLACEMENT  10/21/2010   STJ, dural chamber Dr. [name]  . TOTAL KNEE ARTHROPLASTY  1999   right knee  . TOTAL KNEE ARTHROPLASTY  2003   left knee  . TUBAL LIGATION  1964  . Tummy tuck  2000   pt "almost died"; respiratory distress, became delrious    Allergies  Allergen Reactions  . Codeine     Extreme lethargy  . Iron     By infusion - back spasms   . Morphine     Hallucinations, altered mental state  . Toviaz [Fesoterodine Fumarate]     hyperactive    Outpatient Encounter Medications as of 11/02/2020  Medication Sig  . acetaminophen (TYLENOL) 325 MG tablet Take 325-650 mg by mouth every 4 (four) hours as needed for moderate pain.  Marland Kitchen allopurinol (ZYLOPRIM) 100 MG tablet Take 100 mg by mouth daily.  Marland Kitchen dextromethorphan-guaiFENesin (MUCINEX DM) 30-600 MG 12hr tablet Take 1 tablet by mouth 2 (two) times daily as needed (cough / congestion).  . ezetimibe (ZETIA) 10 MG tablet Take 10 mg by mouth every evening.   . fluticasone (FLONASE) 50 MCG/ACT nasal spray Place 2 sprays into the nose 2 (two) times daily. 2 sprays in each nostril  . levothyroxine (SYNTHROID, LEVOTHROID) 75 MCG tablet Take 75 mcg by mouth daily before breakfast.   . metoprolol succinate (TOPROL XL) 25 MG 24 hr tablet Take 1 tablet (25 mg total) by mouth at bedtime.  . polyethylene glycol powder (GLYCOLAX/MIRALAX) 17 GM/SCOOP powder Take 17 g by mouth as needed for mild constipation.   . potassium chloride SA (K-DUR,KLOR-CON) 20 MEQ tablet Take 40 mEq by mouth daily.  . Rivaroxaban (XARELTO) 15 MG TABS tablet Take 1 tablet (15 mg total) by mouth daily with supper.  . sertraline (ZOLOFT) 50 MG tablet Take 1 tablet (50 mg total) by mouth daily.  Marland Kitchen torsemide (DEMADEX) 20 MG tablet Take 20 mg by mouth daily.   No  facility-administered encounter medications on file as of 11/02/2020.    Review of Systems  Constitutional: Positive for appetite change. Negative for activity change, chills, diaphoresis, fatigue, fever and unexpected weight change.  HENT: Negative for congestion.   Respiratory: Negative for cough, shortness of breath and wheezing.   Cardiovascular: Negative for chest pain, palpitations and leg swelling.  Gastrointestinal: Positive for abdominal pain (LLQ mild) and constipation. Negative for abdominal distention, anal bleeding, blood in stool, diarrhea, nausea and rectal pain.  Genitourinary: Negative for difficulty urinating and dysuria.  Musculoskeletal: Positive for gait problem. Negative for arthralgias, back pain, joint swelling and myalgias.  Neurological: Negative for dizziness, tremors, seizures, syncope, facial asymmetry, speech difficulty, weakness, light-headedness, numbness and headaches.  Psychiatric/Behavioral: Negative for agitation, behavioral problems and confusion.    Immunization History  Administered Date(s) Administered  . Influenza Inj Mdck  Quad Pf 07/21/2016  . Influenza Split 07/08/2011  . Influenza Whole 07/06/2009, 07/03/2012, 07/16/2013  . Influenza, High Dose Seasonal PF 08/01/2019, 07/24/2020  . Influenza,inj,Quad PF,6+ Mos 07/24/2018  . Influenza-Unspecified 07/08/2014, 07/16/2015, 07/27/2017  . Moderna Sars-Covid-2 Vaccination 10/14/2019, 11/12/2019  . PFIZER(Purple Top)SARS-COV-2 Vaccination 08/17/2020  . PPD Test 11/02/2011  . Pneumococcal Conjugate-13 01/13/2015  . Pneumococcal Polysaccharide-23 10/27/2010  . Tdap 07/08/2015  . Zoster Recombinat (Shingrix) 10/11/2017, 01/05/2018   Pertinent  Health Maintenance Due  Topic Date Due  . INFLUENZA VACCINE  Completed  . DEXA SCAN  Completed  . PNA vac Low Risk Adult  Completed   Fall Risk  10/08/2020 09/30/2020 04/08/2020 10/16/2019 04/29/2019  Falls in the past year? 0 0 0 0 1  Comment - - - - -  Number  falls in past yr: 0 0 0 0 0  Injury with Fall? 0 0 0 0 1  Risk for fall due to : - - - - History of fall(s);Impaired balance/gait  Follow up - - - - Falls evaluation completed;Education provided   Functional Status Survey:    Vitals:   11/02/20 1618  BP: 137/76  Pulse: 62  Resp: 16  Temp: (!) 97.5 F (36.4 C)  SpO2: 94%   There is no height or weight on file to calculate BMI. Physical Exam Vitals and nursing note reviewed.  Constitutional:      General: She is not in acute distress.    Appearance: She is not diaphoretic.  HENT:     Head: Normocephalic and atraumatic.  Neck:     Vascular: No JVD.  Cardiovascular:     Rate and Rhythm: Normal rate. Rhythm irregular.     Heart sounds: No murmur heard.   Pulmonary:     Effort: Pulmonary effort is normal. No respiratory distress.     Breath sounds: Normal breath sounds. No wheezing.  Abdominal:     General: There is no distension.     Palpations: Abdomen is soft. There is mass (Firm non mobile large area in the LLQ ).     Tenderness: There is abdominal tenderness (LLQ). There is no right CVA tenderness, left CVA tenderness, guarding or rebound.     Hernia: No hernia is present.     Comments: Hypoactive x 4  Skin:    General: Skin is warm and dry.  Neurological:     Mental Status: She is alert and oriented to person, place, and time.     Labs reviewed: Recent Labs    12/09/19 1009 09/23/20 1454 10/05/20 0000  NA 142 147* 144  K 4.3 4.7 4.7  CL 107 103 106  CO2 25 31* 29*  GLUCOSE 122* 99  --   BUN 18 22 18   CREATININE 1.98* 1.77* 1.5*  CALCIUM 8.8* 9.4 8.8   Recent Labs    11/29/19 0300 12/09/19 1009 10/05/20 0000  AST  --  16  --   ALT  --  13  --   ALKPHOS  --  82  --   BILITOT  --  0.7  --   PROT  --  6.7  --   ALBUMIN 3.7 3.6 3.4*   Recent Labs    12/09/19 1009 10/05/20 0000  WBC 8.1 7.3  HGB 12.8 11.4*  HCT 41.9 34*  MCV 102.9*  --   PLT 195 158   Lab Results  Component Value Date    TSH 4.09 11/29/2019   Lab Results  Component Value Date  HGBA1C 5.5 09/06/2016   Lab Results  Component Value Date   CHOL 168 10/05/2020   HDL 46 10/05/2020   LDLCALC 105 10/05/2020   TRIG 86 10/05/2020   CHOLHDL 2.2 09/08/2010    Significant Diagnostic Results in last 30 days:  No results found.  Assessment/Plan  1. Slow transit constipation MOM 30 cc x 1 now Dulcolax supp as needed Continue miralax 17 grams qd prn. She is alert and oriented and wants to decide when to take this med as sometimes it can lead to diarrhea for her. I let her know that it is safe to take daily and that she may need this to prevent further episodes of constipation.   2. Abdominal mass, LLQ (left lower quadrant) There is a hard area in the LLQ that does not seem to be consistent with a lump of stool due to constipation. It is very similar to the RLQ mass that she currently has. Will order KUB to examine further. Does not appear to be a hernia. Possible due to scar tissue from prior tummy tuck.   Family/ staff Communication: resident   Labs/tests ordered:  KUB

## 2020-11-03 DIAGNOSIS — R109 Unspecified abdominal pain: Secondary | ICD-10-CM | POA: Diagnosis not present

## 2020-11-04 ENCOUNTER — Encounter: Payer: Self-pay | Admitting: Internal Medicine

## 2020-11-04 ENCOUNTER — Non-Acute Institutional Stay: Payer: Medicare Other | Admitting: Internal Medicine

## 2020-11-04 DIAGNOSIS — R1904 Left lower quadrant abdominal swelling, mass and lump: Secondary | ICD-10-CM

## 2020-11-04 DIAGNOSIS — K5901 Slow transit constipation: Secondary | ICD-10-CM

## 2020-11-04 DIAGNOSIS — M6289 Other specified disorders of muscle: Secondary | ICD-10-CM

## 2020-11-04 NOTE — Progress Notes (Signed)
Location:  Occupational psychologist of Service:  ALF (13) Provider:  Emery Dupuy L. [name], D.O., C.M.D.  Gayland Curry, DO  Patient Care Team: Gayland Curry, DO as PCP - General (Geriatric Medicine) Mardene Celeste, NP as Nurse Practitioner (Geriatric Medicine) Community, Well Nance Pear, MD as Consulting Physician (Pulmonary Disease) Larey Dresser, MD as Consulting Physician (Cardiology) Lafayette Dragon, MD (Inactive) as Consulting Physician (Gastroenterology) Volanda Napoleon, MD as Consulting Physician (Oncology) Ninetta Lights, MD (Inactive) as Consulting Physician (Orthopedic Surgery) Sydnee Levans, MD as Consulting Physician (Dermatology) Barbaraann Cao, OD as Referring Physician (Optometry)  Extended Emergency Contact Information Primary Emergency Contact: Gooding,Leslie Address: [address]          [place], [place] Phone: [phone] Relation: Daughter Secondary Emergency Contact: Diedre, [place] Mobile Phone: [phone] Relation: Son  Code Status:  DNR Goals of care: Advanced Directive information Advanced Directives 11/11/2020  Does Patient Have a Medical Advance Directive? Yes  Type of Paramedic of [place];Living will  Does patient want to make changes to medical advance directive? No - Patient declined  Copy of [place] in Chart? Yes - validated most recent copy scanned in chart (See row information)  Pre-existing out of facility DNR order (yellow form or pink MOST form) -     Chief Complaint  Patient presents with  . Acute Visit    Constipation     HPI:  Pt is a 87 y.o. female seen today for an acute visit for constipation.  Cindy Cervantes had increased abdominal pain in her left lower quadrant with a hard palpable area.  She has not felt too well.  Her nurse gave her a "mom bomb" and she had a significant bm prior to the KUB even  being done.  She has had another BM since and feels quite a bit better.  She does continue to have a firm area in her LLQ that is a bit tender and the chronic fairly superficial area on her right side that was felt to be an abnormality of her oblique muscle with prior imaging.  The KUB also showed considerable stool burden had remained, but, again, she's gone more since after dulcolax.  She has not lost weight.  She's had no blood in stool or dark stool.  Past Medical History:  Diagnosis Date  . Allergic rhinitis   . Anemia of renal disease 10/21/2011  . Anemia, iron deficiency 10/21/2011  . Cholelithiasis   . Chronic diastolic heart failure ([place])   . Colon polyp   . Constipation   . Cough    2nd to reflux  . Debility, unspecified   . Dehydration   . Depression   . Diaphragm dysfunction    Right hemidiaphragm  . Disorder of bone and cartilage, unspecified   . Diverticulosis of colon   . Dysphagia   . GERD (gastroesophageal reflux disease) 02/08/2013  . History of hyperkalemia in setting of spironolactone 09/2010  . Hyperlipidemia   . Hypertension   . Hypopotassemia   . Hypothyroidism 02/08/2013  . Long term (current) use of anticoagulants   . MDS (myelodysplastic syndrome), low grade ([place]) 10/21/2011  . Obesity   . Obesity hypoventilation syndrome ([place])   . OSA (obstructive sleep apnea)   . Osteoarthrosis, unspecified whether generalized or localized, unspecified site   . Other specified disease of white blood cells   . Pacemaker    Implanted  December 2012  . Paroxysmal A-fib (HCC) with RVR   Amiodarone  . Reflux esophagitis   . Rosacea 05/15/2013  . Senile cataract   . Sinus node dysfunction (HCC)   . Tracheobronchomalacia   . Unspecified vitamin D deficiency   . Urinary incontinence   . Xerostomia    Past Surgical History:  Procedure Laterality Date  . CARDIOVERSION N/A 12/17/2019   Procedure: CARDIOVERSION;  Surgeon: Skeet Latch, MD;  Location: [facility];   Service: Cardiovascular;  Laterality: N/A;  . CATARACT EXTRACTION  2008  . PACEMAKER PLACEMENT  10/21/2010   STJ, dural chamber Dr. [name]  . TOTAL KNEE ARTHROPLASTY  1999   right knee  . TOTAL KNEE ARTHROPLASTY  2003   left knee  . TUBAL LIGATION  1964  . Tummy tuck  2000   pt "almost died"; respiratory distress, became delrious    Allergies  Allergen Reactions  . Codeine     Extreme lethargy  . Iron     By infusion - back spasms   . Morphine     Hallucinations, altered mental state  . Toviaz [Fesoterodine Fumarate]     hyperactive    Outpatient Encounter Medications as of 11/04/2020  Medication Sig  . acetaminophen (TYLENOL) 325 MG tablet Take 325-650 mg by mouth every 4 (four) hours as needed for moderate pain.  Marland Kitchen allopurinol (ZYLOPRIM) 100 MG tablet Take 100 mg by mouth daily.  . bisacodyl (DULCOLAX) 10 MG suppository Place 10 mg rectally as needed for moderate constipation.  Marland Kitchen dextromethorphan-guaiFENesin (MUCINEX DM) 30-600 MG 12hr tablet Take 1 tablet by mouth 2 (two) times daily as needed (cough / congestion).  . ezetimibe (ZETIA) 10 MG tablet Take 10 mg by mouth every evening.   . fluticasone (FLONASE) 50 MCG/ACT nasal spray Place 2 sprays into the nose 2 (two) times daily. 2 sprays in each nostril  . levothyroxine (SYNTHROID, LEVOTHROID) 75 MCG tablet Take 75 mcg by mouth daily before breakfast.   . metoprolol succinate (TOPROL XL) 25 MG 24 hr tablet Take 1 tablet (25 mg total) by mouth at bedtime.  . polyethylene glycol powder (GLYCOLAX/MIRALAX) 17 GM/SCOOP powder Take 17 g by mouth as needed for mild constipation.   . potassium chloride SA (K-DUR,KLOR-CON) 20 MEQ tablet Take 40 mEq by mouth daily.  . Rivaroxaban (XARELTO) 15 MG TABS tablet Take 1 tablet (15 mg total) by mouth daily with supper.  . sertraline (ZOLOFT) 50 MG tablet Take 1 tablet (50 mg total) by mouth daily.  Marland Kitchen torsemide (DEMADEX) 20 MG tablet Take 20 mg by mouth daily.   No facility-administered  encounter medications on file as of 11/04/2020.    Review of Systems  Constitutional: Negative for chills and fever.  HENT: Positive for hearing loss. Negative for congestion and sore throat.   Respiratory: Positive for shortness of breath. Negative for cough.   Cardiovascular: Negative for chest pain, palpitations and leg swelling.  Gastrointestinal: Positive for abdominal pain and constipation. Negative for blood in stool, diarrhea and melena.  Genitourinary: Negative for dysuria.  Musculoskeletal: Negative for falls and joint pain.  Neurological: Negative for dizziness and loss of consciousness.  Endo/Heme/Allergies: Bruises/bleeds easily.  Psychiatric/Behavioral: Negative for depression. The patient is not nervous/anxious.        Memory not what it used to be (but no signs of dementia)    Immunization History  Administered Date(s) Administered  . Influenza Inj Mdck Quad Pf 07/21/2016  . Influenza Split 07/08/2011  . Influenza Whole 07/06/2009,  07/03/2012, 07/16/2013  . Influenza, High Dose Seasonal PF 08/01/2019, 07/24/2020  . Influenza,inj,Quad PF,6+ Mos 07/24/2018  . Influenza-Unspecified 07/08/2014, 07/16/2015, 07/27/2017  . Moderna Sars-Covid-2 Vaccination 10/14/2019, 11/12/2019  . PFIZER(Purple Top)SARS-COV-2 Vaccination 08/17/2020  . PPD Test 11/02/2011  . Pneumococcal Conjugate-13 01/13/2015  . Pneumococcal Polysaccharide-23 10/27/2010  . Tdap 07/08/2015  . Zoster Recombinat (Shingrix) 10/11/2017, 01/05/2018   Pertinent  Health Maintenance Due  Topic Date Due  . INFLUENZA VACCINE  Completed  . DEXA SCAN  Completed  . PNA vac Low Risk Adult  Completed   Fall Risk  11/11/2020 10/08/2020 09/30/2020 04/08/2020 10/16/2019  Falls in the past year? 0 0 0 0 0  Comment - - - - -  Number falls in past yr: 0 0 0 0 0  Injury with Fall? 0 0 0 0 0  Risk for fall due to : - - - - -  Follow up - - - - -   Functional Status Survey:    Vitals:   11/04/20 1425  BP: 137/76  Pulse:  62  Temp: (!) 97.5 F (36.4 C)  SpO2: 94%  Weight: 207 lb (93.9 kg)  Height: 5\' 4"  (1.626 m)   Body mass index is 35.53 kg/m. Physical Exam Vitals reviewed.  Constitutional:      Appearance: Normal appearance. She is obese.     Comments: Pleasant lady sitting in recliner in her room resting  HENT:     Head: Normocephalic and atraumatic.  Cardiovascular:     Rate and Rhythm: Normal rate and regular rhythm.  Pulmonary:     Effort: Pulmonary effort is normal.     Breath sounds: Normal breath sounds. No rhonchi or rales.  Abdominal:     General: Bowel sounds are normal. There is no distension.     Palpations: Abdomen is soft. There is mass.     Tenderness: There is abdominal tenderness. There is no guarding or rebound.     Comments: Firm area LLQ (?stool in turn of bowel) and firm area R side superficially (previously favored to be muscle abnormality)  Musculoskeletal:        General: Normal range of motion.     Right lower leg: No edema.     Left lower leg: No edema.  Neurological:     General: No focal deficit present.     Mental Status: She is alert and oriented to person, place, and time.  Psychiatric:        Mood and Affect: Mood normal.     Labs reviewed: Recent Labs    12/09/19 1009 09/23/20 1454 10/05/20 0000  NA 142 147* 144  K 4.3 4.7 4.7  CL 107 103 106  CO2 25 31* 29*  GLUCOSE 122* 99  --   BUN 18 22 18   CREATININE 1.98* 1.77* 1.5*  CALCIUM 8.8* 9.4 8.8   Recent Labs    11/29/19 0300 12/09/19 1009 10/05/20 0000  AST  --  16  --   ALT  --  13  --   ALKPHOS  --  82  --   BILITOT  --  0.7  --   PROT  --  6.7  --   ALBUMIN 3.7 3.6 3.4*   Recent Labs    12/09/19 1009 10/05/20 0000  WBC 8.1 7.3  HGB 12.8 11.4*  HCT 41.9 34*  MCV 102.9*  --   PLT 195 158   Lab Results  Component Value Date   TSH 4.09 11/29/2019  Lab Results  Component Value Date   HGBA1C 5.5 09/06/2016   Lab Results  Component Value Date   CHOL 168 10/05/2020    HDL 46 10/05/2020   LDLCALC 105 10/05/2020   TRIG 86 10/05/2020   CHOLHDL 2.2 09/08/2010    Significant Diagnostic Results in last 30 days:  No results found.  Assessment/Plan 1. Slow transit constipation -she opts not to add anything further to her regimen until she sees GI -she has now moved her bowels and feels quite a bit better  2. Abdominal mass, LLQ (left lower quadrant) -suspect stool in turn of bowel (has redundant bowel noted on CT in past) -f/u with GI as requested -she would not want surgical intervention or certainly chemo if she had a cancer at 29 and with her cardiopulmonary disease and MDS  3. Muscle mass -in right oblique muscle felt to be hard area on her right side previously noted  Family/ staff Communication: d/w AL nurse  Labs/tests ordered:  GI f/u at her request  [place]. Kitiara Hintze, D.O. [facility] [address] [place], [place] [address] Cell Phone (Mon-Fri 8am-5pm):  [phone] On Call:  [phone] & follow prompts after 5pm & weekends Office Phone:  [phone] Office Fax:  [phone]

## 2020-11-11 ENCOUNTER — Non-Acute Institutional Stay: Payer: Medicare Other | Admitting: Internal Medicine

## 2020-11-11 ENCOUNTER — Encounter: Payer: Self-pay | Admitting: Internal Medicine

## 2020-11-11 ENCOUNTER — Other Ambulatory Visit: Payer: Self-pay

## 2020-11-11 VITALS — BP 132/74 | HR 86 | Temp 97.9°F | Ht 64.0 in | Wt 206.6 lb

## 2020-11-11 DIAGNOSIS — I480 Paroxysmal atrial fibrillation: Secondary | ICD-10-CM | POA: Diagnosis not present

## 2020-11-11 DIAGNOSIS — G4733 Obstructive sleep apnea (adult) (pediatric): Secondary | ICD-10-CM

## 2020-11-11 DIAGNOSIS — D6869 Other thrombophilia: Secondary | ICD-10-CM

## 2020-11-11 DIAGNOSIS — K5901 Slow transit constipation: Secondary | ICD-10-CM

## 2020-11-11 DIAGNOSIS — I4891 Unspecified atrial fibrillation: Secondary | ICD-10-CM

## 2020-11-11 DIAGNOSIS — F321 Major depressive disorder, single episode, moderate: Secondary | ICD-10-CM

## 2020-11-11 DIAGNOSIS — I5032 Chronic diastolic (congestive) heart failure: Secondary | ICD-10-CM | POA: Diagnosis not present

## 2020-11-11 DIAGNOSIS — R1904 Left lower quadrant abdominal swelling, mass and lump: Secondary | ICD-10-CM | POA: Diagnosis not present

## 2020-11-11 NOTE — Progress Notes (Signed)
Location:  Occupational psychologist of Service:  Clinic (12)  Provider: Zaiyah Sottile L. [name], D.O., C.M.D.  Code Status: DNR Goals of Care:  Advanced Directives 10/08/2020  Does Patient Have a Medical Advance Directive? Yes  Type of Paramedic of [place];Living will  Does patient want to make changes to medical advance directive? No - Patient declined  Copy of [place] in Chart? Yes - validated most recent copy scanned in chart (See row information)  Pre-existing out of facility DNR order (yellow form or pink MOST form) -     Chief Complaint  Patient presents with  . Medical Management of Chronic Issues    6 week follow up on Depression     HPI: Patient is a 87 y.o. C[place] seen today for medical management of chronic diseases.    Has her morning congestion she works through.  Bowels did move yesterday and she had pain on the left side where she's had the one hard area.    She has an appt with Dr. Candis Shine at [place] (saw [facility] before).  She could not go to [employer] b/c she'd been somewhere before.   Mood is alright.  She was down for no particular reason at the end of December.  She thinks being stuck here and not being able to get together with her family at the holidays.  Her daughter brought something Monday.  She made a board with hooks for each month on the bottom of the frame with birthdays and anniversaries hanging from them.    Asks about linzess--we discussed but she wants to wait until after her GI appt anyway.  Past Medical History:  Diagnosis Date  . Allergic rhinitis   . Anemia of renal disease 10/21/2011  . Anemia, iron deficiency 10/21/2011  . Cholelithiasis   . Chronic diastolic heart failure ([place])   . Colon polyp   . Constipation   . Cough    2nd to reflux  . Debility, unspecified   . Dehydration   . Depression   . Diaphragm dysfunction    Right hemidiaphragm  . Disorder of bone and cartilage,  unspecified   . Diverticulosis of colon   . Dysphagia   . GERD (gastroesophageal reflux disease) 02/08/2013  . History of hyperkalemia in setting of spironolactone 09/2010  . Hyperlipidemia   . Hypertension   . Hypopotassemia   . Hypothyroidism 02/08/2013  . Long term (current) use of anticoagulants   . MDS (myelodysplastic syndrome), low grade ([place]) 10/21/2011  . Obesity   . Obesity hypoventilation syndrome ([place])   . OSA (obstructive sleep apnea)   . Osteoarthrosis, unspecified whether generalized or localized, unspecified site   . Other specified disease of white blood cells   . Pacemaker    Implanted December 2012  . Paroxysmal A-fib (HCC) with RVR   Amiodarone  . Reflux esophagitis   . Rosacea 05/15/2013  . Senile cataract   . Sinus node dysfunction (HCC)   . Tracheobronchomalacia   . Unspecified vitamin D deficiency   . Urinary incontinence   . Xerostomia     Past Surgical History:  Procedure Laterality Date  . CARDIOVERSION N/A 12/17/2019   Procedure: CARDIOVERSION;  Surgeon: Skeet Latch, MD;  Location: [facility];  Service: Cardiovascular;  Laterality: N/A;  . CATARACT EXTRACTION  2008  . PACEMAKER PLACEMENT  10/21/2010   STJ, dural chamber Dr. [name]  . TOTAL KNEE ARTHROPLASTY  1999   right knee  .  TOTAL KNEE ARTHROPLASTY  2003   left knee  . TUBAL LIGATION  1964  . Tummy tuck  2000   pt "almost died"; respiratory distress, became delrious    Allergies  Allergen Reactions  . Codeine     Extreme lethargy  . Iron     By infusion - back spasms   . Morphine     Hallucinations, altered mental state  . Toviaz [Fesoterodine Fumarate]     hyperactive    Outpatient Encounter Medications as of 11/11/2020  Medication Sig  . acetaminophen (TYLENOL) 325 MG tablet Take 325-650 mg by mouth every 4 (four) hours as needed for moderate pain.  Marland Kitchen allopurinol (ZYLOPRIM) 100 MG tablet Take 100 mg by mouth daily.  . bisacodyl (DULCOLAX) 10 MG suppository Place 10 mg  rectally as needed for moderate constipation.  Marland Kitchen dextromethorphan-guaiFENesin (MUCINEX DM) 30-600 MG 12hr tablet Take 1 tablet by mouth 2 (two) times daily as needed (cough / congestion).  . ezetimibe (ZETIA) 10 MG tablet Take 10 mg by mouth every evening.   . fluticasone (FLONASE) 50 MCG/ACT nasal spray Place 2 sprays into the nose 2 (two) times daily. 2 sprays in each nostril  . levothyroxine (SYNTHROID, LEVOTHROID) 75 MCG tablet Take 75 mcg by mouth daily before breakfast.   . metoprolol succinate (TOPROL XL) 25 MG 24 hr tablet Take 1 tablet (25 mg total) by mouth at bedtime.  . polyethylene glycol powder (GLYCOLAX/MIRALAX) 17 GM/SCOOP powder Take 17 g by mouth as needed for mild constipation.   . potassium chloride SA (K-DUR,KLOR-CON) 20 MEQ tablet Take 40 mEq by mouth daily.  . Rivaroxaban (XARELTO) 15 MG TABS tablet Take 1 tablet (15 mg total) by mouth daily with supper.  . sertraline (ZOLOFT) 50 MG tablet Take 1 tablet (50 mg total) by mouth daily.  Marland Kitchen torsemide (DEMADEX) 20 MG tablet Take 20 mg by mouth daily.   No facility-administered encounter medications on file as of 11/11/2020.    Review of Systems:  Review of Systems  Constitutional: Negative for chills and fever.  HENT: Negative for congestion and sore throat.   Eyes: Negative for blurred vision.  Respiratory: Negative for cough and shortness of breath.        Dyspnea on exertion  Cardiovascular: Negative for chest pain, palpitations and leg swelling.  Gastrointestinal: Positive for abdominal pain and constipation. Negative for blood in stool, diarrhea, heartburn and melena.  Genitourinary: Negative for dysuria.  Musculoskeletal: Negative for back pain, falls and joint pain.       Uses scooter long distances, walker in apt  Skin: Negative for rash.  Neurological: Negative for dizziness and loss of consciousness.  Endo/Heme/Allergies: Bruises/bleeds easily.  Psychiatric/Behavioral: Negative for depression and memory loss.  The patient is not nervous/anxious and does not have insomnia.        Spirits improved    Health Maintenance  Topic Date Due  . COVID-19 Vaccine (4 - Booster) 02/14/2021  . TETANUS/TDAP  07/07/2025  . INFLUENZA VACCINE  Completed  . DEXA SCAN  Completed  . PNA vac Low Risk Adult  Completed    Physical Exam: Vitals:   11/11/20 0933  BP: 132/74  Pulse: 86  Temp: 97.9 F (36.6 C)  TempSrc: Temporal  SpO2: 93%  Weight: 206 lb 9.6 oz (93.7 kg)  Height: 5\' 4"  (1.626 m)   Body mass index is 35.46 kg/m. Physical Exam Vitals reviewed.  Constitutional:      Appearance: Normal appearance.  HENT:  Head: Normocephalic and atraumatic.  Cardiovascular:     Rate and Rhythm: Rhythm irregular.     Heart sounds: No murmur heard.   Pulmonary:     Effort: Pulmonary effort is normal.     Breath sounds: Normal breath sounds. No wheezing, rhonchi or rales.  Abdominal:     General: Bowel sounds are normal.     Palpations: Abdomen is soft.     Tenderness: There is abdominal tenderness. There is no guarding or rebound.     Comments: Slight left lower quadrant tenderness with firm area palpable   Musculoskeletal:        General: Normal range of motion.     Cervical back: Neck supple.     Right lower leg: No edema.     Left lower leg: No edema.  Lymphadenopathy:     Cervical: No cervical adenopathy.  Neurological:     General: No focal deficit present.     Mental Status: She is alert and oriented to person, place, and time. Mental status is at baseline.     Comments: Came in scooter  Psychiatric:        Mood and Affect: Mood normal.        Behavior: Behavior normal.     Comments: Very pleasant, talkative      Labs reviewed: Basic Metabolic Panel: Recent Labs    11/29/19 0300 12/09/19 1009 09/23/20 1454 10/05/20 0000  NA 145 142 147* 144  K 4.3 4.3 4.7 4.7  CL 106 107 103 106  CO2 29* 25 31* 29*  GLUCOSE  --  122* 99  --   BUN 25* 18 22 18   CREATININE 1.7* 1.98*  1.77* 1.5*  CALCIUM 8.8 8.8* 9.4 8.8  TSH 4.09  --   --   --    Liver Function Tests: Recent Labs    11/29/19 0300 12/09/19 1009 10/05/20 0000  AST  --  16  --   ALT  --  13  --   ALKPHOS  --  82  --   BILITOT  --  0.7  --   PROT  --  6.7  --   ALBUMIN 3.7 3.6 3.4*   No results for input(s): LIPASE, AMYLASE in the last 8760 hours. No results for input(s): AMMONIA in the last 8760 hours. CBC: Recent Labs    12/09/19 1009 10/05/20 0000  WBC 8.1 7.3  HGB 12.8 11.4*  HCT 41.9 34*  MCV 102.9*  --   PLT 195 158   Lipid Panel: Recent Labs    10/05/20 0000  CHOL 168  HDL 46  LDLCALC 105  TRIG 86   Lab Results  Component Value Date   HGBA1C 5.5 09/06/2016    Procedures since last visit: No results found.  Assessment/Plan 1. Slow transit constipation -uses dulcolax supp and miralax as needed and typically does not have a regular problem but this is the second occurrence I recall where she's had difficulty and required multiple meds to have a bm  2. Abdominal mass, LLQ (left lower quadrant) -only thing seen on kub was stool throughout colon though she'd just had a bm at that point -prior CT shows some redundancy of bowel and a thickened area on muscle on her right side (still there on exam) -keep GI f/u  3. Depression, major, single episode, moderate ([place]) -improved with zoloft 50mg  and family visit finally for christmas  4. Paroxysmal atrial fibrillation (HCC) -rate controlled on toprol, had been on amiodarone at  one point, but affecting lungs, so taken off  5. Hypercoagulable state due to atrial fibrillation (HCC) -cont xarelto NOAC  6. Chronic diastolic heart failure (HCC) -has some dyspnea on exertion, a portion due to deconditioning -cont lasix and potassium regimen -currently euvolemic  7. OSA treated with BiPAP -cont this at hs, c/o congestion early in the am, but it goes away later -does have warm humidity on this and her oxygen  Labs/tests  ordered:   Await GI visit Next appt:  6 mos med mgt  Ival Basquez L. Irvin Lizama, D.O. [facility] [address], [place] [address] Cell Phone (Mon-Fri 8am-5pm):  [phone] On Call:  [phone] & follow prompts after 5pm & weekends Office Phone:  [phone] Office Fax:  [phone]

## 2020-11-12 ENCOUNTER — Encounter: Payer: Self-pay | Admitting: Internal Medicine

## 2020-11-12 DIAGNOSIS — D508 Other iron deficiency anemias: Secondary | ICD-10-CM | POA: Diagnosis not present

## 2020-11-12 DIAGNOSIS — E870 Hyperosmolality and hypernatremia: Secondary | ICD-10-CM | POA: Diagnosis not present

## 2020-11-12 DIAGNOSIS — E785 Hyperlipidemia, unspecified: Secondary | ICD-10-CM | POA: Diagnosis not present

## 2020-11-12 DIAGNOSIS — I5032 Chronic diastolic (congestive) heart failure: Secondary | ICD-10-CM | POA: Diagnosis not present

## 2020-11-16 ENCOUNTER — Ambulatory Visit (INDEPENDENT_AMBULATORY_CARE_PROVIDER_SITE_OTHER): Payer: Medicare Other

## 2020-11-16 DIAGNOSIS — I4819 Other persistent atrial fibrillation: Secondary | ICD-10-CM

## 2020-11-16 DIAGNOSIS — I495 Sick sinus syndrome: Secondary | ICD-10-CM | POA: Diagnosis not present

## 2020-11-17 LAB — CUP PACEART REMOTE DEVICE CHECK
Battery Remaining Longevity: 10 mo
Battery Remaining Percentage: 8 %
Battery Voltage: 2.69 V
Brady Statistic AP VP Percent: 1 %
Brady Statistic AP VS Percent: 80 %
Brady Statistic AS VP Percent: 1 %
Brady Statistic AS VS Percent: 17 %
Brady Statistic RA Percent Paced: 76 %
Brady Statistic RV Percent Paced: 1 %
Date Time Interrogation Session: 20220214020715
Implantable Lead Implant Date: 20120119
Implantable Lead Implant Date: 20120119
Implantable Lead Location: 753859
Implantable Lead Location: 753860
Implantable Lead Model: 1948
Implantable Pulse Generator Implant Date: 20120119
Lead Channel Impedance Value: 410 Ohm
Lead Channel Impedance Value: 750 Ohm
Lead Channel Pacing Threshold Amplitude: 0.5 V
Lead Channel Pacing Threshold Amplitude: 1 V
Lead Channel Pacing Threshold Pulse Width: 0.5 ms
Lead Channel Pacing Threshold Pulse Width: 0.5 ms
Lead Channel Sensing Intrinsic Amplitude: 2.8 mV
Lead Channel Sensing Intrinsic Amplitude: 9.3 mV
Lead Channel Setting Pacing Amplitude: 1.5 V
Lead Channel Setting Pacing Amplitude: 2.5 V
Lead Channel Setting Pacing Pulse Width: 0.5 ms
Lead Channel Setting Sensing Sensitivity: 2 mV
Pulse Gen Model: 2210
Pulse Gen Serial Number: 7198677

## 2020-11-20 NOTE — Progress Notes (Signed)
Remote pacemaker transmission.   

## 2020-11-23 ENCOUNTER — Encounter: Payer: Self-pay | Admitting: Internal Medicine

## 2020-12-01 ENCOUNTER — Telehealth: Payer: Self-pay | Admitting: Internal Medicine

## 2020-12-01 NOTE — Telephone Encounter (Signed)
Spoke with pt and advised Dr [name] has not prescribed Nexlizet for pt.  Pt is currently prescribed Zetia and it is listed in her MAR as a historical provider so unsure who her prescriber is. Pt states she has checked with her other providers. Pt states she feels this is a mistake by insurance company and thanked Therapist, sports for the call.

## 2020-12-01 NOTE — Telephone Encounter (Signed)
Pt c/o medication issue:  1. Name of Medication: nexlizet 10 mg  2. How are you currently taking this medication (dosage and times per day)? 1 tablet daily  3. Are you having a reaction (difficulty breathing--STAT)? no  4. What is your medication issue? Patient states she was told by her insurance that they will not pay for the medication and that it will cost her $460. She would like to know if her medications were changed.

## 2020-12-08 ENCOUNTER — Telehealth: Payer: Self-pay

## 2020-12-08 NOTE — Telephone Encounter (Signed)
Called patient advise increased in monthly battery checks. Verbalized understanding. Advised to call if she has further questions or concerns.

## 2020-12-08 NOTE — Telephone Encounter (Signed)
-----   Message from Deboraha Sprang, MD sent at 12/05/2020 10:22 AM EST ----- Remote reviewed. This remote is abnormal for Battery approaching ERI

## 2021-01-15 ENCOUNTER — Ambulatory Visit (INDEPENDENT_AMBULATORY_CARE_PROVIDER_SITE_OTHER): Payer: Medicare Other

## 2021-01-15 DIAGNOSIS — I495 Sick sinus syndrome: Secondary | ICD-10-CM

## 2021-01-19 LAB — CUP PACEART REMOTE DEVICE CHECK
Battery Remaining Longevity: 8 mo
Battery Remaining Percentage: 6 %
Battery Voltage: 2.68 V
Brady Statistic AP VP Percent: 1 %
Brady Statistic AP VS Percent: 78 %
Brady Statistic AS VP Percent: 1 %
Brady Statistic AS VS Percent: 20 %
Brady Statistic RA Percent Paced: 74 %
Brady Statistic RV Percent Paced: 1 %
Date Time Interrogation Session: 20220415032819
Implantable Lead Implant Date: 20120119
Implantable Lead Implant Date: 20120119
Implantable Lead Location: 753859
Implantable Lead Location: 753860
Implantable Lead Model: 1948
Implantable Pulse Generator Implant Date: 20120119
Lead Channel Impedance Value: 410 Ohm
Lead Channel Impedance Value: 850 Ohm
Lead Channel Pacing Threshold Amplitude: 0.375 V
Lead Channel Pacing Threshold Amplitude: 1 V
Lead Channel Pacing Threshold Pulse Width: 0.5 ms
Lead Channel Pacing Threshold Pulse Width: 0.5 ms
Lead Channel Sensing Intrinsic Amplitude: 1.8 mV
Lead Channel Sensing Intrinsic Amplitude: 8.7 mV
Lead Channel Setting Pacing Amplitude: 1.375
Lead Channel Setting Pacing Amplitude: 2.5 V
Lead Channel Setting Pacing Pulse Width: 0.5 ms
Lead Channel Setting Sensing Sensitivity: 2 mV
Pulse Gen Model: 2210
Pulse Gen Serial Number: 7198677

## 2021-02-01 DIAGNOSIS — Z9849 Cataract extraction status, unspecified eye: Secondary | ICD-10-CM | POA: Diagnosis not present

## 2021-02-01 DIAGNOSIS — Z961 Presence of intraocular lens: Secondary | ICD-10-CM | POA: Diagnosis not present

## 2021-02-01 DIAGNOSIS — H35033 Hypertensive retinopathy, bilateral: Secondary | ICD-10-CM | POA: Diagnosis not present

## 2021-02-01 DIAGNOSIS — H524 Presbyopia: Secondary | ICD-10-CM | POA: Diagnosis not present

## 2021-02-01 DIAGNOSIS — H5203 Hypermetropia, bilateral: Secondary | ICD-10-CM | POA: Diagnosis not present

## 2021-02-01 DIAGNOSIS — H52222 Regular astigmatism, left eye: Secondary | ICD-10-CM | POA: Diagnosis not present

## 2021-02-01 DIAGNOSIS — I10 Essential (primary) hypertension: Secondary | ICD-10-CM | POA: Diagnosis not present

## 2021-02-02 NOTE — Progress Notes (Signed)
Remote pacemaker transmission.   

## 2021-02-02 NOTE — Addendum Note (Signed)
Addended by: Cheri Kearns A on: 02/02/2021 08:29 AM   Modules accepted: Level of Service

## 2021-02-15 ENCOUNTER — Ambulatory Visit (INDEPENDENT_AMBULATORY_CARE_PROVIDER_SITE_OTHER): Payer: Medicare Other

## 2021-02-15 DIAGNOSIS — I495 Sick sinus syndrome: Secondary | ICD-10-CM

## 2021-02-16 LAB — CUP PACEART REMOTE DEVICE CHECK
Battery Remaining Longevity: 4 mo
Battery Remaining Percentage: 3 %
Battery Voltage: 2.65 V
Brady Statistic AP VP Percent: 1 %
Brady Statistic AP VS Percent: 78 %
Brady Statistic AS VP Percent: 1 %
Brady Statistic AS VS Percent: 20 %
Brady Statistic RA Percent Paced: 75 %
Brady Statistic RV Percent Paced: 1 %
Date Time Interrogation Session: 20220517100702
Implantable Lead Implant Date: 20120119
Implantable Lead Implant Date: 20120119
Implantable Lead Location: 753859
Implantable Lead Location: 753860
Implantable Lead Model: 1948
Implantable Pulse Generator Implant Date: 20120119
Lead Channel Impedance Value: 410 Ohm
Lead Channel Impedance Value: 750 Ohm
Lead Channel Pacing Threshold Amplitude: 0.375 V
Lead Channel Pacing Threshold Amplitude: 1 V
Lead Channel Pacing Threshold Pulse Width: 0.5 ms
Lead Channel Pacing Threshold Pulse Width: 0.5 ms
Lead Channel Sensing Intrinsic Amplitude: 1.4 mV
Lead Channel Sensing Intrinsic Amplitude: 9.6 mV
Lead Channel Setting Pacing Amplitude: 1.375
Lead Channel Setting Pacing Amplitude: 2.5 V
Lead Channel Setting Pacing Pulse Width: 0.5 ms
Lead Channel Setting Sensing Sensitivity: 2 mV
Pulse Gen Model: 2210
Pulse Gen Serial Number: 7198677

## 2021-02-23 ENCOUNTER — Ambulatory Visit
Admission: RE | Admit: 2021-02-23 | Discharge: 2021-02-23 | Disposition: A | Discharge status: Home / Self Care | Payer: Medicare Other | Source: Ambulatory Visit | Attending: Nurse Practitioner | Admitting: Nurse Practitioner

## 2021-02-23 ENCOUNTER — Other Ambulatory Visit: Payer: Self-pay

## 2021-02-23 DIAGNOSIS — Z780 Asymptomatic menopausal state: Secondary | ICD-10-CM | POA: Diagnosis not present

## 2021-02-23 DIAGNOSIS — E2839 Other primary ovarian failure: Secondary | ICD-10-CM

## 2021-02-23 DIAGNOSIS — M85851 Other specified disorders of bone density and structure, right thigh: Secondary | ICD-10-CM | POA: Diagnosis not present

## 2021-02-28 ENCOUNTER — Telehealth: Payer: Self-pay | Admitting: Adult Health

## 2021-02-28 NOTE — Telephone Encounter (Signed)
Nurse called to report that this resident has a cough and sputum production. Lungs sounds have wheezing and rhonchi. Rapid covid negative. Vitals are WNL per nurse. Pt is on oxygen 2 liters prn chronically with a hx of COPD. CXR and duonebs q 6 prn ordered. Rapid covid performed and was negative.

## 2021-03-01 ENCOUNTER — Encounter: Payer: Self-pay | Admitting: Internal Medicine

## 2021-03-01 DIAGNOSIS — R059 Cough, unspecified: Secondary | ICD-10-CM | POA: Diagnosis not present

## 2021-03-10 NOTE — Progress Notes (Signed)
Remote pacemaker transmission.   

## 2021-03-18 ENCOUNTER — Ambulatory Visit (INDEPENDENT_AMBULATORY_CARE_PROVIDER_SITE_OTHER): Payer: Medicare Other

## 2021-03-18 DIAGNOSIS — I495 Sick sinus syndrome: Secondary | ICD-10-CM

## 2021-03-19 LAB — CUP PACEART REMOTE DEVICE CHECK
Battery Remaining Longevity: 1 mo
Battery Remaining Percentage: 1 %
Battery Voltage: 2.63 V
Brady Statistic AP VP Percent: 1 %
Brady Statistic AP VS Percent: 78 %
Brady Statistic AS VP Percent: 1 %
Brady Statistic AS VS Percent: 20 %
Brady Statistic RA Percent Paced: 71 %
Brady Statistic RV Percent Paced: 1 %
Date Time Interrogation Session: 20220616195315
Implantable Lead Implant Date: 20120119
Implantable Lead Implant Date: 20120119
Implantable Lead Location: 753859
Implantable Lead Location: 753860
Implantable Lead Model: 1948
Implantable Pulse Generator Implant Date: 20120119
Lead Channel Impedance Value: 410 Ohm
Lead Channel Impedance Value: 740 Ohm
Lead Channel Pacing Threshold Amplitude: 0.5 V
Lead Channel Pacing Threshold Amplitude: 1 V
Lead Channel Pacing Threshold Pulse Width: 0.5 ms
Lead Channel Pacing Threshold Pulse Width: 0.5 ms
Lead Channel Sensing Intrinsic Amplitude: 10 mV
Lead Channel Sensing Intrinsic Amplitude: 2.7 mV
Lead Channel Setting Pacing Amplitude: 1.5 V
Lead Channel Setting Pacing Amplitude: 2.5 V
Lead Channel Setting Pacing Pulse Width: 0.5 ms
Lead Channel Setting Sensing Sensitivity: 2 mV
Pulse Gen Model: 2210
Pulse Gen Serial Number: 7198677

## 2021-03-30 NOTE — Progress Notes (Signed)
Monthly battery checks are scheduled.

## 2021-04-08 NOTE — Addendum Note (Signed)
Addended by: Cheri Kearns A on: 04/08/2021 02:27 PM   Modules accepted: Level of Service

## 2021-04-08 NOTE — Progress Notes (Signed)
Remote pacemaker transmission.   

## 2021-04-19 ENCOUNTER — Telehealth: Payer: Self-pay | Admitting: Internal Medicine

## 2021-04-19 DIAGNOSIS — R0902 Hypoxemia: Secondary | ICD-10-CM | POA: Diagnosis not present

## 2021-04-19 DIAGNOSIS — R000 Tachycardia, unspecified: Secondary | ICD-10-CM | POA: Diagnosis not present

## 2021-04-19 DIAGNOSIS — R079 Chest pain, unspecified: Secondary | ICD-10-CM | POA: Diagnosis not present

## 2021-04-19 DIAGNOSIS — I10 Essential (primary) hypertension: Secondary | ICD-10-CM | POA: Diagnosis not present

## 2021-04-19 DIAGNOSIS — R0789 Other chest pain: Secondary | ICD-10-CM | POA: Diagnosis not present

## 2021-04-19 NOTE — Telephone Encounter (Signed)
Pt's daughter called to report pt c/o not feeling well over past several weeks. She's had no energy, and fatigue and some short of breath, wearing O2 continuously right now vs just prn/activities. C/o chest pressure "heavy" last week.    She lives at [facility]. Yesterday: 126/83  [id number]. Today just sitting around her hr is 120.  I have scheduled her at [facility] tomorrow afternoon.  I called the pt and let her know. Adv her that if her hr continues to be elevated she may need to be evaluated at the ER instead of an appointment tomorrow.  She will go if she feels worse.  She has a productive cough for yellow sputum which is not new.    She does not have a nebulizer or inhaler. Has duonebs on her med list.   Her daughter is going to speak with the nurse to make sure the facility is aware of her appointment.

## 2021-04-19 NOTE — Telephone Encounter (Signed)
STAT if HR is under 50 or over 120 (normal HR is 60-100 beats per minute)  What is your heart rate? 142 today  Do you have a log of your heart rate readings (document readings)? lives at [facility] so does not have access to all of her readings right now  Do you have any other symptoms? Fatigue, feeling the worse in the mornings.  Patient scheduled for 08/08 with Barrington Ellison

## 2021-04-19 NOTE — Telephone Encounter (Signed)
Noted and thanks.

## 2021-04-20 ENCOUNTER — Encounter (HOSPITAL_COMMUNITY): Payer: Self-pay

## 2021-04-20 ENCOUNTER — Ambulatory Visit (HOSPITAL_COMMUNITY): Payer: Medicare Other | Admitting: Physician Assistant

## 2021-04-20 ENCOUNTER — Emergency Department (HOSPITAL_COMMUNITY): Payer: Medicare Other

## 2021-04-20 ENCOUNTER — Inpatient Hospital Stay (HOSPITAL_COMMUNITY)
Admission: EM | Admit: 2021-04-20 | Discharge: 2021-04-26 | DRG: 308 | Disposition: A | Discharge status: Nursing Home (Includes ICF) | Payer: Medicare Other | Source: Skilled Nursing Facility | Attending: Internal Medicine | Admitting: Internal Medicine

## 2021-04-20 ENCOUNTER — Other Ambulatory Visit: Payer: Self-pay

## 2021-04-20 ENCOUNTER — Ambulatory Visit (INDEPENDENT_AMBULATORY_CARE_PROVIDER_SITE_OTHER): Payer: Medicare Other

## 2021-04-20 ENCOUNTER — Inpatient Hospital Stay (HOSPITAL_COMMUNITY): Payer: Medicare Other

## 2021-04-20 DIAGNOSIS — I4891 Unspecified atrial fibrillation: Secondary | ICD-10-CM

## 2021-04-20 DIAGNOSIS — Z9989 Dependence on other enabling machines and devices: Secondary | ICD-10-CM | POA: Diagnosis not present

## 2021-04-20 DIAGNOSIS — G4733 Obstructive sleep apnea (adult) (pediatric): Secondary | ICD-10-CM | POA: Diagnosis not present

## 2021-04-20 DIAGNOSIS — Z86010 Personal history of colonic polyps: Secondary | ICD-10-CM

## 2021-04-20 DIAGNOSIS — I495 Sick sinus syndrome: Secondary | ICD-10-CM | POA: Diagnosis present

## 2021-04-20 DIAGNOSIS — I5033 Acute on chronic diastolic (congestive) heart failure: Secondary | ICD-10-CM | POA: Diagnosis present

## 2021-04-20 DIAGNOSIS — Z66 Do not resuscitate: Secondary | ICD-10-CM | POA: Diagnosis not present

## 2021-04-20 DIAGNOSIS — J9621 Acute and chronic respiratory failure with hypoxia: Secondary | ICD-10-CM | POA: Diagnosis not present

## 2021-04-20 DIAGNOSIS — E785 Hyperlipidemia, unspecified: Secondary | ICD-10-CM | POA: Diagnosis present

## 2021-04-20 DIAGNOSIS — J449 Chronic obstructive pulmonary disease, unspecified: Secondary | ICD-10-CM | POA: Diagnosis present

## 2021-04-20 DIAGNOSIS — Z7901 Long term (current) use of anticoagulants: Secondary | ICD-10-CM | POA: Diagnosis not present

## 2021-04-20 DIAGNOSIS — Z79890 Hormone replacement therapy (postmenopausal): Secondary | ICD-10-CM

## 2021-04-20 DIAGNOSIS — R443 Hallucinations, unspecified: Secondary | ICD-10-CM | POA: Diagnosis not present

## 2021-04-20 DIAGNOSIS — J9601 Acute respiratory failure with hypoxia: Secondary | ICD-10-CM | POA: Diagnosis present

## 2021-04-20 DIAGNOSIS — Z885 Allergy status to narcotic agent status: Secondary | ICD-10-CM | POA: Diagnosis not present

## 2021-04-20 DIAGNOSIS — Z820 Family history of epilepsy and other diseases of the nervous system: Secondary | ICD-10-CM

## 2021-04-20 DIAGNOSIS — I4819 Other persistent atrial fibrillation: Secondary | ICD-10-CM | POA: Diagnosis present

## 2021-04-20 DIAGNOSIS — E662 Morbid (severe) obesity with alveolar hypoventilation: Secondary | ICD-10-CM | POA: Diagnosis present

## 2021-04-20 DIAGNOSIS — Z6835 Body mass index (BMI) 35.0-35.9, adult: Secondary | ICD-10-CM | POA: Diagnosis not present

## 2021-04-20 DIAGNOSIS — Z6836 Body mass index (BMI) 36.0-36.9, adult: Secondary | ICD-10-CM

## 2021-04-20 DIAGNOSIS — Z79899 Other long term (current) drug therapy: Secondary | ICD-10-CM | POA: Diagnosis not present

## 2021-04-20 DIAGNOSIS — Z20822 Contact with and (suspected) exposure to covid-19: Secondary | ICD-10-CM | POA: Diagnosis present

## 2021-04-20 DIAGNOSIS — Z96653 Presence of artificial knee joint, bilateral: Secondary | ICD-10-CM | POA: Diagnosis present

## 2021-04-20 DIAGNOSIS — Z87891 Personal history of nicotine dependence: Secondary | ICD-10-CM

## 2021-04-20 DIAGNOSIS — Z888 Allergy status to other drugs, medicaments and biological substances status: Secondary | ICD-10-CM | POA: Diagnosis not present

## 2021-04-20 DIAGNOSIS — Z950 Presence of cardiac pacemaker: Secondary | ICD-10-CM | POA: Diagnosis not present

## 2021-04-20 DIAGNOSIS — I483 Typical atrial flutter: Secondary | ICD-10-CM | POA: Diagnosis not present

## 2021-04-20 DIAGNOSIS — R0600 Dyspnea, unspecified: Secondary | ICD-10-CM

## 2021-04-20 DIAGNOSIS — I4892 Unspecified atrial flutter: Principal | ICD-10-CM | POA: Diagnosis present

## 2021-04-20 DIAGNOSIS — E669 Obesity, unspecified: Secondary | ICD-10-CM | POA: Diagnosis present

## 2021-04-20 DIAGNOSIS — D509 Iron deficiency anemia, unspecified: Secondary | ICD-10-CM | POA: Diagnosis not present

## 2021-04-20 DIAGNOSIS — E039 Hypothyroidism, unspecified: Secondary | ICD-10-CM | POA: Diagnosis not present

## 2021-04-20 DIAGNOSIS — I130 Hypertensive heart and chronic kidney disease with heart failure and stage 1 through stage 4 chronic kidney disease, or unspecified chronic kidney disease: Secondary | ICD-10-CM | POA: Diagnosis not present

## 2021-04-20 DIAGNOSIS — Z8249 Family history of ischemic heart disease and other diseases of the circulatory system: Secondary | ICD-10-CM

## 2021-04-20 DIAGNOSIS — J9811 Atelectasis: Secondary | ICD-10-CM | POA: Diagnosis not present

## 2021-04-20 DIAGNOSIS — D631 Anemia in chronic kidney disease: Secondary | ICD-10-CM | POA: Diagnosis not present

## 2021-04-20 DIAGNOSIS — N1832 Chronic kidney disease, stage 3b: Secondary | ICD-10-CM | POA: Diagnosis not present

## 2021-04-20 DIAGNOSIS — J9611 Chronic respiratory failure with hypoxia: Secondary | ICD-10-CM | POA: Diagnosis present

## 2021-04-20 DIAGNOSIS — K5909 Other constipation: Secondary | ICD-10-CM | POA: Diagnosis not present

## 2021-04-20 DIAGNOSIS — R059 Cough, unspecified: Secondary | ICD-10-CM | POA: Diagnosis not present

## 2021-04-20 DIAGNOSIS — R079 Chest pain, unspecified: Secondary | ICD-10-CM | POA: Diagnosis not present

## 2021-04-20 DIAGNOSIS — I5032 Chronic diastolic (congestive) heart failure: Secondary | ICD-10-CM | POA: Diagnosis not present

## 2021-04-20 DIAGNOSIS — I4510 Unspecified right bundle-branch block: Secondary | ICD-10-CM | POA: Diagnosis present

## 2021-04-20 DIAGNOSIS — I082 Rheumatic disorders of both aortic and tricuspid valves: Secondary | ICD-10-CM | POA: Diagnosis present

## 2021-04-20 LAB — CUP PACEART REMOTE DEVICE CHECK
Battery Remaining Longevity: 1 mo
Battery Remaining Percentage: 1 %
Battery Voltage: 2.63 V
Brady Statistic AP VP Percent: 1 %
Brady Statistic AP VS Percent: 78 %
Brady Statistic AS VP Percent: 1 %
Brady Statistic AS VS Percent: 20 %
Brady Statistic RA Percent Paced: 61 %
Brady Statistic RV Percent Paced: 1 %
Date Time Interrogation Session: 20220719080934
Implantable Lead Implant Date: 20120119
Implantable Lead Implant Date: 20120119
Implantable Lead Location: 753859
Implantable Lead Location: 753860
Implantable Lead Model: 1948
Implantable Pulse Generator Implant Date: 20120119
Lead Channel Impedance Value: 360 Ohm
Lead Channel Impedance Value: 740 Ohm
Lead Channel Pacing Threshold Amplitude: 0.625 V
Lead Channel Pacing Threshold Amplitude: 1 V
Lead Channel Pacing Threshold Pulse Width: 0.5 ms
Lead Channel Pacing Threshold Pulse Width: 0.5 ms
Lead Channel Sensing Intrinsic Amplitude: 1.1 mV
Lead Channel Sensing Intrinsic Amplitude: 9.4 mV
Lead Channel Setting Pacing Amplitude: 1.625
Lead Channel Setting Pacing Amplitude: 2.5 V
Lead Channel Setting Pacing Pulse Width: 0.5 ms
Lead Channel Setting Sensing Sensitivity: 2 mV
Pulse Gen Model: 2210
Pulse Gen Serial Number: 7198677

## 2021-04-20 LAB — CBC WITH DIFFERENTIAL/PLATELET
Abs Immature Granulocytes: 0.02 10*3/uL (ref 0.00–0.07)
Basophils Absolute: 0 10*3/uL (ref 0.0–0.1)
Basophils Relative: 0 %
Eosinophils Absolute: 0.3 10*3/uL (ref 0.0–0.5)
Eosinophils Relative: 3 %
HCT: 35.5 % — ABNORMAL LOW (ref 36.0–46.0)
Hemoglobin: 10.5 g/dL — ABNORMAL LOW (ref 12.0–15.0)
Immature Granulocytes: 0 %
Lymphocytes Relative: 13 %
Lymphs Abs: 1.3 10*3/uL (ref 0.7–4.0)
MCH: 31.3 pg (ref 26.0–34.0)
MCHC: 29.6 g/dL — ABNORMAL LOW (ref 30.0–36.0)
MCV: 105.7 fL — ABNORMAL HIGH (ref 80.0–100.0)
Monocytes Absolute: 0.6 10*3/uL (ref 0.1–1.0)
Monocytes Relative: 7 %
Neutro Abs: 7.4 10*3/uL (ref 1.7–7.7)
Neutrophils Relative %: 77 %
Platelets: 188 10*3/uL (ref 150–400)
RBC: 3.36 MIL/uL — ABNORMAL LOW (ref 3.87–5.11)
RDW: 14 % (ref 11.5–15.5)
WBC: 9.6 10*3/uL (ref 4.0–10.5)
nRBC: 0 % (ref 0.0–0.2)

## 2021-04-20 LAB — I-STAT VENOUS BLOOD GAS, ED
Acid-Base Excess: 8 mmol/L — ABNORMAL HIGH (ref 0.0–2.0)
Bicarbonate: 34.6 mmol/L — ABNORMAL HIGH (ref 20.0–28.0)
Calcium, Ion: 1.05 mmol/L — ABNORMAL LOW (ref 1.15–1.40)
HCT: 33 % — ABNORMAL LOW (ref 36.0–46.0)
Hemoglobin: 11.2 g/dL — ABNORMAL LOW (ref 12.0–15.0)
O2 Saturation: 60 %
Potassium: 5 mmol/L (ref 3.5–5.1)
Sodium: 141 mmol/L (ref 135–145)
TCO2: 36 mmol/L — ABNORMAL HIGH (ref 22–32)
pCO2, Ven: 59.3 mmHg (ref 44.0–60.0)
pH, Ven: 7.375 (ref 7.250–7.430)
pO2, Ven: 33 mmHg (ref 32.0–45.0)

## 2021-04-20 LAB — BLOOD GAS, VENOUS
Acid-Base Excess: 8.3 mmol/L — ABNORMAL HIGH (ref 0.0–2.0)
Bicarbonate: 34.7 mmol/L — ABNORMAL HIGH (ref 20.0–28.0)
Drawn by: 5979
FIO2: 28
O2 Saturation: 69.7 %
Patient temperature: 37
pCO2, Ven: 73.6 mmHg (ref 44.0–60.0)
pH, Ven: 7.296 (ref 7.250–7.430)
pO2, Ven: 40.5 mmHg (ref 32.0–45.0)

## 2021-04-20 LAB — COMPREHENSIVE METABOLIC PANEL
ALT: 10 U/L (ref 0–44)
AST: 23 U/L (ref 15–41)
Albumin: 3 g/dL — ABNORMAL LOW (ref 3.5–5.0)
Alkaline Phosphatase: 80 U/L (ref 38–126)
Anion gap: 4 — ABNORMAL LOW (ref 5–15)
BUN: 20 mg/dL (ref 8–23)
CO2: 36 mmol/L — ABNORMAL HIGH (ref 22–32)
Calcium: 8.7 mg/dL — ABNORMAL LOW (ref 8.9–10.3)
Chloride: 101 mmol/L (ref 98–111)
Creatinine, Ser: 1.55 mg/dL — ABNORMAL HIGH (ref 0.44–1.00)
GFR, Estimated: 32 mL/min — ABNORMAL LOW (ref >60)
Glucose, Bld: 126 mg/dL — ABNORMAL HIGH (ref 70–99)
Potassium: 4.6 mmol/L (ref 3.5–5.1)
Sodium: 141 mmol/L (ref 135–145)
Total Bilirubin: 1 mg/dL (ref 0.3–1.2)
Total Protein: 6.1 g/dL — ABNORMAL LOW (ref 6.5–8.1)

## 2021-04-20 LAB — ECHOCARDIOGRAM COMPLETE
Area-P 1/2: 3.6 cm2
Height: 64 in
S' Lateral: 2.9 cm
Weight: 3305.14 oz

## 2021-04-20 LAB — SARS CORONAVIRUS 2 (TAT 6-24 HRS): SARS Coronavirus 2: NEGATIVE

## 2021-04-20 LAB — TROPONIN I (HIGH SENSITIVITY)
Troponin I (High Sensitivity): 11 ng/L (ref <18)
Troponin I (High Sensitivity): 12 ng/L (ref <18)

## 2021-04-20 LAB — TSH: TSH: 2.546 u[IU]/mL (ref 0.350–4.500)

## 2021-04-20 LAB — BRAIN NATRIURETIC PEPTIDE: B Natriuretic Peptide: 183.9 pg/mL — ABNORMAL HIGH (ref 0.0–100.0)

## 2021-04-20 LAB — MAGNESIUM: Magnesium: 2.2 mg/dL (ref 1.7–2.4)

## 2021-04-20 MED ORDER — HEPARIN SODIUM (PORCINE) 5000 UNIT/ML IJ SOLN
5000.0000 [IU] | Freq: Three times a day (TID) | INTRAMUSCULAR | Status: DC
  Administered 2021-04-20: 5000 [IU] via SUBCUTANEOUS
  Filled 2021-04-20: qty 1

## 2021-04-20 MED ORDER — ONDANSETRON HCL 4 MG PO TABS: 4.0000 mg | ORAL_TABLET | Freq: Four times a day (QID) | ORAL | Status: DC | PRN

## 2021-04-20 MED ORDER — ONDANSETRON HCL 4 MG/2ML IJ SOLN: 4.0000 mg | Freq: Four times a day (QID) | INTRAMUSCULAR | Status: DC | PRN

## 2021-04-20 MED ORDER — METOPROLOL SUCCINATE ER 25 MG PO TB24
25.0000 mg | ORAL_TABLET | Freq: Every day | ORAL | Status: DC
  Administered 2021-04-20 – 2021-04-25 (×6): 25 mg via ORAL
  Filled 2021-04-20 (×6): qty 1

## 2021-04-20 MED ORDER — DILTIAZEM HCL-DEXTROSE 125-5 MG/125ML-% IV SOLN (PREMIX)
5.0000 mg/h | INTRAVENOUS | Status: DC
Start: 2021-04-20 — End: 2021-04-21
  Administered 2021-04-20: 5 mg/h via INTRAVENOUS
  Administered 2021-04-20 – 2021-04-21 (×2): 10 mg/h via INTRAVENOUS
  Filled 2021-04-20 (×3): qty 125

## 2021-04-20 MED ORDER — ENOXAPARIN SODIUM 100 MG/ML IJ SOSY
90.0000 mg | PREFILLED_SYRINGE | INTRAMUSCULAR | Status: DC
  Filled 2021-04-20: qty 0.9

## 2021-04-20 MED ORDER — SODIUM CHLORIDE 0.9 % IV BOLUS
1000.0000 mL | Freq: Once | INTRAVENOUS | Status: AC
  Administered 2021-04-20: 1000 mL via INTRAVENOUS

## 2021-04-20 MED ORDER — FLUTICASONE PROPIONATE 50 MCG/ACT NA SUSP
2.0000 | Freq: Two times a day (BID) | NASAL | Status: DC
  Administered 2021-04-21 – 2021-04-25 (×7): 2 via NASAL
  Filled 2021-04-20: qty 16

## 2021-04-20 MED ORDER — SERTRALINE HCL 50 MG PO TABS
50.0000 mg | ORAL_TABLET | Freq: Every day | ORAL | Status: DC
  Administered 2021-04-20 – 2021-04-26 (×7): 50 mg via ORAL
  Filled 2021-04-20 (×7): qty 1

## 2021-04-20 MED ORDER — ACETAMINOPHEN 325 MG PO TABS: 650.0000 mg | ORAL_TABLET | Freq: Four times a day (QID) | ORAL | Status: DC | PRN

## 2021-04-20 MED ORDER — RIVAROXABAN 15 MG PO TABS: 15.0000 mg | ORAL_TABLET | Freq: Every day | ORAL | Status: DC

## 2021-04-20 MED ORDER — SODIUM CHLORIDE 0.9% FLUSH
3.0000 mL | Freq: Two times a day (BID) | INTRAVENOUS | Status: DC
  Administered 2021-04-20 – 2021-04-25 (×12): 3 mL via INTRAVENOUS

## 2021-04-20 MED ORDER — POTASSIUM CHLORIDE CRYS ER 20 MEQ PO TBCR
40.0000 meq | EXTENDED_RELEASE_TABLET | Freq: Every day | ORAL | Status: DC
  Administered 2021-04-20 – 2021-04-26 (×7): 40 meq via ORAL
  Filled 2021-04-20 (×7): qty 2

## 2021-04-20 MED ORDER — IPRATROPIUM-ALBUTEROL 0.5-2.5 (3) MG/3ML IN SOLN: 3.0000 mL | Freq: Four times a day (QID) | RESPIRATORY_TRACT | Status: DC | PRN

## 2021-04-20 MED ORDER — IOHEXOL 350 MG/ML SOLN
75.0000 mL | Freq: Once | INTRAVENOUS | Status: AC | PRN
  Administered 2021-04-20: 75 mL via INTRAVENOUS

## 2021-04-20 MED ORDER — EZETIMIBE 10 MG PO TABS
10.0000 mg | ORAL_TABLET | Freq: Every evening | ORAL | Status: DC
  Administered 2021-04-20 – 2021-04-25 (×6): 10 mg via ORAL
  Filled 2021-04-20 (×7): qty 1

## 2021-04-20 MED ORDER — POLYETHYLENE GLYCOL 3350 17 G PO PACK
17.0000 g | PACK | Freq: Every day | ORAL | Status: DC
  Administered 2021-04-20 – 2021-04-22 (×2): 17 g via ORAL
  Filled 2021-04-20 (×7): qty 1

## 2021-04-20 MED ORDER — LEVOTHYROXINE SODIUM 75 MCG PO TABS
75.0000 ug | ORAL_TABLET | Freq: Every day | ORAL | Status: DC
  Administered 2021-04-21 – 2021-04-25 (×5): 75 ug via ORAL
  Filled 2021-04-20 (×6): qty 1

## 2021-04-20 MED ORDER — GUAIFENESIN ER 600 MG PO TB12
600.0000 mg | ORAL_TABLET | Freq: Two times a day (BID) | ORAL | Status: DC
  Administered 2021-04-20 – 2021-04-26 (×13): 600 mg via ORAL
  Filled 2021-04-20 (×13): qty 1

## 2021-04-20 MED ORDER — ALBUTEROL SULFATE (2.5 MG/3ML) 0.083% IN NEBU
2.5000 mg | INHALATION_SOLUTION | RESPIRATORY_TRACT | Status: DC | PRN
Start: 2021-04-20 — End: 2021-04-20

## 2021-04-20 MED ORDER — FUROSEMIDE 10 MG/ML IJ SOLN
40.0000 mg | Freq: Once | INTRAMUSCULAR | Status: AC
  Administered 2021-04-20: 40 mg via INTRAVENOUS
  Filled 2021-04-20: qty 4

## 2021-04-20 MED ORDER — BISACODYL 10 MG RE SUPP: 10.0000 mg | RECTAL | Status: DC | PRN

## 2021-04-20 MED ORDER — METOPROLOL TARTRATE 5 MG/5ML IV SOLN
5.0000 mg | Freq: Once | INTRAVENOUS | Status: AC
  Administered 2021-04-20: 5 mg via INTRAVENOUS
  Filled 2021-04-20: qty 5

## 2021-04-20 MED ORDER — RIVAROXABAN 15 MG PO TABS
15.0000 mg | ORAL_TABLET | Freq: Every day | ORAL | Status: DC
  Administered 2021-04-20 – 2021-04-25 (×6): 15 mg via ORAL
  Filled 2021-04-20 (×7): qty 1

## 2021-04-20 MED ORDER — ACETAMINOPHEN 650 MG RE SUPP: 650.0000 mg | Freq: Four times a day (QID) | RECTAL | Status: DC | PRN

## 2021-04-20 MED ORDER — DILTIAZEM LOAD VIA INFUSION
15.0000 mg | Freq: Once | INTRAVENOUS | Status: AC
  Administered 2021-04-20: 15 mg via INTRAVENOUS
  Filled 2021-04-20: qty 15

## 2021-04-20 MED ORDER — TORSEMIDE 20 MG PO TABS
20.0000 mg | ORAL_TABLET | Freq: Every day | ORAL | Status: DC
  Administered 2021-04-20 – 2021-04-22 (×3): 20 mg via ORAL
  Filled 2021-04-20 (×3): qty 1

## 2021-04-20 MED ORDER — ALLOPURINOL 100 MG PO TABS
100.0000 mg | ORAL_TABLET | Freq: Every day | ORAL | Status: DC
  Administered 2021-04-20 – 2021-04-26 (×7): 100 mg via ORAL
  Filled 2021-04-20 (×7): qty 1

## 2021-04-20 NOTE — Progress Notes (Signed)
Echocardiogram 2D Echocardiogram has been performed.  Oneal Deputy Katurah Karapetian RDCS 04/20/2021, 1:24 PM

## 2021-04-20 NOTE — ED Notes (Signed)
Pt placed on purewick 

## 2021-04-20 NOTE — Progress Notes (Signed)
  Device remains at battery voltage 2.63 (2.60 will be ERI). Has 0-3 months likely until ERI. She has outpatient follow up schedule and is on Monthly Battery Checks.   Not indicated for pacer generator replacement at this time.   Legrand Como [address] [place], PA-C  04/20/2021 10:19 AM

## 2021-04-20 NOTE — ED Provider Notes (Signed)
Cindy Cervantes   CSN: [id number] Arrival date & time: 04/20/21  9675     History Chief Complaint  Patient presents with   Chest Pain    Cindy Cervantes is a 87 y.o. female.  Patient with a history of A. fib on metoprolol and Eliquis also hypertension, hyperlipidemia, heart failure multiple other medical problems documented below who presents to the Emergency Department today with multiple symptoms.  Over the last month or so she had progressively worsening dyspnea on exertion.  She states she had intermittent episodes of A. fib with RVR with heart rates as high as the 140s even at rest.  She has a home pulse ox that she has been checking her oxygen and its been down as low as 86% after walking.  She feels very fatigued and very short of breath with this.  Her daughter states over the last week she feels like she has barely gotten out of her recliner because of her shortness of breath.  She has no lower extremity edema.  She does not feel she is in heart failure.  She has some intermittent chest pain but nothing consistent.  She was post to follow-up with her cardiologist tomorrow in [facility] however things got worse tonight so she presented here for further evaluation   Chest Pain     Past Medical History:  Diagnosis Date   Allergic rhinitis    Anemia of renal disease 10/21/2011   Anemia, iron deficiency 10/21/2011   Cholelithiasis    Chronic diastolic heart failure (HCC)    Colon polyp    Constipation    Cough    2nd to reflux   Debility, unspecified    Dehydration    Depression    Diaphragm dysfunction    Right hemidiaphragm   Disorder of bone and cartilage, unspecified    Diverticulosis of colon    Dysphagia    GERD (gastroesophageal reflux disease) 02/08/2013   History of hyperkalemia in setting of spironolactone 09/2010   Hyperlipidemia    Hypertension    Hypopotassemia    Hypothyroidism 02/08/2013   Long term (current) use of  anticoagulants    MDS (myelodysplastic syndrome), low grade ([place]) 10/21/2011   Obesity    Obesity hypoventilation syndrome (HCC)    OSA (obstructive sleep apnea)    Osteoarthrosis, unspecified whether generalized or localized, unspecified site    Other specified disease of white blood cells    Pacemaker    Implanted December 2012   Paroxysmal A-fib [facility]) with RVR   Amiodarone   Reflux esophagitis    Rosacea 05/15/2013   Senile cataract    Sinus node dysfunction (HCC)    Tracheobronchomalacia    Unspecified vitamin D deficiency    Urinary incontinence    Xerostomia     Patient Active Problem List   Diagnosis Date Noted   Acute respiratory failure with hypoxia ([place]) 04/20/2021   Typical atrial flutter (HCC)    (HFpEF) heart failure with preserved ejection fraction ([place]) 09/22/2020   Palpitations 09/22/2020   Pressure injury of right buttock, unstageable ([place]) 04/10/2019   Chronic respiratory failure with hypoxia ([place]) 04/10/2019   Right lower quadrant abdominal mass 04/10/2019   Hypercoagulable state due to atrial fibrillation ([place]) 09/11/2017   Word finding difficulty 09/06/2017   Chronic fatigue 09/06/2017   History of shingles 09/06/2017   OSA treated with BiPAP 03/09/2016   Hyperglycemia 03/09/2016   Cerumen impaction 03/09/2016   Benign essential  tremor 03/03/2014   Abdominal pain, other specified site 11/13/2013   Rosacea 05/15/2013   Hypothyroidism 02/08/2013   GERD (gastroesophageal reflux disease) 02/08/2013   Health care maintenance 02/08/2013   Constipation    Obesity    Anemia of renal disease 10/21/2011   Anemia, iron deficiency 10/21/2011   MDS (myelodysplastic syndrome), low grade ([place]) 10/21/2011   Pacemaker-[employer] 01/18/2011   SLEEP RELATED HYPOVENTILATION/HYPOXEMIA CCE 12/14/2010   Tracheobronchomalacia 12/14/2010   Long term current use of anticoagulant 11/22/2010   Chronic diastolic heart failure ([place]) 11/15/2010   EDEMA 10/07/2010   SICK SINUS  SYNDROME 10/01/2010   Atrial fibrillation ([place]) 09/21/2010   Hyperlipidemia 02/10/2010   Essential hypertension 02/10/2010   DYSPNEA 02/10/2010   Cough 02/10/2010    Past Surgical History:  Procedure Laterality Date   CARDIOVERSION N/A 12/17/2019   Procedure: CARDIOVERSION;  Surgeon: Skeet Latch, MD;  Location: [facility];  Service: Cardiovascular;  Laterality: N/A;   CATARACT EXTRACTION  2007-01-08   PACEMAKER PLACEMENT  10/21/2010   STJ, dural chamber Dr. [name]   TOTAL KNEE ARTHROPLASTY  1999   right knee   TOTAL KNEE ARTHROPLASTY  January 07, 2002   left knee   TUBAL LIGATION  08-Jan-1963   Tummy tuck  01-08-1999   pt "almost died"; respiratory distress, became delrious     OB History   No obstetric history on file.     Family History  Problem Relation Age of Onset   Heart disease Father    Parkinson's disease Sister    Heart disease Brother    Parkinson's disease Brother     Social History   Tobacco Use   Smoking status: Former    Packs/day: 0.50    Types: Cigarettes    Quit date: 10/03/1982    Years since quitting: 38.5   Smokeless tobacco: Never  Vaping Use   Vaping Use: Never used  Substance Use Topics   Alcohol use: Yes    Comment: very rarely.  none in one year   Drug use: No    Home Medications Prior to Admission medications   Medication Sig Start Date End Date Taking? Authorizing Provider  acetaminophen (TYLENOL) 325 MG tablet Take 325-650 mg by mouth every 4 (four) hours as needed for moderate pain.   Yes [provider]  allopurinol (ZYLOPRIM) 100 MG tablet Take 100 mg by mouth daily.   Yes [provider]  bisacodyl (DULCOLAX) 10 MG suppository Place 10 mg rectally as needed for moderate constipation.   Yes [provider]  dextromethorphan-guaiFENesin (MUCINEX DM) 30-600 MG 12hr tablet Take 1 tablet by mouth 2 (two) times daily as needed (cough / congestion).   Yes [provider]  ezetimibe (ZETIA) 10 MG tablet Take 10 mg by mouth  every evening.    Yes [provider]  fluticasone (FLONASE) 50 MCG/ACT nasal spray Place 2 sprays into the nose 2 (two) times daily. 2 sprays in each nostril Patient taking differently: Place 2 sprays into the nose 2 (two) times daily. 04/10/13  Yes Krell, Claudette T, NP  ipratropium-albuterol (DUONEB) 0.5-2.5 (3) MG/3ML SOLN Take 3 mLs by nebulization every 6 (six) hours as needed (Shortness of Breath/Wheezing).   Yes [provider]  levothyroxine (SYNTHROID, LEVOTHROID) 75 MCG tablet Take 75 mcg by mouth daily before breakfast.  02/22/18  Yes [provider]  metoprolol succinate (TOPROL XL) 25 MG 24 hr tablet Take 1 tablet (25 mg total) by mouth at bedtime. 12/24/19 04/20/21 Yes Sherran Needs, NP  polyethylene glycol powder (GLYCOLAX/MIRALAX) 17 GM/SCOOP powder Take 17 g by mouth daily as needed for mild constipation.   Yes [provider]  potassium chloride SA (K-DUR,KLOR-CON) 20 MEQ tablet Take 40 mEq by mouth daily.   Yes [provider]  Rivaroxaban (XARELTO) 15 MG TABS tablet Take 1 tablet (15 mg total) by mouth daily with supper. 09/17/14  Yes Lauree Chandler, NP  sertraline (ZOLOFT) 50 MG tablet Take 1 tablet (50 mg total) by mouth daily. 09/30/20  Yes [name], [name], DO  torsemide (DEMADEX) 20 MG tablet Take 20 mg by mouth daily.   Yes [provider]    Allergies    Codeine, Iron, Morphine, and Toviaz [fesoterodine fumarate]  Review of Systems   Review of Systems  Cardiovascular:  Positive for chest pain.  All other systems reviewed and are negative.  Physical Exam Updated Vital Signs BP (!) 115/56   Pulse (!) 102   Temp 97.7 F (36.5 C) (Oral)   Resp 19   Ht 5\' 4"  (1.626 m)   Wt 93.7 kg   SpO2 100%   BMI 35.46 kg/m   Physical Exam Vitals and nursing Cervantes reviewed.  Constitutional:      Appearance: She is well-developed.  HENT:     Head: Normocephalic and atraumatic.     Mouth/Throat:     Mouth: Mucous  membranes are moist.     Pharynx: Oropharynx is clear.  Eyes:     Pupils: Pupils are equal, round, and reactive to light.  Cardiovascular:     Rate and Rhythm: Tachycardia present. Rhythm irregular.  Pulmonary:     Effort: Tachypnea present. No respiratory distress.     Breath sounds: No stridor.  Abdominal:     General: Abdomen is flat. There is no distension.  Musculoskeletal:        General: No swelling or tenderness. Normal range of motion.     Cervical back: Normal range of motion.  Skin:    General: Skin is warm and dry.  Neurological:     General: No focal deficit present.     Mental Status: She is alert.    ED Results / Procedures / Treatments   Labs (all labs ordered are listed, but only abnormal results are displayed) Labs Reviewed  CBC WITH DIFFERENTIAL/PLATELET - Abnormal; Notable for the following components:      Result Value   RBC 3.36 (*)    Hemoglobin 10.5 (*)    HCT 35.5 (*)    MCV 105.7 (*)    MCHC 29.6 (*)    All other components within normal limits  COMPREHENSIVE METABOLIC PANEL - Abnormal; Notable for the following components:   CO2 36 (*)    Glucose, Bld 126 (*)    Creatinine, Ser 1.55 (*)    Calcium 8.7 (*)    Total Protein 6.1 (*)    Albumin 3.0 (*)    GFR, Estimated 32 (*)    Anion gap 4 (*)    All other components within normal limits  BRAIN NATRIURETIC PEPTIDE - Abnormal; Notable for the following components:   B Natriuretic Peptide 183.9 (*)    All other components within normal limits  BLOOD GAS, VENOUS - Abnormal; Notable for the following components:   pCO2, Ven 73.6 (*)    Bicarbonate 34.7 (*)    Acid-Base Excess 8.3 (*)    All other components within normal limits  I-STAT VENOUS BLOOD GAS, ED - Abnormal; Notable for the following components:  Bicarbonate 34.6 (*)    TCO2 36 (*)    Acid-Base Excess 8.0 (*)    Calcium, Ion 1.05 (*)    HCT 33.0 (*)    Hemoglobin 11.2 (*)    All other components within normal limits  SARS  CORONAVIRUS 2 (TAT 6-24 HRS)  TSH  MAGNESIUM  CBC  BASIC METABOLIC PANEL  TROPONIN I (HIGH SENSITIVITY)  TROPONIN I (HIGH SENSITIVITY)    EKG None  Radiology CT Angio Chest PE W and/or Wo Contrast  Result Date: 04/20/2021 CLINICAL DATA:  High probability for pulmonary embolism. EXAM: CT ANGIOGRAPHY CHEST WITH CONTRAST TECHNIQUE: Multidetector CT imaging of the chest was performed using the standard protocol during bolus administration of intravenous contrast. Multiplanar CT image reconstructions and MIPs were obtained to evaluate the vascular anatomy. CONTRAST:  45mL OMNIPAQUE IOHEXOL 350 MG/ML SOLN COMPARISON:  10/27/2010 FINDINGS: Cardiovascular: Satisfactory opacification of the pulmonary arteries to the segmental level. No evidence of pulmonary embolism. Enlarged heart. Dual-chamber pacer leads in place. Atheromatous calcification of the aorta. Mediastinum/Nodes: Negative for hematoma or visible inflammation Lungs/Pleura: Very elevated right diaphragm and generally low volume chest. The bronchus intermedius is narrowed with partial opacification. Mild atelectatic type densities at the left base. There is a band of atelectasis at the right base. No edema or consolidation Upper Abdomen: No acute finding. Peripheral gallbladder calcification similar to prior. Given the occasional crescentic morphology this is at least partially related to calculi ; there could also be some mural calcification. Musculoskeletal: No acute finding Review of the MIP images confirms the above findings. IMPRESSION: 1. Negative for pulmonary embolism. 2. Low volume chest with chronic marked elevation of the right diaphragm and atelectasis. Bronchus intermedius stenosis/collapse, likely chronic. 3. Cholelithiasis. There may also be gallbladder wall calcification. Electronically Signed   By: Monte Fantasia M.D.   On: 04/20/2021 04:34   DG Chest Portable 1 View  Result Date: 04/20/2021 CLINICAL DATA:  Left chest pain, AFib  with RVR EXAM: PORTABLE CHEST 1 VIEW COMPARISON:  09/05/2012 FINDINGS: Low lung volumes. No focal consolidation. No pleural effusion or pneumothorax. Chronic eventration of right hemidiaphragm. The heart is normal in size. Thoracic aortic atherosclerosis. Left subclavian pacemaker. IMPRESSION: Low lung volumes with chronic eventration of the right hemidiaphragm. No evidence of acute cardiopulmonary disease. Electronically Signed   By: Julian Hy M.D.   On: 04/20/2021 01:50   ECHOCARDIOGRAM COMPLETE  Result Date: 04/20/2021    ECHOCARDIOGRAM REPORT   Patient Name:   Cindy Cervantes Date of Exam: 04/20/2021 Medical Rec #:  [id number]       Height:       64.0 in Accession #:    [id number]      Weight:       206.6 lb Date of Birth:  07/09/1932       BSA:          1.983 m Patient Age:    9 years        BP:           128/72 mmHg Patient Gender: F               HR:           107 bpm. Exam Location:  Inpatient Procedure: 2D Echo, Color Doppler and Cardiac Doppler Indications:    I48.91* Unspecified atrial fibrillation  History:        Patient has prior history of Echocardiogram examinations, most  recent 12/06/2017. CHF, Pacemaker, Arrythmias:Atrial Fibrillation;                 Risk Factors:Hypertension, Dyslipidemia and Sleep Apnea.  Sonographer:    Raquel Sarna Senior RDCS Referring Phys: [id number] [place]  1. Left ventricular ejection fraction, by estimation, is 60 to 65%. The left ventricle has normal function. The left ventricle has no regional wall motion abnormalities. Left ventricular diastolic function could not be evaluated.  2. Right ventricular systolic function is normal. The right ventricular size is normal. There is normal pulmonary artery systolic pressure.  3. The mitral valve is normal in structure. Trivial mitral valve regurgitation.  4. The aortic valve is grossly normal. There is moderate calcification of the aortic valve. There is moderate thickening of the aortic  valve. Aortic valve regurgitation is not visualized. Mild to moderate aortic valve sclerosis/calcification is present,  without any evidence of aortic stenosis.  5. The inferior vena cava is normal in size with <50% respiratory variability, suggesting right atrial pressure of 8 mmHg. FINDINGS  Left Ventricle: Left ventricular ejection fraction, by estimation, is 60 to 65%. The left ventricle has normal function. The left ventricle has no regional wall motion abnormalities. The left ventricular internal cavity size was normal in size. There is  no left ventricular hypertrophy. Left ventricular diastolic function could not be evaluated due to atrial fibrillation. Left ventricular diastolic function could not be evaluated. Right Ventricle: The right ventricular size is normal. No increase in right ventricular wall thickness. Right ventricular systolic function is normal. There is normal pulmonary artery systolic pressure. The tricuspid regurgitant velocity is 2.05 m/s, and  with an assumed right atrial pressure of 8 mmHg, the estimated right ventricular systolic pressure is 82.9 mmHg. Left Atrium: Left atrial size was normal in size. Right Atrium: Right atrial size was normal in size. Pericardium: There is no evidence of pericardial effusion. Mitral Valve: The mitral valve is normal in structure. Trivial mitral valve regurgitation. Tricuspid Valve: The tricuspid valve is normal in structure. Tricuspid valve regurgitation is mild. Aortic Valve: The aortic valve is grossly normal. There is moderate calcification of the aortic valve. There is moderate thickening of the aortic valve. Aortic valve regurgitation is not visualized. Mild to moderate aortic valve sclerosis/calcification is present, without any evidence of aortic stenosis. Pulmonic Valve: The pulmonic valve was not well visualized. Pulmonic valve regurgitation is not visualized. No evidence of pulmonic stenosis. Aorta: The aortic root and ascending aorta are  structurally normal, with no evidence of dilitation. Venous: The inferior vena cava is normal in size with less than 50% respiratory variability, suggesting right atrial pressure of 8 mmHg. IAS/Shunts: No atrial level shunt detected by color flow Doppler. Additional Comments: A device lead is visualized in the right ventricle.  LEFT VENTRICLE PLAX 2D LVIDd:         4.90 cm  Diastology LVIDs:         2.90 cm  LV e' medial:    6.64 cm/s LV PW:         1.10 cm  LV E/e' medial:  19.3 LV IVS:        0.70 cm  LV e' lateral:   8.59 cm/s LVOT diam:     1.90 cm  LV E/e' lateral: 14.9 LV SV:         37 LV SV Index:   19 LVOT Area:     2.84 cm  RIGHT VENTRICLE RV S prime:     6.96  cm/s TAPSE (M-mode): 1.8 cm LEFT ATRIUM             Index       RIGHT ATRIUM           Index LA diam:        3.70 cm 1.87 cm/m  RA Area:     14.30 cm LA Vol (A2C):   48.9 ml 24.65 ml/m RA Volume:   30.20 ml  15.23 ml/m LA Vol (A4C):   46.7 ml 23.55 ml/m LA Biplane Vol: 49.0 ml 24.70 ml/m  AORTIC VALVE LVOT Vmax:   65.80 cm/s LVOT Vmean:  46.500 cm/s LVOT VTI:    0.130 m  AORTA Ao Root diam: 2.90 cm Ao Asc diam:  3.50 cm MITRAL VALVE                TRICUSPID VALVE MV Area (PHT): 3.60 cm     TR Peak grad:   16.8 mmHg MV Decel Time: 211 msec     TR Vmax:        205.00 cm/s MV E velocity: 128.00 cm/s MV A velocity: 41.40 cm/s   SHUNTS MV E/A ratio:  3.09         Systemic VTI:  0.13 m                             Systemic Diam: 1.90 cm Dani Gobble Croitoru MD Electronically signed by Sanda Klein MD Signature Date/Time: 04/20/2021/4:24:52 PM    Final    CUP PACEART REMOTE DEVICE CHECK  Result Date: 04/20/2021 Scheduled monthly (MOD) remote reviewed. Normal device function.  Battery longevity <3 months. Battery voltage 2.63V AF burden 17%, AF on presenting rhythm with onset on 03/28/21. Rates controlled. [place]- Xarelto Routing for further review and management Next remote 91 days- JBox, RN/CVRS   Procedures .Critical Care  Date/Time: 04/20/2021  11:16 PM Performed by: Merrily Pew, MD Authorized by: Merrily Pew, MD   Critical care provider statement:    Critical care time (minutes):  45   Critical care was necessary to treat or prevent imminent or life-threatening deterioration of the following conditions:  Circulatory failure and cardiac failure   Critical care was time spent personally by me on the following activities:  Discussions with consultants, evaluation of patient's response to treatment, examination of patient, ordering and performing treatments and interventions, ordering and review of laboratory studies, ordering and review of radiographic studies, pulse oximetry, re-evaluation of patient's condition, obtaining history from patient or surrogate and review of old charts   Medications Ordered in ED Medications  diltiazem (CARDIZEM) 1 mg/mL load via infusion 15 mg (15 mg Intravenous Bolus from Bag 04/20/21 0448)    And  diltiazem (CARDIZEM) 125 mg in dextrose 5% 125 mL (1 mg/mL) infusion (10 mg/hr Intravenous New Bag/Given 04/20/21 1903)  sodium chloride flush (NS) 0.9 % injection 3 mL (3 mLs Intravenous Given 04/20/21 2117)  acetaminophen (TYLENOL) tablet 650 mg (has no administration in time range)    Or  acetaminophen (TYLENOL) suppository 650 mg (has no administration in time range)  ondansetron (ZOFRAN) tablet 4 mg (has no administration in time range)    Or  ondansetron (ZOFRAN) injection 4 mg (has no administration in time range)  allopurinol (ZYLOPRIM) tablet 100 mg (100 mg Oral Given 04/20/21 1107)  ezetimibe (ZETIA) tablet 10 mg (10 mg Oral Given 04/20/21 1747)  metoprolol succinate (TOPROL-XL) 24 hr tablet 25 mg (25 mg Oral Given 04/20/21  2117)  sertraline (ZOLOFT) tablet 50 mg (50 mg Oral Given 04/20/21 1107)  levothyroxine (SYNTHROID) tablet 75 mcg (has no administration in time range)  bisacodyl (DULCOLAX) suppository 10 mg (has no administration in time range)  ipratropium-albuterol (DUONEB) 0.5-2.5 (3) MG/3ML  nebulizer solution 3 mL (has no administration in time range)  fluticasone (FLONASE) 50 MCG/ACT nasal spray 2 spray (has no administration in time range)  potassium chloride SA (KLOR-CON) CR tablet 40 mEq (40 mEq Oral Given 04/20/21 1107)  polyethylene glycol (MIRALAX / GLYCOLAX) packet 17 g (17 g Oral Given 04/20/21 1107)  torsemide (DEMADEX) tablet 20 mg (20 mg Oral Given 04/20/21 1107)  guaiFENesin (MUCINEX) 12 hr tablet 600 mg (600 mg Oral Given 04/20/21 2117)  Rivaroxaban (XARELTO) tablet 15 mg (15 mg Oral Given 04/20/21 1747)  metoprolol tartrate (LOPRESSOR) injection 5 mg (5 mg Intravenous Given 04/20/21 0133)  iohexol (OMNIPAQUE) 350 MG/ML injection 75 mL (75 mLs Intravenous Contrast Given 04/20/21 0421)  sodium chloride 0.9 % bolus 1,000 mL (0 mLs Intravenous Stopped 04/20/21 0650)  furosemide (LASIX) injection 40 mg (40 mg Intravenous Given 04/20/21 1415)    ED Course  I have reviewed the triage vital signs and the nursing notes.  Pertinent labs & imaging results that were available during my care of the patient were reviewed by me and considered in my medical decision making (see chart for details).    MDM Rules/Calculators/A&P                           I expected patient to have acute heart failure related to her acute worsening A. fib RVR however her x-ray did not seem to show that.  Her BNP was mildly elevated but not significantly so.  She did not have any external signs of pulmonary edema/fluid overload so I did not initiate diuretics.  I did do a CT scan to evaluate for pulmonary embolus as that is possible even though she is on Eliquis.  This was negative but also did not show any fluid or pneumonia or other causes for her hypoxia.  During her ER stay her heart rate slowly trended up.  This point I suspect it may be the atrial fibrillation that was causing her hypoxia without another obvious cause.  Started on diltiazem bolus and infusion and will admit to the hospitalist.  Final  Clinical Impression(s) / ED Diagnoses Final diagnoses:  Atrial fibrillation with rapid ventricular response [facility])    Rx / DC Orders ED Discharge Orders     None        Curtistine Pettitt, Corene Cornea, MD 04/20/21 2317

## 2021-04-20 NOTE — Consult Note (Signed)
Cardiology Consultation:   Patient ID: Cindy Cervantes MRN: [id number]; DOB: 1932-01-02  Admit date: 04/20/2021 Date of Consult: 04/20/2021  PCP:  Mahlon Gammon, MD   [facility] Providers Cardiologist:  None  Electrophysiologist:  Sherryl Manges, MD  {  Patient Profile:   Cindy Cervantes is a 87 y.o. female with a history of persistent atrial fibrillation on Xarelto, sinus node dysfunction s/p PPM placement in 2011, chronic diastolic CHF with EF of 65-70% on last Echo in 2019, hypertension, hyperlipidemia, hypothyroidism, obstructive sleep apnea on CPAP, GERD, and myelodysplastic syndrome who is being seen 04/20/2021 for the evaluation of atrial flutter at the request of Dr. Katrinka Blazing.  History of Present Illness:   Cindy Cervantes is a 87 year female with the above history who is followed by Dr. Graciela Husbands. Patient was last seen by Dr. Graciela Husbands in 09/2020 at which time she was complaining of worsening shortness of breath with exertion, chronic but worsening orthopnea, and some peripheral edema. Torsemide was increased to 40mg  daily x5 days.   Patient presented to the ED today for further evaluation of chest pain. She started having atypical chest pain on evening of 04/20/2019. She describes it as a soreness around pacemaker site that then began to radiate to left axillary area. Reproducible and made worse with palpation of chest wall consistent with musculoskeletal pain. She states that she has had this pain intermittently on the right side after using right arm a lot playing games on her iPad. Also slightly worse when taking a deep breath. No exertional chest pain. She decided to come to the ED mainly because she has just been feeling poorly for the last 6 weeks. She reports significant dyspnea on exertion for the last 6 weeks. She has had to start using 2L of O2 again both at exertion and at rest after being off of it for several years. She describes stable orthopnea and no PND or lower extremity edema. She  is generally unaware of when she is in atrial fibrillation/flutter. She reports her heart rates have been elevated the last couple of weeks in a least the low 100s and were as high as the 140s on 04/18/2021. She rarely has palpitations and reports occasional dizziness that is not necessarily associated with position changes. No syncope.  In the ED, EKG showed atrial flutter, rate 106 bpm, with chronic RBBB. High-sensitivity troponin negative x2.  BNP mildly elevated at 183.9.  Chest x-ray showed low lung volumes with chronic eventration of the right hemidiaphragm but no acute findings. Chest CTA negative for PE but showed low volume chest with chronic marked elevation of the right diaphragm and atelectasis as well as bronchus intermedius stenosis/collapse (likely chronic). WBC 9.6, Hgb 10.5, Plts 188. Na 141, K 4.6, Glucose 126, BUN 20, Cr 1.55. Albumin 3.0, AST 23, ALT 10, Alk Phos 80, Total Bili 1.0.   At the time of this evaluation, patient resting comfortably in no acute distress. Atypical chest pain has almost completely resolved.  Pacemaker interrogated in the ED an showed 61% atrial paced, <1% ventricular paced, and 172 AMS events since 09/2020. He has had increasing atrial fibrillation/flutter since about mid June and has been in consistent atrial fibrillation/flutter for the last couple of weeks.   Past Medical History:  Diagnosis Date   Allergic rhinitis    Anemia of renal disease 10/21/2011   Anemia, iron deficiency 10/21/2011   Cholelithiasis    Chronic diastolic heart failure (HCC)    Colon polyp  Constipation    Cough    2nd to reflux   Debility, unspecified    Dehydration    Depression    Diaphragm dysfunction    Right hemidiaphragm   Disorder of bone and cartilage, unspecified    Diverticulosis of colon    Dysphagia    GERD (gastroesophageal reflux disease) 02/08/2013   History of hyperkalemia in setting of spironolactone 09/2010   Hyperlipidemia    Hypertension     Hypopotassemia    Hypothyroidism 02/08/2013   Long term (current) use of anticoagulants    MDS (myelodysplastic syndrome), low grade ([place]) 10/21/2011   Obesity    Obesity hypoventilation syndrome (HCC)    OSA (obstructive sleep apnea)    Osteoarthrosis, unspecified whether generalized or localized, unspecified site    Other specified disease of white blood cells    Pacemaker    Implanted December 2012   Paroxysmal A-fib [facility]) with RVR   Amiodarone   Reflux esophagitis    Rosacea 05/15/2013   Senile cataract    Sinus node dysfunction ([place])    Tracheobronchomalacia    Unspecified vitamin D deficiency    Urinary incontinence    Xerostomia     Past Surgical History:  Procedure Laterality Date   CARDIOVERSION N/A 12/17/2019   Procedure: CARDIOVERSION;  Surgeon: Skeet Latch, MD;  Location: [place];  Service: Cardiovascular;  Laterality: N/A;   CATARACT EXTRACTION  Dec 12, 2006   PACEMAKER PLACEMENT  10/21/2010   STJ, dural chamber Dr. [name]   TOTAL KNEE ARTHROPLASTY  1999   right knee   TOTAL KNEE ARTHROPLASTY  12-11-01   left knee   TUBAL LIGATION  12-Dec-1962   Tummy tuck  1998/12/12   pt "almost died"; respiratory distress, became delrious     Home Medications:  Prior to Admission medications   Medication Sig Start Date End Date Taking? Authorizing Provider  acetaminophen (TYLENOL) 325 MG tablet Take 325-650 mg by mouth every 4 (four) hours as needed for moderate pain.   Yes [provider]  allopurinol (ZYLOPRIM) 100 MG tablet Take 100 mg by mouth daily.   Yes [provider]  bisacodyl (DULCOLAX) 10 MG suppository Place 10 mg rectally as needed for moderate constipation.   Yes [provider]  dextromethorphan-guaiFENesin (MUCINEX DM) 30-600 MG 12hr tablet Take 1 tablet by mouth 2 (two) times daily as needed (cough / congestion).   Yes [provider]  ezetimibe (ZETIA) 10 MG tablet Take 10 mg by mouth every evening.    Yes [provider]   fluticasone (FLONASE) 50 MCG/ACT nasal spray Place 2 sprays into the nose 2 (two) times daily. 2 sprays in each nostril Patient taking differently: Place 2 sprays into the nose 2 (two) times daily. 04/10/13  Yes Krell, Claudette T, NP  ipratropium-albuterol (DUONEB) 0.5-2.5 (3) MG/3ML SOLN Take 3 mLs by nebulization every 6 (six) hours as needed (Shortness of Breath/Wheezing).   Yes [provider]  levothyroxine (SYNTHROID, LEVOTHROID) 75 MCG tablet Take 75 mcg by mouth daily before breakfast.  02/22/18  Yes [provider]  metoprolol succinate (TOPROL XL) 25 MG 24 hr tablet Take 1 tablet (25 mg total) by mouth at bedtime. 12/24/19 04/20/21 Yes Sherran Needs, NP  polyethylene glycol powder (GLYCOLAX/MIRALAX) 17 GM/SCOOP powder Take 17 g by mouth daily as needed for mild constipation.   Yes [provider]  potassium chloride SA (K-DUR,KLOR-CON) 20 MEQ tablet Take 40 mEq by mouth daily.   Yes [provider]  Rivaroxaban (  XARELTO) 15 MG TABS tablet Take 1 tablet (15 mg total) by mouth daily with supper. 09/17/14  Yes Lauree Chandler, NP  sertraline (ZOLOFT) 50 MG tablet Take 1 tablet (50 mg total) by mouth daily. 09/30/20  Yes [name], [name], DO  torsemide (DEMADEX) 20 MG tablet Take 20 mg by mouth daily.   Yes [provider]    Inpatient Medications: Scheduled Meds:  allopurinol  100 mg Oral Daily   enoxaparin (LOVENOX) injection  90 mg Subcutaneous Q24H   ezetimibe  10 mg Oral QPM   [START ON 04/21/2021] fluticasone  2 spray Each Nare BID   guaiFENesin  600 mg Oral BID   [START ON 04/21/2021] levothyroxine  75 mcg Oral QAC breakfast   metoprolol succinate  25 mg Oral QHS   polyethylene glycol  17 g Oral Daily   potassium chloride SA  40 mEq Oral Daily   sertraline  50 mg Oral Daily   sodium chloride flush  3 mL Intravenous Q12H   torsemide  20 mg Oral Daily   Continuous Infusions:  diltiazem (CARDIZEM) infusion 5 mg/hr (04/20/21 0444)    PRN Meds: acetaminophen **OR** acetaminophen, bisacodyl, ipratropium-albuterol, ondansetron **OR** ondansetron (ZOFRAN) IV  Allergies:    Allergies  Allergen Reactions   Codeine     Extreme lethargy   Iron     By infusion - back spasms    Morphine     Hallucinations, altered mental state   Toviaz [Fesoterodine Fumarate]     hyperactive    Social History:   Social History   Socioeconomic History   Marital status: Widowed    Spouse name: Not on file   Number of children: Not on file   Years of education: Not on file   Highest education level: Not on file  Occupational History   Occupation: retired    Fish farm manager: RETIRED    Comment: Network engineer   Tobacco Use   Smoking status: Former    Packs/day: 0.50    Types: Cigarettes    Quit date: 10/03/1982    Years since quitting: 38.5   Smokeless tobacco: Never  Vaping Use   Vaping Use: Never used  Substance and Sexual Activity   Alcohol use: Yes    Comment: very rarely.  none in one year   Drug use: No   Sexual activity: Never  Other Topics Concern   Not on file  Social History Narrative   Widowed 2002 (married 1955), originally form [place]. 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 Moved from [place], [place] to [place], [place] section 2009. Moved to [place] section 2012.    Non smoker, mininmal alcohol.    Has POA   Power wheelchair, walker   Exercise none               Social Determinants of Health   Financial Resource Strain: Not on file  Food Insecurity: Not on file  Transportation Needs: Not on file  Physical Activity: Not on file  Stress: Not on file  Social Connections: Not on file  Intimate Partner Violence: Not on file    Family History:    Family History  Problem Relation Age of Onset   Heart disease Father    Parkinson's disease Sister    Heart disease Brother    Parkinson's disease Brother      ROS:  Please see the  history of present illness. All other ROS reviewed and negative.  Physical Exam/Data:   Vitals:   04/20/21 0650 04/20/21 0651 04/20/21 0729 04/20/21 1109  BP:  (!) 151/85 134/79 128/72  Pulse: 73 71 98 73  Resp: 19 (!) 22 (!) 21 20  Temp:   97.7 F (36.5 C)   TempSrc:      SpO2: 99% 99% 98% 99%  Weight:      Height:        Intake/Output Summary (Last 24 hours) at 04/20/2021 1323 Last data filed at 04/20/2021 0650 Gross per 24 hour  Intake 1000 ml  Output --  Net 1000 ml   Last 3 Weights 04/20/2021 11/11/2020 11/04/2020  Weight (lbs) 206 lb 9.1 oz 206 lb 9.6 oz 207 lb  Weight (kg) 93.7 kg 93.713 kg 93.895 kg     Body mass index is 35.46 kg/m.  General: 87 y.o. female resting comfortably in no acute distress. HEENT: Normocephalic and atraumatic. Sclera clear.  Neck: Supple. No carotid bruits. No JVD. Heart: Irregular rhythm with mild tachycardia. No murmurs, gallops, or rubs. Radial and distal pedal pulses 2+ and equal bilaterally. Lungs: No increased work of breathing. Mild crackles in right base; otherwise, lungs clear.  Abdomen: Soft, non-distended, and non-tender to palpation. Bowel sounds present. Extremities: No significant lower extremity edema.    Skin: Warm and dry. Neuro: Alert and oriented x3. No focal deficits. Psych: Normal affect. Responds appropriately.  EKG:  The EKG was personally reviewed and demonstrates: Atrial flutter, rate 106 bpm, with RBBB.   Telemetry:  Telemetry was personally reviewed and demonstrates:  Atrial flutter with rates ranging from the 70s to 110s. PVCs also noted.  Relevant CV Studies:  Echocardiogram 12/06/2017: Study Conclusions: - Left ventricle: The cavity size was normal. There was moderate    focal basal hypertrophy. Systolic function was vigorous. The    estimated ejection fraction was in the range of 65% to 70%. Wall    motion was normal; there were no regional wall motion    abnormalities. There was an increased relative  contribution of    atrial contraction to ventricular filling. Doppler parameters are    consistent with abnormal left ventricular relaxation (grade 1    diastolic dysfunction).  - Aortic valve: Moderately calcified annulus. Probably trileaflet;    moderately thickened, moderately calcified leaflets.  - Mitral valve: There was trivial regurgitation.  - Tricuspid valve: There was trivial regurgitation.   Laboratory Data:  High Sensitivity Troponin:   Recent Labs  Lab 04/20/21 0135 04/20/21 0508  TROPONINIHS 11 12     Chemistry Recent Labs  Lab 04/20/21 0135 04/20/21 1011  NA 141 141  K 4.6 5.0  CL 101  --   CO2 36*  --   GLUCOSE 126*  --   BUN 20  --   CREATININE 1.55*  --   CALCIUM 8.7*  --   GFRNONAA 32*  --   ANIONGAP 4*  --     Recent Labs  Lab 04/20/21 0135  PROT 6.1*  ALBUMIN 3.0*  AST 23  ALT 10  ALKPHOS 80  BILITOT 1.0   Hematology Recent Labs  Lab 04/20/21 0135 04/20/21 1011  WBC 9.6  --   RBC 3.36*  --   HGB 10.5* 11.2*  HCT 35.5* 33.0*  MCV 105.7*  --   MCH 31.3  --   MCHC 29.6*  --   RDW 14.0  --   PLT 188  --    BNP Recent Labs  Lab 04/20/21 0135  BNP 183.9*  DDimer No results for input(s): DDIMER in the last 168 hours.   Radiology/Studies:  CT Angio Chest PE W and/or Wo Contrast  Result Date: 04/20/2021 CLINICAL DATA:  High probability for pulmonary embolism. EXAM: CT ANGIOGRAPHY CHEST WITH CONTRAST TECHNIQUE: Multidetector CT imaging of the chest was performed using the standard protocol during bolus administration of intravenous contrast. Multiplanar CT image reconstructions and MIPs were obtained to evaluate the vascular anatomy. CONTRAST:  58mL OMNIPAQUE IOHEXOL 350 MG/ML SOLN COMPARISON:  10/27/2010 FINDINGS: Cardiovascular: Satisfactory opacification of the pulmonary arteries to the segmental level. No evidence of pulmonary embolism. Enlarged heart. Dual-chamber pacer leads in place. Atheromatous calcification of the aorta.  Mediastinum/Nodes: Negative for hematoma or visible inflammation Lungs/Pleura: Very elevated right diaphragm and generally low volume chest. The bronchus intermedius is narrowed with partial opacification. Mild atelectatic type densities at the left base. There is a band of atelectasis at the right base. No edema or consolidation Upper Abdomen: No acute finding. Peripheral gallbladder calcification similar to prior. Given the occasional crescentic morphology this is at least partially related to calculi ; there could also be some mural calcification. Musculoskeletal: No acute finding Review of the MIP images confirms the above findings. IMPRESSION: 1. Negative for pulmonary embolism. 2. Low volume chest with chronic marked elevation of the right diaphragm and atelectasis. Bronchus intermedius stenosis/collapse, likely chronic. 3. Cholelithiasis. There may also be gallbladder wall calcification. Electronically Signed   By: Monte Fantasia M.D.   On: 04/20/2021 04:34   DG Chest Portable 1 View  Result Date: 04/20/2021 CLINICAL DATA:  Left chest pain, AFib with RVR EXAM: PORTABLE CHEST 1 VIEW COMPARISON:  09/05/2012 FINDINGS: Low lung volumes. No focal consolidation. No pleural effusion or pneumothorax. Chronic eventration of right hemidiaphragm. The heart is normal in size. Thoracic aortic atherosclerosis. Left subclavian pacemaker. IMPRESSION: Low lung volumes with chronic eventration of the right hemidiaphragm. No evidence of acute cardiopulmonary disease. Electronically Signed   By: Julian Hy M.D.   On: 04/20/2021 01:50     Assessment and Plan:   New Onset Atrial Flutter with RVR History of Persistent Atrial Fibrillation  - Rates currently in the 70s to 110s. Device interrogated and patient has had increasing episodes of atrial fibrillation/flutter since early June and has been in consistent atrial fibrillation/flutter the last couple of weeks. - Potassium and Magnesium OK. - TSH pending. -  LVEF normal on last Echo in 2019. Updated Echo pending.  - Continue IV Cardizem.  - Continue home Toprol-XL $RemoveBefore'25mg'AcAxdGHgwlTOH$  daily. - On chronic anticoagulation at home with Xarelto $RemoveBefo'15mg'xLWEOlZtvnS$  daily. Currently on Lovenox.  - I think patient would benefit from DCCV this admission. Will discuss with MD. Will restart home Xarelto. I confirmed that she has not missed any doses of this in the last month.  DCCV scheduled for tomorrow 04/21/2021 at 2:00pm with Dr. Marlou Porch.  Dyspnea on Exertion Acute on Chronic Combined Respiratory Failure - Patient reports worsening dyspnea on exertion over the last 6 weeks and states she has started to have to use 2L of O2 24/7 after being off O2 the past several years.  - Chest CTA negative for PE. - BNP mildly elevated but does not appear significantly volume overloaded on exam. No overt edema not on chest x-ray. Please see recommendations below. - Venous blood gas showed pH 7.375, pCO2 59.3, pO2 33.0, Bicarb 34.6. - Worsening symptoms of dyspnea on exertion seem to correlate with increase in atrial fibrillation/flutter as noted above. Think patient would benefit from DCCV. - Otherwise,  per primary team.   Chronic Diastolic CHF - BNP mildly elevated at 183. - Chest x-ray showed no acute edema. - Last Echo in 2019 showed LVEF of 65-70% with grade 1 diastolic dysfunction. - Appears euvolemic on exam. - Will give one dose of  IV Lasix 40mg  daily and then resume Torsemide 20mg  daily tomorrow. - Monitor daily weights, strict I/Os, and renal function.  Atypical Chest Pain - Patient describes left sided chest pain that is reproducible and worsened with palpation of chest wall. No exertional chest pain.  - EKG shows chronic RBBB but no acute ischemic changes.  - High-sensitivity troponin negative x2.  - Echo pending. - Sounds musculoskeletal. Do not think any additional ischemic work-up is needed.  Sinus Node Dysfunction s/p PPM - S/p Abbott PPM in 2011.  - Last remote  interrogation showed battery approaching ERI.  - EP will arrange outpatient follow-up to discuss gen change.  Hypertension - BP mostly well controlled. - Continue IV Cardizem - Continue Toprol-XL 25mg  daily.  Hyperlipidemia - LDL 105 in 10/2020. - Continue Zetia 10mg  daily. Intolerant to statins in the past.  Risk Assessment/Risk Scores:   New York Heart Association (NYHA) Functional Class NYHA Class III  CHA2DS2-VASc Score = 5  This indicates a 7.2% annual risk of stroke. The patient's score is based upon: CHF History: Yes HTN History: Yes Diabetes History: No Stroke History: No Vascular Disease History: No Age Score: 2 Gender Score: 1     For questions or updates, please contact [place] Please consult [email or web address] for contact info under    Signed, Darreld Mclean, PA-C  04/20/2021 1:23 PM

## 2021-04-20 NOTE — Progress Notes (Signed)
ANTICOAGULATION CONSULT NOTE - Initial Consult  Pharmacy Consult for lovenox  Indication:  Afib  Allergies  Allergen Reactions   Codeine     Extreme lethargy   Iron     By infusion - back spasms    Morphine     Hallucinations, altered mental state   Toviaz [Fesoterodine Fumarate]     hyperactive    Patient Measurements: Height: 5\' 4"  (162.6 cm) Weight: 93.7 kg (206 lb 9.1 oz) IBW/kg (Calculated) : 54.7  Vital Signs: Temp: 97.7 F (36.5 C) (07/19 0729) Temp Source: Oral (07/19 0034) BP: 134/79 (07/19 0729) Pulse Rate: 98 (07/19 0729)  Labs: Recent Labs    04/20/21 0135 04/20/21 0508 04/20/21 1011  HGB 10.5*  --  11.2*  HCT 35.5*  --  33.0*  PLT 188  --   --   CREATININE 1.55*  --   --   TROPONINIHS 11 12  --     Estimated Creatinine Clearance: 28.4 mL/min (A) (by C-G formula based on SCr of 1.55 mg/dL (H)).   Medical History: Past Medical History:  Diagnosis Date   Allergic rhinitis    Anemia of renal disease 10/21/2011   Anemia, iron deficiency 10/21/2011   Cholelithiasis    Chronic diastolic heart failure (HCC)    Colon polyp    Constipation    Cough    2nd to reflux   Debility, unspecified    Dehydration    Depression    Diaphragm dysfunction    Right hemidiaphragm   Disorder of bone and cartilage, unspecified    Diverticulosis of colon    Dysphagia    GERD (gastroesophageal reflux disease) 02/08/2013   History of hyperkalemia in setting of spironolactone 09/2010   Hyperlipidemia    Hypertension    Hypopotassemia    Hypothyroidism 02/08/2013   Long term (current) use of anticoagulants    MDS (myelodysplastic syndrome), low grade ([place]) 10/21/2011   Obesity    Obesity hypoventilation syndrome (HCC)    OSA (obstructive sleep apnea)    Osteoarthrosis, unspecified whether generalized or localized, unspecified site    Other specified disease of white blood cells    Pacemaker    Implanted December 2012   Paroxysmal A-fib [facility]) with RVR   Amiodarone    Reflux esophagitis    Rosacea 05/15/2013   Senile cataract    Sinus node dysfunction (HCC)    Tracheobronchomalacia    Unspecified vitamin D deficiency    Urinary incontinence    Xerostomia    Assessment: 87 yo F with chest pain since yesterday evening. Patient was initially tachy up to 100s, in Afib. Put on dilt gtt, and is on PTA xarelto. Holding DOAC in case of need of any procedure and will put on therapeutic dosing of lovenox.    Goal of Therapy:  Monitor platelets by anticoagulation protocol: Yes   Plan:  Lovenox 90mg  SubQ daily (given that CrCl is < 30 ml/min) -Monitor daily CBC and any s/sx of bleeding  Joetta Manners, PharmD, [facility] Emergency Medicine Clinical Pharmacist ED RPh Phone: [place]: [phone]

## 2021-04-20 NOTE — ED Notes (Signed)
Attempted to give reportx1 

## 2021-04-20 NOTE — H&P (Addendum)
History and Physical    Cindy Cervantes QMV:[id number] DOB: 06/13/1932 DOA: 04/20/2021  Referring MD/NP/PA: Francia Greaves, DO PCP: Virgie Dad, MD  Patient coming from: [facility] via EMS  Chief Complaint: Chest pain  I have personally briefly reviewed patient's old medical records in [place]   HPI: Cindy Cervantes is a 87 y.o. female with medical history significant of hypertension, hyperlipidemia, paroxysmal atrial fibrillation on anticoagulation, chronic respiratory failure SSS s/p PM, chronic diastolic congestive heart failure, anemia,  and MDS presented with complaints of chest pain yesterday evening.   Pain was described as dull and achy located on her left side overall her pacemaker while she was laying down.  Pain seemed to radiate into her armpit and she reported that it was sore to touch.  She reports that she had been having issues with her pulse being elevated here recently for which she had notified her cardiologist and had an upcoming appointment. At baseline patient ambulates with a walker, but can only walk a few feet prior to becoming out of breath.  She notes that this is relatively unchanged.  Associated symptoms include cough with intermittent wheeze.  Denies any significant change in weight, palpitations, diaphoresis, nausea, vomiting, dysuria, or worsening leg swelling.  Patient reports that she has been on 2 L of nasal cannula oxygen 24/7 and he has been compliant with her BiPAP at night.  She has been compliant with all of her medication and has not missed any doses of Xarelto.  Records note that patient's pacemaker was interrogated last month and noted to be near end of battery life.  Patient reports that she had taken full dose aspirin and 1 sublingual nitroglycerin with resolution of chest pain with EMS prior to arrival.  Heart rates were noted to be between 130-150.  ED Course: Upon admission into the emergency department patient was seen to be  afebrile, pulse 57-109, respiration 14-23, blood pressures 103/61-151/85, and O2 saturations maintained currently on 2 L of nasal cannula oxygen.  Labs significant for hemoglobin 10.5, CO2 36, BUN 20, creatinine 1.55, high-sensitivity troponin 11->12, and BNP 183.9.  Chest x-ray noted low lung volumes with chronic elevation of the right hemidiaphragm.  CTA of the chest showed no signs of a pulmonary embolus and low lung volume chronic elevation of the right hemidiaphragm and atelectasis with bronchus intermedius stenosis/collapse that was thought to be likely chronic.  Patient had been given 5 mg of metoprolol IV x1 prior to being started on Cardizem drip, and received 1 L normal saline IV fluids.  Patient has been accepted to a telemetry bed.  Review of Systems  Constitutional:  Positive for malaise/fatigue. Negative for fever.  HENT:  Negative for ear discharge and nosebleeds.   Eyes:  Negative for double vision and photophobia.  Respiratory:  Positive for cough, shortness of breath and wheezing.   Cardiovascular:  Positive for chest pain and leg swelling (Chronic). Negative for palpitations.  Gastrointestinal:  Positive for constipation. Negative for abdominal pain, nausea and vomiting.  Genitourinary:  Negative for dysuria and hematuria.  Musculoskeletal:  Negative for falls.  Skin:  Negative for rash.  Neurological:  Positive for tingling and weakness. Negative for focal weakness and loss of consciousness.  Psychiatric/Behavioral:  Negative for substance abuse.    Past Medical History:  Diagnosis Date   Allergic rhinitis    Anemia of renal disease 10/21/2011   Anemia, iron deficiency 10/21/2011   Cholelithiasis    Chronic diastolic heart  failure (HCC)    Colon polyp    Constipation    Cough    2nd to reflux   Debility, unspecified    Dehydration    Depression    Diaphragm dysfunction    Right hemidiaphragm   Disorder of bone and cartilage, unspecified    Diverticulosis of colon     Dysphagia    GERD (gastroesophageal reflux disease) 02/08/2013   History of hyperkalemia in setting of spironolactone 09/2010   Hyperlipidemia    Hypertension    Hypopotassemia    Hypothyroidism 02/08/2013   Long term (current) use of anticoagulants    MDS (myelodysplastic syndrome), low grade ([place]) 10/21/2011   Obesity    Obesity hypoventilation syndrome (HCC)    OSA (obstructive sleep apnea)    Osteoarthrosis, unspecified whether generalized or localized, unspecified site    Other specified disease of white blood cells    Pacemaker    Implanted December 2012   Paroxysmal A-fib [facility]) with RVR   Amiodarone   Reflux esophagitis    Rosacea 05/15/2013   Senile cataract    Sinus node dysfunction ([place])    Tracheobronchomalacia    Unspecified vitamin D deficiency    Urinary incontinence    Xerostomia     Past Surgical History:  Procedure Laterality Date   CARDIOVERSION N/A 12/17/2019   Procedure: CARDIOVERSION;  Surgeon: Skeet Latch, MD;  Location: [facility];  Service: Cardiovascular;  Laterality: N/A;   CATARACT EXTRACTION  12/28/2006   PACEMAKER PLACEMENT  10/21/2010   STJ, dural chamber Dr. [name]   TOTAL KNEE ARTHROPLASTY  1999   right knee   TOTAL KNEE ARTHROPLASTY  December 27, 2001   left knee   TUBAL LIGATION  28-Dec-1962   Tummy tuck  12-28-1998   pt "almost died"; respiratory distress, became delrious     reports that she quit smoking about 38 years ago. Her smoking use included cigarettes. She smoked an average of .5 packs per day. She has never used smokeless tobacco. She reports current alcohol use. She reports that she does not use drugs.  Allergies  Allergen Reactions   Codeine     Extreme lethargy   Iron     By infusion - back spasms    Morphine     Hallucinations, altered mental state   Toviaz [Fesoterodine Fumarate]     hyperactive    Family History  Problem Relation Age of Onset   Heart disease Father    Parkinson's disease Sister    Heart disease Brother    Parkinson's  disease Brother     Prior to Admission medications   Medication Sig Start Date End Date Taking? Authorizing Provider  acetaminophen (TYLENOL) 325 MG tablet Take 325-650 mg by mouth every 4 (four) hours as needed for moderate pain.    [provider]  allopurinol (ZYLOPRIM) 100 MG tablet Take 100 mg by mouth daily.    [provider]  bisacodyl (DULCOLAX) 10 MG suppository Place 10 mg rectally as needed for moderate constipation.    [provider]  dextromethorphan-guaiFENesin (MUCINEX DM) 30-600 MG 12hr tablet Take 1 tablet by mouth 2 (two) times daily as needed (cough / congestion).    [provider]  ezetimibe (ZETIA) 10 MG tablet Take 10 mg by mouth every evening.     [provider]  fluticasone (FLONASE) 50 MCG/ACT nasal spray Place 2 sprays into the nose 2 (two) times daily. 2 sprays in each nostril 04/10/13   Mardene Celeste, NP  ipratropium-albuterol (DUONEB) 0.5-2.5 (3) MG/3ML SOLN Take 3 mLs by nebulization every 6 (six) hours as needed.    [provider]  levothyroxine (SYNTHROID, LEVOTHROID) 75 MCG tablet Take 75 mcg by mouth daily before breakfast.  02/22/18   [provider]  metoprolol succinate (TOPROL XL) 25 MG 24 hr tablet Take 1 tablet (25 mg total) by mouth at bedtime. 12/24/19 12/23/20  Sherran Needs, NP  polyethylene glycol powder (GLYCOLAX/MIRALAX) 17 GM/SCOOP powder Take 17 g by mouth as needed for mild constipation.     [provider]  potassium chloride SA (K-DUR,KLOR-CON) 20 MEQ tablet Take 40 mEq by mouth daily.    [provider]  Rivaroxaban (XARELTO) 15 MG TABS tablet Take 1 tablet (15 mg total) by mouth daily with supper. 09/17/14   Lauree Chandler, NP  sertraline (ZOLOFT) 50 MG tablet Take 1 tablet (50 mg total) by mouth daily. 09/30/20   [name], [name], DO  torsemide (DEMADEX) 20 MG tablet Take 20 mg by mouth daily.    [provider]    Physical  Exam:  Constitutional: Elderly female appears to be in no acute distress at this time. Vitals:   04/20/21 0557 04/20/21 0650 04/20/21 0651 04/20/21 0729  BP: 124/69  (!) 151/85 134/79  Pulse: 71 73 71 98  Resp:  19 (!) 22 (!) 21  Temp:    97.7 F (36.5 C)  TempSrc:      SpO2:  99% 99% 98%  Weight:      Height:       Eyes: PERRL, lids and conjunctivae normal ENMT: Mucous membranes are moist. Posterior pharynx clear of any exudate or lesions. Neck: normal, supple, no masses, no thyromegaly.  No significant JVD appreciated Respiratory: Prolonged expiratory phase with initial mild expiratory wheeze that resolved on its own.  Patient currently on 2 L nasal cannula oxygen without Cardiovascular: Irregular.  Trace lower extremity edema. 2+ pedal pulses. No carotid bruits.  Abdomen: no tenderness, no masses palpated. No hepatosplenomegaly. Bowel sounds positive.  Musculoskeletal: no cyanosis. No joint deformity upper and lower extremities.  Skin: no rashes, lesions, ulcers. No induration Neurologic: CN 2-12 grossly intact. Sensation intact, DTR normal. Strength 5/5 in all 4.  Psychiatric: Normal judgment and insight. Alert and oriented x 3. Normal mood.     Labs on Admission: I have personally reviewed following labs and imaging studies  CBC: Recent Labs  Lab 04/20/21 0135  WBC 9.6  NEUTROABS 7.4  HGB 10.5*  HCT 35.5*  MCV 105.7*  PLT 627   Basic Metabolic Panel: Recent Labs  Lab 04/20/21 0135  NA 141  K 4.6  CL 101  CO2 36*  GLUCOSE 126*  BUN 20  CREATININE 1.55*  CALCIUM 8.7*   GFR: Estimated Creatinine Clearance: 28.4 mL/min (A) (by C-G formula based on SCr of 1.55 mg/dL (H)). Liver Function Tests: Recent Labs  Lab 04/20/21 0135  AST 23  ALT 10  ALKPHOS 80  BILITOT 1.0  PROT 6.1*  ALBUMIN 3.0*   No results for input(s): LIPASE, AMYLASE in the last 168 hours. No results for input(s): AMMONIA in the last 168 hours. Coagulation Profile: No results for  input(s): INR, PROTIME in the last 168 hours. Cardiac Enzymes: No results for input(s): CKTOTAL, CKMB, CKMBINDEX, TROPONINI in the last 168 hours. BNP (last 3 results) No results for input(s): PROBNP in the last 8760 hours. HbA1C: No results for input(s): HGBA1C in the last 72 hours. CBG: No results for input(s):  GLUCAP in the last 168 hours. Lipid Profile: No results for input(s): CHOL, HDL, LDLCALC, TRIG, CHOLHDL, LDLDIRECT in the last 72 hours. Thyroid Function Tests: No results for input(s): TSH, T4TOTAL, FREET4, T3FREE, THYROIDAB in the last 72 hours. Anemia Panel: No results for input(s): VITAMINB12, FOLATE, FERRITIN, TIBC, IRON, RETICCTPCT in the last 72 hours. Urine analysis:    Component Value Date/Time   COLORURINE AMBER BIOCHEMICALS MAY BE AFFECTED BY COLOR (A) 09/27/2010 1537   APPEARANCEUR CLOUDY (A) 09/27/2010 1537   LABSPEC 1.021 09/27/2010 1537   PHURINE 5.5 09/27/2010 1537   GLUCOSEU NEGATIVE 09/27/2010 1537   HGBUR NEGATIVE 09/27/2010 1537   BILIRUBINUR MODERATE (A) 09/27/2010 1537   KETONESUR NEGATIVE 09/27/2010 1537   PROTEINUR 30 (A) 09/27/2010 1537   UROBILINOGEN 4.0 (H) 09/27/2010 1537   NITRITE POSITIVE (A) 09/27/2010 1537   LEUKOCYTESUR SMALL (A) 09/27/2010 1537   Sepsis Labs: No results found for this or any previous visit (from the past 240 hour(s)).   Radiological Exams on Admission: CT Angio Chest PE W and/or Wo Contrast  Result Date: 04/20/2021 CLINICAL DATA:  High probability for pulmonary embolism. EXAM: CT ANGIOGRAPHY CHEST WITH CONTRAST TECHNIQUE: Multidetector CT imaging of the chest was performed using the standard protocol during bolus administration of intravenous contrast. Multiplanar CT image reconstructions and MIPs were obtained to evaluate the vascular anatomy. CONTRAST:  23mL OMNIPAQUE IOHEXOL 350 MG/ML SOLN COMPARISON:  10/27/2010 FINDINGS: Cardiovascular: Satisfactory opacification of the pulmonary arteries to the segmental level. No  evidence of pulmonary embolism. Enlarged heart. Dual-chamber pacer leads in place. Atheromatous calcification of the aorta. Mediastinum/Nodes: Negative for hematoma or visible inflammation Lungs/Pleura: Very elevated right diaphragm and generally low volume chest. The bronchus intermedius is narrowed with partial opacification. Mild atelectatic type densities at the left base. There is a band of atelectasis at the right base. No edema or consolidation Upper Abdomen: No acute finding. Peripheral gallbladder calcification similar to prior. Given the occasional crescentic morphology this is at least partially related to calculi ; there could also be some mural calcification. Musculoskeletal: No acute finding Review of the MIP images confirms the above findings. IMPRESSION: 1. Negative for pulmonary embolism. 2. Low volume chest with chronic marked elevation of the right diaphragm and atelectasis. Bronchus intermedius stenosis/collapse, likely chronic. 3. Cholelithiasis. There may also be gallbladder wall calcification. Electronically Signed   By: Monte Fantasia M.D.   On: 04/20/2021 04:34   DG Chest Portable 1 View  Result Date: 04/20/2021 CLINICAL DATA:  Left chest pain, AFib with RVR EXAM: PORTABLE CHEST 1 VIEW COMPARISON:  09/05/2012 FINDINGS: Low lung volumes. No focal consolidation. No pleural effusion or pneumothorax. Chronic eventration of right hemidiaphragm. The heart is normal in size. Thoracic aortic atherosclerosis. Left subclavian pacemaker. IMPRESSION: Low lung volumes with chronic eventration of the right hemidiaphragm. No evidence of acute cardiopulmonary disease. Electronically Signed   By: Julian Hy M.D.   On: 04/20/2021 01:50    EKG: Independently reviewed.  Atrial flutter at 106 bpm with PVC  Assessment/Plan Paroxysmal atrial fibrillation/atrial flutter on chronic anticoagulation: Patient presents with complaints of chest pain and was found to be in atrial flutter with heart rates  130-150 with EMS.  Patient had been given metoprolol 5 mg IV and subsequently started on a Cardizem drip. CHA2DS2-VASc score = at least 5. -Admit to a progressive bed -Continued Cardizem drip, but possibly can increase patient's metoprolol dose.  Will follow-up for cardiology recommendations -Replace electrolytes as needed at least a goal magnesium of 2  and potassium of 4 -Check echocardiogram -Substituted Lovenox per pharmacy for Xarelto in case of need of procedure -Cardiology consulted, we will follow-up for further recommendations.  Chest pain: Acute.  Patient reported having left-sided chest pain starting last night.  EKG did not show significant ischemic changes.  High-sensitivity troponins were negative x2.  She had been given full dose aspirin with EMS. -Follow-up echocardiogram  Chronic respiratory failure with hypoxiaCOPD: At this time patient was not actively wheezing on physical exam.  O2 saturations currently maintained on home O2 requirements CT scan did not note any acute concern for PE, but did note chronic bronchus medius stenosis/collapse.  She is followed in outpatient setting by Dr. Halford Chessman. -Continuous pulse oximetry with nasal cannula oxygen -Checking venous blood gas -Follow-up COVID-19 screening -DuoNebs every 6 hours as needed for shortness of breath/wheezing -Mucinex -Recommend outpatient follow-up with Dr. Halford Chessman  Sick sinus syndrome s/p pacemaker: Patient had [facility] pacemaker initially implanted in 2011.  Patient's pacemaker was interrogated last month and noted to be near end-of-life. -Nursing had interrogated pacemaker which was noted to be at end of battery life. -Per cardiology  Chronic diastolic congestive heart failure: Patient was noted only to have trace lower extremity edema and does not appear grossly fluid overloaded at this time.  BNP was just mildly elevated at 189.  Last echocardiogram revealed EF of 65-70% with grade 1 diastolic dysfunction in  06/1790. -Strict intake and output -Daily weight -Continue torsemide po  Chronic kidney disease stage IIIb: Creatinine 1.55 which appears around patient's baseline. -Continue to monitor kidney function  Anemia of chronic disease: Hemoglobin stable at 10.5 with repeat hemoglobin noted to be 11.2 g/dL which appears around patient's baseline.  Patient denies any reports of bleeding. -Continue to monitor  Chronic constipation -MiraLAX  Hypothyroidism -Check TSH -Continue levothyroxine  Obstructive sleep apnea/obesity hypoventilation syndrome patient followed in outpatient setting by Dr. Halford Chessman.  Patient is supposed to be on BiPAP 12/8 cm of H2O with 2 L of oxygen at night. -Continue BiPAP at night.  Obesity: BMI 35.46 kg/m  DNR: Present on admission.  DVT prophylaxis: Lovenox per pharmacy for possible need of procedure Code Status: DNR Family Communication: Daughter updated at bedside Disposition Plan: Hopefully discharge back to skilled nursing facility once medically Consults called: Cardiology Admission status: Inpatient, require more than 2 midnight stay for likely need of procedure  Norval Morton MD [facility]   If 7PM-7AM, please contact night-coverage   04/20/2021, 7:35 AM

## 2021-04-20 NOTE — ED Triage Notes (Signed)
Coming from [facility] with sudden LT sided CP. Pt noted to be in afib RVR with HR between 130-150. Given 324 ASA, 1 SL nitro with relief of CP. Pt also c/o SOB. Hf of afib and blood thinners.

## 2021-04-20 NOTE — ED Notes (Signed)
Pt transported to "treadmill" 

## 2021-04-20 NOTE — ED Notes (Signed)
Informed RN of heart monitor readings 

## 2021-04-20 NOTE — Progress Notes (Signed)
Patient placed on bipap at this time, tolerating well.

## 2021-04-20 NOTE — Progress Notes (Signed)
ZAYNA TOSTE [id number] Admission Data: 04/20/2021 6:07 PM Attending Provider: Norval Morton, MD  NWG:NFAOZ, Rene Kocher, MD  Cindy Cervantes is a 87 y.o. female patient admitted from ED awake, alert  & orientated  X 3,  DNR, VSS - Blood pressure 133/62, pulse 99, temperature (!) 97.4 F (36.3 C), temperature source Oral, resp. rate 19, height 5\' 4"  (1.626 m), weight 93.7 kg, SpO2 96 %., O2 No c/o shortness of breath, no c/o chest pain, no distress noted. Tele # [id number] placed and pt is currently running:atrial fibrillation, with ventricular rate of 84 .  Pt orientation to unit, room and routine. Information packet given to patient/family.  Admission INP armband ID verified with patient/family, and in place. SR up x 2, fall risk assessment complete with patient and family verbalizing understanding of risks associated with falls. Pt verbalizes an understanding of how to use the call bell and to call for help before getting out of bed.  Skin, clean-dry- intact without evidence of bruising, or skin tears.   No evidence of skin break down noted on exam.   Will continue to monitor and assist as needed.  Heide Scales, RN 04/20/2021 6:07 PM

## 2021-04-21 ENCOUNTER — Inpatient Hospital Stay (HOSPITAL_COMMUNITY): Payer: Medicare Other | Admitting: Anesthesiology

## 2021-04-21 ENCOUNTER — Encounter (HOSPITAL_COMMUNITY)
Admission: EM | Disposition: A | Discharge status: Nursing Home (Includes ICF) | Payer: Self-pay | Source: Skilled Nursing Facility | Attending: Internal Medicine

## 2021-04-21 ENCOUNTER — Other Ambulatory Visit: Payer: Self-pay | Admitting: Cardiology

## 2021-04-21 ENCOUNTER — Encounter (HOSPITAL_COMMUNITY): Payer: Self-pay | Admitting: Internal Medicine

## 2021-04-21 ENCOUNTER — Inpatient Hospital Stay (HOSPITAL_COMMUNITY): Payer: Medicare Other

## 2021-04-21 DIAGNOSIS — R931 Abnormal findings on diagnostic imaging of heart and coronary circulation: Secondary | ICD-10-CM

## 2021-04-21 DIAGNOSIS — J9611 Chronic respiratory failure with hypoxia: Secondary | ICD-10-CM

## 2021-04-21 DIAGNOSIS — G4733 Obstructive sleep apnea (adult) (pediatric): Secondary | ICD-10-CM

## 2021-04-21 DIAGNOSIS — I5032 Chronic diastolic (congestive) heart failure: Secondary | ICD-10-CM | POA: Diagnosis not present

## 2021-04-21 DIAGNOSIS — Z6835 Body mass index (BMI) 35.0-35.9, adult: Secondary | ICD-10-CM

## 2021-04-21 DIAGNOSIS — I4891 Unspecified atrial fibrillation: Secondary | ICD-10-CM | POA: Diagnosis not present

## 2021-04-21 HISTORY — PX: CARDIOVERSION: SHX1299

## 2021-04-21 LAB — BASIC METABOLIC PANEL
Anion gap: 9 (ref 5–15)
BUN: 16 mg/dL (ref 8–23)
CO2: 31 mmol/L (ref 22–32)
Calcium: 8.8 mg/dL — ABNORMAL LOW (ref 8.9–10.3)
Chloride: 102 mmol/L (ref 98–111)
Creatinine, Ser: 1.43 mg/dL — ABNORMAL HIGH (ref 0.44–1.00)
GFR, Estimated: 35 mL/min — ABNORMAL LOW (ref >60)
Glucose, Bld: 89 mg/dL (ref 70–99)
Potassium: 4 mmol/L (ref 3.5–5.1)
Sodium: 142 mmol/L (ref 135–145)

## 2021-04-21 LAB — CBC
HCT: 37.6 % (ref 36.0–46.0)
Hemoglobin: 11.2 g/dL — ABNORMAL LOW (ref 12.0–15.0)
MCH: 31.4 pg (ref 26.0–34.0)
MCHC: 29.8 g/dL — ABNORMAL LOW (ref 30.0–36.0)
MCV: 105.3 fL — ABNORMAL HIGH (ref 80.0–100.0)
Platelets: 177 10*3/uL (ref 150–400)
RBC: 3.57 MIL/uL — ABNORMAL LOW (ref 3.87–5.11)
RDW: 13.9 % (ref 11.5–15.5)
WBC: 10.7 10*3/uL — ABNORMAL HIGH (ref 4.0–10.5)
nRBC: 0 % (ref 0.0–0.2)

## 2021-04-21 SURGERY — CARDIOVERSION
Anesthesia: General

## 2021-04-21 MED ORDER — PROPOFOL 10 MG/ML IV BOLUS
INTRAVENOUS | Status: DC | PRN
  Administered 2021-04-21: 70 mg via INTRAVENOUS

## 2021-04-21 MED ORDER — LIDOCAINE 2% (20 MG/ML) 5 ML SYRINGE
INTRAMUSCULAR | Status: DC | PRN
  Administered 2021-04-21: 60 mg via INTRAVENOUS

## 2021-04-21 MED ORDER — PHENYLEPHRINE 40 MCG/ML (10ML) SYRINGE FOR IV PUSH (FOR BLOOD PRESSURE SUPPORT)
PREFILLED_SYRINGE | INTRAVENOUS | Status: DC | PRN
  Administered 2021-04-21 (×2): 120 ug via INTRAVENOUS
  Administered 2021-04-21: 160 ug via INTRAVENOUS

## 2021-04-21 MED ORDER — SODIUM CHLORIDE 0.9 % IV SOLN: INTRAVENOUS | Status: DC | PRN

## 2021-04-21 NOTE — Anesthesia Procedure Notes (Signed)
Procedure Name: General with mask airway Date/Time: 04/21/2021 10:38 AM Performed by: Janace Litten, CRNA Pre-anesthesia Checklist: Patient identified, Emergency Drugs available, Suction available, Patient being monitored and Timeout performed Patient Re-evaluated:Patient Re-evaluated prior to induction Oxygen Delivery Method: Ambu bag Preoxygenation: Pre-oxygenation with 100% oxygen Induction Type: IV induction Ventilation: Mask ventilation without difficulty

## 2021-04-21 NOTE — Progress Notes (Signed)
RT NOTES: Pt found off bipap and on room air, no distress noted. Sats 86%, placed on 3lpm nasal cannula. Sats now 93%. Will continue to monitor.

## 2021-04-21 NOTE — Anesthesia Preprocedure Evaluation (Addendum)
Anesthesia Evaluation  Patient identified by MRN, date of birth, ID band Patient awake    Reviewed: Allergy & Precautions, NPO status , Patient's Chart, lab work & pertinent test results, reviewed documented beta blocker date and time   Airway Mallampati: II  TM Distance: >3 FB Neck ROM: Full    Dental no notable dental hx. (+) Teeth Intact, Dental Advisory Given   Pulmonary shortness of breath, with exertion and Long-Term Oxygen Therapy, sleep apnea, Continuous Positive Airway Pressure Ventilation and Oxygen sleep apnea , former smoker,  Right hemidiaphragm dysfunction Tracheobronchomalacia Quit smoking 1984   Pulmonary exam normal breath sounds clear to auscultation       Cardiovascular hypertension, Pt. on medications and Pt. on home beta blockers Normal cardiovascular exam+ dysrhythmias (xarelto) Atrial Fibrillation + pacemaker  Rhythm:Irregular Rate:Normal  Echo 04/20/21: 1. Left ventricular ejection fraction, by estimation, is 60 to 65%. The  left ventricle has normal function. The left ventricle has no regional  wall motion abnormalities. Left ventricular diastolic function could not  be evaluated.  2. Right ventricular systolic function is normal. The right ventricular  size is normal. There is normal pulmonary artery systolic pressure.  3. The mitral valve is normal in structure. Trivial mitral valve  regurgitation.  4. The aortic valve is grossly normal. There is moderate calcification of  the aortic valve. There is moderate thickening of the aortic valve. Aortic  valve regurgitation is not visualized. Mild to moderate aortic valve  sclerosis/calcification is present,  without any evidence of aortic stenosis.  5. The inferior vena cava is normal in size with <50% respiratory  variability, suggesting right atrial pressure of 8 mmHg.    Neuro/Psych PSYCHIATRIC DISORDERS Depression negative neurological ROS      GI/Hepatic Neg liver ROS, GERD  Controlled,  Endo/Other  Hypothyroidism Obesity BMI 35  Renal/GU Renal InsufficiencyRenal diseaseCr 1.43 Bladder dysfunction      Musculoskeletal  (+) Arthritis , Osteoarthritis,    Abdominal (+) + obese,   Peds  Hematology negative hematology ROS (+) hct 37.6, plt 177   Anesthesia Other Findings   Reproductive/Obstetrics negative OB ROS                            Anesthesia Physical Anesthesia Plan  ASA: 3  Anesthesia Plan: General   Post-op Pain Management:    Induction: Intravenous  PONV Risk Score and Plan: TIVA and Treatment may vary due to age or medical condition  Airway Management Planned: Natural Airway and Mask  Additional Equipment: None  Intra-op Plan:   Post-operative Plan:   Informed Consent: I have reviewed the patients History and Physical, chart, labs and discussed the procedure including the risks, benefits and alternatives for the proposed anesthesia with the patient or authorized representative who has indicated his/her understanding and acceptance.   Patient has DNR.  Suspend DNR.   Dental advisory given  Plan Discussed with: CRNA  Anesthesia Plan Comments: (DNR suspended intraoperatively )       Anesthesia Quick Evaluation

## 2021-04-21 NOTE — Progress Notes (Signed)
PROGRESS NOTE  Cindy Cervantes:[id number] DOB: 01-02-1932 DOA: 04/20/2021 PCP: Virgie Dad, MD   LOS: 1 day   Brief Narrative / Interim history: 87 year old female with history of HTN, HLD, PAF on anticoagulation, chronic respiratory failure on 2 L oxygen [place], SSS status post pacemaker, chronic diastolic CHF comes into the hospital with complaints of chest pains as well as progressive shortness of breath.  Cardiology was consulted.  On interrogation of her device she was noted to be in a flutter more prevalent in the last month, possibly explain her symptoms.  She was initially placed on Cardizem infusion, status post cardioversion on 7/20  Subjective / 24h Interval events: Doing well, seen post cardioversion, she is complaining of new onset cough, significant chest congestion, and slight complaints of shortness of breath.  Assessment & Plan: Principal Problem PAF/flutter, with RVR-patient had rates in the 130-150s on admission.  She was started on Cardizem drip and cardiology was consulted.  He was continued on anticoagulation with Xarelto and per patient she hasa dose in the last month.  She eventually underwent DCCV on 7/20 and currently she is in sinus rhythm  Active Problems New onset cough, chest congestion, underlying chronic hypoxic hypoxic respiratory failure / COPD-she is nightly BiPAP dependent and generally using 2 L of oxygen at home 24/7.  Following her DCCV when she returned to the room she has been having increased congestion, and is coughing a lot.  She has bilateral rhonchi mostly on the left lung field. ?  Brief aspiration of her saliva while down there -Provide incentive spirometry, continue nebulizers, will obtain a chest x-ray  Sick sinus syndrome status post pacemaker-battery will need to be replaced as an outpatient  CKD 3b - baseline ~1.5  Anemia of chronic disease-hemoglobin stable  Hypothyroidism-continue Synthroid TSH normal at 2.5  Obesity, class  II-BMI 35.5, she would benefit from weight loss  HLD - continue home regimen  Scheduled Meds:  allopurinol  100 mg Oral Daily   ezetimibe  10 mg Oral QPM   fluticasone  2 spray Each Nare BID   guaiFENesin  600 mg Oral BID   levothyroxine  75 mcg Oral QAC breakfast   metoprolol succinate  25 mg Oral QHS   polyethylene glycol  17 g Oral Daily   potassium chloride SA  40 mEq Oral Daily   rivaroxaban  15 mg Oral Daily   sertraline  50 mg Oral Daily   sodium chloride flush  3 mL Intravenous Q12H   torsemide  20 mg Oral Daily   Continuous Infusions: PRN Meds:.acetaminophen **OR** acetaminophen, bisacodyl, ipratropium-albuterol, ondansetron **OR** ondansetron (ZOFRAN) IV  Diet Orders (From admission, onward)     Start     Ordered   04/21/21 1410  Diet Heart Room service appropriate? Yes; Fluid consistency: Thin  Diet effective now       Question Answer Comment  Room service appropriate? Yes   Fluid consistency: Thin      04/21/21 1409            DVT prophylaxis:  Rivaroxaban (XARELTO) tablet 15 mg     Code Status: DNR  Family Communication: son at bedside   Status is: Inpatient  Remains inpatient appropriate because:Inpatient level of care appropriate due to severity of illness  Dispo: The patient is from: Home              Anticipated d/c is to: Home  Patient currently is not medically stable to d/c.   Difficult to place patient No   Level of care: Progressive  Consultants:  Cardiology   Procedures:  DCCV 7/20  Microbiology  none  Antimicrobials: none    Objective: Vitals:   04/21/21 1252 04/21/21 1258 04/21/21 1303 04/21/21 1317  BP:  (!) 100/41 (!) 108/52 (!) 101/48  Pulse:      Resp:  16    Temp: 98 F (36.7 C)     TempSrc: Oral     SpO2:  98%    Weight:      Height:        Intake/Output Summary (Last 24 hours) at 04/21/2021 1531 Last data filed at 04/21/2021 1249 Gross per 24 hour  Intake 381.63 ml  Output --  Net 381.63  ml   Filed Weights   04/20/21 0032 04/20/21 1740  Weight: 93.7 kg 93.7 kg    Examination:  Constitutional: NAD Eyes: no scleral icterus ENMT: Mucous membranes are moist.  Neck: normal, supple Respiratory: Diffuse bibasilar rhonchi, mostly on the left lung field.  Faint end -expiratory wheezing Cardiovascular: Regular rate and rhythm, no murmurs / rubs / gallops. No LE edema.  Abdomen: non distended, no tenderness. Bowel sounds positive.  Musculoskeletal: no clubbing / cyanosis.  Skin: no rashes Neurologic: non focal    Data Reviewed: I have independently reviewed following labs and imaging studies  CBC: Recent Labs  Lab 04/20/21 0135 04/20/21 1011 04/21/21 0144  WBC 9.6  --  10.7*  NEUTROABS 7.4  --   --   HGB 10.5* 11.2* 11.2*  HCT 35.5* 33.0* 37.6  MCV 105.7*  --  105.3*  PLT 188  --  354   Basic Metabolic Panel: Recent Labs  Lab 04/20/21 0135 04/20/21 0840 04/20/21 1011 04/21/21 0144  NA 141  --  141 142  K 4.6  --  5.0 4.0  CL 101  --   --  102  CO2 36*  --   --  31  GLUCOSE 126*  --   --  89  BUN 20  --   --  16  CREATININE 1.55*  --   --  1.43*  CALCIUM 8.7*  --   --  8.8*  MG  --  2.2  --   --    Liver Function Tests: Recent Labs  Lab 04/20/21 0135  AST 23  ALT 10  ALKPHOS 80  BILITOT 1.0  PROT 6.1*  ALBUMIN 3.0*   Coagulation Profile: No results for input(s): INR, PROTIME in the last 168 hours. HbA1C: No results for input(s): HGBA1C in the last 72 hours. CBG: No results for input(s): GLUCAP in the last 168 hours.  Recent Results (from the past 240 hour(s))  SARS CORONAVIRUS 2 (TAT 6-24 HRS) Nasopharyngeal Nasopharyngeal Swab     Status: None   Collection Time: 04/20/21  6:58 AM   Specimen: Nasopharyngeal Swab  Result Value Ref Range Status   SARS Coronavirus 2 NEGATIVE NEGATIVE Final    Comment: (NOTE) SARS-CoV-2 target nucleic acids are NOT DETECTED.  The SARS-CoV-2 RNA is generally detectable in upper and lower respiratory  specimens during the acute phase of infection. Negative results do not preclude SARS-CoV-2 infection, do not rule out co-infections with other pathogens, and should not be used as the sole basis for treatment or other patient management decisions. Negative results must be combined with clinical observations, patient history, and epidemiological information. The expected result is Negative.  Fact Sheet for  Patients: SugarRoll.be  Fact Sheet for Healthcare Providers: https://www.woods-mathews.com/  This test is not yet approved or cleared by the [place] FDA and  has been authorized for detection and/or diagnosis of SARS-CoV-2 by FDA under an Emergency Use Authorization (EUA). This EUA will remain  in effect (meaning this test can be used) for the duration of the COVID-19 declaration under Se ction 564(b)(1) of the Act, 21 U.S.C. section 360bbb-3(b)(1), unless the authorization is terminated or revoked sooner.  Performed at [facility], [place] [address] [place], [place] [address]      Radiology Studies: No results found.   Marzetta Board, MD, PhD [facility]  Between 7 am - 7 pm I am available, please contact me via Amion (for emergencies) or Securechat (non urgent messages)  Between 7 pm - 7 am I am not available, please contact night coverage MD/APP via Amion

## 2021-04-21 NOTE — H&P (View-Only) (Signed)
Progress Note  Patient Name: Cindy Cervantes Date of Encounter: 04/21/2021  [facility] Cardiologist: None Dr. [name]  Subjective   Feeling better.  No chest pain.  Breathing improved.   Inpatient Medications    Scheduled Meds:  allopurinol  100 mg Oral Daily   ezetimibe  10 mg Oral QPM   fluticasone  2 spray Each Nare BID   guaiFENesin  600 mg Oral BID   levothyroxine  75 mcg Oral QAC breakfast   metoprolol succinate  25 mg Oral QHS   polyethylene glycol  17 g Oral Daily   potassium chloride SA  40 mEq Oral Daily   rivaroxaban  15 mg Oral Daily   sertraline  50 mg Oral Daily   sodium chloride flush  3 mL Intravenous Q12H   torsemide  20 mg Oral Daily   Continuous Infusions:  diltiazem (CARDIZEM) infusion 10 mg/hr (04/20/21 1903)   PRN Meds: acetaminophen **OR** acetaminophen, bisacodyl, ipratropium-albuterol, ondansetron **OR** ondansetron (ZOFRAN) IV   Vital Signs    Vitals:   04/21/21 0345 04/21/21 0359 04/21/21 0734 04/21/21 0829  BP:  117/60 116/85   Pulse: 72 72 77   Resp: 17 18 19    Temp:  98.2 F (36.8 C) 98.3 F (36.8 C)   TempSrc:  Axillary Axillary   SpO2: 100% 100% 100% 93%  Weight:      Height:        Intake/Output Summary (Last 24 hours) at 04/21/2021 1012 Last data filed at 04/20/2021 1821 Gross per 24 hour  Intake 81.63 ml  Output --  Net 81.63 ml   Last 3 Weights 04/20/2021 04/20/2021 11/11/2020  Weight (lbs) 206 lb 9.1 oz 206 lb 9.1 oz 206 lb 9.6 oz  Weight (kg) 93.7 kg 93.7 kg 93.713 kg      Telemetry    Atrial flutter.  Rate <100 bpm.  - Personally Reviewed  ECG    N/a - Personally Reviewed  Physical Exam   VS:  BP 116/85 (BP Location: Right Arm)   Pulse 77   Temp 98.3 F (36.8 C) (Axillary)   Resp 19   Ht 5\' 4"  (1.626 m)   Wt 93.7 kg   SpO2 93%   BMI 35.46 kg/m  , BMI Body mass index is 35.46 kg/m. GENERAL:  Well appearing HEENT: Pupils equal round and reactive, fundi not visualized, oral mucosa unremarkable NECK:   No jugular venous distention, waveform within normal limits, carotid upstroke brisk and symmetric, no bruit LUNGS:  Clear to auscultation bilaterally HEART: Irregu PMI not displaced or sustained,S1 and S2 within normal limits, no S3, no S4, no clicks, no rubs, II/VI systolic murmur ABD:  Flat, positive bowel sounds normal in frequency in pitch, no bruits, no rebound, no guarding, no midline pulsatile mass, no hepatomegaly, no splenomegaly EXT:  2 plus pulses throughout, no edema, no cyanosis no clubbing SKIN:  No rashes no nodules NEURO:  Cranial nerves II through XII grossly intact, motor grossly intact throughout PSYCH:  Cognitively intact, oriented to person place and time  Labs    High Sensitivity Troponin:   Recent Labs  Lab 04/20/21 0135 04/20/21 0508  TROPONINIHS 11 12      Chemistry Recent Labs  Lab 04/20/21 0135 04/20/21 1011 04/21/21 0144  NA 141 141 142  K 4.6 5.0 4.0  CL 101  --  102  CO2 36*  --  31  GLUCOSE 126*  --  89  BUN 20  --  16  CREATININE 1.55*  --  1.43*  CALCIUM 8.7*  --  8.8*  PROT 6.1*  --   --   ALBUMIN 3.0*  --   --   AST 23  --   --   ALT 10  --   --   ALKPHOS 80  --   --   BILITOT 1.0  --   --   GFRNONAA 32*  --  35*  ANIONGAP 4*  --  9     Hematology Recent Labs  Lab 04/20/21 0135 04/20/21 1011 04/21/21 0144  WBC 9.6  --  10.7*  RBC 3.36*  --  3.57*  HGB 10.5* 11.2* 11.2*  HCT 35.5* 33.0* 37.6  MCV 105.7*  --  105.3*  MCH 31.3  --  31.4  MCHC 29.6*  --  29.8*  RDW 14.0  --  13.9  PLT 188  --  177    BNP Recent Labs  Lab 04/20/21 0135  BNP 183.9*     DDimer No results for input(s): DDIMER in the last 168 hours.   Radiology    CT Angio Chest PE W and/or Wo Contrast  Result Date: 04/20/2021 CLINICAL DATA:  High probability for pulmonary embolism. EXAM: CT ANGIOGRAPHY CHEST WITH CONTRAST TECHNIQUE: Multidetector CT imaging of the chest was performed using the standard protocol during bolus administration of  intravenous contrast. Multiplanar CT image reconstructions and MIPs were obtained to evaluate the vascular anatomy. CONTRAST:  7mL OMNIPAQUE IOHEXOL 350 MG/ML SOLN COMPARISON:  10/27/2010 FINDINGS: Cardiovascular: Satisfactory opacification of the pulmonary arteries to the segmental level. No evidence of pulmonary embolism. Enlarged heart. Dual-chamber pacer leads in place. Atheromatous calcification of the aorta. Mediastinum/Nodes: Negative for hematoma or visible inflammation Lungs/Pleura: Very elevated right diaphragm and generally low volume chest. The bronchus intermedius is narrowed with partial opacification. Mild atelectatic type densities at the left base. There is a band of atelectasis at the right base. No edema or consolidation Upper Abdomen: No acute finding. Peripheral gallbladder calcification similar to prior. Given the occasional crescentic morphology this is at least partially related to calculi ; there could also be some mural calcification. Musculoskeletal: No acute finding Review of the MIP images confirms the above findings. IMPRESSION: 1. Negative for pulmonary embolism. 2. Low volume chest with chronic marked elevation of the right diaphragm and atelectasis. Bronchus intermedius stenosis/collapse, likely chronic. 3. Cholelithiasis. There may also be gallbladder wall calcification. Electronically Signed   By: Monte Fantasia M.D.   On: 04/20/2021 04:34   DG Chest Portable 1 View  Result Date: 04/20/2021 CLINICAL DATA:  Left chest pain, AFib with RVR EXAM: PORTABLE CHEST 1 VIEW COMPARISON:  09/05/2012 FINDINGS: Low lung volumes. No focal consolidation. No pleural effusion or pneumothorax. Chronic eventration of right hemidiaphragm. The heart is normal in size. Thoracic aortic atherosclerosis. Left subclavian pacemaker. IMPRESSION: Low lung volumes with chronic eventration of the right hemidiaphragm. No evidence of acute cardiopulmonary disease. Electronically Signed   By: Julian Hy  M.D.   On: 04/20/2021 01:50   ECHOCARDIOGRAM COMPLETE  Result Date: 04/20/2021    ECHOCARDIOGRAM REPORT   Patient Name:   Cindy Cervantes Date of Exam: 04/20/2021 Medical Rec #:  [id number]       Height:       64.0 in Accession #:    [id number]      Weight:       206.6 lb Date of Birth:  [birth year]       BSA:  1.983 m Patient Age:    11 years        BP:           128/72 mmHg Patient Gender: F               HR:           107 bpm. Exam Location:  Inpatient Procedure: 2D Echo, Color Doppler and Cardiac Doppler Indications:    I48.91* Unspecified atrial fibrillation  History:        Patient has prior history of Echocardiogram examinations, most                 recent 12/06/2017. CHF, Pacemaker, Arrythmias:Atrial Fibrillation;                 Risk Factors:Hypertension, Dyslipidemia and Sleep Apnea.  Sonographer:    Raquel Sarna Senior RDCS Referring Phys: [id number] [place]  1. Left ventricular ejection fraction, by estimation, is 60 to 65%. The left ventricle has normal function. The left ventricle has no regional wall motion abnormalities. Left ventricular diastolic function could not be evaluated.  2. Right ventricular systolic function is normal. The right ventricular size is normal. There is normal pulmonary artery systolic pressure.  3. The mitral valve is normal in structure. Trivial mitral valve regurgitation.  4. The aortic valve is grossly normal. There is moderate calcification of the aortic valve. There is moderate thickening of the aortic valve. Aortic valve regurgitation is not visualized. Mild to moderate aortic valve sclerosis/calcification is present,  without any evidence of aortic stenosis.  5. The inferior vena cava is normal in size with <50% respiratory variability, suggesting right atrial pressure of 8 mmHg. FINDINGS  Left Ventricle: Left ventricular ejection fraction, by estimation, is 60 to 65%. The left ventricle has normal function. The left ventricle has no regional wall motion  abnormalities. The left ventricular internal cavity size was normal in size. There is  no left ventricular hypertrophy. Left ventricular diastolic function could not be evaluated due to atrial fibrillation. Left ventricular diastolic function could not be evaluated. Right Ventricle: The right ventricular size is normal. No increase in right ventricular wall thickness. Right ventricular systolic function is normal. There is normal pulmonary artery systolic pressure. The tricuspid regurgitant velocity is 2.05 m/s, and  with an assumed right atrial pressure of 8 mmHg, the estimated right ventricular systolic pressure is 18.2 mmHg. Left Atrium: Left atrial size was normal in size. Right Atrium: Right atrial size was normal in size. Pericardium: There is no evidence of pericardial effusion. Mitral Valve: The mitral valve is normal in structure. Trivial mitral valve regurgitation. Tricuspid Valve: The tricuspid valve is normal in structure. Tricuspid valve regurgitation is mild. Aortic Valve: The aortic valve is grossly normal. There is moderate calcification of the aortic valve. There is moderate thickening of the aortic valve. Aortic valve regurgitation is not visualized. Mild to moderate aortic valve sclerosis/calcification is present, without any evidence of aortic stenosis. Pulmonic Valve: The pulmonic valve was not well visualized. Pulmonic valve regurgitation is not visualized. No evidence of pulmonic stenosis. Aorta: The aortic root and ascending aorta are structurally normal, with no evidence of dilitation. Venous: The inferior vena cava is normal in size with less than 50% respiratory variability, suggesting right atrial pressure of 8 mmHg. IAS/Shunts: No atrial level shunt detected by color flow Doppler. Additional Comments: A device lead is visualized in the right ventricle.  LEFT VENTRICLE PLAX 2D LVIDd:  4.90 cm  Diastology LVIDs:         2.90 cm  LV e' medial:    6.64 cm/s LV PW:         1.10 cm  LV  E/e' medial:  19.3 LV IVS:        0.70 cm  LV e' lateral:   8.59 cm/s LVOT diam:     1.90 cm  LV E/e' lateral: 14.9 LV SV:         37 LV SV Index:   19 LVOT Area:     2.84 cm  RIGHT VENTRICLE RV S prime:     6.96 cm/s TAPSE (M-mode): 1.8 cm LEFT ATRIUM             Index       RIGHT ATRIUM           Index LA diam:        3.70 cm 1.87 cm/m  RA Area:     14.30 cm LA Vol (A2C):   48.9 ml 24.65 ml/m RA Volume:   30.20 ml  15.23 ml/m LA Vol (A4C):   46.7 ml 23.55 ml/m LA Biplane Vol: 49.0 ml 24.70 ml/m  AORTIC VALVE LVOT Vmax:   65.80 cm/s LVOT Vmean:  46.500 cm/s LVOT VTI:    0.130 m  AORTA Ao Root diam: 2.90 cm Ao Asc diam:  3.50 cm MITRAL VALVE                TRICUSPID VALVE MV Area (PHT): 3.60 cm     TR Peak grad:   16.8 mmHg MV Decel Time: 211 msec     TR Vmax:        205.00 cm/s MV E velocity: 128.00 cm/s MV A velocity: 41.40 cm/s   SHUNTS MV E/A ratio:  3.09         Systemic VTI:  0.13 m                             Systemic Diam: 1.90 cm Dani Gobble Croitoru MD Electronically signed by Sanda Klein MD Signature Date/Time: 04/20/2021/4:24:52 PM    Final    CUP PACEART REMOTE DEVICE CHECK  Result Date: 04/20/2021 Scheduled monthly (MOD) remote reviewed. Normal device function.  Battery longevity <3 months. Battery voltage 2.63V AF burden 17%, AF on presenting rhythm with onset on 03/28/21. Rates controlled. [place]- Xarelto Routing for further review and management Next remote 91 days- JBox, RN/CVRS   Cardiac Studies   Echo 04/20/21:  1. Left ventricular ejection fraction, by estimation, is 60 to 65%. The  left ventricle has normal function. The left ventricle has no regional  wall motion abnormalities. Left ventricular diastolic function could not  be evaluated.   2. Right ventricular systolic function is normal. The right ventricular  size is normal. There is normal pulmonary artery systolic pressure.   3. The mitral valve is normal in structure. Trivial mitral valve  regurgitation.   4. The aortic  valve is grossly normal. There is moderate calcification of  the aortic valve. There is moderate thickening of the aortic valve. Aortic  valve regurgitation is not visualized. Mild to moderate aortic valve  sclerosis/calcification is present,   without any evidence of aortic stenosis.   5. The inferior vena cava is normal in size with <50% respiratory  variability, suggesting right atrial pressure of 8 mmHg.   Patient Profile     87 y.o. female with  persistent atrial fibrillation, sick sinus syndrome status post pacemaker, chronic diastolic heart failure, hypertension, hyperlipidemia, OSA, and GERD admitted with atrial flutter.  Assessment & Plan    # Atrial flutter: She remains in atrial flutter.  Rates are well-controlled.  Plan for DCCV today.  She has not missed any doses of Xarelto.  Continue Xarelto.  Will see what her BP and heart rate do after DCCV before determining if she should continue diltiazem.  Discussed with Dr. [name].  If she has recurrent of atrial fibrillation/flutter soon after DCCV, would consider flecainide.  # Acute on chronic diastolic heart failure: Volume status stable.  Ins and Outs are not reported.  Continue home torsemide and metoprolol.       For questions or updates, please contact [place] Please consult [email or web address] for contact info under        Signed, Skeet Latch, MD  04/21/2021, 10:12 AM

## 2021-04-21 NOTE — Progress Notes (Signed)
Progress Note  Patient Name: Cindy Cervantes Date of Encounter: 04/21/2021  [facility] Cardiologist: None Dr. [name]  Subjective   Feeling better.  No chest pain.  Breathing improved.   Inpatient Medications    Scheduled Meds:  allopurinol  100 mg Oral Daily   ezetimibe  10 mg Oral QPM   fluticasone  2 spray Each Nare BID   guaiFENesin  600 mg Oral BID   levothyroxine  75 mcg Oral QAC breakfast   metoprolol succinate  25 mg Oral QHS   polyethylene glycol  17 g Oral Daily   potassium chloride SA  40 mEq Oral Daily   rivaroxaban  15 mg Oral Daily   sertraline  50 mg Oral Daily   sodium chloride flush  3 mL Intravenous Q12H   torsemide  20 mg Oral Daily   Continuous Infusions:  diltiazem (CARDIZEM) infusion 10 mg/hr (04/20/21 1903)   PRN Meds: acetaminophen **OR** acetaminophen, bisacodyl, ipratropium-albuterol, ondansetron **OR** ondansetron (ZOFRAN) IV   Vital Signs    Vitals:   04/21/21 0345 04/21/21 0359 04/21/21 0734 04/21/21 0829  BP:  117/60 116/85   Pulse: 72 72 77   Resp: 17 18 19    Temp:  98.2 F (36.8 C) 98.3 F (36.8 C)   TempSrc:  Axillary Axillary   SpO2: 100% 100% 100% 93%  Weight:      Height:        Intake/Output Summary (Last 24 hours) at 04/21/2021 1012 Last data filed at 04/20/2021 1821 Gross per 24 hour  Intake 81.63 ml  Output --  Net 81.63 ml   Last 3 Weights 04/20/2021 04/20/2021 11/11/2020  Weight (lbs) 206 lb 9.1 oz 206 lb 9.1 oz 206 lb 9.6 oz  Weight (kg) 93.7 kg 93.7 kg 93.713 kg      Telemetry    Atrial flutter.  Rate <100 bpm.  - Personally Reviewed  ECG    N/a - Personally Reviewed  Physical Exam   VS:  BP 116/85 (BP Location: Right Arm)   Pulse 77   Temp 98.3 F (36.8 C) (Axillary)   Resp 19   Ht 5\' 4"  (1.626 m)   Wt 93.7 kg   SpO2 93%   BMI 35.46 kg/m  , BMI Body mass index is 35.46 kg/m. GENERAL:  Well appearing HEENT: Pupils equal round and reactive, fundi not visualized, oral mucosa unremarkable NECK:   No jugular venous distention, waveform within normal limits, carotid upstroke brisk and symmetric, no bruit LUNGS:  Clear to auscultation bilaterally HEART: Irregu PMI not displaced or sustained,S1 and S2 within normal limits, no S3, no S4, no clicks, no rubs, II/VI systolic murmur ABD:  Flat, positive bowel sounds normal in frequency in pitch, no bruits, no rebound, no guarding, no midline pulsatile mass, no hepatomegaly, no splenomegaly EXT:  2 plus pulses throughout, no edema, no cyanosis no clubbing SKIN:  No rashes no nodules NEURO:  Cranial nerves II through XII grossly intact, motor grossly intact throughout PSYCH:  Cognitively intact, oriented to person place and time  Labs    High Sensitivity Troponin:   Recent Labs  Lab 04/20/21 0135 04/20/21 0508  TROPONINIHS 11 12      Chemistry Recent Labs  Lab 04/20/21 0135 04/20/21 1011 04/21/21 0144  NA 141 141 142  K 4.6 5.0 4.0  CL 101  --  102  CO2 36*  --  31  GLUCOSE 126*  --  89  BUN 20  --  16  CREATININE 1.55*  --  1.43*  CALCIUM 8.7*  --  8.8*  PROT 6.1*  --   --   ALBUMIN 3.0*  --   --   AST 23  --   --   ALT 10  --   --   ALKPHOS 80  --   --   BILITOT 1.0  --   --   GFRNONAA 32*  --  35*  ANIONGAP 4*  --  9     Hematology Recent Labs  Lab 04/20/21 0135 04/20/21 1011 04/21/21 0144  WBC 9.6  --  10.7*  RBC 3.36*  --  3.57*  HGB 10.5* 11.2* 11.2*  HCT 35.5* 33.0* 37.6  MCV 105.7*  --  105.3*  MCH 31.3  --  31.4  MCHC 29.6*  --  29.8*  RDW 14.0  --  13.9  PLT 188  --  177    BNP Recent Labs  Lab 04/20/21 0135  BNP 183.9*     DDimer No results for input(s): DDIMER in the last 168 hours.   Radiology    CT Angio Chest PE W and/or Wo Contrast  Result Date: 04/20/2021 CLINICAL DATA:  High probability for pulmonary embolism. EXAM: CT ANGIOGRAPHY CHEST WITH CONTRAST TECHNIQUE: Multidetector CT imaging of the chest was performed using the standard protocol during bolus administration of  intravenous contrast. Multiplanar CT image reconstructions and MIPs were obtained to evaluate the vascular anatomy. CONTRAST:  46mL OMNIPAQUE IOHEXOL 350 MG/ML SOLN COMPARISON:  10/27/2010 FINDINGS: Cardiovascular: Satisfactory opacification of the pulmonary arteries to the segmental level. No evidence of pulmonary embolism. Enlarged heart. Dual-chamber pacer leads in place. Atheromatous calcification of the aorta. Mediastinum/Nodes: Negative for hematoma or visible inflammation Lungs/Pleura: Very elevated right diaphragm and generally low volume chest. The bronchus intermedius is narrowed with partial opacification. Mild atelectatic type densities at the left base. There is a band of atelectasis at the right base. No edema or consolidation Upper Abdomen: No acute finding. Peripheral gallbladder calcification similar to prior. Given the occasional crescentic morphology this is at least partially related to calculi ; there could also be some mural calcification. Musculoskeletal: No acute finding Review of the MIP images confirms the above findings. IMPRESSION: 1. Negative for pulmonary embolism. 2. Low volume chest with chronic marked elevation of the right diaphragm and atelectasis. Bronchus intermedius stenosis/collapse, likely chronic. 3. Cholelithiasis. There may also be gallbladder wall calcification. Electronically Signed   By: Monte Fantasia M.D.   On: 04/20/2021 04:34   DG Chest Portable 1 View  Result Date: 04/20/2021 CLINICAL DATA:  Left chest pain, AFib with RVR EXAM: PORTABLE CHEST 1 VIEW COMPARISON:  09/05/2012 FINDINGS: Low lung volumes. No focal consolidation. No pleural effusion or pneumothorax. Chronic eventration of right hemidiaphragm. The heart is normal in size. Thoracic aortic atherosclerosis. Left subclavian pacemaker. IMPRESSION: Low lung volumes with chronic eventration of the right hemidiaphragm. No evidence of acute cardiopulmonary disease. Electronically Signed   By: Julian Hy  M.D.   On: 04/20/2021 01:50   ECHOCARDIOGRAM COMPLETE  Result Date: 04/20/2021    ECHOCARDIOGRAM REPORT   Patient Name:   Cindy Cervantes Date of Exam: 04/20/2021 Medical Rec #:  [id number]       Height:       64.0 in Accession #:    [id number]      Weight:       206.6 lb Date of Birth:  October 15, 1931       BSA:  1.983 m Patient Age:    87 years        BP:           128/72 mmHg Patient Gender: F               HR:           107 bpm. Exam Location:  Inpatient Procedure: 2D Echo, Color Doppler and Cardiac Doppler Indications:    I48.91* Unspecified atrial fibrillation  History:        Patient has prior history of Echocardiogram examinations, most                 recent 12/06/2017. CHF, Pacemaker, Arrythmias:Atrial Fibrillation;                 Risk Factors:Hypertension, Dyslipidemia and Sleep Apnea.  Sonographer:    Raquel Sarna Senior RDCS Referring Phys: [id number] [place]  1. Left ventricular ejection fraction, by estimation, is 60 to 65%. The left ventricle has normal function. The left ventricle has no regional wall motion abnormalities. Left ventricular diastolic function could not be evaluated.  2. Right ventricular systolic function is normal. The right ventricular size is normal. There is normal pulmonary artery systolic pressure.  3. The mitral valve is normal in structure. Trivial mitral valve regurgitation.  4. The aortic valve is grossly normal. There is moderate calcification of the aortic valve. There is moderate thickening of the aortic valve. Aortic valve regurgitation is not visualized. Mild to moderate aortic valve sclerosis/calcification is present,  without any evidence of aortic stenosis.  5. The inferior vena cava is normal in size with <50% respiratory variability, suggesting right atrial pressure of 8 mmHg. FINDINGS  Left Ventricle: Left ventricular ejection fraction, by estimation, is 60 to 65%. The left ventricle has normal function. The left ventricle has no regional wall motion  abnormalities. The left ventricular internal cavity size was normal in size. There is  no left ventricular hypertrophy. Left ventricular diastolic function could not be evaluated due to atrial fibrillation. Left ventricular diastolic function could not be evaluated. Right Ventricle: The right ventricular size is normal. No increase in right ventricular wall thickness. Right ventricular systolic function is normal. There is normal pulmonary artery systolic pressure. The tricuspid regurgitant velocity is 2.05 m/s, and  with an assumed right atrial pressure of 8 mmHg, the estimated right ventricular systolic pressure is 41.9 mmHg. Left Atrium: Left atrial size was normal in size. Right Atrium: Right atrial size was normal in size. Pericardium: There is no evidence of pericardial effusion. Mitral Valve: The mitral valve is normal in structure. Trivial mitral valve regurgitation. Tricuspid Valve: The tricuspid valve is normal in structure. Tricuspid valve regurgitation is mild. Aortic Valve: The aortic valve is grossly normal. There is moderate calcification of the aortic valve. There is moderate thickening of the aortic valve. Aortic valve regurgitation is not visualized. Mild to moderate aortic valve sclerosis/calcification is present, without any evidence of aortic stenosis. Pulmonic Valve: The pulmonic valve was not well visualized. Pulmonic valve regurgitation is not visualized. No evidence of pulmonic stenosis. Aorta: The aortic root and ascending aorta are structurally normal, with no evidence of dilitation. Venous: The inferior vena cava is normal in size with less than 50% respiratory variability, suggesting right atrial pressure of 8 mmHg. IAS/Shunts: No atrial level shunt detected by color flow Doppler. Additional Comments: A device lead is visualized in the right ventricle.  LEFT VENTRICLE PLAX 2D LVIDd:  4.90 cm  Diastology LVIDs:         2.90 cm  LV e' medial:    6.64 cm/s LV PW:         1.10 cm  LV  E/e' medial:  19.3 LV IVS:        0.70 cm  LV e' lateral:   8.59 cm/s LVOT diam:     1.90 cm  LV E/e' lateral: 14.9 LV SV:         37 LV SV Index:   19 LVOT Area:     2.84 cm  RIGHT VENTRICLE RV S prime:     6.96 cm/s TAPSE (M-mode): 1.8 cm LEFT ATRIUM             Index       RIGHT ATRIUM           Index LA diam:        3.70 cm 1.87 cm/m  RA Area:     14.30 cm LA Vol (A2C):   48.9 ml 24.65 ml/m RA Volume:   30.20 ml  15.23 ml/m LA Vol (A4C):   46.7 ml 23.55 ml/m LA Biplane Vol: 49.0 ml 24.70 ml/m  AORTIC VALVE LVOT Vmax:   65.80 cm/s LVOT Vmean:  46.500 cm/s LVOT VTI:    0.130 m  AORTA Ao Root diam: 2.90 cm Ao Asc diam:  3.50 cm MITRAL VALVE                TRICUSPID VALVE MV Area (PHT): 3.60 cm     TR Peak grad:   16.8 mmHg MV Decel Time: 211 msec     TR Vmax:        205.00 cm/s MV E velocity: 128.00 cm/s MV A velocity: 41.40 cm/s   SHUNTS MV E/A ratio:  3.09         Systemic VTI:  0.13 m                             Systemic Diam: 1.90 cm Dani Gobble Croitoru MD Electronically signed by Sanda Klein MD Signature Date/Time: 04/20/2021/4:24:52 PM    Final    CUP PACEART REMOTE DEVICE CHECK  Result Date: 04/20/2021 Scheduled monthly (MOD) remote reviewed. Normal device function.  Battery longevity <3 months. Battery voltage 2.63V AF burden 17%, AF on presenting rhythm with onset on 03/28/21. Rates controlled. [place]- Xarelto Routing for further review and management Next remote 91 days- [employer], RN/CVRS   Cardiac Studies   Echo 04/20/21:  1. Left ventricular ejection fraction, by estimation, is 60 to 65%. The  left ventricle has normal function. The left ventricle has no regional  wall motion abnormalities. Left ventricular diastolic function could not  be evaluated.   2. Right ventricular systolic function is normal. The right ventricular  size is normal. There is normal pulmonary artery systolic pressure.   3. The mitral valve is normal in structure. Trivial mitral valve  regurgitation.   4. The aortic  valve is grossly normal. There is moderate calcification of  the aortic valve. There is moderate thickening of the aortic valve. Aortic  valve regurgitation is not visualized. Mild to moderate aortic valve  sclerosis/calcification is present,   without any evidence of aortic stenosis.   5. The inferior vena cava is normal in size with <50% respiratory  variability, suggesting right atrial pressure of 8 mmHg.   Patient Profile     87 y.o. female with  persistent atrial fibrillation, sick sinus syndrome status post pacemaker, chronic diastolic heart failure, hypertension, hyperlipidemia, OSA, and GERD admitted with atrial flutter.  Assessment & Plan    # Atrial flutter: She remains in atrial flutter.  Rates are well-controlled.  Plan for DCCV today.  She has not missed any doses of Xarelto.  Continue Xarelto.  Will see what her BP and heart rate do after DCCV before determining if she should continue diltiazem.  Discussed with Dr. [name].  If she has recurrent of atrial fibrillation/flutter soon after DCCV, would consider flecainide.  # Acute on chronic diastolic heart failure: Volume status stable.  Ins and Outs are not reported.  Continue home torsemide and metoprolol.       For questions or updates, please contact [place] Please consult [email or web address] for contact info under        Signed, Skeet Latch, MD  04/21/2021, 10:12 AM

## 2021-04-21 NOTE — Anesthesia Postprocedure Evaluation (Signed)
Anesthesia Post Note  Patient: Cindy Cervantes  Procedure(s) Performed: CARDIOVERSION     Patient location during evaluation: PACU Anesthesia Type: General Level of consciousness: awake and alert, oriented and patient cooperative Pain management: pain level controlled Vital Signs Assessment: post-procedure vital signs reviewed and stable Respiratory status: spontaneous breathing, nonlabored ventilation and respiratory function stable Cardiovascular status: blood pressure returned to baseline and stable Postop Assessment: no apparent nausea or vomiting Anesthetic complications: no   No notable events documented.  Last Vitals:  Vitals:   04/21/21 1303 04/21/21 1317  BP: (!) 108/52 (!) 101/48  Pulse:    Resp:    Temp:    SpO2:      Last Pain:  Vitals:   04/21/21 1317  TempSrc:   PainSc: 0-No pain                 Pervis Hocking

## 2021-04-21 NOTE — Interval H&P Note (Signed)
History and Physical Interval Note:  04/21/2021 12:33 PM  Cindy Cervantes  has presented today for surgery, with the diagnosis of AFLUTTER.  The various methods of treatment have been discussed with the patient and family. After consideration of risks, benefits and other options for treatment, the patient has consented to  Procedure(s): CARDIOVERSION (N/A) as a surgical intervention.  The patient's history has been reviewed, patient examined, no change in status, stable for surgery.  I have reviewed the patient's chart and labs.  Questions were answered to the patient's satisfaction.     [employer]

## 2021-04-21 NOTE — CV Procedure (Signed)
    Electrical Cardioversion Procedure Note Cindy Cervantes [id number] 1932/09/30  Procedure: Electrical Cardioversion Indications:  Atrial Fibrillation  Time Out: Verified patient identification, verified procedure,medications/allergies/relevent history reviewed, required imaging and test results available.  Performed  Procedure Details  The patient was NPO after midnight. Anesthesia was administered at the beside  by Dr.Finucane with propofol.  Cardioversion was performed with synchronized biphasic defibrillation via AP pads with 120 joules.  1 attempt(s) were performed.  The patient converted to normal sinus rhythm. The patient tolerated the procedure well   IMPRESSION:  Successful cardioversion of atrial fibrillation/flutter    Cindy Cervantes 04/21/2021, 12:47 PM

## 2021-04-21 NOTE — Transfer of Care (Signed)
Immediate Anesthesia Transfer of Care Note  Patient: Cindy Cervantes  Procedure(s) Performed: CARDIOVERSION  Patient Location: PACU and Endoscopy Unit  Anesthesia Type:General  Level of Consciousness: drowsy, patient cooperative and responds to stimulation  Airway & Oxygen Therapy: Patient Spontanous Breathing and Patient connected to nasal cannula oxygen  Post-op Assessment: Report given to RN and Post -op Vital signs reviewed and stable  Post vital signs: Reviewed and stable  Last Vitals:  Vitals Value Taken Time  BP    Temp    Pulse 45 04/21/21 1250  Resp 12 04/21/21 1250  SpO2 98 % 04/21/21 1250    Last Pain:  Vitals:   04/21/21 1143  TempSrc: Oral  PainSc: 0-No pain         Complications: No notable events documented.

## 2021-04-22 DIAGNOSIS — Z7901 Long term (current) use of anticoagulants: Secondary | ICD-10-CM | POA: Diagnosis not present

## 2021-04-22 DIAGNOSIS — I5032 Chronic diastolic (congestive) heart failure: Secondary | ICD-10-CM | POA: Diagnosis not present

## 2021-04-22 DIAGNOSIS — G4733 Obstructive sleep apnea (adult) (pediatric): Secondary | ICD-10-CM | POA: Diagnosis not present

## 2021-04-22 DIAGNOSIS — I483 Typical atrial flutter: Secondary | ICD-10-CM | POA: Diagnosis not present

## 2021-04-22 MED ORDER — DILTIAZEM HCL ER 60 MG PO CP12
60.0000 mg | ORAL_CAPSULE | Freq: Two times a day (BID) | ORAL | Status: DC
  Administered 2021-04-22 – 2021-04-26 (×9): 60 mg via ORAL
  Filled 2021-04-22 (×11): qty 1

## 2021-04-22 MED ORDER — FLECAINIDE ACETATE 50 MG PO TABS
150.0000 mg | ORAL_TABLET | Freq: Once | ORAL | Status: AC
  Administered 2021-04-22: 150 mg via ORAL
  Filled 2021-04-22: qty 3

## 2021-04-22 MED ORDER — FLECAINIDE ACETATE 50 MG PO TABS
50.0000 mg | ORAL_TABLET | Freq: Two times a day (BID) | ORAL | Status: DC
  Administered 2021-04-22 – 2021-04-26 (×8): 50 mg via ORAL
  Filled 2021-04-22 (×8): qty 1

## 2021-04-22 NOTE — Care Management Important Message (Signed)
Important Message  Patient Details  Name: Cindy Cervantes MRN: [id number] Date of Birth: [birth year]   Medicare Important Message Given:  Yes     Orbie Pyo 04/22/2021, 4:28 PM

## 2021-04-22 NOTE — Progress Notes (Signed)
Progress Note  Patient Name: Cindy Cervantes Date of Encounter: 04/22/2021  [facility] Cardiologist: None Dr. [name]  Subjective   Feeling better.  No chest pain.  Breathing improved. Notes some unsettling hallucinations overnight.  Inpatient Medications    Scheduled Meds:  allopurinol  100 mg Oral Daily   diltiazem  60 mg Oral Q12H   ezetimibe  10 mg Oral QPM   flecainide  150 mg Oral Once   flecainide  50 mg Oral Q12H   fluticasone  2 spray Each Nare BID   guaiFENesin  600 mg Oral BID   levothyroxine  75 mcg Oral QAC breakfast   metoprolol succinate  25 mg Oral QHS   polyethylene glycol  17 g Oral Daily   potassium chloride SA  40 mEq Oral Daily   rivaroxaban  15 mg Oral Daily   sertraline  50 mg Oral Daily   sodium chloride flush  3 mL Intravenous Q12H   torsemide  20 mg Oral Daily   Continuous Infusions:   PRN Meds: acetaminophen **OR** acetaminophen, bisacodyl, ipratropium-albuterol, ondansetron **OR** ondansetron (ZOFRAN) IV   Vital Signs    Vitals:   04/21/21 2349 04/22/21 0000 04/22/21 0402 04/22/21 0743  BP: 108/64 (!) 107/56 132/89 117/62  Pulse: 82 87 90 95  Resp: 15 17 20 19   Temp: 98.3 F (36.8 C)  98.5 F (36.9 C) 98.2 F (36.8 C)  TempSrc: Axillary  Axillary Oral  SpO2: 99% 100% 100% 99%  Weight:      Height:        Intake/Output Summary (Last 24 hours) at 04/22/2021 0952 Last data filed at 04/21/2021 2024 Gross per 24 hour  Intake 445 ml  Output --  Net 445 ml   Last 3 Weights 04/20/2021 04/20/2021 11/11/2020  Weight (lbs) 206 lb 9.1 oz 206 lb 9.1 oz 206 lb 9.6 oz  Weight (kg) 93.7 kg 93.7 kg 93.713 kg      Telemetry    Sinus rhythm --> Atrial flutter at 6: 30 pm.  Rate mostly <100 bpm.  - Personally Reviewed  ECG    N/a - Personally Reviewed  Physical Exam   VS:  BP 117/62 (BP Location: Right Arm)   Pulse 95   Temp 98.2 F (36.8 C) (Oral)   Resp 19   Ht 5\' 4"  (1.626 m)   Wt 93.7 kg   SpO2 99%   BMI 35.46 kg/m  , BMI  Body mass index is 35.46 kg/m. GENERAL:  Well appearing HEENT: Pupils equal round and reactive, fundi not visualized, oral mucosa unremarkable NECK:  No jugular venous distention, waveform within normal limits, carotid upstroke brisk and symmetric, no bruit LUNGS:  Clear to auscultation bilaterally HEART: Irregularly irregular.  PMI not displaced or sustained,S1 and S2 within normal limits, no S3, no S4, no clicks, no rubs, II/VI systolic murmur ABD:  Flat, positive bowel sounds normal in frequency in pitch, no bruits, no rebound, no guarding, no midline pulsatile mass, no hepatomegaly, no splenomegaly EXT:  2 plus pulses throughout, no edema, no cyanosis no clubbing SKIN:  No rashes no nodules NEURO:  Cranial nerves II through XII grossly intact, motor grossly intact throughout PSYCH:  Cognitively intact, oriented to person place and time  Labs    High Sensitivity Troponin:   Recent Labs  Lab 04/20/21 0135 04/20/21 0508  TROPONINIHS 11 12      Chemistry Recent Labs  Lab 04/20/21 0135 04/20/21 1011 04/21/21 0144  NA 141 141 142  K 4.6 5.0 4.0  CL 101  --  102  CO2 36*  --  31  GLUCOSE 126*  --  89  BUN 20  --  16  CREATININE 1.55*  --  1.43*  CALCIUM 8.7*  --  8.8*  PROT 6.1*  --   --   ALBUMIN 3.0*  --   --   AST 23  --   --   ALT 10  --   --   ALKPHOS 80  --   --   BILITOT 1.0  --   --   GFRNONAA 32*  --  35*  ANIONGAP 4*  --  9     Hematology Recent Labs  Lab 04/20/21 0135 04/20/21 1011 04/21/21 0144  WBC 9.6  --  10.7*  RBC 3.36*  --  3.57*  HGB 10.5* 11.2* 11.2*  HCT 35.5* 33.0* 37.6  MCV 105.7*  --  105.3*  MCH 31.3  --  31.4  MCHC 29.6*  --  29.8*  RDW 14.0  --  13.9  PLT 188  --  177    BNP Recent Labs  Lab 04/20/21 0135  BNP 183.9*     DDimer No results for input(s): DDIMER in the last 168 hours.   Radiology    DG CHEST PORT 1 VIEW  Result Date: 04/21/2021 CLINICAL DATA:  Cough and congestion following cardioversion EXAM: PORTABLE  CHEST 1 VIEW COMPARISON:  04/20/2021 FINDINGS: Cardiac shadow is stable. Pacemaker is again seen and stable. Aortic calcifications are noted. Elevation of the right hemidiaphragm is noted. Mild bibasilar atelectasis is seen. No acute bony abnormality is noted. IMPRESSION: Mild bibasilar atelectasis.  No other focal abnormality is seen. Electronically Signed   By: Inez Catalina M.D.   On: 04/21/2021 16:42   ECHOCARDIOGRAM COMPLETE  Result Date: 04/20/2021    ECHOCARDIOGRAM REPORT   Patient Name:   Cindy Cervantes Date of Exam: 04/20/2021 Medical Rec #:  [id number]       Height:       64.0 in Accession #:    [id number]      Weight:       206.6 lb Date of Birth:  06-Apr-1932       BSA:          1.983 m Patient Age:    87 years        BP:           128/72 mmHg Patient Gender: F               HR:           107 bpm. Exam Location:  Inpatient Procedure: 2D Echo, Color Doppler and Cardiac Doppler Indications:    I48.91* Unspecified atrial fibrillation  History:        Patient has prior history of Echocardiogram examinations, most                 recent 12/06/2017. CHF, Pacemaker, Arrythmias:Atrial Fibrillation;                 Risk Factors:Hypertension, Dyslipidemia and Sleep Apnea.  Sonographer:    Raquel Sarna Senior RDCS Referring Phys: [id number] Barrett  1. Left ventricular ejection fraction, by estimation, is 60 to 65%. The left ventricle has normal function. The left ventricle has no regional wall motion abnormalities. Left ventricular diastolic function could not be evaluated.  2. Right ventricular systolic function is normal. The right ventricular size is normal. There is normal  pulmonary artery systolic pressure.  3. The mitral valve is normal in structure. Trivial mitral valve regurgitation.  4. The aortic valve is grossly normal. There is moderate calcification of the aortic valve. There is moderate thickening of the aortic valve. Aortic valve regurgitation is not visualized. Mild to moderate aortic valve  sclerosis/calcification is present,  without any evidence of aortic stenosis.  5. The inferior vena cava is normal in size with <50% respiratory variability, suggesting right atrial pressure of 8 mmHg. FINDINGS  Left Ventricle: Left ventricular ejection fraction, by estimation, is 60 to 65%. The left ventricle has normal function. The left ventricle has no regional wall motion abnormalities. The left ventricular internal cavity size was normal in size. There is  no left ventricular hypertrophy. Left ventricular diastolic function could not be evaluated due to atrial fibrillation. Left ventricular diastolic function could not be evaluated. Right Ventricle: The right ventricular size is normal. No increase in right ventricular wall thickness. Right ventricular systolic function is normal. There is normal pulmonary artery systolic pressure. The tricuspid regurgitant velocity is 2.05 m/s, and  with an assumed right atrial pressure of 8 mmHg, the estimated right ventricular systolic pressure is 09.4 mmHg. Left Atrium: Left atrial size was normal in size. Right Atrium: Right atrial size was normal in size. Pericardium: There is no evidence of pericardial effusion. Mitral Valve: The mitral valve is normal in structure. Trivial mitral valve regurgitation. Tricuspid Valve: The tricuspid valve is normal in structure. Tricuspid valve regurgitation is mild. Aortic Valve: The aortic valve is grossly normal. There is moderate calcification of the aortic valve. There is moderate thickening of the aortic valve. Aortic valve regurgitation is not visualized. Mild to moderate aortic valve sclerosis/calcification is present, without any evidence of aortic stenosis. Pulmonic Valve: The pulmonic valve was not well visualized. Pulmonic valve regurgitation is not visualized. No evidence of pulmonic stenosis. Aorta: The aortic root and ascending aorta are structurally normal, with no evidence of dilitation. Venous: The inferior vena cava is  normal in size with less than 50% respiratory variability, suggesting right atrial pressure of 8 mmHg. IAS/Shunts: No atrial level shunt detected by color flow Doppler. Additional Comments: A device lead is visualized in the right ventricle.  LEFT VENTRICLE PLAX 2D LVIDd:         4.90 cm  Diastology LVIDs:         2.90 cm  LV e' medial:    6.64 cm/s LV PW:         1.10 cm  LV E/e' medial:  19.3 LV IVS:        0.70 cm  LV e' lateral:   8.59 cm/s LVOT diam:     1.90 cm  LV E/e' lateral: 14.9 LV SV:         37 LV SV Index:   19 LVOT Area:     2.84 cm  RIGHT VENTRICLE RV S prime:     6.96 cm/s TAPSE (M-mode): 1.8 cm LEFT ATRIUM             Index       RIGHT ATRIUM           Index LA diam:        3.70 cm 1.87 cm/m  RA Area:     14.30 cm LA Vol (A2C):   48.9 ml 24.65 ml/m RA Volume:   30.20 ml  15.23 ml/m LA Vol (A4C):   46.7 ml 23.55 ml/m LA Biplane Vol: 49.0 ml 24.70 ml/m  AORTIC VALVE LVOT Vmax:   65.80 cm/s LVOT Vmean:  46.500 cm/s LVOT VTI:    0.130 m  AORTA Ao Root diam: 2.90 cm Ao Asc diam:  3.50 cm MITRAL VALVE                TRICUSPID VALVE MV Area (PHT): 3.60 cm     TR Peak grad:   16.8 mmHg MV Decel Time: 211 msec     TR Vmax:        205.00 cm/s MV E velocity: 128.00 cm/s MV A velocity: 41.40 cm/s   SHUNTS MV E/A ratio:  3.09         Systemic VTI:  0.13 m                             Systemic Diam: 1.90 cm Sanda Klein MD Electronically signed by Sanda Klein MD Signature Date/Time: 04/20/2021/4:24:52 PM    Final     Cardiac Studies   Echo 04/20/21:  1. Left ventricular ejection fraction, by estimation, is 60 to 65%. The  left ventricle has normal function. The left ventricle has no regional  wall motion abnormalities. Left ventricular diastolic function could not  be evaluated.   2. Right ventricular systolic function is normal. The right ventricular  size is normal. There is normal pulmonary artery systolic pressure.   3. The mitral valve is normal in structure. Trivial mitral valve   regurgitation.   4. The aortic valve is grossly normal. There is moderate calcification of  the aortic valve. There is moderate thickening of the aortic valve. Aortic  valve regurgitation is not visualized. Mild to moderate aortic valve  sclerosis/calcification is present,   without any evidence of aortic stenosis.   5. The inferior vena cava is normal in size with <50% respiratory  variability, suggesting right atrial pressure of 8 mmHg.   Patient Profile     87 y.o. female with persistent atrial fibrillation, sick sinus syndrome status post pacemaker, chronic diastolic heart failure, hypertension, hyperlipidemia, OSA, and GERD admitted with atrial flutter.  Assessment & Plan    # Atrial flutter: Ms. Cannedy underwent successful cardioversion yesterday.  However, she was hospitalized about atrial flutter.  We will give her a dose of flecainide 150 mg twice daily.  We will then continue with 50 mg twice daily.  She previously developed tremors on amiodarone, though after discussion with Dr. [name], it is not clear that her amiodarone.  Her diltiazem drip was discontinued.  Cardioversion yesterday.  We will start her on 50 mg every 12 and continue metoprolol.  She has not missed any doses of her anticoagulant.  She has been having a lot of difficulty sleeping and some hallucinations.  Rates are controlled, we will plan for outpatient atrial fibrillation clinic, at which time she can be scheduled for an outpatient cardioversion if necessary.  Given that her heart rates were well-controlled and atrial flutter on admission, I suspect that she will develop recurrent heart failure if we leave her in atrial flutter.  We will get a Lexiscan Myoview tomorrow to assess for ischemia given that we are starting her on flecainide.  Potassium and magnesium levels have been stable.  # Acute on chronic diastolic heart failure: Volume status stable.  Ins and Outs are not reported.  Continue home torsemide and  metoprolol.       For questions or updates, please contact [place] Please consult [email or web address] for contact  info under        Signed, Skeet Latch, MD  04/22/2021, 9:52 AM

## 2021-04-22 NOTE — Plan of Care (Signed)

## 2021-04-22 NOTE — Progress Notes (Signed)
PROGRESS NOTE  Cindy Cervantes AVW:[id number] DOB: 1932-08-14 DOA: 04/20/2021 PCP: Virgie Dad, MD   LOS: 2 days   Brief Narrative / Interim history: 87 year old female with history of HTN, HLD, PAF on anticoagulation, chronic respiratory failure on 2 L oxygen [place], SSS status post pacemaker, chronic diastolic CHF comes into the hospital with complaints of chest pains as well as progressive shortness of breath.  Cardiology was consulted.  On interrogation of her device she was noted to be in a flutter more prevalent in the last month, possibly explain her symptoms.  She was initially placed on Cardizem infusion, status post cardioversion on 7/20, a flutter recurred 7/21  Subjective / 24h Interval events: No complaints this morning, denies any chest pain, denies any shortness of breath more so than her baseline.  No abdominal pain, no nausea or vomiting  Assessment & Plan: Principal Problem PAF/flutter, with RVR-patient had rates in the 130-150s on admission.  She was started on Cardizem drip and cardiology was consulted.  He was continued on anticoagulation with Xarelto and per patient she has not missed a dose in the last month.  She eventually underwent DCCV on 7/20, however her a flutter recurred 7/21.  She is to be added on flecainide per cardiology today, and get a stress test tomorrow  Active Problems New onset cough, chest congestion, underlying chronic hypoxic hypoxic respiratory failure / COPD-she is nightly BiPAP dependent and generally using 2 L of oxygen at home 24/7.  Following her DCCV when she returned to the room she has been having increased congestion, and is coughing a lot.  She has bilateral rhonchi mostly on the left lung field. ?  Brief aspiration of her saliva while down there -Chest x-ray unremarkable, wheezing has resolved this morning.  Respiratory status is back to baseline  Sick sinus syndrome status post pacemaker-battery will need to be replaced as an  outpatient  CKD 3b -her creatinine is at baseline this morning at 1.4  Anemia of chronic disease-hemoglobin stable  Hypothyroidism-continue Synthroid TSH normal at 2.5  Obesity, class II-BMI 35.5, she would benefit from weight loss  HLD - continue home regimen  Scheduled Meds:  allopurinol  100 mg Oral Daily   diltiazem  60 mg Oral Q12H   ezetimibe  10 mg Oral QPM   flecainide  150 mg Oral Once   flecainide  50 mg Oral Q12H   fluticasone  2 spray Each Nare BID   guaiFENesin  600 mg Oral BID   levothyroxine  75 mcg Oral QAC breakfast   metoprolol succinate  25 mg Oral QHS   polyethylene glycol  17 g Oral Daily   potassium chloride SA  40 mEq Oral Daily   rivaroxaban  15 mg Oral Daily   sertraline  50 mg Oral Daily   sodium chloride flush  3 mL Intravenous Q12H   torsemide  20 mg Oral Daily   Continuous Infusions: PRN Meds:.acetaminophen **OR** acetaminophen, bisacodyl, ipratropium-albuterol, ondansetron **OR** ondansetron (ZOFRAN) IV  Diet Orders (From admission, onward)     Start     Ordered   04/23/21 0001  Diet NPO time specified  Diet effective midnight        04/22/21 0958   04/21/21 1410  Diet Heart Room service appropriate? Yes; Fluid consistency: Thin  Diet effective now       Question Answer Comment  Room service appropriate? Yes   Fluid consistency: Thin      04/21/21 1409  DVT prophylaxis:  Rivaroxaban (XARELTO) tablet 15 mg     Code Status: DNR  Family Communication: No family at bedside  Status is: Inpatient  Remains inpatient appropriate because:Inpatient level of care appropriate due to severity of illness  Dispo: The patient is from: Home              Anticipated d/c is to: Home              Patient currently is not medically stable to d/c.   Difficult to place patient No   Level of care: Progressive  Consultants:  Cardiology   Procedures:  DCCV 7/20  Microbiology  none  Antimicrobials: none     Objective: Vitals:   04/21/21 2349 04/22/21 0000 04/22/21 0402 04/22/21 0743  BP: 108/64 (!) 107/56 132/89 117/62  Pulse: 82 87 90 95  Resp: 15 17 20 19   Temp: 98.3 F (36.8 C)  98.5 F (36.9 C) 98.2 F (36.8 C)  TempSrc: Axillary  Axillary Oral  SpO2: 99% 100% 100% 99%  Weight:      Height:        Intake/Output Summary (Last 24 hours) at 04/22/2021 1001 Last data filed at 04/21/2021 2024 Gross per 24 hour  Intake 445 ml  Output --  Net 445 ml    Filed Weights   04/20/21 0032 04/20/21 1740  Weight: 93.7 kg 93.7 kg    Examination:  Constitutional: She is in no distress Eyes: No scleral icterus ENMT: mmm Neck: normal, supple Respiratory: No wheezing, overall distant breath sounds.  Moves air well Cardiovascular: Irregularly irregular, no peripheral edema Abdomen: Soft, NT, ND, bowel sounds positive Musculoskeletal: no clubbing / cyanosis.  Skin: No rashes seen Neurologic: No focal deficits   Data Reviewed: I have independently reviewed following labs and imaging studies  CBC: Recent Labs  Lab 04/20/21 0135 04/20/21 1011 04/21/21 0144  WBC 9.6  --  10.7*  NEUTROABS 7.4  --   --   HGB 10.5* 11.2* 11.2*  HCT 35.5* 33.0* 37.6  MCV 105.7*  --  105.3*  PLT 188  --  650    Basic Metabolic Panel: Recent Labs  Lab 04/20/21 0135 04/20/21 0840 04/20/21 1011 04/21/21 0144  NA 141  --  141 142  K 4.6  --  5.0 4.0  CL 101  --   --  102  CO2 36*  --   --  31  GLUCOSE 126*  --   --  89  BUN 20  --   --  16  CREATININE 1.55*  --   --  1.43*  CALCIUM 8.7*  --   --  8.8*  MG  --  2.2  --   --     Liver Function Tests: Recent Labs  Lab 04/20/21 0135  AST 23  ALT 10  ALKPHOS 80  BILITOT 1.0  PROT 6.1*  ALBUMIN 3.0*    Coagulation Profile: No results for input(s): INR, PROTIME in the last 168 hours. HbA1C: No results for input(s): HGBA1C in the last 72 hours. CBG: No results for input(s): GLUCAP in the last 168 hours.  Recent Results (from  the past 240 hour(s))  SARS CORONAVIRUS 2 (TAT 6-24 HRS) Nasopharyngeal Nasopharyngeal Swab     Status: None   Collection Time: 04/20/21  6:58 AM   Specimen: Nasopharyngeal Swab  Result Value Ref Range Status   SARS Coronavirus 2 NEGATIVE NEGATIVE Final    Comment: (NOTE) SARS-CoV-2 target nucleic acids are NOT DETECTED.  The SARS-CoV-2 RNA is generally detectable in upper and lower respiratory specimens during the acute phase of infection. Negative results do not preclude SARS-CoV-2 infection, do not rule out co-infections with other pathogens, and should not be used as the sole basis for treatment or other patient management decisions. Negative results must be combined with clinical observations, patient history, and epidemiological information. The expected result is Negative.  Fact Sheet for Patients: SugarRoll.be  Fact Sheet for Healthcare Providers: https://www.woods-mathews.com/  This test is not yet approved or cleared by the [place] FDA and  has been authorized for detection and/or diagnosis of SARS-CoV-2 by FDA under an Emergency Use Authorization (EUA). This EUA will remain  in effect (meaning this test can be used) for the duration of the COVID-19 declaration under Se ction 564(b)(1) of the Act, 21 U.S.C. section 360bbb-3(b)(1), unless the authorization is terminated or revoked sooner.  Performed at [facility], [place] [address] [place], [place] [address]       Radiology Studies: DG CHEST PORT 1 VIEW  Result Date: 04/21/2021 CLINICAL DATA:  Cough and congestion following cardioversion EXAM: PORTABLE CHEST 1 VIEW COMPARISON:  04/20/2021 FINDINGS: Cardiac shadow is stable. Pacemaker is again seen and stable. Aortic calcifications are noted. Elevation of the right hemidiaphragm is noted. Mild bibasilar atelectasis is seen. No acute bony abnormality is noted. IMPRESSION: Mild bibasilar atelectasis.  No other focal  abnormality is seen. Electronically Signed   By: Inez Catalina M.D.   On: 04/21/2021 16:42     Marzetta Board, MD, PhD [facility]  Between 7 am - 7 pm I am available, please contact me via Amion (for emergencies) or Securechat (non urgent messages)  Between 7 pm - 7 am I am not available, please contact night coverage MD/APP via Amion

## 2021-04-23 ENCOUNTER — Telehealth: Payer: Self-pay | Admitting: Pulmonary Disease

## 2021-04-23 ENCOUNTER — Inpatient Hospital Stay (HOSPITAL_COMMUNITY): Payer: Medicare Other

## 2021-04-23 ENCOUNTER — Telehealth: Payer: Self-pay

## 2021-04-23 DIAGNOSIS — J9601 Acute respiratory failure with hypoxia: Secondary | ICD-10-CM | POA: Diagnosis not present

## 2021-04-23 DIAGNOSIS — Z7901 Long term (current) use of anticoagulants: Secondary | ICD-10-CM | POA: Diagnosis not present

## 2021-04-23 DIAGNOSIS — I4892 Unspecified atrial flutter: Secondary | ICD-10-CM

## 2021-04-23 DIAGNOSIS — G4733 Obstructive sleep apnea (adult) (pediatric): Secondary | ICD-10-CM

## 2021-04-23 DIAGNOSIS — I5033 Acute on chronic diastolic (congestive) heart failure: Secondary | ICD-10-CM

## 2021-04-23 LAB — NM MYOCAR MULTI W/SPECT W/WALL MOTION / EF
Estimated workload: 1 METS
Exercise duration (min): 5 min
Exercise duration (sec): 14 s
MPHR: 133 {beats}/min
Peak HR: 95 {beats}/min
Percent HR: 71 %
Rest HR: 83 {beats}/min

## 2021-04-23 LAB — MRSA NEXT GEN BY PCR, NASAL: MRSA by PCR Next Gen: NOT DETECTED

## 2021-04-23 MED ORDER — TECHNETIUM TC 99M TETROFOSMIN IV KIT
11.0000 | PACK | Freq: Once | INTRAVENOUS | Status: AC | PRN
  Administered 2021-04-23: 11 via INTRAVENOUS

## 2021-04-23 MED ORDER — TECHNETIUM TC 99M TETROFOSMIN IV KIT
32.3000 | PACK | Freq: Once | INTRAVENOUS | Status: AC | PRN
  Administered 2021-04-23: 32.3 via INTRAVENOUS

## 2021-04-23 MED ORDER — ORAL CARE MOUTH RINSE
15.0000 mL | Freq: Two times a day (BID) | OROMUCOSAL | Status: DC
  Administered 2021-04-23 – 2021-04-25 (×6): 15 mL via OROMUCOSAL

## 2021-04-23 MED ORDER — REGADENOSON 0.4 MG/5ML IV SOLN
INTRAVENOUS | Status: AC
  Administered 2021-04-23: 0.4 mg via INTRAVENOUS
  Filled 2021-04-23: qty 5

## 2021-04-23 MED ORDER — REGADENOSON 0.4 MG/5ML IV SOLN: 0.4000 mg | Freq: Once | INTRAVENOUS | Status: AC

## 2021-04-23 MED ORDER — TORSEMIDE 20 MG PO TABS
40.0000 mg | ORAL_TABLET | Freq: Every day | ORAL | Status: DC
  Administered 2021-04-23 – 2021-04-25 (×3): 40 mg via ORAL
  Filled 2021-04-23 (×3): qty 2

## 2021-04-23 NOTE — Progress Notes (Signed)
   Suzie Portela presented for a nuclear stress test today.  No immediate complications.  Stress imaging is pending at this time.  Preliminary EKG findings may be listed in the chart, but the stress test result will not be finalized until perfusion imaging is complete.   Kathyrn Drown, NP-C 04/23/2021, 10:12 AM

## 2021-04-23 NOTE — Telephone Encounter (Signed)
I called and spoke with patient daughter Cindy Cervantes, who is emergency contact, regarding message. Patient is currently admitted but is wanting a order for Bipap mask that they are wanting to buy themselves. I informed patient that I have printed out the order and will have the provider sign it and leave it upfront and to come the by office to get it. Daughter verbalized understanding, nothing further needed.

## 2021-04-23 NOTE — Progress Notes (Signed)
Progress Note  Patient Name: Cindy Cervantes Date of Encounter: 04/23/2021  [facility] Cardiologist: None Dr. [name]  Subjective   Today she notes wheezing.  Not feeling as well.   Inpatient Medications    Scheduled Meds:  allopurinol  100 mg Oral Daily   diltiazem  60 mg Oral Q12H   ezetimibe  10 mg Oral QPM   flecainide  50 mg Oral Q12H   fluticasone  2 spray Each Nare BID   guaiFENesin  600 mg Oral BID   levothyroxine  75 mcg Oral QAC breakfast   mouth rinse  15 mL Mouth Rinse BID   metoprolol succinate  25 mg Oral QHS   polyethylene glycol  17 g Oral Daily   potassium chloride SA  40 mEq Oral Daily   rivaroxaban  15 mg Oral Daily   sertraline  50 mg Oral Daily   sodium chloride flush  3 mL Intravenous Q12H   torsemide  20 mg Oral Daily   Continuous Infusions:   PRN Meds: acetaminophen **OR** acetaminophen, bisacodyl, ipratropium-albuterol, ondansetron **OR** ondansetron (ZOFRAN) IV   Vital Signs    Vitals:   04/23/21 0944 04/23/21 0959 04/23/21 1001 04/23/21 1003  BP: 121/69 (!) 124/58 (!) 118/59 (!) 115/54  Pulse:      Resp:      Temp:      TempSrc:      SpO2:      Weight:      Height:        Intake/Output Summary (Last 24 hours) at 04/23/2021 1020 Last data filed at 04/23/2021 0538 Gross per 24 hour  Intake 360 ml  Output 400 ml  Net -40 ml   Last 3 Weights 04/23/2021 04/20/2021 04/20/2021  Weight (lbs) 213 lb 6.5 oz 206 lb 9.1 oz 206 lb 9.1 oz  Weight (kg) 96.8 kg 93.7 kg 93.7 kg      Telemetry    Sinus rhythm --> Atrial flutter at 6: 30 pm.  Rate mostly <100 bpm.  - Personally Reviewed  ECG    N/a - Personally Reviewed  Physical Exam   VS:  BP (!) 115/54   Pulse 78   Temp 97.8 F (36.6 C) (Oral)   Resp 18   Ht '5\' 4"'$  (1.626 m)   Wt 96.8 kg   SpO2 97%   BMI 36.63 kg/m  , BMI Body mass index is 36.63 kg/m. GENERAL:  Well appearing HEENT: Pupils equal round and reactive, fundi not visualized, oral mucosa unremarkable NECK:  No  jugular venous distention, waveform within normal limits, carotid upstroke brisk and symmetric, no bruit LUNGS:  Expiratory wheeze bilaterally HEART: Irregularly irregular.  PMI not displaced or sustained,S1 and S2 within normal limits, no S3, no S4, no clicks, no rubs, II/VI systolic murmur ABD:  Flat, positive bowel sounds normal in frequency in pitch, no bruits, no rebound, no guarding, no midline pulsatile mass, no hepatomegaly, no splenomegaly EXT:  2 plus pulses throughout, 1+ LE edema, no cyanosis no clubbing SKIN:  No rashes no nodules NEURO:  Cranial nerves II through XII grossly intact, motor grossly intact throughout PSYCH:  Cognitively intact, oriented to person place and time  Labs    High Sensitivity Troponin:   Recent Labs  Lab 04/20/21 0135 04/20/21 0508  TROPONINIHS 11 12      Chemistry Recent Labs  Lab 04/20/21 0135 04/20/21 1011 04/21/21 0144  NA 141 141 142  K 4.6 5.0 4.0  CL 101  --  102  CO2 36*  --  31  GLUCOSE 126*  --  89  BUN 20  --  16  CREATININE 1.55*  --  1.43*  CALCIUM 8.7*  --  8.8*  PROT 6.1*  --   --   ALBUMIN 3.0*  --   --   AST 23  --   --   ALT 10  --   --   ALKPHOS 80  --   --   BILITOT 1.0  --   --   GFRNONAA 32*  --  35*  ANIONGAP 4*  --  9     Hematology Recent Labs  Lab 04/20/21 0135 04/20/21 1011 04/21/21 0144  WBC 9.6  --  10.7*  RBC 3.36*  --  3.57*  HGB 10.5* 11.2* 11.2*  HCT 35.5* 33.0* 37.6  MCV 105.7*  --  105.3*  MCH 31.3  --  31.4  MCHC 29.6*  --  29.8*  RDW 14.0  --  13.9  PLT 188  --  177    BNP Recent Labs  Lab 04/20/21 0135  BNP 183.9*     DDimer No results for input(s): DDIMER in the last 168 hours.   Radiology    DG CHEST PORT 1 VIEW  Result Date: 04/21/2021 CLINICAL DATA:  Cough and congestion following cardioversion EXAM: PORTABLE CHEST 1 VIEW COMPARISON:  04/20/2021 FINDINGS: Cardiac shadow is stable. Pacemaker is again seen and stable. Aortic calcifications are noted. Elevation of the  right hemidiaphragm is noted. Mild bibasilar atelectasis is seen. No acute bony abnormality is noted. IMPRESSION: Mild bibasilar atelectasis.  No other focal abnormality is seen. Electronically Signed   By: Inez Catalina M.D.   On: 04/21/2021 16:42    Cardiac Studies   Echo 04/20/21:  1. Left ventricular ejection fraction, by estimation, is 60 to 65%. The  left ventricle has normal function. The left ventricle has no regional  wall motion abnormalities. Left ventricular diastolic function could not  be evaluated.   2. Right ventricular systolic function is normal. The right ventricular  size is normal. There is normal pulmonary artery systolic pressure.   3. The mitral valve is normal in structure. Trivial mitral valve  regurgitation.   4. The aortic valve is grossly normal. There is moderate calcification of  the aortic valve. There is moderate thickening of the aortic valve. Aortic  valve regurgitation is not visualized. Mild to moderate aortic valve  sclerosis/calcification is present,   without any evidence of aortic stenosis.   5. The inferior vena cava is normal in size with <50% respiratory  variability, suggesting right atrial pressure of 8 mmHg.   Patient Profile     87 y.o. female with persistent atrial fibrillation, sick sinus syndrome status post pacemaker, chronic diastolic heart failure, hypertension, hyperlipidemia, OSA, and GERD admitted with atrial flutter.  Assessment & Plan    # Atrial flutter: Ms. Woodberry underwent successful cardioversion on 7/20.  However, she quickly went back into atrial flutter.  She was given adose of flecainide 150 mg but did nto convert.  She was started on 50 mg twice daily.  She is currently getting a Lexicographer.  She previously developed tremors on amiodarone, though after discussion with Dr. [name], it is not clear that her amiodarone.  Her diltiazem drip was discontinued and switched to oral diltiazem.  Continue metoprolol.  Rates are  controlled.   Given that her heart rates were well-controlled and atrial flutter on admission, I suspect that  she will develop recurrent heart failure if we leave her in atrial flutter for long.  Plan for repeat DCCV on Monday.   # Acute on chronic diastolic heart failure: Today she is wheezing and more volume overloaded.  She was started on her home torsemide and not diuresing well.  We will switch to '40mg'$  daily.       For questions or updates, please contact [place] Please consult [email or web address] for contact info under        Signed, Skeet Latch, MD  04/23/2021, 10:20 AM

## 2021-04-23 NOTE — Telephone Encounter (Signed)
Spoke with pt who requested I speak with her daughter today.    Advised leslie that as of 04/20/21, device within 3 months of ERI.  We cannot schedule battery changeout until ERI met.

## 2021-04-23 NOTE — Telephone Encounter (Signed)
Patient daughter Magda Paganini called in stating is being seen today in cardiac for her Afib and a PA told her that she needs her battery changed out. Patient daughter just has some questions about it

## 2021-04-23 NOTE — Progress Notes (Signed)
PROGRESS NOTE  Cindy Cervantes [id number] DOB: 08/06/1932 DOA: 04/20/2021 PCP: Virgie Dad, MD   LOS: 3 days   Brief Narrative / Interim history: 87 year old female with history of HTN, HLD, PAF on anticoagulation, chronic respiratory failure on 2 L oxygen [place], SSS status post pacemaker, chronic diastolic CHF comes into the hospital with complaints of chest pains as well as progressive shortness of breath.  Cardiology was consulted.  On interrogation of her device she was noted to be in a flutter more prevalent in the last month, possibly explain her symptoms.  She was initially placed on Cardizem infusion, status post cardioversion on 7/20, a flutter recurred 7/21  Subjective / 24h Interval events: Feels again a little bit congested and is hearing some wheezing.  No chest pain, no palpitations  Assessment & Plan: Principal Problem PAF/flutter, with RVR-patient had rates in the 130-150s on admission.  She was started on Cardizem drip and cardiology was consulted.  She was continued on anticoagulation with Xarelto and per patient she has not missed a dose in the last month.  She eventually underwent DCCV on 7/20, however her a flutter recurred 7/21.  -She underwent a stress test today which was low risk -She was started on flecainide, continue, potentially her reattempted cardioversion on Monday per cardiology  Active Problems New onset cough, chest congestion, underlying chronic hypoxic hypoxic respiratory failure / COPD-she is nightly BiPAP dependent and generally using 2 L of oxygen at home 24/7.  Following her DCCV on 7/20 when she returned to the room she has been having increased congestion with coughing, concern for maybe small aspiration episode -Chest x-ray unremarkable, wheezing resolved yesterday but increasing again today.  She is slightly fluid overloaded and was placed on diuretics per cardiology.  If her wheezing is not improving may need steroids but will continue to  monitor for no  Sick sinus syndrome status post pacemaker-battery will need to be replaced as an outpatient  CKD 3b -currently her creatinine is at baseline  Anemia of chronic disease-hemoglobin stable  Hypothyroidism-continue Synthroid TSH normal at 2.5  Obesity, class II-BMI 35.5, she would benefit from weight loss  HLD - continue home regimen  Scheduled Meds:  allopurinol  100 mg Oral Daily   diltiazem  60 mg Oral Q12H   ezetimibe  10 mg Oral QPM   flecainide  50 mg Oral Q12H   fluticasone  2 spray Each Nare BID   guaiFENesin  600 mg Oral BID   levothyroxine  75 mcg Oral QAC breakfast   mouth rinse  15 mL Mouth Rinse BID   metoprolol succinate  25 mg Oral QHS   polyethylene glycol  17 g Oral Daily   potassium chloride SA  40 mEq Oral Daily   rivaroxaban  15 mg Oral Daily   sertraline  50 mg Oral Daily   sodium chloride flush  3 mL Intravenous Q12H   torsemide  40 mg Oral Daily   Continuous Infusions: PRN Meds:.acetaminophen **OR** acetaminophen, bisacodyl, ipratropium-albuterol, ondansetron **OR** ondansetron (ZOFRAN) IV  Diet Orders (From admission, onward)     Start     Ordered   04/23/21 1110  Diet Heart Room service appropriate? Yes; Fluid consistency: Thin  Diet effective now       Question Answer Comment  Room service appropriate? Yes   Fluid consistency: Thin      04/23/21 1109            DVT prophylaxis:  Rivaroxaban (XARELTO) tablet 15 mg  Code Status: DNR  Family Communication: No family at bedside  Status is: Inpatient  Remains inpatient appropriate because:Inpatient level of care appropriate due to severity of illness  Dispo: The patient is from: Home              Anticipated d/c is to: Home              Patient currently is not medically stable to d/c.   Difficult to place patient No   Level of care: Progressive  Consultants:  Cardiology   Procedures:  DCCV 7/20  Microbiology  none  Antimicrobials: none     Objective: Vitals:   04/23/21 0959 04/23/21 1001 04/23/21 1003 04/23/21 1152  BP: (!) 124/58 (!) 118/59 (!) 115/54 120/70  Pulse:    85  Resp:    19  Temp:    98 F (36.7 C)  TempSrc:    Oral  SpO2:    99%  Weight:      Height:        Intake/Output Summary (Last 24 hours) at 04/23/2021 1158 Last data filed at 04/23/2021 [id number] Gross per 24 hour  Intake 360 ml  Output 400 ml  Net -40 ml    Filed Weights   04/20/21 0032 04/20/21 1740 04/23/21 0305  Weight: 93.7 kg 93.7 kg 96.8 kg    Examination:  Constitutional: NAD, in bed Eyes: No icterus ENMT: mmm Neck: normal, supple Respiratory: Faint end expiratory wheezing, no crackles, normal respiratory effort Cardiovascular: Irregular, trace lower extremity edema Abdomen: Soft, nontender, nondistended, bowel sounds positive Musculoskeletal: no clubbing / cyanosis.  Skin: No rashes appreciated Neurologic: Nonfocal, equal strength   Data Reviewed: I have independently reviewed following labs and imaging studies  CBC: Recent Labs  Lab 04/20/21 0135 04/20/21 1011 04/21/21 0144  WBC 9.6  --  10.7*  NEUTROABS 7.4  --   --   HGB 10.5* 11.2* 11.2*  HCT 35.5* 33.0* 37.6  MCV 105.7*  --  105.3*  PLT 188  --  [id number]    Basic Metabolic Panel: Recent Labs  Lab 04/20/21 0135 04/20/21 0840 04/20/21 1011 04/21/21 0144  NA 141  --  141 142  K 4.6  --  5.0 4.0  CL 101  --   --  102  CO2 36*  --   --  31  GLUCOSE 126*  --   --  89  BUN 20  --   --  16  CREATININE 1.55*  --   --  1.43*  CALCIUM 8.7*  --   --  8.8*  MG  --  2.2  --   --     Liver Function Tests: Recent Labs  Lab 04/20/21 0135  AST 23  ALT 10  ALKPHOS 80  BILITOT 1.0  PROT 6.1*  ALBUMIN 3.0*    Coagulation Profile: No results for input(s): INR, PROTIME in the last 168 hours. HbA1C: No results for input(s): HGBA1C in the last 72 hours. CBG: No results for input(s): GLUCAP in the last 168 hours.  Recent Results (from the past 240 hour(s))   SARS CORONAVIRUS 2 (TAT 6-24 HRS) Nasopharyngeal Nasopharyngeal Swab     Status: None   Collection Time: 04/20/21  6:58 AM   Specimen: Nasopharyngeal Swab  Result Value Ref Range Status   SARS Coronavirus 2 NEGATIVE NEGATIVE Final    Comment: (NOTE) SARS-CoV-2 target nucleic acids are NOT DETECTED.  The SARS-CoV-2 RNA is generally detectable in upper and lower respiratory specimens during the  acute phase of infection. Negative results do not preclude SARS-CoV-2 infection, do not rule out co-infections with other pathogens, and should not be used as the sole basis for treatment or other patient management decisions. Negative results must be combined with clinical observations, patient history, and epidemiological information. The expected result is Negative.  Fact Sheet for Patients: SugarRoll.be  Fact Sheet for Healthcare Providers: https://www.woods-mathews.com/  This test is not yet approved or cleared by the [place] FDA and  has been authorized for detection and/or diagnosis of SARS-CoV-2 by FDA under an Emergency Use Authorization (EUA). This EUA will remain  in effect (meaning this test can be used) for the duration of the COVID-19 declaration under Se ction 564(b)(1) of the Act, 21 U.S.C. section 360bbb-3(b)(1), unless the authorization is terminated or revoked sooner.  Performed at [facility], [place] [address] [place], [place] [address]   MRSA Next Gen by PCR, Nasal     Status: None   Collection Time: 04/23/21  6:57 AM   Specimen: Nasal Mucosa; Nasal Swab  Result Value Ref Range Status   MRSA by PCR Next Gen NOT DETECTED NOT DETECTED Final    Comment: (NOTE) The GeneXpert MRSA Assay (FDA approved for NASAL specimens only), is one component of a comprehensive MRSA colonization surveillance program. It is not intended to diagnose MRSA infection nor to guide or monitor treatment for MRSA infections. Test  performance is not FDA approved in patients less than 32 years old. Performed at [facility], [place] [address], [place], [place] [address]       Radiology Studies: NM Myocar Multi W/Spect Tamela Oddi Motion / EF  Result Date: 04/23/2021  There was no ST segment deviation noted during stress.  This is a low risk study.  Nuclear stress EF: 61%.  The left ventricular ejection fraction is normal (55-65%).  Normal perfusion. LVEF 61% with normal wall motion. This is a low risk study.     Marzetta Board, MD, PhD [facility]  Between 7 am - 7 pm I am available, please contact me via Amion (for emergencies) or Securechat (non urgent messages)  Between 7 pm - 7 am I am not available, please contact night coverage MD/APP via Amion

## 2021-04-24 DIAGNOSIS — I5033 Acute on chronic diastolic (congestive) heart failure: Secondary | ICD-10-CM | POA: Diagnosis not present

## 2021-04-24 DIAGNOSIS — I4892 Unspecified atrial flutter: Principal | ICD-10-CM

## 2021-04-24 LAB — BASIC METABOLIC PANEL
Anion gap: 6 (ref 5–15)
BUN: 17 mg/dL (ref 8–23)
CO2: 34 mmol/L — ABNORMAL HIGH (ref 22–32)
Calcium: 8.4 mg/dL — ABNORMAL LOW (ref 8.9–10.3)
Chloride: 100 mmol/L (ref 98–111)
Creatinine, Ser: 1.48 mg/dL — ABNORMAL HIGH (ref 0.44–1.00)
GFR, Estimated: 34 mL/min — ABNORMAL LOW (ref >60)
Glucose, Bld: 116 mg/dL — ABNORMAL HIGH (ref 70–99)
Potassium: 3.7 mmol/L (ref 3.5–5.1)
Sodium: 140 mmol/L (ref 135–145)

## 2021-04-24 LAB — CBC
HCT: 34.4 % — ABNORMAL LOW (ref 36.0–46.0)
Hemoglobin: 10.3 g/dL — ABNORMAL LOW (ref 12.0–15.0)
MCH: 31.2 pg (ref 26.0–34.0)
MCHC: 29.9 g/dL — ABNORMAL LOW (ref 30.0–36.0)
MCV: 104.2 fL — ABNORMAL HIGH (ref 80.0–100.0)
Platelets: 155 10*3/uL (ref 150–400)
RBC: 3.3 MIL/uL — ABNORMAL LOW (ref 3.87–5.11)
RDW: 13.9 % (ref 11.5–15.5)
WBC: 8.2 10*3/uL (ref 4.0–10.5)
nRBC: 0 % (ref 0.0–0.2)

## 2021-04-24 NOTE — Discharge Instructions (Signed)

## 2021-04-24 NOTE — Progress Notes (Signed)
Progress Note  Patient Name: Cindy Cervantes Date of Encounter: 04/24/2021  [facility] Cardiologist: None Dr. [name]  Subjective   Feels better today. Less SOB  Inpatient Medications    Scheduled Meds:  allopurinol  100 mg Oral Daily   diltiazem  60 mg Oral Q12H   ezetimibe  10 mg Oral QPM   flecainide  50 mg Oral Q12H   fluticasone  2 spray Each Nare BID   guaiFENesin  600 mg Oral BID   levothyroxine  75 mcg Oral QAC breakfast   mouth rinse  15 mL Mouth Rinse BID   metoprolol succinate  25 mg Oral QHS   polyethylene glycol  17 g Oral Daily   potassium chloride SA  40 mEq Oral Daily   rivaroxaban  15 mg Oral Daily   sertraline  50 mg Oral Daily   sodium chloride flush  3 mL Intravenous Q12H   torsemide  40 mg Oral Daily   Continuous Infusions:   PRN Meds: acetaminophen **OR** acetaminophen, bisacodyl, ipratropium-albuterol, ondansetron **OR** ondansetron (ZOFRAN) IV   Vital Signs    Vitals:   04/23/21 2037 04/24/21 0000 04/24/21 0420 04/24/21 0759  BP: 121/67 109/63 111/67 126/84  Pulse: 74 74 70 82  Resp: '19 16 18 17  '$ Temp: 98 F (36.7 C) 98.1 F (36.7 C) 97.8 F (36.6 C) 98 F (36.7 C)  TempSrc: Oral Oral Oral Oral  SpO2: 97% 96% 97% 95%  Weight:   95.7 kg   Height:        Intake/Output Summary (Last 24 hours) at 04/24/2021 1026 Last data filed at 04/24/2021 0107 Gross per 24 hour  Intake 3 ml  Output 2450 ml  Net -2447 ml    Last 3 Weights 04/24/2021 04/23/2021 04/20/2021  Weight (lbs) 210 lb 15.7 oz 213 lb 6.5 oz 206 lb 9.1 oz  Weight (kg) 95.7 kg 96.8 kg 93.7 kg      Telemetry    Atrial flutter with CVR  - Personally Reviewed  ECG    N/a - Personally Reviewed  Physical Exam   VS:  BP 126/84 (BP Location: Right Arm)   Pulse 82   Temp 98 F (36.7 C) (Oral)   Resp 17   Ht '5\' 4"'$  (1.626 m)   Wt 95.7 kg   SpO2 95%   BMI 36.21 kg/m  , BMI Body mass index is 36.21 kg/m.  GEN: Well nourished, well developed in no acute  distress HEENT: Normal NECK: No JVD; No carotid bruits LYMPHATICS: No lymphadenopathy CARDIAC:RRR, no murmurs, rubs, gallops RESPIRATORY:  Clear to auscultation without rales, wheezing or rhonchi  ABDOMEN: Soft, non-tender, non-distended MUSCULOSKELETAL:  No edema; No deformity  SKIN: Warm and dry NEUROLOGIC:  Alert and oriented x 3 PSYCHIATRIC:  Normal affect    Labs    High Sensitivity Troponin:   Recent Labs  Lab 04/20/21 0135 04/20/21 0508  TROPONINIHS 11 12       Chemistry Recent Labs  Lab 04/20/21 0135 04/20/21 1011 04/21/21 0144 04/24/21 0055  NA 141 141 142 140  K 4.6 5.0 4.0 3.7  CL 101  --  102 100  CO2 36*  --  31 34*  GLUCOSE 126*  --  89 116*  BUN 20  --  16 17  CREATININE 1.55*  --  1.43* 1.48*  CALCIUM 8.7*  --  8.8* 8.4*  PROT 6.1*  --   --   --   ALBUMIN 3.0*  --   --   --  AST 23  --   --   --   ALT 10  --   --   --   ALKPHOS 80  --   --   --   BILITOT 1.0  --   --   --   GFRNONAA 32*  --  35* 34*  ANIONGAP 4*  --  9 6      Hematology Recent Labs  Lab 04/20/21 0135 04/20/21 1011 04/21/21 0144 04/24/21 0055  WBC 9.6  --  10.7* 8.2  RBC 3.36*  --  3.57* 3.30*  HGB 10.5* 11.2* 11.2* 10.3*  HCT 35.5* 33.0* 37.6 34.4*  MCV 105.7*  --  105.3* 104.2*  MCH 31.3  --  31.4 31.2  MCHC 29.6*  --  29.8* 29.9*  RDW 14.0  --  13.9 13.9  PLT 188  --  177 155     BNP Recent Labs  Lab 04/20/21 0135  BNP 183.9*      DDimer No results for input(s): DDIMER in the last 168 hours.   Radiology    NM Myocar Multi W/Spect W/Wall Motion / EF  Result Date: 04/23/2021  There was no ST segment deviation noted during stress.  This is a low risk study.  Nuclear stress EF: 61%.  The left ventricular ejection fraction is normal (55-65%).  Normal perfusion. LVEF 61% with normal wall motion. This is a low risk study.    Cardiac Studies   Echo 04/20/21:  1. Left ventricular ejection fraction, by estimation, is 60 to 65%. The  left ventricle has  normal function. The left ventricle has no regional  wall motion abnormalities. Left ventricular diastolic function could not  be evaluated.   2. Right ventricular systolic function is normal. The right ventricular  size is normal. There is normal pulmonary artery systolic pressure.   3. The mitral valve is normal in structure. Trivial mitral valve  regurgitation.   4. The aortic valve is grossly normal. There is moderate calcification of  the aortic valve. There is moderate thickening of the aortic valve. Aortic  valve regurgitation is not visualized. Mild to moderate aortic valve  sclerosis/calcification is present,   without any evidence of aortic stenosis.   5. The inferior vena cava is normal in size with <50% respiratory  variability, suggesting right atrial pressure of 8 mmHg.   Patient Profile     87 y.o. female with persistent atrial fibrillation, sick sinus syndrome status post pacemaker, chronic diastolic heart failure, hypertension, hyperlipidemia, OSA, and GERD admitted with atrial flutter.  Assessment & Plan    # Atrial flutter: -s/p successful cardioversion on 7/20 and reverted back quickly into atrial flutter.  S -she was given adose of flecainide 150 mg but did not convert.   -she is now on Toprol XL '25mg'$  daily -[employer] with no ischemia -She previously developed tremors on amiodarone -started on Flecainide '50mg'$  BID -Her diltiazem drip was discontinued and now on Cardizem SR '60mg'$  BID -continue Xarelto '15mg'$  daily -Plan for repeat DCCV on Monday.   # Acute on chronic diastolic heart failure: -breathing better today  -Torsemide increased to '40mg'$  daily -She put out 2.4L and is net neg 960cc. -weight down 3lbs from yesterday    I have spent a total of 30 minutes with patient reviewing 2D echo, nuclear stress test , telemetry, EKGs, labs and examining patient as well as establishing an assessment and plan that was discussed with the patient.  > 50% of time was  spent  in direct patient care.     For questions or updates, please contact [place] Please consult [email or web address] for contact info under        Signed, Fransico Him, MD  04/24/2021, 10:26 AM

## 2021-04-24 NOTE — Progress Notes (Signed)
PROGRESS NOTE    Cindy Cervantes  [id number] DOB: December 23, 1931 DOA: 04/20/2021 PCP: Virgie Dad, MD   Brief Narrative:   87 year old female with history of HTN, HLD, PAF on anticoagulation, chronic respiratory failure on 2 L oxygen [place], SSS status post pacemaker, chronic diastolic CHF comes into the hospital with complaints of chest pains as well as progressive shortness of breath.  Cardiology was consulted.  On interrogation of her device she was noted to be in a flutter more prevalent in the last month, possibly explain her symptoms.  She was initially placed on Cardizem infusion, status post cardioversion on 7/20, a flutter recurred 7/21  Assessment & Plan:   Active Problems:   Long term current use of anticoagulant   Chronic diastolic heart failure (HCC)   Obesity   Hypothyroidism   OSA treated with BiPAP   Chronic respiratory failure with hypoxia (HCC)   Acute respiratory failure with hypoxia (HCC)   PAF/flutter, with RVR -Initial successful cardioversion 7/20 but now back into a flutter - Flecainide 50 mg twice daily.  Currently on Toprol 25 mg daily - Myoview-no ischemia - Continue Xarelto - Cardioversion planned on Monday - Cardizem 60 mg twice daily - Cardiology following   Acute diastolic congestive heart failure, class III -Continue gentle diuresis with torsemide 40 mg daily   Sick sinus syndrome status post pacemaker-battery will need to be replaced as an outpatient   CKD 3b -currently her creatinine is at baseline   Anemia of chronic disease-hemoglobin stable   Hypothyroidism-continue Synthroid TSH normal at 2.5   Obesity, class II-BMI 35.5, she would benefit from weight loss   HLD - continue home regimen    DVT prophylaxis: Xarelto Code Status: DNR Family Communication:    Status is: Inpatient  Remains inpatient appropriate because:Inpatient level of care appropriate due to severity of illness  Dispo: The patient is from: Home               Anticipated d/c is to: Home              Patient currently is not medically stable to d/c.  Pending cardiac clearance.  Plans for cardioversion on Monday   Difficult to place patient No      Subjective: Overall feels better today, no complaints.  Review of Systems Otherwise negative except as per HPI, including: General: Denies fever, chills, night sweats or unintended weight loss. Resp: Denies cough, wheezing, shortness of breath. Cardiac: Denies chest pain, palpitations, orthopnea, paroxysmal nocturnal dyspnea. GI: Denies abdominal pain, nausea, vomiting, diarrhea or constipation GU: Denies dysuria, frequency, hesitancy or incontinence MS: Denies muscle aches, joint pain or swelling Neuro: Denies headache, neurologic deficits (focal weakness, numbness, tingling), abnormal gait Psych: Denies anxiety, depression, SI/HI/AVH Skin: Denies new rashes or lesions ID: Denies sick contacts, exotic exposures, travel  Examination:  General exam: Appears calm and comfortable  Respiratory system: Clear to auscultation. Respiratory effort normal. Cardiovascular system: S1 & S2 heard, irregularly irregular no JVD, murmurs, rubs, gallops or clicks. No pedal edema. Gastrointestinal system: Abdomen is nondistended, soft and nontender. No organomegaly or masses felt. Normal bowel sounds heard. Central nervous system: Alert and oriented. No focal neurological deficits. Extremities: Symmetric 5 x 5 power. Skin: No rashes, lesions or ulcers Psychiatry: Judgement and insight appear normal. Mood & affect appropriate.     Objective: Vitals:   04/24/21 0000 04/24/21 0420 04/24/21 0759 04/24/21 1154  BP: 109/63 111/67 126/84 127/66  Pulse: 74 70 82 78  Resp: 16  $'18 17 18  'S$ Temp: 98.1 F (36.7 C) 97.8 F (36.6 C) 98 F (36.7 C) 97.7 F (36.5 C)  TempSrc: Oral Oral Oral Oral  SpO2: 96% 97% 95% 95%  Weight:  95.7 kg    Height:        Intake/Output Summary (Last 24 hours) at 04/24/2021  1229 Last data filed at 04/24/2021 1020 Gross per 24 hour  Intake 3 ml  Output 2200 ml  Net -2197 ml   Filed Weights   04/20/21 1740 04/23/21 0305 04/24/21 0420  Weight: 93.7 kg 96.8 kg 95.7 kg     Data Reviewed:   CBC: Recent Labs  Lab 04/20/21 0135 04/20/21 1011 04/21/21 0144 04/24/21 0055  WBC 9.6  --  10.7* 8.2  NEUTROABS 7.4  --   --   --   HGB 10.5* 11.2* 11.2* 10.3*  HCT 35.5* 33.0* 37.6 34.4*  MCV 105.7*  --  105.3* 104.2*  PLT 188  --  177 [id number]   Basic Metabolic Panel: Recent Labs  Lab 04/20/21 0135 04/20/21 0840 04/20/21 1011 04/21/21 0144 04/24/21 0055  NA 141  --  141 142 140  K 4.6  --  5.0 4.0 3.7  CL 101  --   --  102 100  CO2 36*  --   --  31 34*  GLUCOSE 126*  --   --  89 116*  BUN 20  --   --  16 17  CREATININE 1.55*  --   --  1.43* 1.48*  CALCIUM 8.7*  --   --  8.8* 8.4*  MG  --  2.2  --   --   --    GFR: Estimated Creatinine Clearance: 30.1 mL/min (A) (by C-G formula based on SCr of 1.48 mg/dL (H)). Liver Function Tests: Recent Labs  Lab 04/20/21 0135  AST 23  ALT 10  ALKPHOS 80  BILITOT 1.0  PROT 6.1*  ALBUMIN 3.0*   No results for input(s): LIPASE, AMYLASE in the last 168 hours. No results for input(s): AMMONIA in the last 168 hours. Coagulation Profile: No results for input(s): INR, PROTIME in the last 168 hours. Cardiac Enzymes: No results for input(s): CKTOTAL, CKMB, CKMBINDEX, TROPONINI in the last 168 hours. BNP (last 3 results) No results for input(s): PROBNP in the last 8760 hours. HbA1C: No results for input(s): HGBA1C in the last 72 hours. CBG: No results for input(s): GLUCAP in the last 168 hours. Lipid Profile: No results for input(s): CHOL, HDL, LDLCALC, TRIG, CHOLHDL, LDLDIRECT in the last 72 hours. Thyroid Function Tests: No results for input(s): TSH, T4TOTAL, FREET4, T3FREE, THYROIDAB in the last 72 hours. Anemia Panel: No results for input(s): VITAMINB12, FOLATE, FERRITIN, TIBC, IRON, RETICCTPCT in the  last 72 hours. Sepsis Labs: No results for input(s): PROCALCITON, LATICACIDVEN in the last 168 hours.  Recent Results (from the past 240 hour(s))  SARS CORONAVIRUS 2 (TAT 6-24 HRS) Nasopharyngeal Nasopharyngeal Swab     Status: None   Collection Time: 04/20/21  6:58 AM   Specimen: Nasopharyngeal Swab  Result Value Ref Range Status   SARS Coronavirus 2 NEGATIVE NEGATIVE Final    Comment: (NOTE) SARS-CoV-2 target nucleic acids are NOT DETECTED.  The SARS-CoV-2 RNA is generally detectable in upper and lower respiratory specimens during the acute phase of infection. Negative results do not preclude SARS-CoV-2 infection, do not rule out co-infections with other pathogens, and should not be used as the sole basis for treatment or other patient management decisions. Negative results  must be combined with clinical observations, patient history, and epidemiological information. The expected result is Negative.  Fact Sheet for Patients: SugarRoll.be  Fact Sheet for Healthcare Providers: https://www.woods-mathews.com/  This test is not yet approved or cleared by the [place] FDA and  has been authorized for detection and/or diagnosis of SARS-CoV-2 by FDA under an Emergency Use Authorization (EUA). This EUA will remain  in effect (meaning this test can be used) for the duration of the COVID-19 declaration under Se ction 564(b)(1) of the Act, 21 U.S.C. section 360bbb-3(b)(1), unless the authorization is terminated or revoked sooner.  Performed at [facility], [place] [address], [place], [place] [address]   MRSA Next Gen by PCR, Nasal     Status: None   Collection Time: 04/23/21  6:57 AM   Specimen: Nasal Mucosa; Nasal Swab  Result Value Ref Range Status   MRSA by PCR Next Gen NOT DETECTED NOT DETECTED Final    Comment: (NOTE) The GeneXpert MRSA Assay (FDA approved for NASAL specimens only), is one component of a comprehensive MRSA  colonization surveillance program. It is not intended to diagnose MRSA infection nor to guide or monitor treatment for MRSA infections. Test performance is not FDA approved in patients less than 82 years old. Performed at [facility], [place] [address], [place], [place] [address]          Radiology Studies: NM Myocar Multi W/Spect Tamela Oddi Motion / EF  Result Date: 04/23/2021  There was no ST segment deviation noted during stress.  This is a low risk study.  Nuclear stress EF: 61%.  The left ventricular ejection fraction is normal (55-65%).  Normal perfusion. LVEF 61% with normal wall motion. This is a low risk study.        Scheduled Meds:  allopurinol  100 mg Oral Daily   diltiazem  60 mg Oral Q12H   ezetimibe  10 mg Oral QPM   flecainide  50 mg Oral Q12H   fluticasone  2 spray Each Nare BID   guaiFENesin  600 mg Oral BID   levothyroxine  75 mcg Oral QAC breakfast   mouth rinse  15 mL Mouth Rinse BID   metoprolol succinate  25 mg Oral QHS   polyethylene glycol  17 g Oral Daily   potassium chloride SA  40 mEq Oral Daily   rivaroxaban  15 mg Oral Daily   sertraline  50 mg Oral Daily   sodium chloride flush  3 mL Intravenous Q12H   torsemide  40 mg Oral Daily   Continuous Infusions:   LOS: 4 days   Time spent= 35 mins    Hannahmarie Asberry Arsenio Loader, MD [facility]  If 7PM-7AM, please contact night-coverage  04/24/2021, 12:29 PM

## 2021-04-25 DIAGNOSIS — I5032 Chronic diastolic (congestive) heart failure: Secondary | ICD-10-CM | POA: Diagnosis not present

## 2021-04-25 DIAGNOSIS — I4891 Unspecified atrial fibrillation: Secondary | ICD-10-CM | POA: Diagnosis not present

## 2021-04-25 LAB — BASIC METABOLIC PANEL
Anion gap: 9 (ref 5–15)
BUN: 14 mg/dL (ref 8–23)
CO2: 33 mmol/L — ABNORMAL HIGH (ref 22–32)
Calcium: 8.5 mg/dL — ABNORMAL LOW (ref 8.9–10.3)
Chloride: 97 mmol/L — ABNORMAL LOW (ref 98–111)
Creatinine, Ser: 1.39 mg/dL — ABNORMAL HIGH (ref 0.44–1.00)
GFR, Estimated: 37 mL/min — ABNORMAL LOW (ref >60)
Glucose, Bld: 103 mg/dL — ABNORMAL HIGH (ref 70–99)
Potassium: 3.4 mmol/L — ABNORMAL LOW (ref 3.5–5.1)
Sodium: 139 mmol/L (ref 135–145)

## 2021-04-25 LAB — CBC
HCT: 33.1 % — ABNORMAL LOW (ref 36.0–46.0)
Hemoglobin: 10.3 g/dL — ABNORMAL LOW (ref 12.0–15.0)
MCH: 31.8 pg (ref 26.0–34.0)
MCHC: 31.1 g/dL (ref 30.0–36.0)
MCV: 102.2 fL — ABNORMAL HIGH (ref 80.0–100.0)
Platelets: 154 10*3/uL (ref 150–400)
RBC: 3.24 MIL/uL — ABNORMAL LOW (ref 3.87–5.11)
RDW: 13.7 % (ref 11.5–15.5)
WBC: 7.6 10*3/uL (ref 4.0–10.5)
nRBC: 0 % (ref 0.0–0.2)

## 2021-04-25 LAB — MAGNESIUM: Magnesium: 2 mg/dL (ref 1.7–2.4)

## 2021-04-25 MED ORDER — POTASSIUM CHLORIDE CRYS ER 20 MEQ PO TBCR
20.0000 meq | EXTENDED_RELEASE_TABLET | Freq: Once | ORAL | Status: AC
  Administered 2021-04-25: 20 meq via ORAL
  Filled 2021-04-25: qty 1

## 2021-04-25 NOTE — Evaluation (Signed)
Occupational Therapy Evaluation Patient Details Name: Cindy Cervantes MRN: [id number] DOB: [birth year] Today's Date: 04/25/2021    History of Present Illness 87 y.o. female presenting to ED with L sided chest pain and HR between 130-150. DG chest port revealed mild bibasilar atelectasis. PMH significant for HTN, hyperlipidemia, paroxysmal atrial fibrillation on anticoagulation, chronic respiratory failure, chronic diastolic heart failure, anemia, and MDS.   Clinical Impression   PTA, pt lived at an ALF where staff assisted with donning socks, cleaning, and provided meals. Currently, pt performing LB ADLs with min A and UB ADLs with set-up. Pt performing functional mobility with min guard A for safety. Pt presents with decreased strength, balance, and activity tolerance. Recommend discharge back to ALF with no OT follow up. Will continue to follow acutely to optimize safety and independence in ADLs.     Follow Up Recommendations  No OT follow up    Equipment Recommendations  None recommended by OT    Recommendations for Other Services       Precautions / Restrictions Precautions Precautions: Fall Restrictions Weight Bearing Restrictions: No      Mobility Bed Mobility               General bed mobility comments: Pt in recliner on arrival    Transfers Overall transfer level: Needs assistance Equipment used: Rolling walker (2 wheeled) Transfers: Sit to/from Stand Sit to Stand: Min guard         General transfer comment: Min Guard A for safety    Balance Overall balance assessment: Needs assistance Sitting-balance support: No upper extremity supported;Feet supported Sitting balance-Leahy Scale: Good     Standing balance support: Bilateral upper extremity supported;No upper extremity supported;During functional activity Standing balance-Leahy Scale: Fair Standing balance comment: RW during functional mobility                           ADL either  performed or assessed with clinical judgement   ADL Overall ADL's : Needs assistance/impaired Eating/Feeding: Set up;Sitting   Grooming: Oral care;Wash/dry face;Min guard;Standing Grooming Details (indicate cue type and reason): Pt hunching over sink and supporting self on sink during oral care. Pt standing without support while washing face. Min Guard A for safety. Upper Body Bathing: Supervision/ safety;Sitting   Lower Body Bathing: Sit to/from stand;Minimal assistance   Upper Body Dressing : Supervision/safety;Sitting   Lower Body Dressing: Sit to/from stand   Toilet Transfer: Min guard;Ambulation;Regular Toilet;RW   Toileting- Water quality scientist and Hygiene: Min guard;Sit to/from stand       Functional mobility during ADLs: Min guard;Rolling walker General ADL Comments: Pt performing grooming with min Guard A. Pt requiring min A for LB ADL due to inability to reach feet. Pt requiring set-up for UB ADL. Performing functional mobility with Min Guard A with RW.     Vision Baseline Vision/History: Wears glasses Wears Glasses: Reading only Patient Visual Report: No change from baseline Vision Assessment?: No apparent visual deficits     Perception Perception Perception Tested?: No Comments: no apparent difficulty with perception   Praxis Praxis Praxis tested?: Not tested Praxis-Other Comments: no apparent difficulty with motor planning    Pertinent Vitals/Pain Pain Assessment: Faces Faces Pain Scale: No hurt     Hand Dominance     Extremity/Trunk Assessment Upper Extremity Assessment Upper Extremity Assessment: Generalized weakness   Lower Extremity Assessment Lower Extremity Assessment: Defer to PT evaluation   Cervical / Trunk Assessment Cervical / Trunk Assessment: Normal  Communication Communication Communication: No difficulties   Cognition Arousal/Alertness: Awake/alert Behavior During Therapy: WFL for tasks assessed/performed Overall Cognitive  Status: Impaired/Different from baseline Area of Impairment: Following commands                       Following Commands: Follows one step commands consistently;Follows one step commands with increased time;Follows multi-step commands with increased time       General Comments: Pt following commands with increased time. Pt pleasant and conversational throughout session. Pt with good awareness and asking what her heart rate was during session.   General Comments  Pt pleasant and conversational throughout session.    Exercises     Shoulder Instructions      Home Living Family/patient expects to be discharged to:: Assisted living                             Home Equipment: Walker - 4 wheels;Electric scooter          Prior Functioning/Environment Level of Independence: Needs assistance  Gait / Transfers Assistance Needed: RW for short distances, power chair for longer distances ADL's / Homemaking Assistance Needed: Pt reporting she performs ADLs, but has a CNA who dons socks and dries her off after her shower. Pt additionally reporting staff at her ALF perform IADLs including cleaning, and providing meals.            OT Problem List: Decreased strength;Impaired balance (sitting and/or standing);Decreased activity tolerance;Decreased knowledge of use of DME or AE      OT Treatment/Interventions: Self-care/ADL training;Therapeutic exercise;DME and/or AE instruction;Therapeutic activities;Patient/family education    OT Goals(Current goals can be found in the care plan section) Acute Rehab OT Goals Patient Stated Goal: Go back to ALF OT Goal Formulation: With patient Time For Goal Achievement: 05/09/21 Potential to Achieve Goals: Good  OT Frequency: Min 2X/week   Barriers to D/C:            Co-evaluation              AM-PAC OT "6 Clicks" Daily Activity     Outcome Measure Help from another person eating meals?: A Little Help from another  person taking care of personal grooming?: A Little Help from another person toileting, which includes using toliet, bedpan, or urinal?: A Little Help from another person bathing (including washing, rinsing, drying)?: A Little Help from another person to put on and taking off regular upper body clothing?: A Little Help from another person to put on and taking off regular lower body clothing?: A Little 6 Click Score: 18   End of Session Equipment Utilized During Treatment: Gait belt;Rolling walker Nurse Communication: Mobility status  Activity Tolerance: Patient tolerated treatment well Patient left: in chair;with call bell/phone within reach  OT Visit Diagnosis: Unsteadiness on feet (R26.81);Muscle weakness (generalized) (M62.81)                Time: [id number] OT Time Calculation (min): 26 min Charges:  OT General Charges $OT Visit: 1 Visit OT Evaluation $OT Eval Moderate Complexity: 1 Mod OT Treatments $Self Care/Home Management : 8-22 mins  Shanda Howells, OTDS   Shanda Howells 04/25/2021, 4:39 PM

## 2021-04-25 NOTE — Evaluation (Signed)
Physical Therapy Evaluation Patient Details Name: Cindy Cervantes MRN: [id number] DOB: 05-15-1932 Today's Date: 04/25/2021   History of Present Illness  87 y.o. female presenting to ED with L sided chest pain and HR between 130-150. DG chest port revealed mild bibasilar atelectasis. PMH significant for HTN, hyperlipidemia, paroxysmal atrial fibrillation on anticoagulation, chronic respiratory failure, chronic diastolic heart failure, anemia, and MDS.  Clinical Impression   Pt admitted with above diagnosis. Comes from home where she lives at [facility]; walks to the  dining hall with a RW; Presents to PT with decr activity tolerance, generalized weakness; Able to walk the hallways with assist and with RW; Pt currently with functional limitations due to the deficits listed below (see PT Problem List). Pt will benefit from skilled PT to increase their independence and safety with mobility to allow discharge to the venue listed below.       Follow Up Recommendations Home health PT    Equipment Recommendations  None recommended by PT    Recommendations for Other Services       Precautions / Restrictions Precautions Precautions: Fall      Mobility  Bed Mobility Overal bed mobility: Needs Assistance Bed Mobility: Supine to Sit     Supine to sit: Min guard     General bed mobility comments: minguard for lines/leads    Transfers Overall transfer level: Needs assistance Equipment used: Rolling walker (2 wheeled) Transfers: Sit to/from Stand Sit to Stand: Min guard         General transfer comment: Min Guard A for safety  Ambulation/Gait Ambulation/Gait assistance: Counsellor (Feet): 50 Feet Assistive device: Rolling walker (2 wheeled) Gait Pattern/deviations: Step-through pattern;Decreased step length - right;Decreased step length - left;Trunk flexed     General Gait Details: Cues to self-monitor for ativity tolerance; a few small stutter steps, but able  to recover with min assist  Stairs            Wheelchair Mobility    Modified Rankin (Stroke Patients Only)       Balance     Sitting balance-Leahy Scale: Good       Standing balance-Leahy Scale: Fair                               Pertinent Vitals/Pain Pain Assessment: Faces Faces Pain Scale: No hurt Pain Intervention(s): Monitored during session    Home Living Family/patient expects to be discharged to:: Assisted living               Home Equipment: Walker - 4 wheels;Electric scooter      Prior Function Level of Independence: Needs assistance   Gait / Transfers Assistance Needed: RW for short distances, power chair for longer distances  ADL's / Homemaking Assistance Needed: Pt reporting she performs ADLs, but has a CNA who dons socks and dries her off after her shower. Pt additionally reporting staff at her ALF perform IADLs including cleaning, and providing meals.        Hand Dominance        Extremity/Trunk Assessment   Upper Extremity Assessment Upper Extremity Assessment: Defer to OT evaluation    Lower Extremity Assessment Lower Extremity Assessment: Generalized weakness       Communication   Communication: No difficulties  Cognition Arousal/Alertness: Awake/alert Behavior During Therapy: WFL for tasks assessed/performed Overall Cognitive Status: Within Functional Limits for tasks assessed  General Comments General comments (skin integrity, edema, etc.): O2 sats decr with amb sithout supplemental O2 see other note of this date for O2 qualifying walk    Exercises     Assessment/Plan    PT Assessment Patient needs continued PT services  PT Problem List Decreased strength;Decreased activity tolerance;Decreased balance;Decreased mobility;Cardiopulmonary status limiting activity       PT Treatment Interventions DME instruction;Gait training;Functional mobility  training;Therapeutic activities;Therapeutic exercise;Balance training;Patient/family education    PT Goals (Current goals can be found in the Care Plan section)  Acute Rehab PT Goals Patient Stated Goal: Go back to ALF PT Goal Formulation: With patient Time For Goal Achievement: 05/09/21 Potential to Achieve Goals: Good    Frequency Min 3X/week   Barriers to discharge        Co-evaluation               AM-PAC PT "6 Clicks" Mobility  Outcome Measure Help needed turning from your back to your side while in a flat bed without using bedrails?: None Help needed moving from lying on your back to sitting on the side of a flat bed without using bedrails?: None Help needed moving to and from a bed to a chair (including a wheelchair)?: A Little Help needed standing up from a chair using your arms (e.g., wheelchair or bedside chair)?: A Little Help needed to walk in hospital room?: A Little Help needed climbing 3-5 steps with a railing? : A Little 6 Click Score: 20    End of Session Equipment Utilized During Treatment: Gait belt;Oxygen Activity Tolerance: Patient tolerated treatment well Patient left: in chair;with call bell/phone within reach Nurse Communication: Mobility status PT Visit Diagnosis: Unsteadiness on feet (R26.81);Difficulty in walking, not elsewhere classified (R26.2)    Time: [id number] PT Time Calculation (min) (ACUTE ONLY): 29 min   Charges:   PT Evaluation $PT Eval Moderate Complexity: 1 Mod PT Treatments $Gait Training: 8-22 mins        Roney Marion, PT  Acute Rehabilitation Services Pager [phone] Office [phone]   [place] 04/25/2021, 8:29 PM

## 2021-04-25 NOTE — Progress Notes (Signed)
Physical Therapy Note  PT eval complete with full note to follow;  Recommend Back to ALF with HHPT follow up for transition out of hospital;    SATURATION QUALIFICATIONS: (This note is used to comply with regulatory documentation for home oxygen)  Patient Saturations on Room Air at Rest = 92%  Patient Saturations on Room Air while Ambulating = 84%  Patient Saturations on 2 Liters of oxygen while Ambulating = 94%  Please briefly explain why patient needs home oxygen: Patient requires supplemental oxygen to maintain oxygen saturations at acceptable, safe levels with physical activity.   Roney Marion, [place]  Acute Rehabilitation Services Pager [phone] Office [phone]

## 2021-04-25 NOTE — Progress Notes (Signed)
PROGRESS NOTE    Cindy Cervantes  [id number] DOB: 02-14-1932 DOA: 04/20/2021 PCP: Virgie Dad, MD   Brief Narrative:   87 year old female with history of HTN, HLD, PAF on anticoagulation, chronic respiratory failure on 2 L oxygen [place], SSS status post pacemaker, chronic diastolic CHF comes into the hospital with complaints of chest pains as well as progressive shortness of breath.  Cardiology was consulted.  On interrogation of her device she was noted to be in a flutter more prevalent in the last month, possibly explain her symptoms.  She was initially placed on Cardizem infusion, status post cardioversion on 7/20, a flutter recurred 7/21.  Plans for repeat cardioversion 7/25  Assessment & Plan:   Active Problems:   Long term current use of anticoagulant   Chronic diastolic heart failure (HCC)   Obesity   Hypothyroidism   OSA treated with BiPAP   Chronic respiratory failure with hypoxia (HCC)   Acute respiratory failure with hypoxia (HCC)   PAF/flutter, with RVR, currently rate controlled -Initial successful cardioversion 7/20 but now back into a flutter - Flecainide 50 mg twice daily.  Currently on Toprol 25 mg daily - Myoview-no ischemia - Continue Xarelto - Cardioversion planned on 7/25 - Cardizem 60 mg twice daily - Cardiology following   Acute diastolic congestive heart failure, class III -Continue gentle diuresis with torsemide 40 mg daily   Sick sinus syndrome status post pacemaker-battery will need to be replaced as an outpatient   CKD 3b -currently her creatinine is at baseline   Anemia of chronic disease-hemoglobin stable   Hypothyroidism-continue Synthroid TSH normal at 2.5   Obesity, class II-BMI 35.5, she would benefit from weight loss   HLD - continue home regimen    DVT prophylaxis: Xarelto Code Status: DNR Family Communication: Daughter updated  Status is: Inpatient  Remains inpatient appropriate because:Inpatient level of care  appropriate due to severity of illness  Dispo: The patient is from: Home              Anticipated d/c is to: Home              Patient currently is not medically stable to d/c.  Pending cardiac clearance.  Plans for cardioversion on Monday   Difficult to place patient No      Subjective: Doing okay no complaints  Review of Systems Otherwise negative except as per HPI, including: General: Denies fever, chills, night sweats or unintended weight loss. Resp: Denies cough, wheezing, shortness of breath. Cardiac: Denies chest pain, palpitations, orthopnea, paroxysmal nocturnal dyspnea. GI: Denies abdominal pain, nausea, vomiting, diarrhea or constipation GU: Denies dysuria, frequency, hesitancy or incontinence MS: Denies muscle aches, joint pain or swelling Neuro: Denies headache, neurologic deficits (focal weakness, numbness, tingling), abnormal gait Psych: Denies anxiety, depression, SI/HI/AVH Skin: Denies new rashes or lesions ID: Denies sick contacts, exotic exposures, travel  Examination:  Constitutional: Not in acute distress Respiratory: Clear to auscultation bilaterally Cardiovascular: Irregularly irregular, no rubs Abdomen: Nontender nondistended good bowel sounds Musculoskeletal: No edema noted Skin: No rashes seen Neurologic: CN 2-12 grossly intact.  And nonfocal Psychiatric: Normal judgment and insight. Alert and oriented x 3. Normal mood.  Objective: Vitals:   04/24/21 2250 04/25/21 0000 04/25/21 0415 04/25/21 0757  BP: 123/74 113/63 111/71 116/77  Pulse: 80 86 78 69  Resp: (!) 25 (!) '23 16 16  '$ Temp: 98.3 F (36.8 C) (!) 97.5 F (36.4 C) 98.2 F (36.8 C) 97.9 F (36.6 C)  TempSrc: Oral Oral Oral  Oral  SpO2: 98% 96% 97% 96%  Weight:      Height:        Intake/Output Summary (Last 24 hours) at 04/25/2021 0911 Last data filed at 04/24/2021 1900 Gross per 24 hour  Intake --  Output 1950 ml  Net -1950 ml   Filed Weights   04/20/21 1740 04/23/21 0305  04/24/21 0420  Weight: 93.7 kg 96.8 kg 95.7 kg     Data Reviewed:   CBC: Recent Labs  Lab 04/20/21 0135 04/20/21 1011 04/21/21 0144 04/24/21 0055 04/25/21 0453  WBC 9.6  --  10.7* 8.2 7.6  NEUTROABS 7.4  --   --   --   --   HGB 10.5* 11.2* 11.2* 10.3* 10.3*  HCT 35.5* 33.0* 37.6 34.4* 33.1*  MCV 105.7*  --  105.3* 104.2* 102.2*  PLT 188  --  177 155 [id number]   Basic Metabolic Panel: Recent Labs  Lab 04/20/21 0135 04/20/21 0840 04/20/21 1011 04/21/21 0144 04/24/21 0055 04/25/21 0453  NA 141  --  141 142 140 139  K 4.6  --  5.0 4.0 3.7 3.4*  CL 101  --   --  102 100 97*  CO2 36*  --   --  31 34* 33*  GLUCOSE 126*  --   --  89 116* 103*  BUN 20  --   --  '16 17 14  '$ CREATININE 1.55*  --   --  1.43* 1.48* 1.39*  CALCIUM 8.7*  --   --  8.8* 8.4* 8.5*  MG  --  2.2  --   --   --  2.0   GFR: Estimated Creatinine Clearance: 32 mL/min (A) (by C-G formula based on SCr of 1.39 mg/dL (H)). Liver Function Tests: Recent Labs  Lab 04/20/21 0135  AST 23  ALT 10  ALKPHOS 80  BILITOT 1.0  PROT 6.1*  ALBUMIN 3.0*   No results for input(s): LIPASE, AMYLASE in the last 168 hours. No results for input(s): AMMONIA in the last 168 hours. Coagulation Profile: No results for input(s): INR, PROTIME in the last 168 hours. Cardiac Enzymes: No results for input(s): CKTOTAL, CKMB, CKMBINDEX, TROPONINI in the last 168 hours. BNP (last 3 results) No results for input(s): PROBNP in the last 8760 hours. HbA1C: No results for input(s): HGBA1C in the last 72 hours. CBG: No results for input(s): GLUCAP in the last 168 hours. Lipid Profile: No results for input(s): CHOL, HDL, LDLCALC, TRIG, CHOLHDL, LDLDIRECT in the last 72 hours. Thyroid Function Tests: No results for input(s): TSH, T4TOTAL, FREET4, T3FREE, THYROIDAB in the last 72 hours. Anemia Panel: No results for input(s): VITAMINB12, FOLATE, FERRITIN, TIBC, IRON, RETICCTPCT in the last 72 hours. Sepsis Labs: No results for input(s):  PROCALCITON, LATICACIDVEN in the last 168 hours.  Recent Results (from the past 240 hour(s))  SARS CORONAVIRUS 2 (TAT 6-24 HRS) Nasopharyngeal Nasopharyngeal Swab     Status: None   Collection Time: 04/20/21  6:58 AM   Specimen: Nasopharyngeal Swab  Result Value Ref Range Status   SARS Coronavirus 2 NEGATIVE NEGATIVE Final    Comment: (NOTE) SARS-CoV-2 target nucleic acids are NOT DETECTED.  The SARS-CoV-2 RNA is generally detectable in upper and lower respiratory specimens during the acute phase of infection. Negative results do not preclude SARS-CoV-2 infection, do not rule out co-infections with other pathogens, and should not be used as the sole basis for treatment or other patient management decisions. Negative results must be combined with clinical observations, patient  history, and epidemiological information. The expected result is Negative.  Fact Sheet for Patients: SugarRoll.be  Fact Sheet for Healthcare Providers: https://www.woods-mathews.com/  This test is not yet approved or cleared by the [place] FDA and  has been authorized for detection and/or diagnosis of SARS-CoV-2 by FDA under an Emergency Use Authorization (EUA). This EUA will remain  in effect (meaning this test can be used) for the duration of the COVID-19 declaration under Se ction 564(b)(1) of the Act, 21 U.S.C. section 360bbb-3(b)(1), unless the authorization is terminated or revoked sooner.  Performed at [facility], [place] [address], [place], [place] [address]   MRSA Next Gen by PCR, Nasal     Status: None   Collection Time: 04/23/21  6:57 AM   Specimen: Nasal Mucosa; Nasal Swab  Result Value Ref Range Status   MRSA by PCR Next Gen NOT DETECTED NOT DETECTED Final    Comment: (NOTE) The GeneXpert MRSA Assay (FDA approved for NASAL specimens only), is one component of a comprehensive MRSA colonization surveillance program. It is not intended to  diagnose MRSA infection nor to guide or monitor treatment for MRSA infections. Test performance is not FDA approved in patients less than 57 years old. Performed at [facility], [place] [address], [place], [place] [address]          Radiology Studies: NM Myocar Multi W/Spect Tamela Oddi Motion / EF  Result Date: 04/23/2021  There was no ST segment deviation noted during stress.  This is a low risk study.  Nuclear stress EF: 61%.  The left ventricular ejection fraction is normal (55-65%).  Normal perfusion. LVEF 61% with normal wall motion. This is a low risk study.        Scheduled Meds:  allopurinol  100 mg Oral Daily   diltiazem  60 mg Oral Q12H   ezetimibe  10 mg Oral QPM   flecainide  50 mg Oral Q12H   fluticasone  2 spray Each Nare BID   guaiFENesin  600 mg Oral BID   levothyroxine  75 mcg Oral QAC breakfast   mouth rinse  15 mL Mouth Rinse BID   metoprolol succinate  25 mg Oral QHS   polyethylene glycol  17 g Oral Daily   potassium chloride  20 mEq Oral Once   potassium chloride SA  40 mEq Oral Daily   rivaroxaban  15 mg Oral Daily   sertraline  50 mg Oral Daily   sodium chloride flush  3 mL Intravenous Q12H   torsemide  40 mg Oral Daily   Continuous Infusions:   LOS: 5 days   Time spent= 35 mins    Kimbely Whiteaker Arsenio Loader, MD [facility]  If 7PM-7AM, please contact night-coverage  04/25/2021, 9:11 AM

## 2021-04-25 NOTE — Progress Notes (Signed)
Progress Note  Patient Name: Cindy Cervantes Date of Encounter: 04/25/2021  [facility] Cardiologist: None Dr. [name]  Subjective   Denies any chest pain, SOB or palpitations.   Inpatient Medications    Scheduled Meds:  allopurinol  100 mg Oral Daily   diltiazem  60 mg Oral Q12H   ezetimibe  10 mg Oral QPM   flecainide  50 mg Oral Q12H   fluticasone  2 spray Each Nare BID   guaiFENesin  600 mg Oral BID   levothyroxine  75 mcg Oral QAC breakfast   mouth rinse  15 mL Mouth Rinse BID   metoprolol succinate  25 mg Oral QHS   polyethylene glycol  17 g Oral Daily   potassium chloride  20 mEq Oral Once   potassium chloride SA  40 mEq Oral Daily   rivaroxaban  15 mg Oral Daily   sertraline  50 mg Oral Daily   sodium chloride flush  3 mL Intravenous Q12H   torsemide  40 mg Oral Daily   Continuous Infusions:   PRN Meds: acetaminophen **OR** acetaminophen, bisacodyl, ipratropium-albuterol, ondansetron **OR** ondansetron (ZOFRAN) IV   Vital Signs    Vitals:   04/24/21 2250 04/25/21 0000 04/25/21 0415 04/25/21 0757  BP: 123/74 113/63 111/71 116/77  Pulse: 80 86 78 69  Resp: (!) 25 (!) '23 16 16  '$ Temp: 98.3 F (36.8 C) (!) 97.5 F (36.4 C) 98.2 F (36.8 C) 97.9 F (36.6 C)  TempSrc: Oral Oral Oral Oral  SpO2: 98% 96% 97% 96%  Weight:      Height:        Intake/Output Summary (Last 24 hours) at 04/25/2021 1010 Last data filed at 04/24/2021 1900 Gross per 24 hour  Intake --  Output 1950 ml  Net -1950 ml    Last 3 Weights 04/24/2021 04/23/2021 04/20/2021  Weight (lbs) 210 lb 15.7 oz 213 lb 6.5 oz 206 lb 9.1 oz  Weight (kg) 95.7 kg 96.8 kg 93.7 kg      Telemetry    Atrial fibrillation with CVR  - Personally Reviewed  ECG    N/a - Personally Reviewed  Physical Exam   VS:  BP 116/77 (BP Location: Right Arm)   Pulse 69   Temp 97.9 F (36.6 C) (Oral)   Resp 16   Ht '5\' 4"'$  (1.626 m)   Wt 95.7 kg   SpO2 96%   BMI 36.21 kg/m  , BMI Body mass index is 36.21  kg/m.  GEN: Well nourished, well developed in no acute distress HEENT: Normal NECK: No JVD; No carotid bruits LYMPHATICS: No lymphadenopathy CARDIAC:irregularly irregular, no murmurs, rubs, gallops RESPIRATORY:  Clear to auscultation without rales, wheezing or rhonchi  ABDOMEN: Soft, non-tender, non-distended MUSCULOSKELETAL:  No edema; No deformity  SKIN: Warm and dry NEUROLOGIC:  Alert and oriented x 3 PSYCHIATRIC:  Normal affect   Labs    High Sensitivity Troponin:   Recent Labs  Lab 04/20/21 0135 04/20/21 0508  TROPONINIHS 11 12       Chemistry Recent Labs  Lab 04/20/21 0135 04/20/21 1011 04/21/21 0144 04/24/21 0055 04/25/21 0453  NA 141   < > 142 140 139  K 4.6   < > 4.0 3.7 3.4*  CL 101  --  102 100 97*  CO2 36*  --  31 34* 33*  GLUCOSE 126*  --  89 116* 103*  BUN 20  --  '16 17 14  '$ CREATININE 1.55*  --  1.43* 1.48*  1.39*  CALCIUM 8.7*  --  8.8* 8.4* 8.5*  PROT 6.1*  --   --   --   --   ALBUMIN 3.0*  --   --   --   --   AST 23  --   --   --   --   ALT 10  --   --   --   --   ALKPHOS 80  --   --   --   --   BILITOT 1.0  --   --   --   --   GFRNONAA 32*  --  35* 34* 37*  ANIONGAP 4*  --  '9 6 9   '$ < > = values in this interval not displayed.      Hematology Recent Labs  Lab 04/21/21 0144 04/24/21 0055 04/25/21 0453  WBC 10.7* 8.2 7.6  RBC 3.57* 3.30* 3.24*  HGB 11.2* 10.3* 10.3*  HCT 37.6 34.4* 33.1*  MCV 105.3* 104.2* 102.2*  MCH 31.4 31.2 31.8  MCHC 29.8* 29.9* 31.1  RDW 13.9 13.9 13.7  PLT 177 155 154     BNP Recent Labs  Lab 04/20/21 0135  BNP 183.9*      DDimer No results for input(s): DDIMER in the last 168 hours.   Radiology    NM Myocar Multi W/Spect W/Wall Motion / EF  Result Date: 04/23/2021  There was no ST segment deviation noted during stress.  This is a low risk study.  Nuclear stress EF: 61%.  The left ventricular ejection fraction is normal (55-65%).  Normal perfusion. LVEF 61% with normal wall motion. This is a  low risk study.    Cardiac Studies   Echo 04/20/21:  1. Left ventricular ejection fraction, by estimation, is 60 to 65%. The  left ventricle has normal function. The left ventricle has no regional  wall motion abnormalities. Left ventricular diastolic function could not  be evaluated.   2. Right ventricular systolic function is normal. The right ventricular  size is normal. There is normal pulmonary artery systolic pressure.   3. The mitral valve is normal in structure. Trivial mitral valve  regurgitation.   4. The aortic valve is grossly normal. There is moderate calcification of  the aortic valve. There is moderate thickening of the aortic valve. Aortic  valve regurgitation is not visualized. Mild to moderate aortic valve  sclerosis/calcification is present,   without any evidence of aortic stenosis.   5. The inferior vena cava is normal in size with <50% respiratory  variability, suggesting right atrial pressure of 8 mmHg.   Patient Profile     87 y.o. female with persistent atrial fibrillation, sick sinus syndrome status post pacemaker, chronic diastolic heart failure, hypertension, hyperlipidemia, OSA, and GERD admitted with atrial flutter.  Assessment & Plan    # Atrial flutter: -s/p successful cardioversion on 7/20 and reverted back quickly into atrial flutter.   -she was given a dose of flecainide 150 mg but did not convert.   -she is now on Toprol XL '25mg'$  daily -[place] with no ischemia -She previously developed tremors on amiodarone -started on Flecainide '50mg'$  BID -Her diltiazem drip was discontinued and now on Cardizem SR '60mg'$  BID -continue Xarelto '15mg'$  daily, Cardizem SR '60mg'$  BID and Flecainide '50mg'$  BID -NPO after MN -Plan for repeat DCCV on Monday.   # Acute on chronic diastolic heart failure: -breathing better today  -Torsemide increased to '40mg'$  daily -She put out 1.95L yesterday and is net neg  2.9L since admit -weight down 3lbs from yesterday but up  5lbs from admit > accuracy of weights -SCr stable at 1.39 (down from 1.48 yesterday)     For questions or updates, please contact [place] Please consult [email or web address] for contact info under        Signed, Fransico Him, MD  04/25/2021, 10:10 AM

## 2021-04-26 ENCOUNTER — Encounter (HOSPITAL_COMMUNITY)
Admission: EM | Disposition: A | Discharge status: Nursing Home (Includes ICF) | Payer: Self-pay | Source: Skilled Nursing Facility | Attending: Internal Medicine

## 2021-04-26 ENCOUNTER — Inpatient Hospital Stay (HOSPITAL_COMMUNITY): Payer: Medicare Other | Admitting: Anesthesiology

## 2021-04-26 ENCOUNTER — Other Ambulatory Visit (HOSPITAL_COMMUNITY): Payer: Self-pay

## 2021-04-26 ENCOUNTER — Encounter (HOSPITAL_COMMUNITY): Payer: Self-pay | Admitting: Internal Medicine

## 2021-04-26 ENCOUNTER — Other Ambulatory Visit: Payer: Self-pay | Admitting: Physician Assistant

## 2021-04-26 DIAGNOSIS — I5032 Chronic diastolic (congestive) heart failure: Secondary | ICD-10-CM | POA: Diagnosis not present

## 2021-04-26 DIAGNOSIS — I4891 Unspecified atrial fibrillation: Secondary | ICD-10-CM | POA: Diagnosis not present

## 2021-04-26 HISTORY — PX: CARDIOVERSION: SHX1299

## 2021-04-26 LAB — MAGNESIUM: Magnesium: 2 mg/dL (ref 1.7–2.4)

## 2021-04-26 LAB — BASIC METABOLIC PANEL
Anion gap: 6 (ref 5–15)
BUN: 14 mg/dL (ref 8–23)
CO2: 38 mmol/L — ABNORMAL HIGH (ref 22–32)
Calcium: 8.6 mg/dL — ABNORMAL LOW (ref 8.9–10.3)
Chloride: 97 mmol/L — ABNORMAL LOW (ref 98–111)
Creatinine, Ser: 1.51 mg/dL — ABNORMAL HIGH (ref 0.44–1.00)
GFR, Estimated: 33 mL/min — ABNORMAL LOW (ref >60)
Glucose, Bld: 101 mg/dL — ABNORMAL HIGH (ref 70–99)
Potassium: 3.6 mmol/L (ref 3.5–5.1)
Sodium: 141 mmol/L (ref 135–145)

## 2021-04-26 LAB — CBC
HCT: 35.7 % — ABNORMAL LOW (ref 36.0–46.0)
Hemoglobin: 10.8 g/dL — ABNORMAL LOW (ref 12.0–15.0)
MCH: 31.5 pg (ref 26.0–34.0)
MCHC: 30.3 g/dL (ref 30.0–36.0)
MCV: 104.1 fL — ABNORMAL HIGH (ref 80.0–100.0)
Platelets: 176 10*3/uL (ref 150–400)
RBC: 3.43 MIL/uL — ABNORMAL LOW (ref 3.87–5.11)
RDW: 14 % (ref 11.5–15.5)
WBC: 8 10*3/uL (ref 4.0–10.5)
nRBC: 0 % (ref 0.0–0.2)

## 2021-04-26 LAB — RESP PANEL BY RT-PCR (FLU A&B, COVID) ARPGX2
Influenza A by PCR: NEGATIVE
Influenza B by PCR: NEGATIVE
SARS Coronavirus 2 by RT PCR: NEGATIVE

## 2021-04-26 LAB — PROTIME-INR
INR: 1.9 — ABNORMAL HIGH (ref 0.8–1.2)
Prothrombin Time: 21.4 seconds — ABNORMAL HIGH (ref 11.4–15.2)

## 2021-04-26 SURGERY — CARDIOVERSION
Anesthesia: General

## 2021-04-26 MED ORDER — TORSEMIDE 20 MG PO TABS
20.0000 mg | ORAL_TABLET | Freq: Every day | ORAL | Status: DC
  Administered 2021-04-26: 20 mg via ORAL
  Filled 2021-04-26: qty 1

## 2021-04-26 MED ORDER — LIDOCAINE 2% (20 MG/ML) 5 ML SYRINGE
INTRAMUSCULAR | Status: DC | PRN
  Administered 2021-04-26: 60 mg via INTRAVENOUS

## 2021-04-26 MED ORDER — DILTIAZEM HCL ER 60 MG PO CP12
60.0000 mg | ORAL_CAPSULE | Freq: Two times a day (BID) | ORAL | 0 refills | Status: AC
  Filled 2021-04-26: qty 60, 30d supply, fill #0

## 2021-04-26 MED ORDER — PROPOFOL 10 MG/ML IV BOLUS
INTRAVENOUS | Status: DC | PRN
  Administered 2021-04-26: 50 mg via INTRAVENOUS

## 2021-04-26 MED ORDER — FLECAINIDE ACETATE 50 MG PO TABS
50.0000 mg | ORAL_TABLET | Freq: Two times a day (BID) | ORAL | 0 refills | Status: DC
  Filled 2021-04-26: qty 60, 30d supply, fill #0

## 2021-04-26 MED ORDER — DILTIAZEM HCL 60 MG PO TABS
60.0000 mg | ORAL_TABLET | Freq: Two times a day (BID) | ORAL | 0 refills | Status: DC
  Filled 2021-04-26: qty 60, 30d supply, fill #0

## 2021-04-26 MED ORDER — SODIUM CHLORIDE 0.9 % IV SOLN: INTRAVENOUS | Status: DC | PRN

## 2021-04-26 NOTE — Anesthesia Postprocedure Evaluation (Signed)
Anesthesia Post Note  Patient: Cindy Cervantes  Procedure(s) Performed: CARDIOVERSION     Patient location during evaluation: Endoscopy Anesthesia Type: General Level of consciousness: awake and alert Pain management: pain level controlled Vital Signs Assessment: post-procedure vital signs reviewed and stable Respiratory status: spontaneous breathing, nonlabored ventilation, respiratory function stable and patient connected to nasal cannula oxygen Cardiovascular status: blood pressure returned to baseline and stable Postop Assessment: no apparent nausea or vomiting Anesthetic complications: no   No notable events documented.  Last Vitals:  Vitals:   04/26/21 0940 04/26/21 0950  BP: (!) 127/50 (!) 135/53  Pulse: (!) 59 62  Resp: 18 20  Temp:    SpO2: 99% 100%    Last Pain:  Vitals:   04/26/21 0950  TempSrc:   PainSc: 0-No pain                 Delany Steury S

## 2021-04-26 NOTE — Anesthesia Procedure Notes (Signed)
Procedure Name: General with mask airway Date/Time: 04/26/2021 9:16 AM Performed by: Kathryne Hitch, CRNA Pre-anesthesia Checklist: Patient identified, Emergency Drugs available, Suction available and Patient being monitored Patient Re-evaluated:Patient Re-evaluated prior to induction Oxygen Delivery Method: Simple face mask Preoxygenation: Pre-oxygenation with 100% oxygen Induction Type: IV induction Dental Injury: Teeth and Oropharynx as per pre-operative assessment

## 2021-04-26 NOTE — Transfer of Care (Signed)
Immediate Anesthesia Transfer of Care Note  Patient: Cindy Cervantes  Procedure(s) Performed: CARDIOVERSION  Patient Location: Endoscopy Unit  Anesthesia Type:General  Level of Consciousness: drowsy and patient cooperative  Airway & Oxygen Therapy: Patient Spontanous Breathing and Patient connected to face mask oxygen  Post-op Assessment: Report given to RN and Post -op Vital signs reviewed and stable  Post vital signs: Reviewed and stable  Last Vitals:  Vitals Value Taken Time  BP 96/43   Temp    Pulse 60   Resp 12   SpO2 100     Last Pain:  Vitals:   04/26/21 0857  TempSrc: Oral  PainSc: 0-No pain         Complications: No notable events documented.

## 2021-04-26 NOTE — CV Procedure (Signed)
   CARDIOVERSION NOTE  Procedure: Electrical Cardioversion Indications:  Atrial Flutter  Procedure Details:  Consent: Risks of procedure as well as the alternatives and risks of each were explained to the (patient/caregiver).  Consent for procedure obtained.  Time Out: Verified patient identification, verified procedure, site/side was marked, verified correct patient position, special equipment/implants available, medications/allergies/relevent history reviewed, required imaging and test results available.  Performed  Patient placed on cardiac monitor, pulse oximetry, supplemental oxygen as necessary.  Sedation given:  propofol per anesthesia Pacer pads placed anterior and posterior chest.  Cardioverted 1 time(s).  Cardioverted at 120Jbiphasic.  Impression: Findings: Post procedure EKG shows: NSR Complications: None Patient did tolerate procedure well.  Plan: Successful DCCV with a single 120J biphasic shock.  Time Spent Directly with the Patient:  30 minutes   Pixie Casino, MD, [facility], [place] Director of the [facility] Lipid [facility] &  Cardiovascular Risk Reduction [facility] Diplomate of the American Board of Clinical Lipidology Attending Cardiologist  Direct Dial: [phone]  Fax: [phone]  Website:  [email or web address] Tecia Cinnamon 04/26/2021, 9:29 AM

## 2021-04-26 NOTE — Progress Notes (Signed)
Progress Note  Patient Name: LERLENE PEACH Date of Encounter: 04/26/2021  [facility] Cardiologist: None Dr. [name]  Subjective   Doing well. Just finished DCCV back to A paced rhythm. Denies any chest pain or SOB.   Inpatient Medications    Scheduled Meds:  [MAR Hold] allopurinol  100 mg Oral Daily   [MAR Hold] diltiazem  60 mg Oral Q12H   [MAR Hold] ezetimibe  10 mg Oral QPM   [MAR Hold] flecainide  50 mg Oral Q12H   [MAR Hold] fluticasone  2 spray Each Nare BID   [MAR Hold] guaiFENesin  600 mg Oral BID   [MAR Hold] levothyroxine  75 mcg Oral QAC breakfast   [MAR Hold] mouth rinse  15 mL Mouth Rinse BID   [MAR Hold] metoprolol succinate  25 mg Oral QHS   [MAR Hold] polyethylene glycol  17 g Oral Daily   [MAR Hold] potassium chloride SA  40 mEq Oral Daily   [MAR Hold] rivaroxaban  15 mg Oral Daily   [MAR Hold] sertraline  50 mg Oral Daily   [MAR Hold] sodium chloride flush  3 mL Intravenous Q12H   [MAR Hold] torsemide  40 mg Oral Daily   Continuous Infusions:   PRN Meds: [MAR Hold] acetaminophen **OR** [MAR Hold] acetaminophen, [MAR Hold] bisacodyl, [MAR Hold] ipratropium-albuterol, [MAR Hold] ondansetron **OR** [MAR Hold] ondansetron (ZOFRAN) IV   Vital Signs    Vitals:   04/26/21 0857 04/26/21 0929 04/26/21 0935 04/26/21 0940  BP: (!) 150/51 (!) 96/43 (!) 117/51 (!) 127/50  Pulse: 83 60 (!) 59 (!) 59  Resp: '17 14 20 18  '$ Temp: 97.8 F (36.6 C) (!) 97.1 F (36.2 C)    TempSrc: Oral Oral    SpO2: 99% 100% 100% 99%  Weight: 96 kg     Height: '5\' 4"'$  (1.626 m)       Intake/Output Summary (Last 24 hours) at 04/26/2021 0942 Last data filed at 04/26/2021 0925 Gross per 24 hour  Intake 100 ml  Output 2000 ml  Net -1900 ml    Last 3 Weights 04/26/2021 04/26/2021 04/24/2021  Weight (lbs) 211 lb 10.3 oz 211 lb 10.3 oz 210 lb 15.7 oz  Weight (kg) 96 kg 96 kg 95.7 kg      Telemetry    Atrial paced with RBBB - Personally Reviewed  ECG    Atrial paced with  RBBB - Personally Reviewed  Physical Exam   VS:  BP (!) 127/50   Pulse (!) 59   Temp (!) 97.1 F (36.2 C) (Oral)   Resp 18   Ht '5\' 4"'$  (1.626 m)   Wt 96 kg   SpO2 99%   BMI 36.33 kg/m  , BMI Body mass index is 36.33 kg/m.  GEN: Well nourished, well developed in no acute distress HEENT: Normal NECK: No JVD; No carotid bruits LYMPHATICS: No lymphadenopathy CARDIAC:RRR, no murmurs, rubs, gallops RESPIRATORY:  Clear to auscultation without rales, wheezing or rhonchi  ABDOMEN: Soft, non-tender, non-distended MUSCULOSKELETAL:  No edema; No deformity  SKIN: Warm and dry NEUROLOGIC:  Alert and oriented x 3 PSYCHIATRIC:  Normal affect   Labs    High Sensitivity Troponin:   Recent Labs  Lab 04/20/21 0135 04/20/21 0508  TROPONINIHS 11 12       Chemistry Recent Labs  Lab 04/20/21 0135 04/20/21 1011 04/24/21 0055 04/25/21 0453 04/26/21 0210  NA 141   < > 140 139 141  K 4.6   < > 3.7 3.4*  3.6  CL 101   < > 100 97* 97*  CO2 36*   < > 34* 33* 38*  GLUCOSE 126*   < > 116* 103* 101*  BUN 20   < > '17 14 14  '$ CREATININE 1.55*   < > 1.48* 1.39* 1.51*  CALCIUM 8.7*   < > 8.4* 8.5* 8.6*  PROT 6.1*  --   --   --   --   ALBUMIN 3.0*  --   --   --   --   AST 23  --   --   --   --   ALT 10  --   --   --   --   ALKPHOS 80  --   --   --   --   BILITOT 1.0  --   --   --   --   GFRNONAA 32*   < > 34* 37* 33*  ANIONGAP 4*   < > '6 9 6   '$ < > = values in this interval not displayed.      Hematology Recent Labs  Lab 04/24/21 0055 04/25/21 0453 04/26/21 0210  WBC 8.2 7.6 8.0  RBC 3.30* 3.24* 3.43*  HGB 10.3* 10.3* 10.8*  HCT 34.4* 33.1* 35.7*  MCV 104.2* 102.2* 104.1*  MCH 31.2 31.8 31.5  MCHC 29.9* 31.1 30.3  RDW 13.9 13.7 14.0  PLT 155 154 176     BNP Recent Labs  Lab 04/20/21 0135  BNP 183.9*      DDimer No results for input(s): DDIMER in the last 168 hours.   Radiology    No results found.  Cardiac Studies   Echo 04/20/21:  1. Left ventricular ejection  fraction, by estimation, is 60 to 65%. The  left ventricle has normal function. The left ventricle has no regional  wall motion abnormalities. Left ventricular diastolic function could not  be evaluated.   2. Right ventricular systolic function is normal. The right ventricular  size is normal. There is normal pulmonary artery systolic pressure.   3. The mitral valve is normal in structure. Trivial mitral valve  regurgitation.   4. The aortic valve is grossly normal. There is moderate calcification of  the aortic valve. There is moderate thickening of the aortic valve. Aortic  valve regurgitation is not visualized. Mild to moderate aortic valve  sclerosis/calcification is present,   without any evidence of aortic stenosis.   5. The inferior vena cava is normal in size with <50% respiratory  variability, suggesting right atrial pressure of 8 mmHg.   Patient Profile     87 y.o. female with persistent atrial fibrillation, sick sinus syndrome status post pacemaker, chronic diastolic heart failure, hypertension, hyperlipidemia, OSA, and GERD admitted with atrial flutter.  Assessment & Plan    # Atrial flutter: -s/p successful cardioversion on 7/20 and reverted back quickly into atrial flutter.   -she was given a dose of flecainide 150 mg but did not convert.   -she is now on Toprol XL '25mg'$  daily -[employer] with no ischemia -She previously developed tremors on amiodarone -started on Flecainide '50mg'$  BID -Her diltiazem drip was discontinued and now on Cardizem SR '60mg'$  BID -continue Xarelto '15mg'$  daily, Cardizem SR '60mg'$  BID and Flecainide '50mg'$  BID -s/p DCCV this am to A paced rhythm  # Acute on chronic diastolic heart failure: -SOB has resolved -she continues to diurese well on PO torsemide -she put out Torsemide increased to '40mg'$  daily -She put out 2L yesterday  and is net neg 4.8L since admit -weights are inaccurate -SCr trending upward (1.39>1.51 today) -will decrease demadex  back to home dose of '20mg'$  daily now that she is out of afib  [facility] will sign off.   Medication Recommendations:  Xarelto '15mg'$  daily, Cardizem SR '60mg'$  BID, Flecainide '50mg'$  BID, Toprol XL '25mg'$  daily, Demadex '20mg'$  daily, Kdur 59mq daily Other recommendations (labs, testing, etc):  BMET 1 week Follow up as an outpatient:  Followup with  Dr. KCaryl Comesor PA in 2 weeks     For questions or updates, please contact [place]Please consult [email or web address] for contact info under        Signed, TFransico Him MD  04/26/2021, 9:42 AM

## 2021-04-26 NOTE — Interval H&P Note (Signed)
History and Physical Interval Note:  04/26/2021 8:58 AM  Cindy Cervantes  has presented today for surgery, with the diagnosis of AFIB.  The various methods of treatment have been discussed with the patient and family. After consideration of risks, benefits and other options for treatment, the patient has consented to  Procedure(s): CARDIOVERSION (N/A) as a surgical intervention.  The patient's history has been reviewed, patient examined, no change in status, stable for surgery.  I have reviewed the patient's chart and labs.  Questions were answered to the patient's satisfaction.     Pixie Casino

## 2021-04-26 NOTE — TOC Initial Note (Addendum)
Transition of Care [facility]) - Initial/Assessment Note    Patient Details  Name: Cindy Cervantes MRN: [id number] Date of Birth: 10/17/1931  [facility]) CM/SW Contact:    Benard Halsted, LCSW Phone Number: 04/26/2021, 1:01 PM  Clinical Narrative:                 Patient resides at [facility] and will return there at discharge. CSW spoke with RN, Rollene Fare there and she stated patient was on oxygen PRN at the facility. CSW will fax FL2 and DC Summary to f. [phone] at discharge. Requested COVID test.   Patient reported that her family is picking up her oxygen from the facility and will pick her up by car.   Expected Discharge Plan: Assisted Living Barriers to Discharge: Continued Medical Work up   Patient Goals and CMS Choice Patient states their goals for this hospitalization and ongoing recovery are:: Return to ALF CMS Medicare.gov Compare Post Acute Care list provided to:: Patient Choice offered to / list presented to : Patient  Expected Discharge Plan and Services Expected Discharge Plan: Assisted Living In-house Referral: Clinical Social Work   Post Acute Care Choice: [place] arrangements for the past 2 months: [place]                                      Prior Living Arrangements/Services Living arrangements for the past 2 months: [place] Lives with:: Facility Resident Patient language and need for interpreter reviewed:: Yes Do you feel safe going back to the place where you live?: Yes      Need for Family Participation in Patient Care: No (Comment) Care giver support system in place?: Yes (comment) Current home services: DME (PRN oxygen) Criminal Activity/Legal Involvement Pertinent to Current Situation/Hospitalization: No - Comment as needed  Activities of Daily Living      Permission Sought/Granted Permission sought to share information with : Facility Sport and exercise psychologist, Family  Supports Permission granted to share information with : Yes, Verbal Permission Granted     Permission granted to share info w AGENCY: [facility]        Emotional Assessment Appearance:: Appears stated age Attitude/Demeanor/Rapport: Engaged Affect (typically observed): Accepting, Appropriate Orientation: : Oriented to Self, Oriented to  Time, Oriented to Place, Oriented to Situation Alcohol / Substance Use: Not Applicable Psych Involvement: No (comment)  Admission diagnosis:  Acute respiratory failure with hypoxia ([place]) [J96.01] Patient Active Problem List   Diagnosis Date Noted   Atrial fibrillation with rapid ventricular response ([place])    Acute respiratory failure with hypoxia ([place]) 04/20/2021   Typical atrial flutter ([facility])    (HFpEF) heart failure with preserved ejection fraction ([place]) 09/22/2020   Palpitations 09/22/2020   Pressure injury of right buttock, unstageable ([place]) 04/10/2019   Chronic respiratory failure with hypoxia ([place]) 04/10/2019   Right lower quadrant abdominal mass 04/10/2019   Hypercoagulable state due to atrial fibrillation ([place]) 09/11/2017   Word finding difficulty 09/06/2017   Chronic fatigue 09/06/2017   History of shingles 09/06/2017   OSA treated with BiPAP 03/09/2016   Hyperglycemia 03/09/2016   Cerumen impaction 03/09/2016   Benign essential tremor 03/03/2014   Abdominal pain, other specified site 11/13/2013   Rosacea 05/15/2013   Hypothyroidism 02/08/2013   GERD (gastroesophageal reflux disease) 02/08/2013   Health care maintenance 02/08/2013   Constipation    Obesity    Anemia of  renal disease 10/21/2011   Anemia, iron deficiency 10/21/2011   MDS (myelodysplastic syndrome), low grade ([place]) 10/21/2011   Pacemaker-[employer] 01/18/2011   SLEEP RELATED HYPOVENTILATION/HYPOXEMIA CCE 12/14/2010   Tracheobronchomalacia 12/14/2010   Long term current use of anticoagulant 11/22/2010   Chronic diastolic heart failure ([place]) 11/15/2010   EDEMA  10/07/2010   SICK SINUS SYNDROME 10/01/2010   Atrial fibrillation ([place]) 09/21/2010   Hyperlipidemia 02/10/2010   Essential hypertension 02/10/2010   DYSPNEA 02/10/2010   Cough 02/10/2010   PCP:  Virgie Dad, MD Pharmacy:   [facility], [place] [address] Phone: [phone] Fax: [phone]  [id number] - [place], [place] - [address] [place] [address] Phone: [phone] Fax: [place], [place] [address] [place] [address] Phone: [phone] Fax: [phone]     Social Determinants of Health (SDOH) Interventions    Readmission Risk Interventions No flowsheet data found.

## 2021-04-26 NOTE — Discharge Summary (Addendum)
Physician Discharge Summary  Cindy Cervantes [id number] DOB: Feb 09, 1932 DOA: 04/20/2021  PCP: Virgie Dad, MD  Admit date: 04/20/2021 Discharge date: 04/26/2021  Admitted From: ALF Disposition:  ALF  Recommendations for Outpatient Follow-up:  Follow up with PCP in 1-2 weeks Please obtain BMP/CBC in one week your next doctors visit.  Xarelto '15mg'$  daily, Cardizem SR '60mg'$  BID, Flecainide '50mg'$  BID, Toprol XL '25mg'$  daily, Demadex '20mg'$  daily, Kdur 64mq daily  Home Health: PT Equipment/Devices: None Discharge Condition: Stable CODE STATUS: DNR Diet recommendation: Heart healthy  Brief/Interim Summary:   87year old female with history of HTN, HLD, PAF on anticoagulation, chronic respiratory failure on 2 L oxygen [place], SSS status post pacemaker, chronic diastolic CHF comes into the hospital with complaints of chest pains as well as progressive shortness of breath.  Cardiology was consulted.  On interrogation of her device she was noted to be in a flutter more prevalent in the last month, possibly explain her symptoms.  She was initially placed on Cardizem infusion, status post cardioversion on 7/20, a flutter recurred 7/21.  Plans for repeat cardioversion 7/25 which was successful now in A paced rhythm. Cardiac meds as mentioned above.  Son and daughter at bedside during my discharge visit, all the questions answered. Stable for dc   Assessment & Plan:   Active Problems:   Long term current use of anticoagulant   Chronic diastolic heart failure (HCC)   Obesity   Hypothyroidism   OSA treated with BiPAP   Chronic respiratory failure with hypoxia (HCC)   Acute respiratory failure with hypoxia (HCC)   Atrial fibrillation with rapid ventricular response (HCC)     PAF/flutter, with RVR, currently rate controlled -Initial successful cardioversion 7/20 but now back into a flutter - Flecainide 50 mg twice daily.   Toprol 25 mg qhs, Cardizem '60mg'$  q12hrs - s/p Cardioversion today, now in  A paced rhythm. Cont Xarelto   Acute diastolic congestive heart failure, class III -Continue gentle diuresis with torsemide 40 mg daily   Sick sinus syndrome status post pacemaker-battery will need to be replaced as an outpatient   CKD 3b -currently her creatinine is at baseline   Anemia of chronic disease-hemoglobin stable   Hypothyroidism-continue Synthroid TSH normal at 2.5   Obesity, class II-BMI 35.5, she would benefit from weight loss   HLD - continue home regimen    Body mass index is 36.33 kg/m.         Discharge Diagnoses:  Active Problems:   Long term current use of anticoagulant   Chronic diastolic heart failure (HCC)   Obesity   Hypothyroidism   OSA treated with BiPAP   Chronic respiratory failure with hypoxia (HCC)   Acute respiratory failure with hypoxia (HCC)   Atrial fibrillation with rapid ventricular response ([facility]      Consultations: Cardiology  Subjective: Tolerated cardioversion, doing well.   Discharge Exam: Vitals:   04/26/21 0950 04/26/21 1212  BP: (!) 135/53 (!) 124/58  Pulse: 62 61  Resp: 20 15  Temp:  97.6 F (36.4 C)  SpO2: 100% 100%   Vitals:   04/26/21 0935 04/26/21 0940 04/26/21 0950 04/26/21 1212  BP: (!) 117/51 (!) 127/50 (!) 135/53 (!) 124/58  Pulse: (!) 59 (!) 59 62 61  Resp: '20 18 20 15  '$ Temp:    97.6 F (36.4 C)  TempSrc:    Oral  SpO2: 100% 99% 100% 100%  Weight:      Height:  General: Pt is alert, awake, not in acute distress Cardiovascular: RRR, S1/S2 +, no rubs, no gallops Respiratory: CTA bilaterally, no wheezing, no rhonchi Abdominal: Soft, NT, ND, bowel sounds + Extremities: no edema, no cyanosis  Discharge Instructions   Allergies as of 04/26/2021       Reactions   Codeine    Extreme lethargy   Iron    By infusion - back spasms    Morphine    Hallucinations, altered mental state   Toviaz [fesoterodine Fumarate]    hyperactive        Medication List     TAKE these  medications    acetaminophen 325 MG tablet Commonly known as: TYLENOL Take 325-650 mg by mouth every 4 (four) hours as needed for moderate pain.   allopurinol 100 MG tablet Commonly known as: ZYLOPRIM Take 100 mg by mouth daily.   bisacodyl 10 MG suppository Commonly known as: DULCOLAX Place 10 mg rectally as needed for moderate constipation.   dextromethorphan-guaiFENesin 30-600 MG 12hr tablet Commonly known as: MUCINEX DM Take 1 tablet by mouth 2 (two) times daily as needed (cough / congestion).   diltiazem 60 MG 12 hr capsule Commonly known as: CARDIZEM SR Take 1 capsule (60 mg total) by mouth 2 (two) times daily.   ezetimibe 10 MG tablet Commonly known as: ZETIA Take 10 mg by mouth every evening.   flecainide 50 MG tablet Commonly known as: TAMBOCOR Take 1 tablet (50 mg total) by mouth every 12 (twelve) hours.   ipratropium-albuterol 0.5-2.5 (3) MG/3ML Soln Commonly known as: DUONEB Take 3 mLs by nebulization every 6 (six) hours as needed (Shortness of Breath/Wheezing).   levothyroxine 75 MCG tablet Commonly known as: SYNTHROID Take 75 mcg by mouth daily before breakfast.   metoprolol succinate 25 MG 24 hr tablet Commonly known as: Toprol XL Take 1 tablet (25 mg total) by mouth at bedtime.   polyethylene glycol powder 17 GM/SCOOP powder Commonly known as: GLYCOLAX/MIRALAX Take 17 g by mouth daily as needed for mild constipation.   potassium chloride SA 20 MEQ tablet Commonly known as: KLOR-CON Take 40 mEq by mouth daily.   Rivaroxaban 15 MG Tabs tablet Commonly known as: Xarelto Take 1 tablet (15 mg total) by mouth daily with supper.   sertraline 50 MG tablet Commonly known as: ZOLOFT Take 1 tablet (50 mg total) by mouth daily.   torsemide 20 MG tablet Commonly known as: DEMADEX Take 20 mg by mouth daily.       ASK your doctor about these medications    fluticasone 50 MCG/ACT nasal spray Commonly known as: FLONASE Place 2 sprays into the nose 2  (two) times daily. 2 sprays in each nostril        Follow-up Information     Virgie Dad, MD Follow up.   Specialty: Internal Medicine Contact information: [place] [address] [phone]         Deboraha Sprang, MD .   Specialty: Cardiology Contact information: [phone] [address] [place] [address] [phone]         Virgie Dad, MD Follow up.   Specialty: Internal Medicine Contact information: [place] [address] [phone]         [facility] Follow up on 05/03/2021.   Specialty: Cardiology Why: Come to the office for blood work. Do not have to be fasting, can take usual am medications. Contact information: [place]  [address], [place] [address] [phone]               Allergies  Allergen Reactions   Codeine     Extreme lethargy   Iron     By infusion - back spasms    Morphine     Hallucinations, altered mental state   Toviaz [Fesoterodine Fumarate]     hyperactive    You were cared for by a hospitalist during your hospital stay. If you have any questions about your discharge medications or the care you received while you were in the hospital after you are discharged, you can call the unit and asked to speak with the hospitalist on call if the hospitalist that took care of you is not available. Once you are discharged, your primary care physician will handle any further medical issues. Please note that no refills for any discharge medications will be authorized once you are discharged, as it is imperative that you return to your primary care physician (or establish a relationship with a primary care physician if you do not have one) for your aftercare needs so that they can reassess your need for medications and monitor your lab values.   Procedures/Studies: CT Angio Chest PE W and/or Wo Contrast  Result Date: 04/20/2021 CLINICAL  DATA:  High probability for pulmonary embolism. EXAM: CT ANGIOGRAPHY CHEST WITH CONTRAST TECHNIQUE: Multidetector CT imaging of the chest was performed using the standard protocol during bolus administration of intravenous contrast. Multiplanar CT image reconstructions and MIPs were obtained to evaluate the vascular anatomy. CONTRAST:  68m OMNIPAQUE IOHEXOL 350 MG/ML SOLN COMPARISON:  10/27/2010 FINDINGS: Cardiovascular: Satisfactory opacification of the pulmonary arteries to the segmental level. No evidence of pulmonary embolism. Enlarged heart. Dual-chamber pacer leads in place. Atheromatous calcification of the aorta. Mediastinum/Nodes: Negative for hematoma or visible inflammation Lungs/Pleura: Very elevated right diaphragm and generally low volume chest. The bronchus intermedius is narrowed with partial opacification. Mild atelectatic type densities at the left base. There is a band of atelectasis at the right base. No edema or consolidation Upper Abdomen: No acute finding. Peripheral gallbladder calcification similar to prior. Given the occasional crescentic morphology this is at least partially related to calculi ; there could also be some mural calcification. Musculoskeletal: No acute finding Review of the MIP images confirms the above findings. IMPRESSION: 1. Negative for pulmonary embolism. 2. Low volume chest with chronic marked elevation of the right diaphragm and atelectasis. Bronchus intermedius stenosis/collapse, likely chronic. 3. Cholelithiasis. There may also be gallbladder wall calcification. Electronically Signed   By: JMonte FantasiaM.D.   On: 04/20/2021 04:34   NM Myocar Multi W/Spect W/Wall Motion / EF  Result Date: 04/23/2021  There was no ST segment deviation noted during stress.  This is a low risk study.  Nuclear stress EF: 61%.  The left ventricular ejection fraction is normal (55-65%).  Normal perfusion. LVEF 61% with normal wall motion. This is a low risk study.   DG CHEST  PORT 1 VIEW  Result Date: 04/21/2021 CLINICAL DATA:  Cough and congestion following cardioversion EXAM: PORTABLE CHEST 1 VIEW COMPARISON:  04/20/2021 FINDINGS: Cardiac shadow is stable. Pacemaker is again seen and stable. Aortic calcifications are noted. Elevation of the right hemidiaphragm is noted. Mild bibasilar atelectasis is seen. No acute bony abnormality is noted. IMPRESSION: Mild bibasilar atelectasis.  No other focal abnormality is seen. Electronically Signed   By: MInez CatalinaM.D.   On: 04/21/2021 16:42   DG Chest Portable  1 View  Result Date: 04/20/2021 CLINICAL DATA:  Left chest pain, AFib with RVR EXAM: PORTABLE CHEST 1 VIEW COMPARISON:  09/05/2012 FINDINGS: Low lung volumes. No focal consolidation. No pleural effusion or pneumothorax. Chronic eventration of right hemidiaphragm. The heart is normal in size. Thoracic aortic atherosclerosis. Left subclavian pacemaker. IMPRESSION: Low lung volumes with chronic eventration of the right hemidiaphragm. No evidence of acute cardiopulmonary disease. Electronically Signed   By: Julian Hy M.D.   On: 04/20/2021 01:50   ECHOCARDIOGRAM COMPLETE  Result Date: 04/20/2021    ECHOCARDIOGRAM REPORT   Patient Name:   Cindy Cervantes Date of Exam: 04/20/2021 Medical Rec #:  [id number]       Height:       64.0 in Accession #:    [id number]      Weight:       206.6 lb Date of Birth:  [birth year]       BSA:          1.983 m Patient Age:    63 years        BP:           128/72 mmHg Patient Gender: F               HR:           107 bpm. Exam Location:  Inpatient Procedure: 2D Echo, Color Doppler and Cardiac Doppler Indications:    I48.91* Unspecified atrial fibrillation  History:        Patient has prior history of Echocardiogram examinations, most                 recent 12/06/2017. CHF, Pacemaker, Arrythmias:Atrial Fibrillation;                 Risk Factors:Hypertension, Dyslipidemia and Sleep Apnea.  Sonographer:    Raquel Sarna Senior RDCS Referring Phys: [id number]  [place]  1. Left ventricular ejection fraction, by estimation, is 60 to 65%. The left ventricle has normal function. The left ventricle has no regional wall motion abnormalities. Left ventricular diastolic function could not be evaluated.  2. Right ventricular systolic function is normal. The right ventricular size is normal. There is normal pulmonary artery systolic pressure.  3. The mitral valve is normal in structure. Trivial mitral valve regurgitation.  4. The aortic valve is grossly normal. There is moderate calcification of the aortic valve. There is moderate thickening of the aortic valve. Aortic valve regurgitation is not visualized. Mild to moderate aortic valve sclerosis/calcification is present,  without any evidence of aortic stenosis.  5. The inferior vena cava is normal in size with <50% respiratory variability, suggesting right atrial pressure of 8 mmHg. FINDINGS  Left Ventricle: Left ventricular ejection fraction, by estimation, is 60 to 65%. The left ventricle has normal function. The left ventricle has no regional wall motion abnormalities. The left ventricular internal cavity size was normal in size. There is  no left ventricular hypertrophy. Left ventricular diastolic function could not be evaluated due to atrial fibrillation. Left ventricular diastolic function could not be evaluated. Right Ventricle: The right ventricular size is normal. No increase in right ventricular wall thickness. Right ventricular systolic function is normal. There is normal pulmonary artery systolic pressure. The tricuspid regurgitant velocity is 2.05 m/s, and  with an assumed right atrial pressure of 8 mmHg, the estimated right ventricular systolic pressure is 123XX123 mmHg. Left Atrium: Left atrial size was normal in size. Right Atrium: Right atrial size was normal in size.  Pericardium: There is no evidence of pericardial effusion. Mitral Valve: The mitral valve is normal in structure. Trivial mitral  valve regurgitation. Tricuspid Valve: The tricuspid valve is normal in structure. Tricuspid valve regurgitation is mild. Aortic Valve: The aortic valve is grossly normal. There is moderate calcification of the aortic valve. There is moderate thickening of the aortic valve. Aortic valve regurgitation is not visualized. Mild to moderate aortic valve sclerosis/calcification is present, without any evidence of aortic stenosis. Pulmonic Valve: The pulmonic valve was not well visualized. Pulmonic valve regurgitation is not visualized. No evidence of pulmonic stenosis. Aorta: The aortic root and ascending aorta are structurally normal, with no evidence of dilitation. Venous: The inferior vena cava is normal in size with less than 50% respiratory variability, suggesting right atrial pressure of 8 mmHg. IAS/Shunts: No atrial level shunt detected by color flow Doppler. Additional Comments: A device lead is visualized in the right ventricle.  LEFT VENTRICLE PLAX 2D LVIDd:         4.90 cm  Diastology LVIDs:         2.90 cm  LV e' medial:    6.64 cm/s LV PW:         1.10 cm  LV E/e' medial:  19.3 LV IVS:        0.70 cm  LV e' lateral:   8.59 cm/s LVOT diam:     1.90 cm  LV E/e' lateral: 14.9 LV SV:         37 LV SV Index:   19 LVOT Area:     2.84 cm  RIGHT VENTRICLE RV S prime:     6.96 cm/s TAPSE (M-mode): 1.8 cm LEFT ATRIUM             Index       RIGHT ATRIUM           Index LA diam:        3.70 cm 1.87 cm/m  RA Area:     14.30 cm LA Vol (A2C):   48.9 ml 24.65 ml/m RA Volume:   30.20 ml  15.23 ml/m LA Vol (A4C):   46.7 ml 23.55 ml/m LA Biplane Vol: 49.0 ml 24.70 ml/m  AORTIC VALVE LVOT Vmax:   65.80 cm/s LVOT Vmean:  46.500 cm/s LVOT VTI:    0.130 m  AORTA Ao Root diam: 2.90 cm Ao Asc diam:  3.50 cm MITRAL VALVE                TRICUSPID VALVE MV Area (PHT): 3.60 cm     TR Peak grad:   16.8 mmHg MV Decel Time: 211 msec     TR Vmax:        205.00 cm/s MV E velocity: 128.00 cm/s MV A velocity: 41.40 cm/s   SHUNTS MV E/A  ratio:  3.09         Systemic VTI:  0.13 m                             Systemic Diam: 1.90 cm Dani Gobble Croitoru MD Electronically signed by Sanda Klein MD Signature Date/Time: 04/20/2021/4:24:52 PM    Final    CUP PACEART REMOTE DEVICE CHECK  Result Date: 04/20/2021 Scheduled monthly (MOD) remote reviewed. Normal device function.  Battery longevity <3 months. Battery voltage 2.63V AF burden 17%, AF on presenting rhythm with onset on 03/28/21. Rates controlled. [place]- Xarelto Routing for further review and management Next remote 91 days-  JBox, RN/CVRS    The results of significant diagnostics from this hospitalization (including imaging, microbiology, ancillary and laboratory) are listed below for reference.     Microbiology: Recent Results (from the past 240 hour(s))  SARS CORONAVIRUS 2 (TAT 6-24 HRS) Nasopharyngeal Nasopharyngeal Swab     Status: None   Collection Time: 04/20/21  6:58 AM   Specimen: Nasopharyngeal Swab  Result Value Ref Range Status   SARS Coronavirus 2 NEGATIVE NEGATIVE Final    Comment: (NOTE) SARS-CoV-2 target nucleic acids are NOT DETECTED.  The SARS-CoV-2 RNA is generally detectable in upper and lower respiratory specimens during the acute phase of infection. Negative results do not preclude SARS-CoV-2 infection, do not rule out co-infections with other pathogens, and should not be used as the sole basis for treatment or other patient management decisions. Negative results must be combined with clinical observations, patient history, and epidemiological information. The expected result is Negative.  Fact Sheet for Patients: SugarRoll.be  Fact Sheet for Healthcare Providers: https://www.woods-mathews.com/  This test is not yet approved or cleared by the [place] FDA and  has been authorized for detection and/or diagnosis of SARS-CoV-2 by FDA under an Emergency Use Authorization (EUA). This EUA will remain  in effect  (meaning this test can be used) for the duration of the COVID-19 declaration under Se ction 564(b)(1) of the Act, 21 U.S.C. section 360bbb-3(b)(1), unless the authorization is terminated or revoked sooner.  Performed at [facility], [place] [address] [place], [place] [address]   MRSA Next Gen by PCR, Nasal     Status: None   Collection Time: 04/23/21  6:57 AM   Specimen: Nasal Mucosa; Nasal Swab  Result Value Ref Range Status   MRSA by PCR Next Gen NOT DETECTED NOT DETECTED Final    Comment: (NOTE) The GeneXpert MRSA Assay (FDA approved for NASAL specimens only), is one component of a comprehensive MRSA colonization surveillance program. It is not intended to diagnose MRSA infection nor to guide or monitor treatment for MRSA infections. Test performance is not FDA approved in patients less than 52 years old. Performed at [facility], [place] [address] [place], [place] [address]   Resp Panel by RT-PCR (Flu A&B, Covid) Nasopharyngeal Swab     Status: None   Collection Time: 04/26/21  2:09 PM   Specimen: Nasopharyngeal Swab; Nasopharyngeal(NP) swabs in vial transport medium  Result Value Ref Range Status   SARS Coronavirus 2 by RT PCR NEGATIVE NEGATIVE Final    Comment: (NOTE) SARS-CoV-2 target nucleic acids are NOT DETECTED.  The SARS-CoV-2 RNA is generally detectable in upper respiratory specimens during the acute phase of infection. The lowest concentration of SARS-CoV-2 viral copies this assay can detect is 138 copies/mL. A negative result does not preclude SARS-Cov-2 infection and should not be used as the sole basis for treatment or other patient management decisions. A negative result may occur with  improper specimen collection/handling, submission of specimen other than nasopharyngeal swab, presence of viral mutation(s) within the areas targeted by this assay, and inadequate number of viral copies(<138 copies/mL). A negative result must be combined  with clinical observations, patient history, and epidemiological information. The expected result is Negative.  Fact Sheet for Patients:  EntrepreneurPulse.com.au  Fact Sheet for Healthcare Providers:  IncredibleEmployment.be  This test is no t yet approved or cleared by the [place] FDA and  has been authorized for detection and/or diagnosis of SARS-CoV-2 by FDA under an Emergency Use Authorization (EUA). This EUA  will remain  in effect (meaning this test can be used) for the duration of the COVID-19 declaration under Section 564(b)(1) of the Act, 21 U.S.C.section 360bbb-3(b)(1), unless the authorization is terminated  or revoked sooner.       Influenza A by PCR NEGATIVE NEGATIVE Final   Influenza B by PCR NEGATIVE NEGATIVE Final    Comment: (NOTE) The Xpert Xpress SARS-CoV-2/FLU/RSV plus assay is intended as an aid in the diagnosis of influenza from Nasopharyngeal swab specimens and should not be used as a sole basis for treatment. Nasal washings and aspirates are unacceptable for Xpert Xpress SARS-CoV-2/FLU/RSV testing.  Fact Sheet for Patients: EntrepreneurPulse.com.au  Fact Sheet for Healthcare Providers: IncredibleEmployment.be  This test is not yet approved or cleared by the [place] FDA and has been authorized for detection and/or diagnosis of SARS-CoV-2 by FDA under an Emergency Use Authorization (EUA). This EUA will remain in effect (meaning this test can be used) for the duration of the COVID-19 declaration under Section 564(b)(1) of the Act, 21 U.S.C. section 360bbb-3(b)(1), unless the authorization is terminated or revoked.  Performed at [facility], [place] [address] [place], [place] [address]      Labs: BNP (last 3 results) Recent Labs    04/20/21 0135  BNP [id number]*   Basic Metabolic Panel: Recent Labs  Lab 04/20/21 0135 04/20/21 0840 04/20/21 1011  04/21/21 0144 04/24/21 0055 04/25/21 0453 04/26/21 0210  NA 141  --  141 142 140 139 141  K 4.6  --  5.0 4.0 3.7 3.4* 3.6  CL 101  --   --  102 100 97* 97*  CO2 36*  --   --  31 34* 33* 38*  GLUCOSE 126*  --   --  89 116* 103* 101*  BUN 20  --   --  '16 17 14 14  '$ CREATININE 1.55*  --   --  1.43* 1.48* 1.39* 1.51*  CALCIUM 8.7*  --   --  8.8* 8.4* 8.5* 8.6*  MG  --  2.2  --   --   --  2.0 2.0   Liver Function Tests: Recent Labs  Lab 04/20/21 0135  AST 23  ALT 10  ALKPHOS 80  BILITOT 1.0  PROT 6.1*  ALBUMIN 3.0*   No results for input(s): LIPASE, AMYLASE in the last 168 hours. No results for input(s): AMMONIA in the last 168 hours. CBC: Recent Labs  Lab 04/20/21 0135 04/20/21 1011 04/21/21 0144 04/24/21 0055 04/25/21 0453 04/26/21 0210  WBC 9.6  --  10.7* 8.2 7.6 8.0  NEUTROABS 7.4  --   --   --   --   --   HGB 10.5* 11.2* 11.2* 10.3* 10.3* 10.8*  HCT 35.5* 33.0* 37.6 34.4* 33.1* 35.7*  MCV 105.7*  --  105.3* 104.2* 102.2* 104.1*  PLT 188  --  177 155 154 176   Cardiac Enzymes: No results for input(s): CKTOTAL, CKMB, CKMBINDEX, TROPONINI in the last 168 hours. BNP: Invalid input(s): POCBNP CBG: No results for input(s): GLUCAP in the last 168 hours. D-Dimer No results for input(s): DDIMER in the last 72 hours. Hgb A1c No results for input(s): HGBA1C in the last 72 hours. Lipid Profile No results for input(s): CHOL, HDL, LDLCALC, TRIG, CHOLHDL, LDLDIRECT in the last 72 hours. Thyroid function studies No results for input(s): TSH, T4TOTAL, T3FREE, THYROIDAB in the last 72 hours.  Invalid input(s): FREET3 Anemia work up No results for input(s): VITAMINB12, FOLATE, FERRITIN, TIBC, IRON, RETICCTPCT in  the last 72 hours. Urinalysis    Component Value Date/Time   COLORURINE AMBER BIOCHEMICALS MAY BE AFFECTED BY COLOR (A) 09/27/2010 1537   APPEARANCEUR CLOUDY (A) 09/27/2010 1537   LABSPEC 1.021 09/27/2010 1537   PHURINE 5.5 09/27/2010 1537   GLUCOSEU NEGATIVE  09/27/2010 1537   HGBUR NEGATIVE 09/27/2010 1537   BILIRUBINUR MODERATE (A) 09/27/2010 1537   KETONESUR NEGATIVE 09/27/2010 1537   PROTEINUR 30 (A) 09/27/2010 1537   UROBILINOGEN 4.0 (H) 09/27/2010 1537   NITRITE POSITIVE (A) 09/27/2010 1537   LEUKOCYTESUR SMALL (A) 09/27/2010 1537   Sepsis Labs Invalid input(s): PROCALCITONIN,  WBC,  LACTICIDVEN Microbiology Recent Results (from the past 240 hour(s))  SARS CORONAVIRUS 2 (TAT 6-24 HRS) Nasopharyngeal Nasopharyngeal Swab     Status: None   Collection Time: 04/20/21  6:58 AM   Specimen: Nasopharyngeal Swab  Result Value Ref Range Status   SARS Coronavirus 2 NEGATIVE NEGATIVE Final    Comment: (NOTE) SARS-CoV-2 target nucleic acids are NOT DETECTED.  The SARS-CoV-2 RNA is generally detectable in upper and lower respiratory specimens during the acute phase of infection. Negative results do not preclude SARS-CoV-2 infection, do not rule out co-infections with other pathogens, and should not be used as the sole basis for treatment or other patient management decisions. Negative results must be combined with clinical observations, patient history, and epidemiological information. The expected result is Negative.  Fact Sheet for Patients: SugarRoll.be  Fact Sheet for Healthcare Providers: https://www.woods-mathews.com/  This test is not yet approved or cleared by the [place] FDA and  has been authorized for detection and/or diagnosis of SARS-CoV-2 by FDA under an Emergency Use Authorization (EUA). This EUA will remain  in effect (meaning this test can be used) for the duration of the COVID-19 declaration under Se ction 564(b)(1) of the Act, 21 U.S.C. section 360bbb-3(b)(1), unless the authorization is terminated or revoked sooner.  Performed at [facility], [place] [address] [place], [place] [address]   MRSA Next Gen by PCR, Nasal     Status: None   Collection Time: 04/23/21   6:57 AM   Specimen: Nasal Mucosa; Nasal Swab  Result Value Ref Range Status   MRSA by PCR Next Gen NOT DETECTED NOT DETECTED Final    Comment: (NOTE) The GeneXpert MRSA Assay (FDA approved for NASAL specimens only), is one component of a comprehensive MRSA colonization surveillance program. It is not intended to diagnose MRSA infection nor to guide or monitor treatment for MRSA infections. Test performance is not FDA approved in patients less than 77 years old. Performed at [facility], [place] [address], [place], [place] [address]   Resp Panel by RT-PCR (Flu A&B, Covid) Nasopharyngeal Swab     Status: None   Collection Time: 04/26/21  2:09 PM   Specimen: Nasopharyngeal Swab; Nasopharyngeal(NP) swabs in vial transport medium  Result Value Ref Range Status   SARS Coronavirus 2 by RT PCR NEGATIVE NEGATIVE Final    Comment: (NOTE) SARS-CoV-2 target nucleic acids are NOT DETECTED.  The SARS-CoV-2 RNA is generally detectable in upper respiratory specimens during the acute phase of infection. The lowest concentration of SARS-CoV-2 viral copies this assay can detect is 138 copies/mL. A negative result does not preclude SARS-Cov-2 infection and should not be used as the sole basis for treatment or other patient management decisions. A negative result may occur with  improper specimen collection/handling, submission of specimen other than nasopharyngeal swab, presence of viral mutation(s) within the areas targeted by  this assay, and inadequate number of viral copies(<138 copies/mL). A negative result must be combined with clinical observations, patient history, and epidemiological information. The expected result is Negative.  Fact Sheet for Patients:  EntrepreneurPulse.com.au  Fact Sheet for Healthcare Providers:  IncredibleEmployment.be  This test is no t yet approved or cleared by the [place] FDA and  has been authorized for detection  and/or diagnosis of SARS-CoV-2 by FDA under an Emergency Use Authorization (EUA). This EUA will remain  in effect (meaning this test can be used) for the duration of the COVID-19 declaration under Section 564(b)(1) of the Act, 21 U.S.C.section 360bbb-3(b)(1), unless the authorization is terminated  or revoked sooner.       Influenza A by PCR NEGATIVE NEGATIVE Final   Influenza B by PCR NEGATIVE NEGATIVE Final    Comment: (NOTE) The Xpert Xpress SARS-CoV-2/FLU/RSV plus assay is intended as an aid in the diagnosis of influenza from Nasopharyngeal swab specimens and should not be used as a sole basis for treatment. Nasal washings and aspirates are unacceptable for Xpert Xpress SARS-CoV-2/FLU/RSV testing.  Fact Sheet for Patients: EntrepreneurPulse.com.au  Fact Sheet for Healthcare Providers: IncredibleEmployment.be  This test is not yet approved or cleared by the [place] FDA and has been authorized for detection and/or diagnosis of SARS-CoV-2 by FDA under an Emergency Use Authorization (EUA). This EUA will remain in effect (meaning this test can be used) for the duration of the COVID-19 declaration under Section 564(b)(1) of the Act, 21 U.S.C. section 360bbb-3(b)(1), unless the authorization is terminated or revoked.  Performed at [facility], [place] [address], [place], [place] [address]      Time coordinating discharge:  I have spent 35 minutes face to face with the patient and on the ward discussing the patients care, assessment, plan and disposition with other care givers. >50% of the time was devoted counseling the patient about the risks and benefits of treatment/Discharge disposition and coordinating care.   SIGNED:   Damita Lack, MD  [facility] 04/26/2021, 3:58 PM   If 7PM-7AM, please contact night-coverage

## 2021-04-26 NOTE — NC FL2 (Signed)
[place] LEVEL OF CARE SCREENING TOOL     IDENTIFICATION  Patient Name: Cindy Cervantes Birthdate: 04/23/1932 Sex: female Admission Date (Current Location): 04/20/2021  [facility] and [place] Number:  Herbalist and Address:  [facility] [place], [place] [address], [place], [place] [address]      Provider Number: [id number]  Attending Physician Name and Address:  Damita Lack, MD  Relative Name and Phone Number:       Current Level of Care: Hospital Recommended Level of Care: Assisted Living Facility Prior Approval Number:    Date Approved/Denied:   PASRR Number:    Discharge Plan: Other (Comment) (ALF)    Current Diagnoses: Patient Active Problem List   Diagnosis Date Noted   Atrial fibrillation with rapid ventricular response ([place])    Acute respiratory failure with hypoxia ([place]) 04/20/2021   Typical atrial flutter (HCC)    (HFpEF) heart failure with preserved ejection fraction ([place]) 09/22/2020   Palpitations 09/22/2020   Pressure injury of right buttock, unstageable ([place]) 04/10/2019   Chronic respiratory failure with hypoxia ([place]) 04/10/2019   Right lower quadrant abdominal mass 04/10/2019   Hypercoagulable state due to atrial fibrillation ([place]) 09/11/2017   Word finding difficulty 09/06/2017   Chronic fatigue 09/06/2017   History of shingles 09/06/2017   OSA treated with BiPAP 03/09/2016   Hyperglycemia 03/09/2016   Cerumen impaction 03/09/2016   Benign essential tremor 03/03/2014   Abdominal pain, other specified site 11/13/2013   Rosacea 05/15/2013   Hypothyroidism 02/08/2013   GERD (gastroesophageal reflux disease) 02/08/2013   Health care maintenance 02/08/2013   Constipation    Obesity    Anemia of renal disease 10/21/2011   Anemia, iron deficiency 10/21/2011   MDS (myelodysplastic syndrome), low grade ([place]) 10/21/2011   Pacemaker-St.Jude 01/18/2011   SLEEP RELATED HYPOVENTILATION/HYPOXEMIA CCE 12/14/2010    Tracheobronchomalacia 12/14/2010   Long term current use of anticoagulant 11/22/2010   Chronic diastolic heart failure ([place]) 11/15/2010   EDEMA 10/07/2010   SICK SINUS SYNDROME 10/01/2010   Atrial fibrillation ([place]) 09/21/2010   Hyperlipidemia 02/10/2010   Essential hypertension 02/10/2010   DYSPNEA 02/10/2010   Cough 02/10/2010    Orientation RESPIRATION BLADDER Height & Weight     Self, Time, Situation, Place  O2 (2L continuous nasal cannula) Continent Weight: 211 lb 10.3 oz (96 kg) Height:  '5\' 4"'$  (162.6 cm)  BEHAVIORAL SYMPTOMS/MOOD NEUROLOGICAL BOWEL NUTRITION STATUS      Continent Diet (Heart Healthy)  AMBULATORY STATUS COMMUNICATION OF NEEDS Skin   Limited Assist Verbally Normal                       Personal Care Assistance Level of Assistance  Bathing, Feeding, Dressing Bathing Assistance: Independent Feeding assistance: Independent Dressing Assistance: Independent     Functional Limitations Info  Hearing   Hearing Info: Impaired      SPECIAL CARE FACTORS FREQUENCY  PT (By licensed PT), OT (By licensed OT)     PT Frequency: home health PT 3x/week OT Frequency: home health OT 2x/week            Contractures Contractures Info: Not present    Additional Factors Info  Code Status, Allergies Code Status Info: DNR Allergies Info: Codeine, Iron, Morphine, Toviaz (Fesoterodine Fumarate)           Current Medications (04/26/2021):     Discharge Medications: TAKE these medications     acetaminophen 325 MG tablet Commonly known as:  TYLENOL Take 325-650 mg by mouth every 4 (four) hours as needed for moderate pain.    allopurinol 100 MG tablet Commonly known as: ZYLOPRIM Take 100 mg by mouth daily.    bisacodyl 10 MG suppository Commonly known as: DULCOLAX Place 10 mg rectally as needed for moderate constipation.    dextromethorphan-guaiFENesin 30-600 MG 12hr tablet Commonly known as: MUCINEX DM Take 1 tablet by mouth 2 (two) times daily as  needed (cough / congestion).    diltiazem 60 MG 12 hr capsule Commonly known as: CARDIZEM SR Take 1 capsule (60 mg total) by mouth every 12 (twelve) hours.    ezetimibe 10 MG tablet Commonly known as: ZETIA Take 10 mg by mouth every evening.    flecainide 50 MG tablet Commonly known as: TAMBOCOR Take 1 tablet (50 mg total) by mouth every 12 (twelve) hours.    ipratropium-albuterol 0.5-2.5 (3) MG/3ML Soln Commonly known as: DUONEB Take 3 mLs by nebulization every 6 (six) hours as needed (Shortness of Breath/Wheezing).    levothyroxine 75 MCG tablet Commonly known as: SYNTHROID Take 75 mcg by mouth daily before breakfast.    metoprolol succinate 25 MG 24 hr tablet Commonly known as: Toprol XL Take 1 tablet (25 mg total) by mouth at bedtime.    polyethylene glycol powder 17 GM/SCOOP powder Commonly known as: GLYCOLAX/MIRALAX Take 17 g by mouth daily as needed for mild constipation.    potassium chloride SA 20 MEQ tablet Commonly known as: KLOR-CON Take 40 mEq by mouth daily.    Rivaroxaban 15 MG Tabs tablet Commonly known as: Xarelto Take 1 tablet (15 mg total) by mouth daily with supper.    sertraline 50 MG tablet Commonly known as: ZOLOFT Take 1 tablet (50 mg total) by mouth daily.    torsemide 20 MG tablet Commonly known as: DEMADEX Take 20 mg by mouth daily.           ASK your doctor about these medications     fluticasone 50 MCG/ACT nasal spray Commonly known as: FLONASE Place 2 sprays into the nose 2 (two) times daily. 2 sprays in each nostril    Relevant Imaging Results:  Relevant Lab Results:   Additional Information SSN: [place],

## 2021-04-26 NOTE — Progress Notes (Signed)
Cindy Cervantes to be D/C'd  ALF  per MD order.  Discussed with the patient and all questions fully answered.  VSS, Skin clean, dry and intact without evidence of skin break down, no evidence of skin tears noted. IV catheter discontinued intact. Site without signs and symptoms of complications. Dressing and pressure applied.  An After Visit Summary was printed and given to the patient. Patient received prescription.  D/c education completed with patient/family including follow up instructions, medication list, d/c activities limitations if indicated, with other d/c instructions as indicated by MD - patient able to verbalize understanding, all questions fully answered.   Patient instructed to return to ED, call 911, or call MD for any changes in condition.   Patient escorted via [place], and D/C home via private auto.  Cindy Cervantes 04/26/2021 5:27 PM

## 2021-04-26 NOTE — TOC Transition Note (Signed)
Transition of Care [facility]) - CM/SW Discharge Note   Patient Details  Name: Cindy Cervantes MRN: [id number] Date of Birth: 03-25-1932  [facility] of [facility] CM/SW Contact:  Benard Halsted, LCSW Phone Number: 04/26/2021, 3:47 PM   Clinical Narrative:    Patient will DC to: [facility] Anticipated DC date: 04/26/21 Family notified: Daughter  Transport by: car   Per MD patient ready for DC to [facility]. RN to call report prior to discharge [phone]). RN, patient, patient's family, and facility notified of DC. Discharge Summary and FL2 sent to facility.   CSW will sign off for now as social work intervention is no longer needed. Please consult [place] again if new needs arise.     Final next level of care: Assisted Living Barriers to Discharge: Barriers Resolved   Patient Goals and CMS Choice Patient states their goals for this hospitalization and ongoing recovery are:: Return to ALF CMS Medicare.gov Compare Post Acute Care list provided to:: Patient Choice offered to / list presented to : Patient  Discharge Placement   Existing PASRR number confirmed : 04/26/21          Patient chooses bed at: [facility] (ALF) Patient to be transferred to facility by: Car Name of family member notified: Magda Paganini Patient and family notified of of transfer: 04/26/21  Discharge Plan and Services In-house Referral: Clinical Social Work   Post Acute Care Choice: Home Health                               Social Determinants of Health (SDOH) Interventions     Readmission Risk Interventions No flowsheet data found.

## 2021-04-26 NOTE — Anesthesia Preprocedure Evaluation (Signed)
Anesthesia Evaluation  Patient identified by MRN, date of birth, ID band Patient awake    Reviewed: Allergy & Precautions, H&P , NPO status , Patient's Chart, lab work & pertinent test results  Airway Mallampati: II   Neck ROM: full    Dental   Pulmonary sleep apnea , former smoker,    breath sounds clear to auscultation       Cardiovascular hypertension, + dysrhythmias Atrial Fibrillation + pacemaker  Rhythm:regular Rate:Normal     Neuro/Psych PSYCHIATRIC DISORDERS Depression  Neuromuscular disease    GI/Hepatic GERD  ,  Endo/Other  Hypothyroidism   Renal/GU Renal InsufficiencyRenal disease     Musculoskeletal  (+) Arthritis ,   Abdominal   Peds  Hematology   Anesthesia Other Findings   Reproductive/Obstetrics                             Anesthesia Physical Anesthesia Plan  ASA: 3  Anesthesia Plan: General   Post-op Pain Management:    Induction: Intravenous  PONV Risk Score and Plan: 3 and Propofol infusion and Treatment may vary due to age or medical condition  Airway Management Planned: Mask  Additional Equipment:   Intra-op Plan:   Post-operative Plan:   Informed Consent: I have reviewed the patients History and Physical, chart, labs and discussed the procedure including the risks, benefits and alternatives for the proposed anesthesia with the patient or authorized representative who has indicated his/her understanding and acceptance.     Dental advisory given  Plan Discussed with: CRNA, Anesthesiologist and Surgeon  Anesthesia Plan Comments:         Anesthesia Quick Evaluation

## 2021-04-27 ENCOUNTER — Telehealth: Payer: Self-pay | Admitting: Pulmonary Disease

## 2021-04-27 ENCOUNTER — Telehealth: Payer: Self-pay | Admitting: *Deleted

## 2021-04-27 NOTE — Telephone Encounter (Signed)
Transition Care Management Follow-up Telephone Call Date of discharge and from where: 04/26/21 [facility] How have you been since you were released from the hospital? Better, still weak but breathing better Any questions or concerns? No  Items Reviewed: Did the pt receive and understand the discharge instructions provided? Yes  Medications obtained and verified? Yes  was hard because patient stated that she just got up.  Other? No  Any new allergies since your discharge? No  Dietary orders reviewed? Yes Do you have support at home? Yes living in AL at [facility] and Equipment/Supplies: Were home health services ordered? yes If so, what is the name of the agency? Through [facility]  Has the agency set up a time to come to the patient's home? Yes stated that people was just in room Were any new equipment or medical supplies ordered?  No What is the name of the medical supply agency?  Were you able to get the supplies/equipment? not applicable Do you have any questions related to the use of the equipment or supplies? No  Functional Questionnaire: (I = Independent and D = Dependent) ADLs: I with assistance  Bathing/Dressing- I with assistance  Meal Prep- D  Eating- I  Maintaining continence- I  Transferring/Ambulation- I with assistance  Managing Meds- D  Follow up appointments reviewed:  PCP Hospital f/u appt confirmed? Yes  Scheduled to see Thornton Dales on 05/03/21 @ 1:30. [facility] f/u appt confirmed? Yes Scheduled to see [place] next week Are transportation arrangements needed? No Daughter takes her If their condition worsens, is the pt aware to call PCP or go to the Emergency Dept.? Yes Was the patient provided with contact information for the PCP's office or ED? Yes Was to pt encouraged to call back with questions or concerns? Yes

## 2021-04-27 NOTE — Telephone Encounter (Signed)
I placed it upfront in the box where we leave samples and things for patients to pick up. It was in a white envelope with the patient and daughter name on it. It was placed up there yesterday.

## 2021-04-27 NOTE — Telephone Encounter (Signed)
Amy do you know what happened with the rx for the bipap mask rx?   The pts daughter came by to pick this up but it was not up front.  Thanks

## 2021-04-28 ENCOUNTER — Encounter (HOSPITAL_COMMUNITY): Payer: Self-pay | Admitting: Internal Medicine

## 2021-04-28 DIAGNOSIS — R0602 Shortness of breath: Secondary | ICD-10-CM | POA: Diagnosis not present

## 2021-04-28 DIAGNOSIS — J9811 Atelectasis: Secondary | ICD-10-CM | POA: Diagnosis not present

## 2021-04-28 DIAGNOSIS — R2681 Unsteadiness on feet: Secondary | ICD-10-CM | POA: Diagnosis not present

## 2021-04-28 DIAGNOSIS — R2689 Other abnormalities of gait and mobility: Secondary | ICD-10-CM | POA: Diagnosis not present

## 2021-04-28 DIAGNOSIS — R5383 Other fatigue: Secondary | ICD-10-CM | POA: Diagnosis not present

## 2021-04-28 DIAGNOSIS — I480 Paroxysmal atrial fibrillation: Secondary | ICD-10-CM | POA: Diagnosis not present

## 2021-04-29 DIAGNOSIS — J9811 Atelectasis: Secondary | ICD-10-CM | POA: Diagnosis not present

## 2021-04-29 DIAGNOSIS — I480 Paroxysmal atrial fibrillation: Secondary | ICD-10-CM | POA: Diagnosis not present

## 2021-04-29 DIAGNOSIS — R5383 Other fatigue: Secondary | ICD-10-CM | POA: Diagnosis not present

## 2021-04-29 DIAGNOSIS — R0602 Shortness of breath: Secondary | ICD-10-CM | POA: Diagnosis not present

## 2021-04-29 DIAGNOSIS — R2681 Unsteadiness on feet: Secondary | ICD-10-CM | POA: Diagnosis not present

## 2021-04-29 DIAGNOSIS — R2689 Other abnormalities of gait and mobility: Secondary | ICD-10-CM | POA: Diagnosis not present

## 2021-04-29 NOTE — Telephone Encounter (Signed)
ATC, left VM for patient daughter leslie who is on DPR that the order for the mask was upfront, the front could not find it as it has dropped to the bottom of the basket and informed daughter to drop by the office and pick it up when ready. Nothing further needed.

## 2021-05-03 ENCOUNTER — Encounter: Payer: Self-pay | Admitting: Adult Health

## 2021-05-03 ENCOUNTER — Ambulatory Visit: Payer: Medicare Other | Admitting: Adult Health

## 2021-05-03 ENCOUNTER — Other Ambulatory Visit: Payer: Self-pay

## 2021-05-03 VITALS — BP 132/90 | HR 92 | Temp 97.9°F | Ht 64.0 in | Wt 212.6 lb

## 2021-05-03 DIAGNOSIS — I5032 Chronic diastolic (congestive) heart failure: Secondary | ICD-10-CM

## 2021-05-03 DIAGNOSIS — R410 Disorientation, unspecified: Secondary | ICD-10-CM | POA: Diagnosis not present

## 2021-05-03 DIAGNOSIS — Z6835 Body mass index (BMI) 35.0-35.9, adult: Secondary | ICD-10-CM | POA: Diagnosis not present

## 2021-05-03 DIAGNOSIS — D638 Anemia in other chronic diseases classified elsewhere: Secondary | ICD-10-CM | POA: Diagnosis not present

## 2021-05-03 DIAGNOSIS — E039 Hypothyroidism, unspecified: Secondary | ICD-10-CM | POA: Diagnosis not present

## 2021-05-03 DIAGNOSIS — I495 Sick sinus syndrome: Secondary | ICD-10-CM | POA: Diagnosis not present

## 2021-05-03 DIAGNOSIS — R531 Weakness: Secondary | ICD-10-CM | POA: Diagnosis not present

## 2021-05-03 DIAGNOSIS — G250 Essential tremor: Secondary | ICD-10-CM | POA: Diagnosis not present

## 2021-05-03 DIAGNOSIS — R946 Abnormal results of thyroid function studies: Secondary | ICD-10-CM | POA: Diagnosis not present

## 2021-05-03 DIAGNOSIS — I4892 Unspecified atrial flutter: Secondary | ICD-10-CM | POA: Diagnosis not present

## 2021-05-03 DIAGNOSIS — R7989 Other specified abnormal findings of blood chemistry: Secondary | ICD-10-CM | POA: Diagnosis not present

## 2021-05-03 DIAGNOSIS — D508 Other iron deficiency anemias: Secondary | ICD-10-CM | POA: Diagnosis not present

## 2021-05-03 DIAGNOSIS — N1832 Chronic kidney disease, stage 3b: Secondary | ICD-10-CM | POA: Diagnosis not present

## 2021-05-03 DIAGNOSIS — E785 Hyperlipidemia, unspecified: Secondary | ICD-10-CM | POA: Diagnosis not present

## 2021-05-03 LAB — LIPID PANEL
Cholesterol: 155 (ref 0–200)
HDL: 48 (ref 35–70)
LDL Cholesterol: 98
LDl/HDL Ratio: 3.2
Triglycerides: 77 (ref 40–160)

## 2021-05-03 LAB — CBC AND DIFFERENTIAL
HCT: 32 — AB (ref 36–46)
Hemoglobin: 10.1 — AB (ref 12.0–16.0)
Platelets: 159 (ref 150–399)
WBC: 7.7

## 2021-05-03 LAB — BASIC METABOLIC PANEL
BUN: 17 (ref 4–21)
CO2: 26 — AB (ref 13–22)
Chloride: 100 (ref 99–108)
Creatinine: 1.6 — AB (ref 0.5–1.1)
Glucose: 161
Potassium: 4.9 (ref 3.4–5.3)
Sodium: 147 (ref 137–147)

## 2021-05-03 LAB — CBC: RBC: 3.25 — AB (ref 3.87–5.11)

## 2021-05-03 LAB — COMPREHENSIVE METABOLIC PANEL
Albumin: 3.6 (ref 3.5–5.0)
Calcium: 8.9 (ref 8.7–10.7)
Globulin: 2.2

## 2021-05-03 LAB — TSH: TSH: 4.77 (ref 0.41–5.90)

## 2021-05-03 LAB — HEPATIC FUNCTION PANEL
ALT: 14 (ref 7–35)
AST: 13 (ref 13–35)
Alkaline Phosphatase: 103 (ref 25–125)
Bilirubin, Total: 0.4

## 2021-05-03 NOTE — Addendum Note (Signed)
Addended by: Barnie Mort on: 05/03/2021 02:27 PM   Modules accepted: Level of Service

## 2021-05-03 NOTE — Patient Instructions (Signed)
Monitor weights daily  Continue low sodium diet and avoid packaged food  Let us know if your tremor worsens or does not improve

## 2021-05-03 NOTE — Progress Notes (Signed)
Location:  [facility]   POS: clinic  Provider:  Cindi Carbon, [place] [phone]   Code Status: DNR Goals of Care:  Advanced Directives 05/03/2021  Does Patient Have a Medical Advance Directive? Yes  Type of Paramedic of [place];Living will  Does patient want to make changes to medical advance directive? No - Patient declined  Copy of [place] in Chart? -  Pre-existing out of facility DNR order (yellow form or pink MOST form) -     Chief Complaint  Patient presents with   Transitions Of Care    HPI: Patient is a 87 y.o. female seen today for transition of care visit s/p hospitalization 04/20/21-04/26/21 due to chest pain and shortness of breath and was found to be in a flutter. PMH significant for SSS s/p pacemaker, CHF, OSA, afib, obesity, hypothyroid, CRF on oxygen, CKD, ACD, and HLD.  She was initially placed on Cardizem infusion, status post cardioversion on 7/20, a flutter recurred 7/21.  Plans for repeat cardioversion 7/25 which was successful now in A paced rhythm. She continues on Flecainide 50 mg bid, toprol, Cardizem, and xarelto.   She feels tired more and did feel some palpitations earlier but this subsided.   On torsemide 20 mg daily, has gained 10 lbs in the past month. She report some dyspnea on exertion going to the BR but this is her baseline. She sleeps with her bed elevated at 30 degrees but this has not changed   Weighs 202 on 7/1,  208 7/31, 212 8/1  Eating high sodium foods and snacks  Reports her essential tremor is getting worse, comes on at rest and with intention. No rigidity. Working with PT after her hospitalization  She also feels more forgetful and had a feeling of disorientation since she returned from the hospital. Denies any dysuria, bladder pain, or fever.    Past Medical History:  Diagnosis Date   Allergic rhinitis    Anemia of renal disease 10/21/2011   Anemia, iron  deficiency 10/21/2011   Cholelithiasis    Chronic diastolic heart failure (HCC)    Colon polyp    Constipation    Cough    2nd to reflux   Debility, unspecified    Dehydration    Depression    Diaphragm dysfunction    Right hemidiaphragm   Disorder of bone and cartilage, unspecified    Diverticulosis of colon    Dysphagia    GERD (gastroesophageal reflux disease) 02/08/2013   History of hyperkalemia in setting of spironolactone 09/2010   Hyperlipidemia    Hypertension    Hypopotassemia    Hypothyroidism 02/08/2013   Long term (current) use of anticoagulants    MDS (myelodysplastic syndrome), low grade ([place]) 10/21/2011   Obesity    Obesity hypoventilation syndrome (HCC)    OSA (obstructive sleep apnea)    Osteoarthrosis, unspecified whether generalized or localized, unspecified site    Other specified disease of white blood cells    Pacemaker    Implanted December 2012   Paroxysmal A-fib [facility]) with RVR   Amiodarone   Reflux esophagitis    Rosacea 05/15/2013   Senile cataract    Sinus node dysfunction (HCC)    Tracheobronchomalacia    Unspecified vitamin D deficiency    Urinary incontinence    Xerostomia     Past Surgical History:  Procedure Laterality Date   CARDIOVERSION N/A 12/17/2019   Procedure: CARDIOVERSION;  Surgeon: Skeet Latch, MD;  Location:  [facility];  Service: Cardiovascular;  Laterality: N/A;   CARDIOVERSION N/A 04/21/2021   Procedure: CARDIOVERSION;  Surgeon: Jerline Pain, MD;  Location: [facility];  Service: Cardiovascular;  Laterality: N/A;   CARDIOVERSION N/A 04/26/2021   Procedure: CARDIOVERSION;  Surgeon: Pixie Casino, MD;  Location: [facility];  Service: Cardiovascular;  Laterality: N/A;   CATARACT EXTRACTION  12/24/2006   PACEMAKER PLACEMENT  10/21/2010   STJ, dural chamber Dr. [name]   TOTAL KNEE ARTHROPLASTY  1999   right knee   TOTAL KNEE ARTHROPLASTY  2001-12-23   left knee   TUBAL LIGATION  12/24/1962   Tummy tuck  24-Dec-1998   pt "almost died";  respiratory distress, became delrious    Allergies  Allergen Reactions   Codeine     Extreme lethargy   Iron     By infusion - back spasms    Morphine     Hallucinations, altered mental state   Toviaz [Fesoterodine Fumarate]     hyperactive    Outpatient Encounter Medications as of 05/03/2021  Medication Sig   acetaminophen (TYLENOL) 325 MG tablet Take 325-650 mg by mouth every 4 (four) hours as needed for moderate pain.   allopurinol (ZYLOPRIM) 100 MG tablet Take 100 mg by mouth daily.   bisacodyl (DULCOLAX) 10 MG suppository Place 10 mg rectally as needed for moderate constipation.   dextromethorphan-guaiFENesin (MUCINEX DM) 30-600 MG 12hr tablet Take 1 tablet by mouth 2 (two) times daily as needed (cough / congestion).   diltiazem (CARDIZEM SR) 60 MG 12 hr capsule Take 1 capsule (60 mg total) by mouth 2 (two) times daily.   ezetimibe (ZETIA) 10 MG tablet Take 10 mg by mouth every evening.    flecainide (TAMBOCOR) 50 MG tablet Take 1 tablet (50 mg total) by mouth every 12 (twelve) hours.   fluticasone (FLONASE) 50 MCG/ACT nasal spray Place 2 sprays into the nose 2 (two) times daily. 2 sprays in each nostril (Patient taking differently: Place 2 sprays into the nose 2 (two) times daily.)   ipratropium-albuterol (DUONEB) 0.5-2.5 (3) MG/3ML SOLN Take 3 mLs by nebulization every 6 (six) hours as needed (Shortness of Breath/Wheezing).   levothyroxine (SYNTHROID, LEVOTHROID) 75 MCG tablet Take 75 mcg by mouth daily before breakfast.    metoprolol succinate (TOPROL XL) 25 MG 24 hr tablet Take 1 tablet (25 mg total) by mouth at bedtime.   polyethylene glycol powder (GLYCOLAX/MIRALAX) 17 GM/SCOOP powder Take 17 g by mouth daily as needed for mild constipation.   potassium chloride SA (K-DUR,KLOR-CON) 20 MEQ tablet Take 40 mEq by mouth daily.   Rivaroxaban (XARELTO) 15 MG TABS tablet Take 1 tablet (15 mg total) by mouth daily with supper.   sertraline (ZOLOFT) 50 MG tablet Take 1 tablet (50 mg  total) by mouth daily.   torsemide (DEMADEX) 20 MG tablet Take 20 mg by mouth daily.   No facility-administered encounter medications on file as of 05/03/2021.    Review of Systems:  Review of Systems  Constitutional:  Positive for activity change and fatigue. Negative for appetite change, chills, diaphoresis, fever and unexpected weight change.  HENT:  Negative for congestion.   Respiratory:  Negative for cough, shortness of breath and wheezing.   Cardiovascular:  Positive for palpitations and leg swelling. Negative for chest pain.  Gastrointestinal:  Negative for abdominal distention, abdominal pain, constipation and diarrhea.  Genitourinary:  Negative for difficulty urinating and dysuria.  Musculoskeletal:  Positive for gait problem. Negative for arthralgias, back pain, joint swelling  and myalgias.  Neurological:  Negative for dizziness, tremors, seizures, syncope, facial asymmetry, speech difficulty, weakness, light-headedness, numbness and headaches.  Psychiatric/Behavioral:  Positive for confusion. Negative for agitation and behavioral problems.    Health Maintenance  Topic Date Due   COVID-19 Vaccine (4 - Booster) 11/17/2020   INFLUENZA VACCINE  05/03/2021   TETANUS/TDAP  07/07/2025   DEXA SCAN  Completed   PNA vac Low Risk Adult  Completed   Zoster Vaccines- Shingrix  Completed   HPV VACCINES  Aged Out    Physical Exam: Vitals:   05/03/21 1328  BP: 132/90  Pulse: 92  Temp: 97.9 F (36.6 C)  SpO2: 92%  Weight: 212 lb 9.6 oz (96.4 kg)  Height: '5\' 4"'$  (1.626 m)   Body mass index is 36.49 kg/m. Physical Exam Vitals and nursing note reviewed.  Constitutional:      General: She is not in acute distress.    Appearance: She is not diaphoretic.  HENT:     Head: Normocephalic and atraumatic.     Mouth/Throat:     Mouth: Mucous membranes are moist.     Pharynx: Oropharynx is clear. No oropharyngeal exudate.  Eyes:     Conjunctiva/sclera: Conjunctivae normal.      Pupils: Pupils are equal, round, and reactive to light.  Neck:     Vascular: No JVD.  Cardiovascular:     Rate and Rhythm: Normal rate.     Heart sounds: No murmur heard.    Comments: Rate 92. Regular with skipped beats.  Pulmonary:     Effort: Pulmonary effort is normal. No respiratory distress.     Breath sounds: Wheezing present.  Abdominal:     General: Bowel sounds are normal. There is no distension.     Palpations: Abdomen is soft.     Tenderness: There is no abdominal tenderness.  Musculoskeletal:     Cervical back: No rigidity or tenderness.     Right lower leg: Edema (+1) present.     Left lower leg: Edema (+1) present.  Lymphadenopathy:     Cervical: No cervical adenopathy.  Skin:    General: Skin is warm and dry.  Neurological:     Mental Status: She is alert and oriented to person, place, and time.  Psychiatric:        Mood and Affect: Mood normal.    Labs reviewed: Basic Metabolic Panel: Recent Labs    04/20/21 0840 04/20/21 1011 04/20/21 1915 04/21/21 0144 04/24/21 0055 04/25/21 0453 04/26/21 0210  NA  --    < >  --    < > 140 139 141  K  --    < >  --    < > 3.7 3.4* 3.6  CL  --   --   --    < > 100 97* 97*  CO2  --   --   --    < > 34* 33* 38*  GLUCOSE  --   --   --    < > 116* 103* 101*  BUN  --   --   --    < > '17 14 14  '$ CREATININE  --   --   --    < > 1.48* 1.39* 1.51*  CALCIUM  --   --   --    < > 8.4* 8.5* 8.6*  MG 2.2  --   --   --   --  2.0 2.0  TSH  --   --  2.546  --   --   --   --    < > = values in this interval not displayed.   Liver Function Tests: Recent Labs    10/05/20 0000 04/20/21 0135  AST  --  23  ALT  --  10  ALKPHOS  --  80  BILITOT  --  1.0  PROT  --  6.1*  ALBUMIN 3.4* 3.0*   No results for input(s): LIPASE, AMYLASE in the last 8760 hours. No results for input(s): AMMONIA in the last 8760 hours. CBC: Recent Labs    04/20/21 0135 04/20/21 1011 04/24/21 0055 04/25/21 0453 04/26/21 0210  WBC 9.6   < > 8.2  7.6 8.0  NEUTROABS 7.4  --   --   --   --   HGB 10.5*   < > 10.3* 10.3* 10.8*  HCT 35.5*   < > 34.4* 33.1* 35.7*  MCV 105.7*   < > 104.2* 102.2* 104.1*  PLT 188   < > 155 154 176   < > = values in this interval not displayed.   Lipid Panel: Recent Labs    10/05/20 0000  CHOL 168  HDL 46  LDLCALC 105  TRIG 86   Lab Results  Component Value Date   HGBA1C 5.5 09/06/2016    Procedures since last visit: CT Angio Chest PE W and/or Wo Contrast  Result Date: 04/20/2021 CLINICAL DATA:  High probability for pulmonary embolism. EXAM: CT ANGIOGRAPHY CHEST WITH CONTRAST TECHNIQUE: Multidetector CT imaging of the chest was performed using the standard protocol during bolus administration of intravenous contrast. Multiplanar CT image reconstructions and MIPs were obtained to evaluate the vascular anatomy. CONTRAST:  83m OMNIPAQUE IOHEXOL 350 MG/ML SOLN COMPARISON:  10/27/2010 FINDINGS: Cardiovascular: Satisfactory opacification of the pulmonary arteries to the segmental level. No evidence of pulmonary embolism. Enlarged heart. Dual-chamber pacer leads in place. Atheromatous calcification of the aorta. Mediastinum/Nodes: Negative for hematoma or visible inflammation Lungs/Pleura: Very elevated right diaphragm and generally low volume chest. The bronchus intermedius is narrowed with partial opacification. Mild atelectatic type densities at the left base. There is a band of atelectasis at the right base. No edema or consolidation Upper Abdomen: No acute finding. Peripheral gallbladder calcification similar to prior. Given the occasional crescentic morphology this is at least partially related to calculi ; there could also be some mural calcification. Musculoskeletal: No acute finding Review of the MIP images confirms the above findings. IMPRESSION: 1. Negative for pulmonary embolism. 2. Low volume chest with chronic marked elevation of the right diaphragm and atelectasis. Bronchus intermedius  stenosis/collapse, likely chronic. 3. Cholelithiasis. There may also be gallbladder wall calcification. Electronically Signed   By: JMonte FantasiaM.D.   On: 04/20/2021 04:34   NM Myocar Multi W/Spect W/Wall Motion / EF  Result Date: 04/23/2021  There was no ST segment deviation noted during stress.  This is a low risk study.  Nuclear stress EF: 61%.  The left ventricular ejection fraction is normal (55-65%).  Normal perfusion. LVEF 61% with normal wall motion. This is a low risk study.   DG CHEST PORT 1 VIEW  Result Date: 04/21/2021 CLINICAL DATA:  Cough and congestion following cardioversion EXAM: PORTABLE CHEST 1 VIEW COMPARISON:  04/20/2021 FINDINGS: Cardiac shadow is stable. Pacemaker is again seen and stable. Aortic calcifications are noted. Elevation of the right hemidiaphragm is noted. Mild bibasilar atelectasis is seen. No acute bony abnormality is noted. IMPRESSION: Mild bibasilar atelectasis.  No other focal abnormality is  seen. Electronically Signed   By: Inez Catalina M.D.   On: 04/21/2021 16:42   DG Chest Portable 1 View  Result Date: 04/20/2021 CLINICAL DATA:  Left chest pain, AFib with RVR EXAM: PORTABLE CHEST 1 VIEW COMPARISON:  09/05/2012 FINDINGS: Low lung volumes. No focal consolidation. No pleural effusion or pneumothorax. Chronic eventration of right hemidiaphragm. The heart is normal in size. Thoracic aortic atherosclerosis. Left subclavian pacemaker. IMPRESSION: Low lung volumes with chronic eventration of the right hemidiaphragm. No evidence of acute cardiopulmonary disease. Electronically Signed   By: Julian Hy M.D.   On: 04/20/2021 01:50   ECHOCARDIOGRAM COMPLETE  Result Date: 04/20/2021    ECHOCARDIOGRAM REPORT   Patient Name:   Cindy Cervantes Date of Exam: 04/20/2021 Medical Rec #:  [id number]       Height:       64.0 in Accession #:    [id number]      Weight:       206.6 lb Date of Birth:  [birth year]       BSA:          1.983 m Patient Age:    33 years         BP:           128/72 mmHg Patient Gender: F               HR:           107 bpm. Exam Location:  Inpatient Procedure: 2D Echo, Color Doppler and Cardiac Doppler Indications:    I48.91* Unspecified atrial fibrillation  History:        Patient has prior history of Echocardiogram examinations, most                 recent 12/06/2017. CHF, Pacemaker, Arrythmias:Atrial Fibrillation;                 Risk Factors:Hypertension, Dyslipidemia and Sleep Apnea.  Sonographer:    Raquel Sarna Senior RDCS Referring Phys: [id number] [place]  1. Left ventricular ejection fraction, by estimation, is 60 to 65%. The left ventricle has normal function. The left ventricle has no regional wall motion abnormalities. Left ventricular diastolic function could not be evaluated.  2. Right ventricular systolic function is normal. The right ventricular size is normal. There is normal pulmonary artery systolic pressure.  3. The mitral valve is normal in structure. Trivial mitral valve regurgitation.  4. The aortic valve is grossly normal. There is moderate calcification of the aortic valve. There is moderate thickening of the aortic valve. Aortic valve regurgitation is not visualized. Mild to moderate aortic valve sclerosis/calcification is present,  without any evidence of aortic stenosis.  5. The inferior vena cava is normal in size with <50% respiratory variability, suggesting right atrial pressure of 8 mmHg. FINDINGS  Left Ventricle: Left ventricular ejection fraction, by estimation, is 60 to 65%. The left ventricle has normal function. The left ventricle has no regional wall motion abnormalities. The left ventricular internal cavity size was normal in size. There is  no left ventricular hypertrophy. Left ventricular diastolic function could not be evaluated due to atrial fibrillation. Left ventricular diastolic function could not be evaluated. Right Ventricle: The right ventricular size is normal. No increase in right ventricular wall  thickness. Right ventricular systolic function is normal. There is normal pulmonary artery systolic pressure. The tricuspid regurgitant velocity is 2.05 m/s, and  with an assumed right atrial pressure of 8 mmHg, the estimated right ventricular systolic pressure  is 24.8 mmHg. Left Atrium: Left atrial size was normal in size. Right Atrium: Right atrial size was normal in size. Pericardium: There is no evidence of pericardial effusion. Mitral Valve: The mitral valve is normal in structure. Trivial mitral valve regurgitation. Tricuspid Valve: The tricuspid valve is normal in structure. Tricuspid valve regurgitation is mild. Aortic Valve: The aortic valve is grossly normal. There is moderate calcification of the aortic valve. There is moderate thickening of the aortic valve. Aortic valve regurgitation is not visualized. Mild to moderate aortic valve sclerosis/calcification is present, without any evidence of aortic stenosis. Pulmonic Valve: The pulmonic valve was not well visualized. Pulmonic valve regurgitation is not visualized. No evidence of pulmonic stenosis. Aorta: The aortic root and ascending aorta are structurally normal, with no evidence of dilitation. Venous: The inferior vena cava is normal in size with less than 50% respiratory variability, suggesting right atrial pressure of 8 mmHg. IAS/Shunts: No atrial level shunt detected by color flow Doppler. Additional Comments: A device lead is visualized in the right ventricle.  LEFT VENTRICLE PLAX 2D LVIDd:         4.90 cm  Diastology LVIDs:         2.90 cm  LV e' medial:    6.64 cm/s LV PW:         1.10 cm  LV E/e' medial:  19.3 LV IVS:        0.70 cm  LV e' lateral:   8.59 cm/s LVOT diam:     1.90 cm  LV E/e' lateral: 14.9 LV SV:         37 LV SV Index:   19 LVOT Area:     2.84 cm  RIGHT VENTRICLE RV S prime:     6.96 cm/s TAPSE (M-mode): 1.8 cm LEFT ATRIUM             Index       RIGHT ATRIUM           Index LA diam:        3.70 cm 1.87 cm/m  RA Area:      14.30 cm LA Vol (A2C):   48.9 ml 24.65 ml/m RA Volume:   30.20 ml  15.23 ml/m LA Vol (A4C):   46.7 ml 23.55 ml/m LA Biplane Vol: 49.0 ml 24.70 ml/m  AORTIC VALVE LVOT Vmax:   65.80 cm/s LVOT Vmean:  46.500 cm/s LVOT VTI:    0.130 m  AORTA Ao Root diam: 2.90 cm Ao Asc diam:  3.50 cm MITRAL VALVE                TRICUSPID VALVE MV Area (PHT): 3.60 cm     TR Peak grad:   16.8 mmHg MV Decel Time: 211 msec     TR Vmax:        205.00 cm/s MV E velocity: 128.00 cm/s MV A velocity: 41.40 cm/s   SHUNTS MV E/A ratio:  3.09         Systemic VTI:  0.13 m                             Systemic Diam: 1.90 cm Dani Gobble Croitoru MD Electronically signed by Sanda Klein MD Signature Date/Time: 04/20/2021/4:24:52 PM    Final    CUP PACEART REMOTE DEVICE CHECK  Result Date: 04/20/2021 Scheduled monthly (MOD) remote reviewed. Normal device function.  Battery longevity <3 months. Battery voltage 2.63V AF burden 17%, AF on  presenting rhythm with onset on 03/28/21. Rates controlled. [place]- Xarelto Routing for further review and management Next remote 91 days- JBox, RN/CVRS   Assessment/Plan  1. Atrial flutter, unspecified type ([place]) Rate is 92 with skipped beat on exam Currently on flecainide, metoprolol, cardizem, and xarelto Will f/u with cardiology next week   2. Chronic diastolic heart failure ([place]) Has gained 10 lbs over the past month No change in work of breathing Will increase torsemide to 20 mg bid x 3 days and f/uc with cardiology Continue daily weights Continue low sodium diet   3. Acquired hypothyroidism Lab Results  Component Value Date   TSH 2.546 04/20/2021     4. Stage 3b chronic kidney disease [facility]) Lab Results  Component Value Date   BUN 14 04/26/2021   Lab Results  Component Value Date   CREATININE 1.51 (H) 04/26/2021    Continue to periodically monitor BMP and avoid nephrotoxic agents  5. SICK SINUS SYNDROME Has pacemaker, will need battery change soon  6. Anemia of chronic  disease Lab Results  Component Value Date   HGB 10.8 (L) 04/26/2021  Stable CBC today is pending   7. Class 2 severe obesity due to excess calories with serious comorbidity and body mass index (BMI) of 35.0 to 35.9 in adult [facility]) Discussed weight, diet Working with therapy  8. Acute delirium Mild after hospitalization and cardioversion Will give her more time to improve Educated on monitoring for improvement over the next month and reporting if not better No current s/s of infection   9. Benign essential tremor Slightly worse since her hospitalization and recent med changes BMP and TSH ordered and are pending. Continues to work with PT and will f/u if worsening or no improvement   Total time 40 min:  time greater than 50% of total time spent doing pt counseling and coordination of care     Labs/tests ordered:  CBC prior to apt Next appt:  05/12/2021 with Dr Lyndel Safe

## 2021-05-04 DIAGNOSIS — R2689 Other abnormalities of gait and mobility: Secondary | ICD-10-CM | POA: Diagnosis not present

## 2021-05-04 DIAGNOSIS — I480 Paroxysmal atrial fibrillation: Secondary | ICD-10-CM | POA: Diagnosis not present

## 2021-05-04 DIAGNOSIS — R5383 Other fatigue: Secondary | ICD-10-CM | POA: Diagnosis not present

## 2021-05-04 DIAGNOSIS — R0602 Shortness of breath: Secondary | ICD-10-CM | POA: Diagnosis not present

## 2021-05-04 DIAGNOSIS — J9811 Atelectasis: Secondary | ICD-10-CM | POA: Diagnosis not present

## 2021-05-04 DIAGNOSIS — R2681 Unsteadiness on feet: Secondary | ICD-10-CM | POA: Diagnosis not present

## 2021-05-05 DIAGNOSIS — R0602 Shortness of breath: Secondary | ICD-10-CM | POA: Diagnosis not present

## 2021-05-05 DIAGNOSIS — I480 Paroxysmal atrial fibrillation: Secondary | ICD-10-CM | POA: Diagnosis not present

## 2021-05-05 DIAGNOSIS — R2689 Other abnormalities of gait and mobility: Secondary | ICD-10-CM | POA: Diagnosis not present

## 2021-05-05 DIAGNOSIS — R2681 Unsteadiness on feet: Secondary | ICD-10-CM | POA: Diagnosis not present

## 2021-05-05 DIAGNOSIS — J9811 Atelectasis: Secondary | ICD-10-CM | POA: Diagnosis not present

## 2021-05-05 DIAGNOSIS — R5383 Other fatigue: Secondary | ICD-10-CM | POA: Diagnosis not present

## 2021-05-06 NOTE — H&P (View-Only) (Signed)
Electrophysiology Office Note Date: 05/10/2021  ID:  Cindy Cervantes 23-Oct-1931, MRN [id number]  PCP: Virgie Dad, MD Primary Cardiologist: None Electrophysiologist: Virl Axe, MD   CC: Pacemaker follow-up  Cindy Cervantes is a 87 y.o. female seen today for Virl Axe, MD for post hospital follow up.    Pt admitted 7/19 - 04/26/21 with CP and SOB. Noted to be in atrial flutter requiring cardizem infusion and DCC. Failed cardioversion 7/20 with ERAF 7/21. Repeat [facility] 7/25 to an a paced rhythm and discharged home with close follow up. Started on flecainide after discussion with Dr. [name] per hospital notes.  Since last being seen in our clinic the patient reports doing about the same. She continues to have intermittent peripheral edema requiring adjustment of her torsemide at her facility. Doing OK today. Remains tired and SOB with mild to mod exertion. Working with PT.   Device History: St. Jude Dual Chamber PPM implanted 10/2010 for SSS  Past Medical History:  Diagnosis Date   Allergic rhinitis    Anemia of renal disease 10/21/2011   Anemia, iron deficiency 10/21/2011   Cholelithiasis    Chronic diastolic heart failure (HCC)    Colon polyp    Constipation    Cough    2nd to reflux   Debility, unspecified    Dehydration    Depression    Diaphragm dysfunction    Right hemidiaphragm   Disorder of bone and cartilage, unspecified    Diverticulosis of colon    Dysphagia    GERD (gastroesophageal reflux disease) 02/08/2013   History of hyperkalemia in setting of spironolactone 09/2010   Hyperlipidemia    Hypertension    Hypopotassemia    Hypothyroidism 02/08/2013   Long term (current) use of anticoagulants    MDS (myelodysplastic syndrome), low grade ([place]) 10/21/2011   Obesity    Obesity hypoventilation syndrome (HCC)    OSA (obstructive sleep apnea)    Osteoarthrosis, unspecified whether generalized or localized, unspecified site    Other specified disease of white  blood cells    Pacemaker    Implanted December 2012   Paroxysmal A-fib [facility]) with RVR   Amiodarone   Reflux esophagitis    Rosacea 05/15/2013   Senile cataract    Sinus node dysfunction ([place])    Tracheobronchomalacia    Unspecified vitamin D deficiency    Urinary incontinence    Xerostomia    Past Surgical History:  Procedure Laterality Date   CARDIOVERSION N/A 12/17/2019   Procedure: CARDIOVERSION;  Surgeon: Skeet Latch, MD;  Location: [place];  Service: Cardiovascular;  Laterality: N/A;   CARDIOVERSION N/A 04/21/2021   Procedure: CARDIOVERSION;  Surgeon: Jerline Pain, MD;  Location: [place];  Service: Cardiovascular;  Laterality: N/A;   CARDIOVERSION N/A 04/26/2021   Procedure: CARDIOVERSION;  Surgeon: Pixie Casino, MD;  Location: [facility];  Service: Cardiovascular;  Laterality: N/A;   CATARACT EXTRACTION  December 21, 2006   PACEMAKER PLACEMENT  10/21/2010   STJ, dural chamber Dr. [name]   TOTAL KNEE ARTHROPLASTY  1999   right knee   TOTAL KNEE ARTHROPLASTY  Dec 20, 2001   left knee   TUBAL LIGATION  12/21/62   Tummy tuck  12-21-1998   pt "almost died"; respiratory distress, became delrious    Current Outpatient Medications  Medication Sig Dispense Refill   acetaminophen (TYLENOL) 325 MG tablet Take 325-650 mg by mouth every 4 (four) hours as needed for moderate pain.     allopurinol (ZYLOPRIM) 100 MG  tablet Take 100 mg by mouth daily.     bisacodyl (DULCOLAX) 10 MG suppository Place 10 mg rectally as needed for moderate constipation.     dextromethorphan-guaiFENesin (MUCINEX DM) 30-600 MG 12hr tablet Take 1 tablet by mouth 2 (two) times daily as needed (cough / congestion).     diltiazem (CARDIZEM SR) 60 MG 12 hr capsule Take 1 capsule (60 mg total) by mouth 2 (two) times daily. 60 capsule 0   ezetimibe (ZETIA) 10 MG tablet Take 10 mg by mouth every evening.      flecainide (TAMBOCOR) 50 MG tablet Take 1 tablet (50 mg total) by mouth every 12 (twelve) hours. 60 tablet 0    fluticasone (FLONASE) 50 MCG/ACT nasal spray Place 2 sprays into the nose 2 (two) times daily. 2 sprays in each nostril (Patient taking differently: Place 2 sprays into the nose 2 (two) times daily.)     ipratropium-albuterol (DUONEB) 0.5-2.5 (3) MG/3ML SOLN Take 3 mLs by nebulization every 6 (six) hours as needed (Shortness of Breath/Wheezing).     levothyroxine (SYNTHROID, LEVOTHROID) 75 MCG tablet Take 75 mcg by mouth daily before breakfast.      metoprolol succinate (TOPROL XL) 25 MG 24 hr tablet Take 1 tablet (25 mg total) by mouth at bedtime. 30 tablet 6   polyethylene glycol powder (GLYCOLAX/MIRALAX) 17 GM/SCOOP powder Take 17 g by mouth daily as needed for mild constipation.     potassium chloride SA (K-DUR,KLOR-CON) 20 MEQ tablet Take 40 mEq by mouth daily.     Rivaroxaban (XARELTO) 15 MG TABS tablet Take 1 tablet (15 mg total) by mouth daily with supper. 30 tablet    sertraline (ZOLOFT) 50 MG tablet Take 1 tablet (50 mg total) by mouth daily. 30 tablet 3   torsemide (DEMADEX) 20 MG tablet Take 20 mg by mouth daily.     No current facility-administered medications for this visit.    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  none in one year   Drug use: No   Sexual activity: Never  Other Topics Concern   Not on file  Social History Narrative   Widowed 2002 (married  1955), originally form [place]. 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 Moved from [place], [place] to [place], [place] section 2009. Moved to [place]isted Living section 2012.    Non smoker, mininmal alcohol.    Has POA   Power wheelchair, walker   Exercise none               Social Determinants of Health   Financial Resource Strain: Not on file  Food Insecurity: Not on file  Transportation Needs: Not on file  Physical Activity: Not on file  Stress: Not  on file  Social Connections: Not on file  Intimate Partner Violence: Not on file    Family History: Family History  Problem Relation Age of Onset   Heart disease Father    Parkinson's disease Sister    Heart disease Brother    Parkinson's disease Brother      Review of Systems: All other systems reviewed and are otherwise negative except as noted above.  Physical Exam: Vitals:   05/10/21 0834  BP: 130/68  Pulse: 84  SpO2: 93%  Weight: 211 lb 6.4 oz (95.9 kg)  Height: '5\' 4"'$  (1.626 m)     GEN- The patient is well appearing, alert and oriented x 3 today.   HEENT: normocephalic, atraumatic; sclera clear, conjunctiva pink; hearing intact; oropharynx clear; neck supple  Lungs- Clear to ausculation bilaterally, normal work of breathing.  No wheezes, rales, rhonchi Heart- Regular rate and rhythm, no murmurs, rubs or gallops  GI- soft, non-tender, non-distended, bowel sounds present  Extremities- no clubbing or cyanosis. No edema MS- no significant deformity or atrophy Skin- warm and dry, no rash or lesion; PPM pocket well healed Psych- euthymic mood, full affect Neuro- strength and sensation are intact  PPM Interrogation- reviewed in detail today,  See PACEART report  EKG:  EKG is not ordered today. The ekg ordered 04/26/21 shows A paced at 60 bpm with QRS 150 ms  Recent Labs: 04/20/2021: ALT 10; B Natriuretic Peptide 183.9; TSH 2.546 04/26/2021: BUN  14; Creatinine, Ser 1.51; Hemoglobin 10.8; Magnesium 2.0; Platelets 176; Potassium 3.6; Sodium 141   Wt Readings from Last 3 Encounters:  05/10/21 211 lb 6.4 oz (95.9 kg)  05/03/21 212 lb 9.6 oz (96.4 kg)  04/26/21 211 lb 10.3 oz (96 kg)     Other studies Reviewed: Additional studies/ records that were reviewed today include: Previous EP office notes, Previous remote checks, Most recent labwork.   Echo 04/20/2021 LVEF 60-65%  Myoview 04/23/2021 LVEF 61%, no WMA  Assessment and Plan:  1. Sick sinus syndrome s/p St. Jude PPM  Normal PPM function See Pace Art report No changes today  2. Persistent atrial fibrillation/flutter Back in AF today, since 7/27 (2 days after cardioversion)  today Continue flecainide 50 mg BID. She appears to have had some QRS widening based on EKG 7/25. I will not increase and make close follow up post [facility] with Dr. [name] to make sure this is right medication for her.  Continue xarelto 15 mg daily for CHA2DS2/VASc of at least 5 Previously failed amio due to worsening tremors.  Labs today.    3. CKD III Cr 1.51  BMET today.      Current medicines are reviewed at length with the patient today.   The patient does not have concerns regarding her medicines.  The following changes were made today:  none  Labs/ tests ordered today include:  Orders Placed This Encounter  Procedures   Basic Metabolic Panel (BMET)   CBC   Magnesium   Disposition:   Follow up with Dr. [name] in 7-10 days s/p [facility].     Jacalyn Lefevre, PA-C  05/10/2021 9:08 AM  [facility] [address] [place] [address] [phone] (office) [phone] (fax)

## 2021-05-06 NOTE — Progress Notes (Signed)
Electrophysiology Office Note Date: 05/10/2021  ID:  Cindy Cervantes, Cindy Cervantes March 19, 1932, MRN [id number]  PCP: Virgie Dad, MD Primary Cardiologist: None Electrophysiologist: Virl Axe, MD   CC: Pacemaker follow-up  Cindy Cervantes is a 87 y.o. female seen today for Virl Axe, MD for post hospital follow up.    Pt admitted 7/19 - 04/26/21 with CP and SOB. Noted to be in atrial flutter requiring cardizem infusion and DCC. Failed cardioversion 7/20 with ERAF 7/21. Repeat [facility] 7/25 to an a paced rhythm and discharged home with close follow up. Started on flecainide after discussion with Dr. [name] per hospital notes.  Since last being seen in our clinic the patient reports doing about the same. She continues to have intermittent peripheral edema requiring adjustment of her torsemide at her facility. Doing OK today. Remains tired and SOB with mild to mod exertion. Working with PT.   Device History: St. Jude Dual Chamber PPM implanted 10/2010 for SSS  Past Medical History:  Diagnosis Date   Allergic rhinitis    Anemia of renal disease 10/21/2011   Anemia, iron deficiency 10/21/2011   Cholelithiasis    Chronic diastolic heart failure (HCC)    Colon polyp    Constipation    Cough    2nd to reflux   Debility, unspecified    Dehydration    Depression    Diaphragm dysfunction    Right hemidiaphragm   Disorder of bone and cartilage, unspecified    Diverticulosis of colon    Dysphagia    GERD (gastroesophageal reflux disease) 02/08/2013   History of hyperkalemia in setting of spironolactone 09/2010   Hyperlipidemia    Hypertension    Hypopotassemia    Hypothyroidism 02/08/2013   Long term (current) use of anticoagulants    MDS (myelodysplastic syndrome), low grade ([place]) 10/21/2011   Obesity    Obesity hypoventilation syndrome (HCC)    OSA (obstructive sleep apnea)    Osteoarthrosis, unspecified whether generalized or localized, unspecified site    Other specified disease of white  blood cells    Pacemaker    Implanted December 2012   Paroxysmal A-fib [facility]) with RVR   Amiodarone   Reflux esophagitis    Rosacea 05/15/2013   Senile cataract    Sinus node dysfunction ([place])    Tracheobronchomalacia    Unspecified vitamin D deficiency    Urinary incontinence    Xerostomia    Past Surgical History:  Procedure Laterality Date   CARDIOVERSION N/A 12/17/2019   Procedure: CARDIOVERSION;  Surgeon: Skeet Latch, MD;  Location: [place];  Service: Cardiovascular;  Laterality: N/A;   CARDIOVERSION N/A 04/21/2021   Procedure: CARDIOVERSION;  Surgeon: Jerline Pain, MD;  Location: [place];  Service: Cardiovascular;  Laterality: N/A;   CARDIOVERSION N/A 04/26/2021   Procedure: CARDIOVERSION;  Surgeon: Pixie Casino, MD;  Location: [place] [facility];  Service: Cardiovascular;  Laterality: N/A;   CATARACT EXTRACTION  Dec 21, 2006   PACEMAKER PLACEMENT  10/21/2010   STJ, dural chamber Dr. [name]   TOTAL KNEE ARTHROPLASTY  1999   right knee   TOTAL KNEE ARTHROPLASTY  12-20-01   left knee   TUBAL LIGATION  12/21/62   Tummy tuck  12/21/98   pt "almost died"; respiratory distress, became delrious    Current Outpatient Medications  Medication Sig Dispense Refill   acetaminophen (TYLENOL) 325 MG tablet Take 325-650 mg by mouth every 4 (four) hours as needed for moderate pain.     allopurinol (ZYLOPRIM) 100 MG  tablet Take 100 mg by mouth daily.     bisacodyl (DULCOLAX) 10 MG suppository Place 10 mg rectally as needed for moderate constipation.     dextromethorphan-guaiFENesin (MUCINEX DM) 30-600 MG 12hr tablet Take 1 tablet by mouth 2 (two) times daily as needed (cough / congestion).     diltiazem (CARDIZEM SR) 60 MG 12 hr capsule Take 1 capsule (60 mg total) by mouth 2 (two) times daily. 60 capsule 0   ezetimibe (ZETIA) 10 MG tablet Take 10 mg by mouth every evening.      flecainide (TAMBOCOR) 50 MG tablet Take 1 tablet (50 mg total) by mouth every 12 (twelve) hours. 60 tablet 0    fluticasone (FLONASE) 50 MCG/ACT nasal spray Place 2 sprays into the nose 2 (two) times daily. 2 sprays in each nostril (Patient taking differently: Place 2 sprays into the nose 2 (two) times daily.)     ipratropium-albuterol (DUONEB) 0.5-2.5 (3) MG/3ML SOLN Take 3 mLs by nebulization every 6 (six) hours as needed (Shortness of Breath/Wheezing).     levothyroxine (SYNTHROID, LEVOTHROID) 75 MCG tablet Take 75 mcg by mouth daily before breakfast.      metoprolol succinate (TOPROL XL) 25 MG 24 hr tablet Take 1 tablet (25 mg total) by mouth at bedtime. 30 tablet 6   polyethylene glycol powder (GLYCOLAX/MIRALAX) 17 GM/SCOOP powder Take 17 g by mouth daily as needed for mild constipation.     potassium chloride SA (K-DUR,KLOR-CON) 20 MEQ tablet Take 40 mEq by mouth daily.     Rivaroxaban (XARELTO) 15 MG TABS tablet Take 1 tablet (15 mg total) by mouth daily with supper. 30 tablet    sertraline (ZOLOFT) 50 MG tablet Take 1 tablet (50 mg total) by mouth daily. 30 tablet 3   torsemide (DEMADEX) 20 MG tablet Take 20 mg by mouth daily.     No current facility-administered medications for this visit.    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  none in one year   Drug use: No   Sexual activity: Never  Other Topics Concern   Not on file  Social History Narrative   Widowed 2002 (married  1955), originally form [place]. 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 Moved from [place], [place] to [place], [place] section 2009. Moved to [facility]isted Living section 2012.    Non smoker, mininmal alcohol.    Has POA   Power wheelchair, walker   Exercise none               Social Determinants of Health   Financial Resource Strain: Not on file  Food Insecurity: Not on file  Transportation Needs: Not on file  Physical Activity: Not on file  Stress: Not  on file  Social Connections: Not on file  Intimate Partner Violence: Not on file    Family History: Family History  Problem Relation Age of Onset   Heart disease Father    Parkinson's disease Sister    Heart disease Brother    Parkinson's disease Brother      Review of Systems: All other systems reviewed and are otherwise negative except as noted above.  Physical Exam: Vitals:   05/10/21 0834  BP: 130/68  Pulse: 84  SpO2: 93%  Weight: 211 lb 6.4 oz (95.9 kg)  Height: '5\' 4"'$  (1.626 m)     GEN- The patient is well appearing, alert and oriented x 3 today.   HEENT: normocephalic, atraumatic; sclera clear, conjunctiva pink; hearing intact; oropharynx clear; neck supple  Lungs- Clear to ausculation bilaterally, normal work of breathing.  No wheezes, rales, rhonchi Heart- Regular rate and rhythm, no murmurs, rubs or gallops  GI- soft, non-tender, non-distended, bowel sounds present  Extremities- no clubbing or cyanosis. No edema MS- no significant deformity or atrophy Skin- warm and dry, no rash or lesion; PPM pocket well healed Psych- euthymic mood, full affect Neuro- strength and sensation are intact  PPM Interrogation- reviewed in detail today,  See PACEART report  EKG:  EKG is not ordered today. The ekg ordered 04/26/21 shows A paced at 60 bpm with QRS 150 ms  Recent Labs: 04/20/2021: ALT 10; B Natriuretic Peptide 183.9; TSH 2.546 04/26/2021: BUN  14; Creatinine, Ser 1.51; Hemoglobin 10.8; Magnesium 2.0; Platelets 176; Potassium 3.6; Sodium 141   Wt Readings from Last 3 Encounters:  05/10/21 211 lb 6.4 oz (95.9 kg)  05/03/21 212 lb 9.6 oz (96.4 kg)  04/26/21 211 lb 10.3 oz (96 kg)     Other studies Reviewed: Additional studies/ records that were reviewed today include: Previous EP office notes, Previous remote checks, Most recent labwork.   Echo 04/20/2021 LVEF 60-65%  Myoview 04/23/2021 LVEF 61%, no WMA  Assessment and Plan:  1. Sick sinus syndrome s/p St. Jude PPM  Normal PPM function See Pace Art report No changes today  2. Persistent atrial fibrillation/flutter Back in AF today, since 7/27 (2 days after cardioversion)  today Continue flecainide 50 mg BID. She appears to have had some QRS widening based on EKG 7/25. I will not increase and make close follow up post [facility] with Dr. [name] to make sure this is right medication for her.  Continue xarelto 15 mg daily for CHA2DS2/VASc of at least 5 Previously failed amio due to worsening tremors.  Labs today.    3. CKD III Cr 1.51  BMET today.      Current medicines are reviewed at length with the patient today.   The patient does not have concerns regarding her medicines.  The following changes were made today:  none  Labs/ tests ordered today include:  Orders Placed This Encounter  Procedures   Basic Metabolic Panel (BMET)   CBC   Magnesium   Disposition:   Follow up with Dr. [name] in 7-10 days s/p [facility].     Jacalyn Lefevre, PA-C  05/10/2021 9:08 AM  [facility] [address] [place] [address] [phone] (office) [phone] (fax)

## 2021-05-10 ENCOUNTER — Encounter: Payer: Self-pay | Admitting: Adult Health

## 2021-05-10 ENCOUNTER — Encounter: Payer: Self-pay | Admitting: Student

## 2021-05-10 ENCOUNTER — Other Ambulatory Visit: Payer: Self-pay

## 2021-05-10 ENCOUNTER — Ambulatory Visit (INDEPENDENT_AMBULATORY_CARE_PROVIDER_SITE_OTHER): Payer: Medicare Other | Admitting: Student

## 2021-05-10 VITALS — BP 130/68 | HR 84 | Ht 64.0 in | Wt 211.4 lb

## 2021-05-10 DIAGNOSIS — I4819 Other persistent atrial fibrillation: Secondary | ICD-10-CM

## 2021-05-10 DIAGNOSIS — R5383 Other fatigue: Secondary | ICD-10-CM | POA: Diagnosis not present

## 2021-05-10 DIAGNOSIS — I5032 Chronic diastolic (congestive) heart failure: Secondary | ICD-10-CM | POA: Diagnosis not present

## 2021-05-10 DIAGNOSIS — R2689 Other abnormalities of gait and mobility: Secondary | ICD-10-CM | POA: Diagnosis not present

## 2021-05-10 DIAGNOSIS — R2681 Unsteadiness on feet: Secondary | ICD-10-CM | POA: Diagnosis not present

## 2021-05-10 DIAGNOSIS — I480 Paroxysmal atrial fibrillation: Secondary | ICD-10-CM | POA: Diagnosis not present

## 2021-05-10 DIAGNOSIS — R0602 Shortness of breath: Secondary | ICD-10-CM | POA: Diagnosis not present

## 2021-05-10 DIAGNOSIS — I495 Sick sinus syndrome: Secondary | ICD-10-CM | POA: Diagnosis not present

## 2021-05-10 DIAGNOSIS — J9811 Atelectasis: Secondary | ICD-10-CM | POA: Diagnosis not present

## 2021-05-10 LAB — CUP PACEART INCLINIC DEVICE CHECK
Battery Remaining Longevity: 0 mo
Battery Voltage: 2.62 V
Brady Statistic RA Percent Paced: 57 %
Brady Statistic RV Percent Paced: 1.9 %
Date Time Interrogation Session: 20220808091028
Implantable Lead Implant Date: 20120119
Implantable Lead Implant Date: 20120119
Implantable Lead Location: 753859
Implantable Lead Location: 753860
Implantable Lead Model: 1948
Implantable Pulse Generator Implant Date: 20120119
Lead Channel Impedance Value: 362.5 Ohm
Lead Channel Impedance Value: 700 Ohm
Lead Channel Pacing Threshold Amplitude: 0.75 V
Lead Channel Pacing Threshold Amplitude: 0.75 V
Lead Channel Pacing Threshold Amplitude: 0.75 V
Lead Channel Pacing Threshold Pulse Width: 0.5 ms
Lead Channel Pacing Threshold Pulse Width: 0.5 ms
Lead Channel Pacing Threshold Pulse Width: 0.5 ms
Lead Channel Sensing Intrinsic Amplitude: 2.6 mV
Lead Channel Sensing Intrinsic Amplitude: 8.7 mV
Lead Channel Setting Pacing Amplitude: 1.75 V
Lead Channel Setting Pacing Amplitude: 2.5 V
Lead Channel Setting Pacing Pulse Width: 0.5 ms
Lead Channel Setting Sensing Sensitivity: 2 mV
Pulse Gen Model: 2210
Pulse Gen Serial Number: 7198677

## 2021-05-10 LAB — BASIC METABOLIC PANEL
BUN/Creatinine Ratio: 17 (ref 12–28)
BUN: 26 mg/dL (ref 8–27)
CO2: 40 mmol/L — ABNORMAL HIGH (ref 20–29)
Calcium: 8.5 mg/dL — ABNORMAL LOW (ref 8.7–10.3)
Chloride: 98 mmol/L (ref 96–106)
Creatinine, Ser: 1.49 mg/dL — ABNORMAL HIGH (ref 0.57–1.00)
Glucose: 120 mg/dL — ABNORMAL HIGH (ref 65–99)
Potassium: 4.2 mmol/L (ref 3.5–5.2)
Sodium: 141 mmol/L (ref 134–144)
eGFR: 34 mL/min/{1.73_m2} — ABNORMAL LOW (ref >59)

## 2021-05-10 LAB — CBC
Hematocrit: 29.6 % — ABNORMAL LOW (ref 34.0–46.6)
Hemoglobin: 9.3 g/dL — ABNORMAL LOW (ref 11.1–15.9)
MCH: 30.6 pg (ref 26.6–33.0)
MCHC: 31.4 g/dL — ABNORMAL LOW (ref 31.5–35.7)
MCV: 97 fL (ref 79–97)
Platelets: 201 10*3/uL (ref 150–450)
RBC: 3.04 x10E6/uL — ABNORMAL LOW (ref 3.77–5.28)
RDW: 14 % (ref 11.7–15.4)
WBC: 9.8 10*3/uL (ref 3.4–10.8)

## 2021-05-10 LAB — MAGNESIUM
Magnesium: 2.3 mg/dL (ref 1.6–2.3)
Magnesium: 2.4

## 2021-05-10 NOTE — Patient Instructions (Signed)
Medication Instructions:  Your physician recommends that you continue on your current medications as directed. Please refer to the Current Medication list given to you today.  *If you need a refill on your cardiac medications before your next appointment, please call your pharmacy*   Other Instructions  You are scheduled for a  Cardioversion on 05/13/2021 with Dr. Johnsie Cancel.  Please arrive at the [facility] (Main Entrance A) at [facility]: [address] [place], [place] [address] at 10:30 am.  DIET: Nothing to eat or drink after midnight except a sip of water with medications (see medication instructions below)  FYI: For your safety, and to allow [place] to monitor your vital signs accurately during the surgery/procedure we request that   if you have artificial nails, gel coating, SNS etc. Please have those removed prior to your surgery/procedure. Not having the nail coverings /polish removed may result in cancellation or delay of your surgery/procedure.   Medication Instructions: Hold Torsemide the morning of your procedure  Continue your anticoagulant: Xarelto You will need to continue your anticoagulant after your procedure until you  are told by your  Provider that it is safe to stop   Labs: TODAY: BMET, CBC, Mg  You must have a responsible person to drive you home and stay in the waiting area during your procedure. Failure to do so could result in cancellation.  Bring your insurance cards.  *Special Note: Every effort is made to have your procedure done on time. Occasionally there are emergencies that occur at the hospital that may cause delays. Please be patient if a delay does occur.

## 2021-05-11 DIAGNOSIS — J9811 Atelectasis: Secondary | ICD-10-CM | POA: Diagnosis not present

## 2021-05-11 DIAGNOSIS — R0602 Shortness of breath: Secondary | ICD-10-CM | POA: Diagnosis not present

## 2021-05-11 DIAGNOSIS — D631 Anemia in chronic kidney disease: Secondary | ICD-10-CM | POA: Diagnosis not present

## 2021-05-11 DIAGNOSIS — R2681 Unsteadiness on feet: Secondary | ICD-10-CM | POA: Diagnosis not present

## 2021-05-11 DIAGNOSIS — I480 Paroxysmal atrial fibrillation: Secondary | ICD-10-CM | POA: Diagnosis not present

## 2021-05-11 DIAGNOSIS — R5383 Other fatigue: Secondary | ICD-10-CM | POA: Diagnosis not present

## 2021-05-11 DIAGNOSIS — R2689 Other abnormalities of gait and mobility: Secondary | ICD-10-CM | POA: Diagnosis not present

## 2021-05-11 LAB — BASIC METABOLIC PANEL
BUN: 26 — AB (ref 4–21)
CO2: 30 — AB (ref 13–22)
Chloride: 95 — AB (ref 99–108)
Creatinine: 1.5 — AB (ref 0.5–1.1)
Glucose: 77
Potassium: 4.5 (ref 3.4–5.3)
Sodium: 146 (ref 137–147)

## 2021-05-12 ENCOUNTER — Encounter: Payer: Self-pay | Admitting: Internal Medicine

## 2021-05-12 ENCOUNTER — Encounter: Payer: Medicare Other | Admitting: Internal Medicine

## 2021-05-12 LAB — COMPREHENSIVE METABOLIC PANEL: Calcium: 8.8 (ref 8.7–10.7)

## 2021-05-13 ENCOUNTER — Other Ambulatory Visit: Payer: Self-pay

## 2021-05-13 ENCOUNTER — Encounter (HOSPITAL_COMMUNITY): Payer: Self-pay | Admitting: Cardiovascular Disease

## 2021-05-13 ENCOUNTER — Ambulatory Visit (HOSPITAL_COMMUNITY): Payer: Medicare Other | Admitting: Anesthesiology

## 2021-05-13 ENCOUNTER — Encounter (HOSPITAL_COMMUNITY)
Admission: RE | Disposition: A | Discharge status: Home / Self Care | Payer: Self-pay | Source: Ambulatory Visit | Attending: Cardiovascular Disease

## 2021-05-13 ENCOUNTER — Ambulatory Visit (HOSPITAL_COMMUNITY)
Admission: RE | Admit: 2021-05-13 | Discharge: 2021-05-13 | Disposition: A | Discharge status: Home / Self Care | Payer: Medicare Other | Source: Ambulatory Visit | Attending: Cardiovascular Disease | Admitting: Cardiovascular Disease

## 2021-05-13 DIAGNOSIS — R5383 Other fatigue: Secondary | ICD-10-CM | POA: Diagnosis not present

## 2021-05-13 DIAGNOSIS — I4891 Unspecified atrial fibrillation: Secondary | ICD-10-CM

## 2021-05-13 DIAGNOSIS — I4819 Other persistent atrial fibrillation: Secondary | ICD-10-CM | POA: Diagnosis not present

## 2021-05-13 DIAGNOSIS — Z79899 Other long term (current) drug therapy: Secondary | ICD-10-CM | POA: Diagnosis not present

## 2021-05-13 DIAGNOSIS — Z888 Allergy status to other drugs, medicaments and biological substances status: Secondary | ICD-10-CM | POA: Diagnosis not present

## 2021-05-13 DIAGNOSIS — Z79890 Hormone replacement therapy (postmenopausal): Secondary | ICD-10-CM | POA: Diagnosis not present

## 2021-05-13 DIAGNOSIS — I4892 Unspecified atrial flutter: Secondary | ICD-10-CM | POA: Insufficient documentation

## 2021-05-13 DIAGNOSIS — Z950 Presence of cardiac pacemaker: Secondary | ICD-10-CM | POA: Insufficient documentation

## 2021-05-13 DIAGNOSIS — I495 Sick sinus syndrome: Secondary | ICD-10-CM | POA: Insufficient documentation

## 2021-05-13 DIAGNOSIS — I480 Paroxysmal atrial fibrillation: Secondary | ICD-10-CM | POA: Diagnosis not present

## 2021-05-13 DIAGNOSIS — N183 Chronic kidney disease, stage 3 unspecified: Secondary | ICD-10-CM | POA: Insufficient documentation

## 2021-05-13 DIAGNOSIS — Z885 Allergy status to narcotic agent status: Secondary | ICD-10-CM | POA: Insufficient documentation

## 2021-05-13 DIAGNOSIS — Z87891 Personal history of nicotine dependence: Secondary | ICD-10-CM | POA: Diagnosis not present

## 2021-05-13 DIAGNOSIS — Z7901 Long term (current) use of anticoagulants: Secondary | ICD-10-CM | POA: Insufficient documentation

## 2021-05-13 DIAGNOSIS — R2681 Unsteadiness on feet: Secondary | ICD-10-CM | POA: Diagnosis not present

## 2021-05-13 DIAGNOSIS — I130 Hypertensive heart and chronic kidney disease with heart failure and stage 1 through stage 4 chronic kidney disease, or unspecified chronic kidney disease: Secondary | ICD-10-CM | POA: Insufficient documentation

## 2021-05-13 DIAGNOSIS — R0602 Shortness of breath: Secondary | ICD-10-CM | POA: Diagnosis not present

## 2021-05-13 DIAGNOSIS — J9811 Atelectasis: Secondary | ICD-10-CM | POA: Diagnosis not present

## 2021-05-13 DIAGNOSIS — I5032 Chronic diastolic (congestive) heart failure: Secondary | ICD-10-CM | POA: Diagnosis not present

## 2021-05-13 DIAGNOSIS — I110 Hypertensive heart disease with heart failure: Secondary | ICD-10-CM | POA: Diagnosis not present

## 2021-05-13 DIAGNOSIS — R2689 Other abnormalities of gait and mobility: Secondary | ICD-10-CM | POA: Diagnosis not present

## 2021-05-13 DIAGNOSIS — E785 Hyperlipidemia, unspecified: Secondary | ICD-10-CM | POA: Diagnosis not present

## 2021-05-13 HISTORY — PX: CARDIOVERSION: SHX1299

## 2021-05-13 SURGERY — CARDIOVERSION
Anesthesia: General

## 2021-05-13 MED ORDER — LIDOCAINE 2% (20 MG/ML) 5 ML SYRINGE
INTRAMUSCULAR | Status: DC | PRN
  Administered 2021-05-13: 60 mg via INTRAVENOUS

## 2021-05-13 MED ORDER — PROPOFOL 10 MG/ML IV BOLUS
INTRAVENOUS | Status: DC | PRN
Start: 2021-05-13 — End: 2021-05-13
  Administered 2021-05-13: 50 mg via INTRAVENOUS

## 2021-05-13 NOTE — Discharge Instructions (Signed)

## 2021-05-13 NOTE — Anesthesia Postprocedure Evaluation (Signed)
Anesthesia Post Note  Patient: Cindy Cervantes  Procedure(s) Performed: CARDIOVERSION     Patient location during evaluation: PACU Anesthesia Type: General Level of consciousness: awake and alert, oriented and patient cooperative Pain management: pain level controlled Vital Signs Assessment: post-procedure vital signs reviewed and stable Respiratory status: spontaneous breathing, nonlabored ventilation and respiratory function stable Cardiovascular status: blood pressure returned to baseline and stable Postop Assessment: no apparent nausea or vomiting Anesthetic complications: no   No notable events documented.  Last Vitals:  Vitals:   05/13/21 1153 05/13/21 1155  BP:  (!) 106/43  Pulse: (!) 59 (!) 59  Resp: 17 16  Temp:    SpO2: 98% 98%    Last Pain:  Vitals:   05/13/21 1155  TempSrc:   PainSc: 0-No pain                 Pervis Hocking

## 2021-05-13 NOTE — CV Procedure (Signed)
DCC: On Rx anticoagulation no missed doses Anesthesia :  Propofol   DCC x 1 120 J biphasic  Converted from afib with V pacing to AV pacing with capture  No immediate neurologic sequelae  Jenkins Rouge MD [facility]

## 2021-05-13 NOTE — Transfer of Care (Signed)
Immediate Anesthesia Transfer of Care Note  Patient: Cindy Cervantes  Procedure(s) Performed: CARDIOVERSION  Patient Location: Endoscopy Unit  Anesthesia Type:General  Level of Consciousness: awake, alert  and oriented  Airway & Oxygen Therapy: Patient Spontanous Breathing  Post-op Assessment: Report given to RN and Post -op Vital signs reviewed and stable  Post vital signs: Reviewed and stable  Last Vitals:  Vitals Value Taken Time  BP 110/69 05/13/21 1151  Temp    Pulse 59 05/13/21 1153  Resp 17 05/13/21 1153  SpO2 98 % 05/13/21 1153    Last Pain:  Vitals:   05/13/21 1114  TempSrc: Temporal  PainSc: 0-No pain         Complications: No notable events documented.

## 2021-05-13 NOTE — Anesthesia Preprocedure Evaluation (Addendum)
Anesthesia Evaluation  Patient identified by MRN, date of birth, ID band Patient awake    Reviewed: Allergy & Precautions, NPO status , Patient's Chart, lab work & pertinent test results, reviewed documented beta blocker date and time   Airway Mallampati: II  TM Distance: >3 FB Neck ROM: Full    Dental  (+) Teeth Intact, Dental Advisory Given,    Pulmonary shortness of breath and with exertion, sleep apnea and Continuous Positive Airway Pressure Ventilation , former smoker,  Quit smoking 1984 Tracheobronchomalacia  Right hemidiaphragm dysfunction On home O2 2LPM   Pulmonary exam normal breath sounds clear to auscultation       Cardiovascular hypertension, Pt. on medications and Pt. on home beta blockers Normal cardiovascular exam+ dysrhythmias (xarelto) Atrial Fibrillation + pacemaker (2012)  Rhythm:Irregular Rate:Normal  Echo 04/2021: 1. Left ventricular ejection fraction, by estimation, is 60 to 65%. The  left ventricle has normal function. The left ventricle has no regional  wall motion abnormalities. Left ventricular diastolic function could not  be evaluated.  2. Right ventricular systolic function is normal. The right ventricular  size is normal. There is normal pulmonary artery systolic pressure.  3. The mitral valve is normal in structure. Trivial mitral valve  regurgitation.  4. The aortic valve is grossly normal. There is moderate calcification of  the aortic valve. There is moderate thickening of the aortic valve. Aortic  valve regurgitation is not visualized. Mild to moderate aortic valve  sclerosis/calcification is present,  without any evidence of aortic stenosis.  5. The inferior vena cava is normal in size with <50% respiratory  variability, suggesting right atrial pressure of 8 mmHg.    Neuro/Psych PSYCHIATRIC DISORDERS Depression negative neurological ROS     GI/Hepatic Neg liver ROS, GERD  Controlled,   Endo/Other  Hypothyroidism Obesity BMI 36  Renal/GU Renal InsufficiencyRenal diseaseCr 1.5  negative genitourinary   Musculoskeletal  (+) Arthritis , Osteoarthritis,    Abdominal (+) + obese,   Peds  Hematology  (+) Blood dyscrasia, anemia , H/H 9.3/29.6   Anesthesia Other Findings   Reproductive/Obstetrics negative OB ROS                            Anesthesia Physical Anesthesia Plan  ASA: 3  Anesthesia Plan: General   Post-op Pain Management:    Induction: Intravenous  PONV Risk Score and Plan: TIVA and Treatment may vary due to age or medical condition  Airway Management Planned: Natural Airway and Mask  Additional Equipment: None  Intra-op Plan:   Post-operative Plan:   Informed Consent: I have reviewed the patients History and Physical, chart, labs and discussed the procedure including the risks, benefits and alternatives for the proposed anesthesia with the patient or authorized representative who has indicated his/her understanding and acceptance.     Dental advisory given  Plan Discussed with: CRNA  Anesthesia Plan Comments:        Anesthesia Quick Evaluation

## 2021-05-13 NOTE — Anesthesia Procedure Notes (Signed)
Procedure Name: General with mask airway Date/Time: 05/13/2021 11:42 AM Performed by: Griffin Dakin, CRNA Pre-anesthesia Checklist: Patient identified, Emergency Drugs available, Suction available, Patient being monitored and Timeout performed Patient Re-evaluated:Patient Re-evaluated prior to induction Oxygen Delivery Method: Ambu bag Preoxygenation: Pre-oxygenation with 100% oxygen Induction Type: IV induction Ventilation: Mask ventilation without difficulty Placement Confirmation: positive ETCO2 and breath sounds checked- equal and bilateral Dental Injury: Teeth and Oropharynx as per pre-operative assessment

## 2021-05-13 NOTE — Interval H&P Note (Signed)
History and Physical Interval Note:  05/13/2021 11:40 AM  Cindy Cervantes  has presented today for surgery, with the diagnosis of A-FIB.  The various methods of treatment have been discussed with the patient and family. After consideration of risks, benefits and other options for treatment, the patient has consented to  Procedure(s): CARDIOVERSION (N/A) as a surgical intervention.  The patient's history has been reviewed, patient examined, no change in status, stable for surgery.  I have reviewed the patient's chart and labs.  Questions were answered to the patient's satisfaction.     Jenkins Rouge

## 2021-05-13 NOTE — Progress Notes (Signed)
Remote pacemaker transmission.   

## 2021-05-14 ENCOUNTER — Encounter (HOSPITAL_COMMUNITY): Payer: Self-pay | Admitting: Cardiovascular Disease

## 2021-05-17 ENCOUNTER — Encounter: Payer: Self-pay | Admitting: Internal Medicine

## 2021-05-17 ENCOUNTER — Ambulatory Visit (INDEPENDENT_AMBULATORY_CARE_PROVIDER_SITE_OTHER): Payer: Medicare Other

## 2021-05-17 ENCOUNTER — Non-Acute Institutional Stay (SKILLED_NURSING_FACILITY): Payer: Medicare Other | Admitting: Internal Medicine

## 2021-05-17 DIAGNOSIS — E039 Hypothyroidism, unspecified: Secondary | ICD-10-CM

## 2021-05-17 DIAGNOSIS — I495 Sick sinus syndrome: Secondary | ICD-10-CM

## 2021-05-17 DIAGNOSIS — G250 Essential tremor: Secondary | ICD-10-CM | POA: Diagnosis not present

## 2021-05-17 DIAGNOSIS — N1832 Chronic kidney disease, stage 3b: Secondary | ICD-10-CM

## 2021-05-17 DIAGNOSIS — R0602 Shortness of breath: Secondary | ICD-10-CM | POA: Diagnosis not present

## 2021-05-17 DIAGNOSIS — I5032 Chronic diastolic (congestive) heart failure: Secondary | ICD-10-CM | POA: Diagnosis not present

## 2021-05-17 DIAGNOSIS — D638 Anemia in other chronic diseases classified elsewhere: Secondary | ICD-10-CM | POA: Diagnosis not present

## 2021-05-17 DIAGNOSIS — M1A9XX0 Chronic gout, unspecified, without tophus (tophi): Secondary | ICD-10-CM | POA: Diagnosis not present

## 2021-05-17 DIAGNOSIS — I4892 Unspecified atrial flutter: Secondary | ICD-10-CM | POA: Diagnosis not present

## 2021-05-17 DIAGNOSIS — J9811 Atelectasis: Secondary | ICD-10-CM | POA: Diagnosis not present

## 2021-05-17 DIAGNOSIS — R2689 Other abnormalities of gait and mobility: Secondary | ICD-10-CM | POA: Diagnosis not present

## 2021-05-17 DIAGNOSIS — I480 Paroxysmal atrial fibrillation: Secondary | ICD-10-CM | POA: Diagnosis not present

## 2021-05-17 DIAGNOSIS — R2681 Unsteadiness on feet: Secondary | ICD-10-CM | POA: Diagnosis not present

## 2021-05-17 DIAGNOSIS — F321 Major depressive disorder, single episode, moderate: Secondary | ICD-10-CM

## 2021-05-17 DIAGNOSIS — R5383 Other fatigue: Secondary | ICD-10-CM | POA: Diagnosis not present

## 2021-05-17 NOTE — Telephone Encounter (Signed)
Patients daughter called and states she has some concerns about her mother with lack of energy and being back in AF. States the nurse reports patient had increased swelling in her legs. Attempted to call the nurse at [facility] to discuss. Left message with Autumn to have patients nurse call.   [facility] [phone]

## 2021-05-17 NOTE — Progress Notes (Signed)
Provider:  Veleta Miners MD Location:   [facility] Room Number: [id number] Place of Service:  ALF ([id number])  PCP: Virgie Dad, MD Patient Care Team: Virgie Dad, MD as PCP - General (Internal Medicine) Deboraha Sprang, MD as PCP - Electrophysiology (Cardiology) [facility] Pear, MD as Consulting Physician (Pulmonary Disease) Larey Dresser, MD as Consulting Physician (Cardiology) Lafayette Dragon, MD (Inactive) as Consulting Physician (Gastroenterology) Volanda Napoleon, MD as Consulting Physician (Oncology) Ninetta Lights, MD (Inactive) as Consulting Physician (Orthopedic Surgery) Sydnee Levans, MD as Consulting Physician (Dermatology) Barbaraann Cao, OD as Referring Physician (Optometry)  Extended Emergency Contact Information Primary Emergency Contact: Gooding,Leslie Address: [address] [place]          Clotilde Dieter of [place] Phone: [phone] Mobile Phone: [phone] Relation: Daughter Secondary Emergency Contact: Jacquana, [place] Mobile Phone: [phone] Relation: Son  Code Status: DNR Goals of Care: Advanced Directive information Advanced Directives 05/17/2021  Does Patient Have a Medical Advance Directive? Yes  Type of Paramedic of [place];Out of facility DNR (pink MOST or yellow form)  Does patient want to make changes to medical advance directive? No - Patient declined  Copy of [place] in Chart? Yes - validated most recent copy scanned in chart (See row information)  Pre-existing out of facility DNR order (yellow form or pink MOST form) Yellow form placed in chart (order not valid for inpatient use)      Chief Complaint  Patient presents with   New Admit To SNF    Admission to Rehab.    HPI: Patient is a 87 y.o. female seen today for admission to SNF for Therapy and Care  Patient has h/o Diastolic CHF Echo in [id number]  showed EF of 60%, H/o Atrial Flutter has been through 2 cardioversion 04/26/21 and 05/13/21 SSS s/p Pacemaker Hypothyroid CKD HLD  OSA Uses CPAP Chronic Hypoxia on Oxygen  Has not been feeling well for past few weeks SOB on exertion , Fatigue,weakness. Denies Dizziness. No Nausea or Vomiting No Chest pain no fever  Some Cough  Weight up at 211lbs Was 206 couple few months ago  Today moving to Rehab for few weeks as not able to manage in AL by herself Past Medical History:  Diagnosis Date   Allergic rhinitis    Anemia of renal disease 10/21/2011   Anemia, iron deficiency 10/21/2011   Cholelithiasis    Chronic diastolic heart failure (HCC)    Colon polyp    Constipation    Cough    2nd to reflux   Debility, unspecified    Dehydration    Depression    Diaphragm dysfunction    Right hemidiaphragm   Disorder of bone and cartilage, unspecified    Diverticulosis of colon    Dysphagia    GERD (gastroesophageal reflux disease) 02/08/2013   History of hyperkalemia in setting of spironolactone 09/2010   Hyperlipidemia    Hypertension    Hypopotassemia    Hypothyroidism 02/08/2013   Long term (current) use of anticoagulants    MDS (myelodysplastic syndrome), low grade ([place]) 10/21/2011   Obesity    Obesity hypoventilation syndrome (HCC)    OSA (obstructive sleep apnea)    Osteoarthrosis, unspecified whether generalized or localized, unspecified site    Other specified disease of white blood cells    Pacemaker    Implanted December 2012   Paroxysmal A-fib [facility]) with RVR   Amiodarone  Reflux esophagitis    Rosacea 05/15/2013   Senile cataract    Sinus node dysfunction (HCC)    Tracheobronchomalacia    Unspecified vitamin D deficiency    Urinary incontinence    Xerostomia    Past Surgical History:  Procedure Laterality Date   CARDIOVERSION N/A 12/17/2019   Procedure: CARDIOVERSION;  Surgeon: Skeet Latch, MD;  Location: [facility];  Service: Cardiovascular;  Laterality:  N/A;   CARDIOVERSION N/A 04/21/2021   Procedure: CARDIOVERSION;  Surgeon: Jerline Pain, MD;  Location: [facility];  Service: Cardiovascular;  Laterality: N/A;   CARDIOVERSION N/A 04/26/2021   Procedure: CARDIOVERSION;  Surgeon: Pixie Casino, MD;  Location: [facility];  Service: Cardiovascular;  Laterality: N/A;   CARDIOVERSION N/A 05/13/2021   Procedure: CARDIOVERSION;  Surgeon: Josue Hector, MD;  Location: [facility] ENDOSCOPY;  Service: Cardiovascular;  Laterality: N/A;   CATARACT EXTRACTION  12-25-2006   PACEMAKER PLACEMENT  10/21/2010   STJ, dural chamber Dr. [name]   TOTAL KNEE ARTHROPLASTY  1999   right knee   TOTAL KNEE ARTHROPLASTY  24-Dec-2001   left knee   TUBAL LIGATION  1962-12-25   Tummy tuck  12/25/98   pt "almost died"; respiratory distress, became delrious    reports that she quit smoking about 38 years ago. Her smoking use included cigarettes. She smoked an average of .5 packs per day. She has never used smokeless tobacco. She reports current alcohol use. She reports that she does not use drugs. Social History   Socioeconomic History   Marital status: Widowed    Spouse name: Not on file   Number of children: Not on file   Years of education: Not on file   Highest education level: Not on file  Occupational History   Occupation: retired    Fish farm manager: RETIRED    Comment: Network engineer   Tobacco Use   Smoking status: Former    Packs/day: 0.50    Types: Cigarettes    Quit date: 10/03/1982    Years since quitting: 38.6   Smokeless tobacco: Never  Vaping Use   Vaping Use: Never used  Substance and Sexual Activity   Alcohol use: Yes    Comment: very rarely.  none in one year   Drug use: No   Sexual activity: Never  Other Topics Concern   Not on file  Social History Narrative   Widowed 2000/12/24 (married 12/24/53), originally form [place]. 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 Moved from [place], [place] to [place], [place]  section 12-25-2007. Moved to [facility]isted Living section December 25, 2010.    Non smoker, mininmal alcohol.    Has POA   Power wheelchair, walker   Exercise none               Social Determinants of Health   Financial Resource Strain: Not on file  Food Insecurity: Not on file  Transportation Needs: Not on file  Physical Activity: Not on file  Stress: Not on file  Social Connections: Not on file  Intimate Partner Violence: Not on file    Functional Status Survey:    Family History  Problem Relation Age of Onset   Heart disease Father    Parkinson's disease Sister    Heart disease Brother    Parkinson's disease Brother     Health Maintenance  Topic Date Due   COVID-19 Vaccine (4 - Booster) 11/17/2020   INFLUENZA VACCINE  05/03/2021   TETANUS/TDAP  07/07/2025   DEXA SCAN  Completed   PNA vac Low Risk Adult  Completed   Zoster Vaccines- Shingrix  Completed   HPV VACCINES  Aged Out    Allergies  Allergen Reactions   Codeine     Extreme lethargy   Iron     By infusion - back spasms    Morphine     Hallucinations, altered mental state   Toviaz [Fesoterodine Fumarate]     hyperactive    Allergies as of 05/17/2021       Reactions   Codeine    Extreme lethargy   Iron    By infusion - back spasms    Morphine    Hallucinations, altered mental state   Toviaz [fesoterodine Fumarate]    hyperactive        Medication List        Accurate as of May 17, 2021 11:54 AM. If you have any questions, ask your nurse or doctor.          acetaminophen 325 MG tablet Commonly known as: TYLENOL Take 325-650 mg by mouth every 4 (four) hours as needed for moderate pain.   allopurinol 100 MG tablet Commonly known as: ZYLOPRIM Take 100 mg by mouth in the morning.   bisacodyl 10 MG suppository Commonly known as: DULCOLAX Place 10 mg rectally as needed for moderate constipation.   dextromethorphan-guaiFENesin 30-600 MG 12hr tablet Commonly known as: MUCINEX DM Take 1 tablet by  mouth 2 (two) times daily as needed (cough / congestion).   diltiazem 60 MG 12 hr capsule Commonly known as: CARDIZEM SR Take 1 capsule (60 mg total) by mouth 2 (two) times daily.   ezetimibe 10 MG tablet Commonly known as: ZETIA Take 10 mg by mouth every evening.   flecainide 50 MG tablet Commonly known as: TAMBOCOR Take 1 tablet (50 mg total) by mouth every 12 (twelve) hours.   fluticasone 50 MCG/ACT nasal spray Commonly known as: FLONASE Place 2 sprays into the nose 2 (two) times daily. 2 sprays in each nostril   ipratropium-albuterol 0.5-2.5 (3) MG/3ML Soln Commonly known as: DUONEB Take 3 mLs by nebulization every 6 (six) hours as needed (Shortness of Breath/Wheezing).   levothyroxine 75 MCG tablet Commonly known as: SYNTHROID Take 75 mcg by mouth daily before breakfast.   metoprolol succinate 25 MG 24 hr tablet Commonly known as: Toprol XL Take 1 tablet (25 mg total) by mouth at bedtime.   mineral oil-hydrophilic petrolatum ointment Apply 1 application topically as needed for dry skin (apply to affected area(s) of left forehead).   polyethylene glycol powder 17 GM/SCOOP powder Commonly known as: GLYCOLAX/MIRALAX Take 17 g by mouth daily as needed for mild constipation.   potassium chloride SA 20 MEQ tablet Commonly known as: KLOR-CON Take 40 mEq by mouth in the morning.   Rivaroxaban 15 MG Tabs tablet Commonly known as: Xarelto Take 1 tablet (15 mg total) by mouth daily with supper.   sertraline 50 MG tablet Commonly known as: ZOLOFT Take 1 tablet (50 mg total) by mouth daily.   torsemide 20 MG tablet Commonly known as: DEMADEX Take 20 mg by mouth in the morning.        Review of Systems  Constitutional:  Positive for activity change and unexpected weight change.  HENT: Negative.    Respiratory:  Positive for cough and shortness of breath.   Cardiovascular:  Positive for leg swelling.  Gastrointestinal: Negative.   Genitourinary: Negative.    Musculoskeletal:  Positive for back pain.  Skin: Negative.  Neurological:  Negative for dizziness.  Psychiatric/Behavioral: Negative.     Vitals:   05/17/21 1057  BP: 108/67  Pulse: 79  Resp: 18  Temp: 97.9 F (36.6 C)  SpO2: 93%  Weight: 211 lb (95.7 kg)  Height: '5\' 3"'$  (1.6 m)   Body mass index is 37.38 kg/m. Physical Exam Vitals reviewed.  Constitutional:      Appearance: Normal appearance.  HENT:     Head: Normocephalic.     Nose: Nose normal.     Mouth/Throat:     Mouth: Mucous membranes are moist.     Pharynx: Oropharynx is clear.  Eyes:     Pupils: Pupils are equal, round, and reactive to light.  Cardiovascular:     Rate and Rhythm: Normal rate and regular rhythm.     Pulses: Normal pulses.     Heart sounds: Normal heart sounds.  Pulmonary:     Comments: Rales Bilateral Abdominal:     General: Abdomen is flat. Bowel sounds are normal.     Palpations: Abdomen is soft.  Musculoskeletal:        General: Swelling present.     Cervical back: Neck supple.  Skin:    General: Skin is warm.  Neurological:     General: No focal deficit present.     Mental Status: She is alert and oriented to person, place, and time.  Psychiatric:        Mood and Affect: Mood normal.        Thought Content: Thought content normal.    Labs reviewed: Basic Metabolic Panel: Recent Labs    04/25/21 0453 04/26/21 0210 05/03/21 0000 05/10/21 0926  NA 139 141 147 141  K 3.4* 3.6 4.9 4.2  CL 97* 97* 100 98  CO2 33* 38* 26* 40*  GLUCOSE 103* 101*  --  120*  BUN '14 14 17 26  '$ CREATININE 1.39* 1.51* 1.6* 1.49*  CALCIUM 8.5* 8.6* 8.9 8.5*  MG 2.0 2.0 2.4 2.3   Liver Function Tests: Recent Labs    10/05/20 0000 04/20/21 0135 05/03/21 0000  AST  --  23 13  ALT  --  10 14  ALKPHOS  --  80 103  BILITOT  --  1.0  --   PROT  --  6.1*  --   ALBUMIN 3.4* 3.0* 3.6   No results for input(s): LIPASE, AMYLASE in the last 8760 hours. No results for input(s): AMMONIA in the last  8760 hours. CBC: Recent Labs    04/20/21 0135 04/20/21 1011 04/25/21 0453 04/26/21 0210 05/03/21 0000 05/10/21 0926  WBC 9.6   < > 7.6 8.0 7.7 9.8  NEUTROABS 7.4  --   --   --   --   --   HGB 10.5*   < > 10.3* 10.8* 10.1* 9.3*  HCT 35.5*   < > 33.1* 35.7* 32* 29.6*  MCV 105.7*   < > 102.2* 104.1*  --  97  PLT 188   < > 154 176 159 201   < > = values in this interval not displayed.   Cardiac Enzymes: No results for input(s): CKTOTAL, CKMB, CKMBINDEX, TROPONINI in the last 8760 hours. BNP: Invalid input(s): POCBNP Lab Results  Component Value Date   HGBA1C 5.5 09/06/2016   Lab Results  Component Value Date   TSH 4.77 05/03/2021   Lab Results  Component Value Date   VITAMINB12 485 11/01/2010   Lab Results  Component Value Date   FOLATE  09/11/2010  8.9 (NOTE)  Reference Ranges        Deficient:       0.4 - 3.3 ng/mL        Indeterminate:   3.4 - 5.4 ng/mL        Normal:              > 5.4 ng/mL   Lab Results  Component Value Date   IRON 42 10/11/2012   TIBC 285 10/11/2012   FERRITIN 428 (H) 10/11/2012    Imaging and Procedures obtained prior to SNF admission: No results found.  Assessment/Plan Chronic diastolic heart failure (HCC) Chane Demadex to 20 mg BID Change Potassium to 40 in the morning and 20 in the evening Chest Xray for CHF BNP ordered Repeat BMP and CBC in 1 Week  Atrial flutter, unspecified type (HCC) On Flecainide and Cardizem and Lopressor Also on Xarelto  Acquired hypothyroidism TSH normal in 8/22  Stage 3b chronic kidney disease ([place]) Creat stable Will repeat after going up on Toresimide  SICK SINUS SYNDROME S/P Pacemaker  Anemia of chronic disease Check Iron and B 12 level  Benign essential tremor On Metoprolol  Depression, major, single episode, moderate (HCC) On Zoloft  Chronic gout without tophus, unspecified cause, unspecified site Allopurinol   Family/ staff Communication:   Labs/tests ordered:

## 2021-05-17 NOTE — Telephone Encounter (Signed)
Remote transmission reviewed and appears patient is back in AF. Is not uncommon within the first 3 months post cardioversion. Cardioversion was completed on 05/13/21, patient was back in NR. Attempted to call to follow up. No answer, unable to leave VM d.t phone ringing continuously.

## 2021-05-18 ENCOUNTER — Encounter: Payer: Self-pay | Admitting: Internal Medicine

## 2021-05-18 DIAGNOSIS — I480 Paroxysmal atrial fibrillation: Secondary | ICD-10-CM | POA: Diagnosis not present

## 2021-05-18 DIAGNOSIS — R0602 Shortness of breath: Secondary | ICD-10-CM | POA: Diagnosis not present

## 2021-05-18 DIAGNOSIS — R2681 Unsteadiness on feet: Secondary | ICD-10-CM | POA: Diagnosis not present

## 2021-05-18 DIAGNOSIS — J9811 Atelectasis: Secondary | ICD-10-CM | POA: Diagnosis not present

## 2021-05-18 DIAGNOSIS — R2689 Other abnormalities of gait and mobility: Secondary | ICD-10-CM | POA: Diagnosis not present

## 2021-05-18 DIAGNOSIS — I5032 Chronic diastolic (congestive) heart failure: Secondary | ICD-10-CM | POA: Diagnosis not present

## 2021-05-18 DIAGNOSIS — R5383 Other fatigue: Secondary | ICD-10-CM | POA: Diagnosis not present

## 2021-05-19 DIAGNOSIS — J9811 Atelectasis: Secondary | ICD-10-CM | POA: Diagnosis not present

## 2021-05-19 DIAGNOSIS — R2681 Unsteadiness on feet: Secondary | ICD-10-CM | POA: Diagnosis not present

## 2021-05-19 DIAGNOSIS — I480 Paroxysmal atrial fibrillation: Secondary | ICD-10-CM | POA: Diagnosis not present

## 2021-05-19 DIAGNOSIS — R0602 Shortness of breath: Secondary | ICD-10-CM | POA: Diagnosis not present

## 2021-05-19 DIAGNOSIS — R5383 Other fatigue: Secondary | ICD-10-CM | POA: Diagnosis not present

## 2021-05-19 DIAGNOSIS — R2689 Other abnormalities of gait and mobility: Secondary | ICD-10-CM | POA: Diagnosis not present

## 2021-05-19 LAB — CUP PACEART REMOTE DEVICE CHECK
Battery Remaining Longevity: 1 mo
Battery Remaining Percentage: 0.5 %
Battery Voltage: 2.6 V
Brady Statistic AP VP Percent: 52 %
Brady Statistic AP VS Percent: 45 %
Brady Statistic AS VP Percent: 1.2 %
Brady Statistic AS VS Percent: 1.5 %
Brady Statistic RA Percent Paced: 43 %
Brady Statistic RV Percent Paced: 33 %
Date Time Interrogation Session: 20220815041405
Implantable Lead Implant Date: 20120119
Implantable Lead Implant Date: 20120119
Implantable Lead Location: 753859
Implantable Lead Location: 753860
Implantable Lead Model: 1948
Implantable Pulse Generator Implant Date: 20120119
Lead Channel Impedance Value: 350 Ohm
Lead Channel Impedance Value: 610 Ohm
Lead Channel Pacing Threshold Amplitude: 0.75 V
Lead Channel Pacing Threshold Amplitude: 0.875 V
Lead Channel Pacing Threshold Pulse Width: 0.5 ms
Lead Channel Pacing Threshold Pulse Width: 0.5 ms
Lead Channel Sensing Intrinsic Amplitude: 2.2 mV
Lead Channel Sensing Intrinsic Amplitude: 6.6 mV
Lead Channel Setting Pacing Amplitude: 1.875
Lead Channel Setting Pacing Amplitude: 2.5 V
Lead Channel Setting Pacing Pulse Width: 0.5 ms
Lead Channel Setting Sensing Sensitivity: 2 mV
Pulse Gen Model: 2210
Pulse Gen Serial Number: 7198677

## 2021-05-20 DIAGNOSIS — I480 Paroxysmal atrial fibrillation: Secondary | ICD-10-CM | POA: Diagnosis not present

## 2021-05-20 DIAGNOSIS — R2681 Unsteadiness on feet: Secondary | ICD-10-CM | POA: Diagnosis not present

## 2021-05-20 DIAGNOSIS — R2689 Other abnormalities of gait and mobility: Secondary | ICD-10-CM | POA: Diagnosis not present

## 2021-05-20 DIAGNOSIS — J9811 Atelectasis: Secondary | ICD-10-CM | POA: Diagnosis not present

## 2021-05-20 DIAGNOSIS — R5383 Other fatigue: Secondary | ICD-10-CM | POA: Diagnosis not present

## 2021-05-20 DIAGNOSIS — R0602 Shortness of breath: Secondary | ICD-10-CM | POA: Diagnosis not present

## 2021-05-20 LAB — BRAIN NATRIURETIC PEPTIDE: B Natriuretic Peptide: 850

## 2021-05-21 ENCOUNTER — Other Ambulatory Visit: Payer: Self-pay

## 2021-05-21 ENCOUNTER — Encounter: Payer: Self-pay | Admitting: Internal Medicine

## 2021-05-21 ENCOUNTER — Ambulatory Visit (INDEPENDENT_AMBULATORY_CARE_PROVIDER_SITE_OTHER): Payer: Medicare Other | Admitting: Internal Medicine

## 2021-05-21 VITALS — BP 120/62 | HR 88 | Ht 63.0 in | Wt 211.6 lb

## 2021-05-21 DIAGNOSIS — I480 Paroxysmal atrial fibrillation: Secondary | ICD-10-CM | POA: Diagnosis not present

## 2021-05-21 DIAGNOSIS — R2689 Other abnormalities of gait and mobility: Secondary | ICD-10-CM | POA: Diagnosis not present

## 2021-05-21 DIAGNOSIS — I495 Sick sinus syndrome: Secondary | ICD-10-CM

## 2021-05-21 DIAGNOSIS — R2681 Unsteadiness on feet: Secondary | ICD-10-CM | POA: Diagnosis not present

## 2021-05-21 DIAGNOSIS — Z950 Presence of cardiac pacemaker: Secondary | ICD-10-CM | POA: Diagnosis not present

## 2021-05-21 DIAGNOSIS — I4819 Other persistent atrial fibrillation: Secondary | ICD-10-CM | POA: Diagnosis not present

## 2021-05-21 DIAGNOSIS — R0602 Shortness of breath: Secondary | ICD-10-CM | POA: Diagnosis not present

## 2021-05-21 DIAGNOSIS — J9811 Atelectasis: Secondary | ICD-10-CM | POA: Diagnosis not present

## 2021-05-21 DIAGNOSIS — R5383 Other fatigue: Secondary | ICD-10-CM | POA: Diagnosis not present

## 2021-05-21 MED ORDER — FLECAINIDE ACETATE 100 MG PO TABS: 100.0000 mg | ORAL_TABLET | Freq: Two times a day (BID) | ORAL | 3 refills | Status: DC

## 2021-05-21 MED ORDER — TORSEMIDE 20 MG PO TABS: 40.0000 mg | ORAL_TABLET | Freq: Two times a day (BID) | ORAL | 3 refills | Status: AC

## 2021-05-21 NOTE — H&P (View-Only) (Signed)
Cardiology Office Note Date:  05/21/2021  Patient ID:  Cindy Cervantes 1931/12/01, MRN [id number] PCP:  Virgie Dad, MD  Electrophysiology: Dr. [name] [facility] Chesley Mires, MD as Consulting Physician (Pulmonary Disease) Volanda Napoleon, MD as Consulting Physician (Oncology) Trixie Rude., MD as Consulting Physician (Ophthalmology)   Chief Complaint:  Routine EP, pacer visit  History of Present Illness: Cindy Cervantes is a 87 y.o. female  Seen in followup for her paroxysmal atrial fibrillation with a rapid ventricular response and sinus node dysfunction for which she required [facility] Jude pacemaker implantation in the fall of 2012. Device approaching ERI   Admitted 7/22 w chest pain and shortness of breath.  Found to be in atrial flutter and underwent cardioversion.  Developed ERAF underwent repeat cardioversion and was begun on flecainide previously failed amiodarone secondary to tremors Using home O2 2/2     Recurrent atrial fib DCCV 8/22 with reversion to AFib shortly thereafter overall, she has felt crummy for about 2 or 3 months, and this correlates with the onset of atrial fibrillation in mid June.  A few months ago she began to feel ill and fatigued constantly. At the time she was not physically active and mostly sat in her chair.     Also, she is having difficulty with her eyes. She endorses double vision. Testing in clinic today indicates double vision on right lateral gaze. She denies having headaches.  She does not take iron supplements due to constipation issues.  The patient denies chest pain, nocturnal dyspnea, orthopnea.  There have been no palpitations, lightheadedness or syncope.  Complains of bilateral ankle edema, worsening tremors, and cough.    Date Cr K TSH LFTs Hgb  6/17    3.25 6    12/18 1.6 3.8 3.24 4 10.9  6/19 1.8 4.4 3.65    8/20 1.76  2.63    3/21 1.98 4.3 4.09 13 12.8  8/22 1.54 4.5 4.77 14 9.3   DATE TEST EF    7/22 Echo  60-65 % LAE 3.7 LA volume 24  7/22 Stress  55-65 %          DATE PR interval QRSduration Dose  12//21  160 104 0  8/22 -- 120 50       Past Medical History:  Diagnosis Date   Allergic rhinitis    Anemia of renal disease 10/21/2011   Anemia, iron deficiency 10/21/2011   Cholelithiasis    Chronic diastolic heart failure (HCC)    Colon polyp    Constipation    Cough    2nd to reflux   Debility, unspecified    Dehydration    Depression    Diaphragm dysfunction    Right hemidiaphragm   Disorder of bone and cartilage, unspecified    Diverticulosis of colon    Dysphagia    GERD (gastroesophageal reflux disease) 02/08/2013   History of hyperkalemia in setting of spironolactone 09/2010   Hyperlipidemia    Hypertension    Hypopotassemia    Hypothyroidism 02/08/2013   Long term (current) use of anticoagulants    MDS (myelodysplastic syndrome), low grade ([place]) 10/21/2011   Obesity    Obesity hypoventilation syndrome (HCC)    OSA (obstructive sleep apnea)    Osteoarthrosis, unspecified whether generalized or localized, unspecified site    Other specified disease of white blood cells    Pacemaker    Implanted December 2012   Paroxysmal A-fib ([place]) with RVR  Amiodarone   Reflux esophagitis    Rosacea 05/15/2013   Senile cataract    Sinus node dysfunction (HCC)    Tracheobronchomalacia    Unspecified vitamin D deficiency    Urinary incontinence    Xerostomia     Past Surgical History:  Procedure Laterality Date   CARDIOVERSION N/A 12/17/2019   Procedure: CARDIOVERSION;  Surgeon: Skeet Latch, MD;  Location: [place];  Service: Cardiovascular;  Laterality: N/A;   CARDIOVERSION N/A 04/21/2021   Procedure: CARDIOVERSION;  Surgeon: Jerline Pain, MD;  Location: [place];  Service: Cardiovascular;  Laterality: N/A;   CARDIOVERSION N/A 04/26/2021   Procedure: CARDIOVERSION;  Surgeon: Pixie Casino, MD;  Location: [facility] ENDOSCOPY;  Service: Cardiovascular;   Laterality: N/A;   CARDIOVERSION N/A 05/13/2021   Procedure: CARDIOVERSION;  Surgeon: Josue Hector, MD;  Location: [facility] ENDOSCOPY;  Service: Cardiovascular;  Laterality: N/A;   CATARACT EXTRACTION  12-31-2006   PACEMAKER PLACEMENT  10/21/2010   STJ, dural chamber Dr. [name]   TOTAL KNEE ARTHROPLASTY  1999   right knee   TOTAL KNEE ARTHROPLASTY  12-30-2001   left knee   TUBAL LIGATION  12/31/1962   Tummy tuck  12/31/1998   pt "almost died"; respiratory distress, became delrious    Current Outpatient Medications  Medication Sig Dispense Refill   acetaminophen (TYLENOL) 325 MG tablet Take 325-650 mg by mouth every 4 (four) hours as needed for moderate pain.     allopurinol (ZYLOPRIM) 100 MG tablet Take 100 mg by mouth in the morning.     bisacodyl (DULCOLAX) 10 MG suppository Place 10 mg rectally as needed for moderate constipation.     dextromethorphan-guaiFENesin (MUCINEX DM) 30-600 MG 12hr tablet Take 1 tablet by mouth 2 (two) times daily as needed (cough / congestion).     diltiazem (CARDIZEM SR) 60 MG 12 hr capsule Take 1 capsule (60 mg total) by mouth 2 (two) times daily. 60 capsule 0   ezetimibe (ZETIA) 10 MG tablet Take 10 mg by mouth every evening.      flecainide (TAMBOCOR) 50 MG tablet Take 1 tablet (50 mg total) by mouth every 12 (twelve) hours. 60 tablet 0   fluticasone (FLONASE) 50 MCG/ACT nasal spray Place 2 sprays into the nose 2 (two) times daily. 2 sprays in each nostril     ipratropium-albuterol (DUONEB) 0.5-2.5 (3) MG/3ML SOLN Take 3 mLs by nebulization every 6 (six) hours as needed (Shortness of Breath/Wheezing).     levothyroxine (SYNTHROID, LEVOTHROID) 75 MCG tablet Take 75 mcg by mouth daily before breakfast.      metoprolol succinate (TOPROL XL) 25 MG 24 hr tablet Take 1 tablet (25 mg total) by mouth at bedtime. 30 tablet 6   mineral oil-hydrophilic petrolatum (AQUAPHOR) ointment Apply 1 application topically as needed for dry skin (apply to affected area(s) of left forehead).      polyethylene glycol powder (GLYCOLAX/MIRALAX) 17 GM/SCOOP powder Take 17 g by mouth daily as needed for mild constipation.     potassium chloride SA (K-DUR,KLOR-CON) 20 MEQ tablet Take 40 mEq by mouth 2 (two) times daily.     Rivaroxaban (XARELTO) 15 MG TABS tablet Take 1 tablet (15 mg total) by mouth daily with supper. 30 tablet    sertraline (ZOLOFT) 50 MG tablet Take 1 tablet (50 mg total) by mouth daily. 30 tablet 3   torsemide (DEMADEX) 20 MG tablet Take 20 mg by mouth 2 (two) times daily.     No current facility-administered medications for this visit.  Allergies:   Codeine, Iron, Morphine, and Toviaz [fesoterodine fumarate]    ROS:   Review of Systems negative except from HPI and PMH  BP 120/62   Pulse 88   Ht '5\' 3"'$  (1.6 m)   Wt 211 lb 9.6 oz (96 kg)   SpO2 90%   BMI 37.48 kg/m  Well developed and well nourished in no acute distress HENT normal Neck supple  Left lung Clear, Diminished right lung sounds  Device pocket well healed; without hematoma or erythema.  There is no tethering  Irregularly irregular rhythm without gallop No murmur Abd-soft with active BS No Clubbing cyanosis 2+ edema Skin-warm and dry A & Oriented  Grossly normal sensory and motor function  ECG atrial fibrillation at 88 Intervals-/12/37       ASSESSMENT AND PLAN:  Pacemaker-[employer] Jude     Atrial fibrillation-persistent   HFpEF acute/chronic   Decreased breath sounds right lung-elevated right hemidiaphragm  PVCs-frequent (4.7%)   Tremor  Renal insufficiency grade 3  Interval recurrent atrial fibrillation despite repeat cardioversion most recently on low-dose flecainide.  We will increase her flecainide from 50--100 twice daily.  We discussed alternative antiarrhythmic strategies including dofetilide and resuming amiodarone previously discontinued because of tremors.  In the event at 100 twice daily does not hold her in sinus rhythm, For the other options and they will be considering  them in the interim.  We reviewed the potential benefits and risks of each.  She is significantly volume overloaded.  We will increase her Demadex from 20-40 twice daily.  We are a little bit about her renal insufficiency as her most recent creatinine was 1.54 but that for her is good.  Her anemia is chronic.  We will plan DC cardioversion in early follow-up    I,Mathew Stumpf,acting as a scribe for Virl Axe, MD.,have documented all relevant documentation on the behalf of Virl Axe, MD,as directed by  Virl Axe, MD while in the presence of Virl Axe, MD.  I, Virl Axe, MD, have reviewed all documentation for this visit. The documentation on 05/21/21 for the exam, diagnosis, procedures, and orders are all accurate and complete.   Virl Axe  05/21/2021 3:05 PM     [place] [address] [phone] (office)  [phone] (fax)

## 2021-05-21 NOTE — Patient Instructions (Signed)
Medication Instructions: Your physician has recommended you make the following change in your medication:   ** Increase Torsemide '20mg'$  to '40mg'$  (2 tablet) by mouth twice daily  ** Increase Flecainide to '100mg'$  - 1 tablet by mouth twice daily.  *If you need a refill on your cardiac medications before your next appointment, please call your pharmacy*   Lab Work: To be scheduled If you have labs (blood work) drawn today and your tests are completely normal, you will receive your results only by: [place] (if you have MyChart) OR A paper copy in the mail If you have any lab test that is abnormal or we need to change your treatment, we will call you to review the results.   Testing/Procedures: To be scheduled Your physician has recommended that you have a Cardioversion (DCCV). Electrical Cardioversion uses a jolt of electricity to your heart either through paddles or wired patches attached to your chest. This is a controlled, usually prescheduled, procedure. Defibrillation is done under light anesthesia in the hospital, and you usually go home the day of the procedure. This is done to get your heart back into a normal rhythm. You are not awake for the procedure. Please see the instruction sheet given to you today.   Follow-Up: At [facility], you and your health needs are our priority.  As part of our continuing mission to provide you with exceptional heart care, we have created designated Provider Care Teams.  These Care Teams include your primary Cardiologist (physician) and Advanced Practice Providers (APPs -  Physician Assistants and Nurse Practitioners) who all work together to provide you with the care you need, when you need it.  We recommend signing up for the patient portal called "MyChart".  Sign up information is provided on this After Visit Summary.  MyChart is used to connect with patients for Virtual Visits (Telemedicine).  Patients are able to view lab/test results,  encounter notes, upcoming appointments, etc.  Non-urgent messages can be sent to your provider as well.   To learn more about what you can do with MyChart, go to [email or web address].    Your next appointment:   To be scheduled

## 2021-05-21 NOTE — Progress Notes (Signed)
Cardiology Office Note Date:  05/21/2021  Patient ID:  Cindy Cervantes, Cindy Cervantes 10/08/1931, MRN [id number] PCP:  Virgie Dad, MD  Electrophysiology: Dr. [name] [facility] Chesley Mires, MD as Consulting Physician (Pulmonary Disease) Volanda Napoleon, MD as Consulting Physician (Oncology) Trixie Rude., MD as Consulting Physician (Ophthalmology)   Chief Complaint:  Routine EP, pacer visit  History of Present Illness: Cindy Cervantes is a 87 y.o. female  Seen in followup for her paroxysmal atrial fibrillation with a rapid ventricular response and sinus node dysfunction for which she required [facility] Jude pacemaker implantation in the fall of 2012. Device approaching ERI   Admitted 7/22 w chest pain and shortness of breath.  Found to be in atrial flutter and underwent cardioversion.  Developed ERAF underwent repeat cardioversion and was begun on flecainide previously failed amiodarone secondary to tremors Using home O2 2/2     Recurrent atrial fib DCCV 8/22 with reversion to AFib shortly thereafter overall, she has felt crummy for about 2 or 3 months, and this correlates with the onset of atrial fibrillation in mid June.  A few months ago she began to feel ill and fatigued constantly. At the time she was not physically active and mostly sat in her chair.     Also, she is having difficulty with her eyes. She endorses double vision. Testing in clinic today indicates double vision on right lateral gaze. She denies having headaches.  She does not take iron supplements due to constipation issues.  The patient denies chest pain, nocturnal dyspnea, orthopnea.  There have been no palpitations, lightheadedness or syncope.  Complains of bilateral ankle edema, worsening tremors, and cough.    Date Cr K TSH LFTs Hgb  6/17    3.25 6    12/18 1.6 3.8 3.24 4 10.9  6/19 1.8 4.4 3.65    8/20 1.76  2.63    3/21 1.98 4.3 4.09 13 12.8  8/22 1.54 4.5 4.77 14 9.3   DATE TEST EF    7/22 Echo  60-65 % LAE 3.7 LA volume 24  7/22 Stress  55-65 %          DATE PR interval QRSduration Dose  12//21  160 104 0  8/22 -- 120 50       Past Medical History:  Diagnosis Date   Allergic rhinitis    Anemia of renal disease 10/21/2011   Anemia, iron deficiency 10/21/2011   Cholelithiasis    Chronic diastolic heart failure (HCC)    Colon polyp    Constipation    Cough    2nd to reflux   Debility, unspecified    Dehydration    Depression    Diaphragm dysfunction    Right hemidiaphragm   Disorder of bone and cartilage, unspecified    Diverticulosis of colon    Dysphagia    GERD (gastroesophageal reflux disease) 02/08/2013   History of hyperkalemia in setting of spironolactone 09/2010   Hyperlipidemia    Hypertension    Hypopotassemia    Hypothyroidism 02/08/2013   Long term (current) use of anticoagulants    MDS (myelodysplastic syndrome), low grade ([place]) 10/21/2011   Obesity    Obesity hypoventilation syndrome (HCC)    OSA (obstructive sleep apnea)    Osteoarthrosis, unspecified whether generalized or localized, unspecified site    Other specified disease of white blood cells    Pacemaker    Implanted December 2012   Paroxysmal A-fib ([place]) with RVR  Amiodarone   Reflux esophagitis    Rosacea 05/15/2013   Senile cataract    Sinus node dysfunction (HCC)    Tracheobronchomalacia    Unspecified vitamin D deficiency    Urinary incontinence    Xerostomia     Past Surgical History:  Procedure Laterality Date   CARDIOVERSION N/A 12/17/2019   Procedure: CARDIOVERSION;  Surgeon: Skeet Latch, MD;  Location: [place];  Service: Cardiovascular;  Laterality: N/A;   CARDIOVERSION N/A 04/21/2021   Procedure: CARDIOVERSION;  Surgeon: Jerline Pain, MD;  Location: [place];  Service: Cardiovascular;  Laterality: N/A;   CARDIOVERSION N/A 04/26/2021   Procedure: CARDIOVERSION;  Surgeon: Pixie Casino, MD;  Location: [facility] ENDOSCOPY;  Service: Cardiovascular;   Laterality: N/A;   CARDIOVERSION N/A 05/13/2021   Procedure: CARDIOVERSION;  Surgeon: Josue Hector, MD;  Location: [facility] ENDOSCOPY;  Service: Cardiovascular;  Laterality: N/A;   CATARACT EXTRACTION  09-Jan-2007   PACEMAKER PLACEMENT  10/21/2010   STJ, dural chamber Dr. [name]   TOTAL KNEE ARTHROPLASTY  1999   right knee   TOTAL KNEE ARTHROPLASTY  01-08-2002   left knee   TUBAL LIGATION  01-09-63   Tummy tuck  1999-01-09   pt "almost died"; respiratory distress, became delrious    Current Outpatient Medications  Medication Sig Dispense Refill   acetaminophen (TYLENOL) 325 MG tablet Take 325-650 mg by mouth every 4 (four) hours as needed for moderate pain.     allopurinol (ZYLOPRIM) 100 MG tablet Take 100 mg by mouth in the morning.     bisacodyl (DULCOLAX) 10 MG suppository Place 10 mg rectally as needed for moderate constipation.     dextromethorphan-guaiFENesin (MUCINEX DM) 30-600 MG 12hr tablet Take 1 tablet by mouth 2 (two) times daily as needed (cough / congestion).     diltiazem (CARDIZEM SR) 60 MG 12 hr capsule Take 1 capsule (60 mg total) by mouth 2 (two) times daily. 60 capsule 0   ezetimibe (ZETIA) 10 MG tablet Take 10 mg by mouth every evening.      flecainide (TAMBOCOR) 50 MG tablet Take 1 tablet (50 mg total) by mouth every 12 (twelve) hours. 60 tablet 0   fluticasone (FLONASE) 50 MCG/ACT nasal spray Place 2 sprays into the nose 2 (two) times daily. 2 sprays in each nostril     ipratropium-albuterol (DUONEB) 0.5-2.5 (3) MG/3ML SOLN Take 3 mLs by nebulization every 6 (six) hours as needed (Shortness of Breath/Wheezing).     levothyroxine (SYNTHROID, LEVOTHROID) 75 MCG tablet Take 75 mcg by mouth daily before breakfast.      metoprolol succinate (TOPROL XL) 25 MG 24 hr tablet Take 1 tablet (25 mg total) by mouth at bedtime. 30 tablet 6   mineral oil-hydrophilic petrolatum (AQUAPHOR) ointment Apply 1 application topically as needed for dry skin (apply to affected area(s) of left forehead).      polyethylene glycol powder (GLYCOLAX/MIRALAX) 17 GM/SCOOP powder Take 17 g by mouth daily as needed for mild constipation.     potassium chloride SA (K-DUR,KLOR-CON) 20 MEQ tablet Take 40 mEq by mouth 2 (two) times daily.     Rivaroxaban (XARELTO) 15 MG TABS tablet Take 1 tablet (15 mg total) by mouth daily with supper. 30 tablet    sertraline (ZOLOFT) 50 MG tablet Take 1 tablet (50 mg total) by mouth daily. 30 tablet 3   torsemide (DEMADEX) 20 MG tablet Take 20 mg by mouth 2 (two) times daily.     No current facility-administered medications for this visit.  Allergies:   Codeine, Iron, Morphine, and Toviaz [fesoterodine fumarate]    ROS:   Review of Systems negative except from HPI and PMH  BP 120/62   Pulse 88   Ht '5\' 3"'$  (1.6 m)   Wt 211 lb 9.6 oz (96 kg)   SpO2 90%   BMI 37.48 kg/m  Well developed and well nourished in no acute distress HENT normal Neck supple  Left lung Clear, Diminished right lung sounds  Device pocket well healed; without hematoma or erythema.  There is no tethering  Irregularly irregular rhythm without gallop No murmur Abd-soft with active BS No Clubbing cyanosis 2+ edema Skin-warm and dry A & Oriented  Grossly normal sensory and motor function  ECG atrial fibrillation at 88 Intervals-/12/37       ASSESSMENT AND PLAN:  Pacemaker-[employer] Jude     Atrial fibrillation-persistent   HFpEF acute/chronic   Decreased breath sounds right lung-elevated right hemidiaphragm  PVCs-frequent (4.7%)   Tremor  Renal insufficiency grade 3  Interval recurrent atrial fibrillation despite repeat cardioversion most recently on low-dose flecainide.  We will increase her flecainide from 50--100 twice daily.  We discussed alternative antiarrhythmic strategies including dofetilide and resuming amiodarone previously discontinued because of tremors.  In the event at 100 twice daily does not hold her in sinus rhythm, For the other options and they will be considering  them in the interim.  We reviewed the potential benefits and risks of each.  She is significantly volume overloaded.  We will increase her Demadex from 20-40 twice daily.  We are a little bit about her renal insufficiency as her most recent creatinine was 1.54 but that for her is good.  Her anemia is chronic.  We will plan DC cardioversion in early follow-up    I,Mathew Stumpf,acting as a scribe for Virl Axe, MD.,have documented all relevant documentation on the behalf of Virl Axe, MD,as directed by  Virl Axe, MD while in the presence of Virl Axe, MD.  I, Virl Axe, MD, have reviewed all documentation for this visit. The documentation on 05/21/21 for the exam, diagnosis, procedures, and orders are all accurate and complete.   Virl Axe  05/21/2021 3:05 PM     [place] [address] [phone] (office)  [phone] (fax)

## 2021-05-23 DIAGNOSIS — J9811 Atelectasis: Secondary | ICD-10-CM | POA: Diagnosis not present

## 2021-05-23 DIAGNOSIS — R0602 Shortness of breath: Secondary | ICD-10-CM | POA: Diagnosis not present

## 2021-05-23 DIAGNOSIS — I480 Paroxysmal atrial fibrillation: Secondary | ICD-10-CM | POA: Diagnosis not present

## 2021-05-23 DIAGNOSIS — R2689 Other abnormalities of gait and mobility: Secondary | ICD-10-CM | POA: Diagnosis not present

## 2021-05-23 DIAGNOSIS — R5383 Other fatigue: Secondary | ICD-10-CM | POA: Diagnosis not present

## 2021-05-23 DIAGNOSIS — R2681 Unsteadiness on feet: Secondary | ICD-10-CM | POA: Diagnosis not present

## 2021-05-24 ENCOUNTER — Non-Acute Institutional Stay (SKILLED_NURSING_FACILITY): Payer: Medicare Other | Admitting: Internal Medicine

## 2021-05-24 ENCOUNTER — Encounter: Payer: Self-pay | Admitting: Internal Medicine

## 2021-05-24 DIAGNOSIS — I5032 Chronic diastolic (congestive) heart failure: Secondary | ICD-10-CM

## 2021-05-24 DIAGNOSIS — I495 Sick sinus syndrome: Secondary | ICD-10-CM

## 2021-05-24 DIAGNOSIS — D638 Anemia in other chronic diseases classified elsewhere: Secondary | ICD-10-CM

## 2021-05-24 DIAGNOSIS — M1A9XX0 Chronic gout, unspecified, without tophus (tophi): Secondary | ICD-10-CM | POA: Diagnosis not present

## 2021-05-24 DIAGNOSIS — I4892 Unspecified atrial flutter: Secondary | ICD-10-CM | POA: Diagnosis not present

## 2021-05-24 DIAGNOSIS — E039 Hypothyroidism, unspecified: Secondary | ICD-10-CM | POA: Diagnosis not present

## 2021-05-24 DIAGNOSIS — D508 Other iron deficiency anemias: Secondary | ICD-10-CM | POA: Diagnosis not present

## 2021-05-24 DIAGNOSIS — F321 Major depressive disorder, single episode, moderate: Secondary | ICD-10-CM | POA: Diagnosis not present

## 2021-05-24 DIAGNOSIS — J9811 Atelectasis: Secondary | ICD-10-CM | POA: Diagnosis not present

## 2021-05-24 DIAGNOSIS — I480 Paroxysmal atrial fibrillation: Secondary | ICD-10-CM | POA: Diagnosis not present

## 2021-05-24 DIAGNOSIS — R5383 Other fatigue: Secondary | ICD-10-CM | POA: Diagnosis not present

## 2021-05-24 DIAGNOSIS — N1832 Chronic kidney disease, stage 3b: Secondary | ICD-10-CM | POA: Diagnosis not present

## 2021-05-24 DIAGNOSIS — R0602 Shortness of breath: Secondary | ICD-10-CM | POA: Diagnosis not present

## 2021-05-24 DIAGNOSIS — R2681 Unsteadiness on feet: Secondary | ICD-10-CM | POA: Diagnosis not present

## 2021-05-24 DIAGNOSIS — R278 Other lack of coordination: Secondary | ICD-10-CM | POA: Diagnosis not present

## 2021-05-24 DIAGNOSIS — G250 Essential tremor: Secondary | ICD-10-CM

## 2021-05-24 DIAGNOSIS — D513 Other dietary vitamin B12 deficiency anemia: Secondary | ICD-10-CM | POA: Diagnosis not present

## 2021-05-24 DIAGNOSIS — M6389 Disorders of muscle in diseases classified elsewhere, multiple sites: Secondary | ICD-10-CM | POA: Diagnosis not present

## 2021-05-24 DIAGNOSIS — R2689 Other abnormalities of gait and mobility: Secondary | ICD-10-CM | POA: Diagnosis not present

## 2021-05-24 NOTE — Progress Notes (Signed)
Location: [facility] Room Number: 155 Place of Service:  SNF (31)  Provider:   Code Status:  Goals of Care:  Advanced Directives 05/24/2021  Does Patient Have a Medical Advance Directive? Yes  Type of Paramedic of [place];Out of facility DNR (pink MOST or yellow form)  Does patient want to make changes to medical advance directive? No - Patient declined  Copy of [place] in Chart? Yes - validated most recent copy scanned in chart (See row information)  Pre-existing out of facility DNR order (yellow form or pink MOST form) Yellow form placed in chart (order not valid for inpatient use)     Chief Complaint  Patient presents with   Acute Visit    HPI: Patient is a 87 y.o. female seen today for an acute visit for Follow up of Her SOB  Patient has h/o Diastolic CHF Echo in [id number] showed EF of 60%, H/o Atrial Flutter has been through 2 cardioversion 04/26/21 and 05/13/21 SSS s/p Pacemaker Hypothyroid CKD HLD  OSA Uses CPAP Chronic Hypoxia on Oxygen   Moved to SNF as unable to do her ADLS in her apartment due to SOB especially with Exertion Increased Demadex and now per cardiology she is on higher doses Also Flecainide dose was increased  Has lost weight but just 2 lbs Still has SOB and also feels very tired and fatigued Working with therapy. Cough present  Past Medical History:  Diagnosis Date   Allergic rhinitis    Anemia of renal disease 10/21/2011   Anemia, iron deficiency 10/21/2011   Cholelithiasis    Chronic diastolic heart failure (HCC)    Colon polyp    Constipation    Cough    2nd to reflux   Debility, unspecified    Dehydration    Depression    Diaphragm dysfunction    Right hemidiaphragm   Disorder of bone and cartilage, unspecified    Diverticulosis of colon    Dysphagia    GERD (gastroesophageal reflux disease) 02/08/2013   History of hyperkalemia in setting of spironolactone  09/2010   Hyperlipidemia    Hypertension    Hypopotassemia    Hypothyroidism 02/08/2013   Long term (current) use of anticoagulants    MDS (myelodysplastic syndrome), low grade ([place]) 10/21/2011   Obesity    Obesity hypoventilation syndrome (HCC)    OSA (obstructive sleep apnea)    Osteoarthrosis, unspecified whether generalized or localized, unspecified site    Other specified disease of white blood cells    Pacemaker    Implanted December 2012   Paroxysmal A-fib [facility]) with RVR   Amiodarone   Reflux esophagitis    Rosacea 05/15/2013   Senile cataract    Sinus node dysfunction ([place])    Tracheobronchomalacia    Unspecified vitamin D deficiency    Urinary incontinence    Xerostomia     Past Surgical History:  Procedure Laterality Date   CARDIOVERSION N/A 12/17/2019   Procedure: CARDIOVERSION;  Surgeon: Skeet Latch, MD;  Location: [facility];  Service: Cardiovascular;  Laterality: N/A;   CARDIOVERSION N/A 04/21/2021   Procedure: CARDIOVERSION;  Surgeon: Jerline Pain, MD;  Location: [facility];  Service: Cardiovascular;  Laterality: N/A;   CARDIOVERSION N/A 04/26/2021   Procedure: CARDIOVERSION;  Surgeon: Pixie Casino, MD;  Location: [facility];  Service: Cardiovascular;  Laterality: N/A;   CARDIOVERSION N/A 05/13/2021   Procedure: CARDIOVERSION;  Surgeon: Josue Hector, MD;  Location: [facility];  Service: Cardiovascular;  Laterality: N/A;   CATARACT EXTRACTION  16-Jan-2007   PACEMAKER PLACEMENT  10/21/2010   STJ, dural chamber Dr. [name]   TOTAL KNEE ARTHROPLASTY  1999   right knee   TOTAL KNEE ARTHROPLASTY  01/15/02   left knee   TUBAL LIGATION  01/16/63   Tummy tuck  January 16, 1999   pt "almost died"; respiratory distress, became delrious    Allergies  Allergen Reactions   Codeine     Extreme lethargy   Iron     By infusion - back spasms    Morphine     Hallucinations, altered mental state   Toviaz [Fesoterodine Fumarate]     hyperactive    Outpatient Encounter  Medications as of 05/24/2021  Medication Sig   acetaminophen (TYLENOL) 325 MG tablet Take 325-650 mg by mouth every 4 (four) hours as needed for moderate pain.   allopurinol (ZYLOPRIM) 100 MG tablet Take 100 mg by mouth in the morning.   bisacodyl (DULCOLAX) 10 MG suppository Place 10 mg rectally as needed for moderate constipation.   dextromethorphan-guaiFENesin (MUCINEX DM) 30-600 MG 12hr tablet Take 1 tablet by mouth 2 (two) times daily as needed (cough / congestion).   diltiazem (CARDIZEM SR) 60 MG 12 hr capsule Take 1 capsule (60 mg total) by mouth 2 (two) times daily.   ezetimibe (ZETIA) 10 MG tablet Take 10 mg by mouth every evening.    flecainide (TAMBOCOR) 100 MG tablet Take 1 tablet (100 mg total) by mouth 2 (two) times daily.   fluticasone (FLONASE) 50 MCG/ACT nasal spray Place 2 sprays into the nose 2 (two) times daily. 2 sprays in each nostril   ipratropium-albuterol (DUONEB) 0.5-2.5 (3) MG/3ML SOLN Take 3 mLs by nebulization every 6 (six) hours as needed (Shortness of Breath/Wheezing).   levothyroxine (SYNTHROID, LEVOTHROID) 75 MCG tablet Take 75 mcg by mouth daily before breakfast.    magnesium hydroxide (MILK OF MAGNESIA) 400 MG/5ML suspension Take by mouth daily as needed for mild constipation.   metoprolol succinate (TOPROL XL) 25 MG 24 hr tablet Take 1 tablet (25 mg total) by mouth at bedtime.   mineral oil-hydrophilic petrolatum (AQUAPHOR) ointment Apply 1 application topically as needed for dry skin (apply to affected area(s) of left forehead).   polyethylene glycol powder (GLYCOLAX/MIRALAX) 17 GM/SCOOP powder Take 17 g by mouth daily as needed for mild constipation.   potassium chloride SA (K-DUR,KLOR-CON) 20 MEQ tablet Take 40 mEq by mouth 2 (two) times daily.   Rivaroxaban (XARELTO) 15 MG TABS tablet Take 1 tablet (15 mg total) by mouth daily with supper.   sertraline (ZOLOFT) 50 MG tablet Take 1 tablet (50 mg total) by mouth daily.   torsemide (DEMADEX) 20 MG tablet Take 2  tablets (40 mg total) by mouth 2 (two) times daily.   No facility-administered encounter medications on file as of 05/24/2021.    Review of Systems:  Review of Systems  Constitutional:  Positive for activity change and appetite change.  HENT: Negative.    Respiratory:  Positive for cough and shortness of breath.   Cardiovascular:  Positive for leg swelling.  Gastrointestinal: Negative.   Genitourinary: Negative.   Musculoskeletal:  Positive for gait problem.  Neurological:  Positive for weakness. Negative for dizziness.  Psychiatric/Behavioral: Negative.     Health Maintenance  Topic Date Due   COVID-19 Vaccine (4 - Booster) 11/17/2020   INFLUENZA VACCINE  05/03/2021   TETANUS/TDAP  07/07/2025   DEXA SCAN  Completed   PNA vac Low  Risk Adult  Completed   Zoster Vaccines- Shingrix  Completed   HPV VACCINES  Aged Out    Physical Exam: Vitals:   05/24/21 0910  BP: 117/74  Pulse: 80  Resp: 20  Temp: 98.2 F (36.8 C)  SpO2: 94%  Weight: 209 lb 3.2 oz (94.9 kg)  Height: '5\' 4"'$  (1.626 m)   Body mass index is 35.91 kg/m. Physical Exam Constitutional: Oriented to person, place, and time. Well-developed and well-nourished.  HENT:  Head: Normocephalic.  Mouth/Throat: Oropharynx is clear and moist.  Eyes: Pupils are equal, round, and reactive to light.  Neck: Neck supple.  Cardiovascular: Normal rate and normal heart sounds.   Pulmonary/Chest: Effort normal and breath sounds normal. No respiratory distress.  She has Expiratory Wheezing Abdominal: Soft. Bowel sounds are normal. No distension. There is no tenderness. There is no rebound.  Musculoskeletal: Moderate edema BIlateral Lymphadenopathy: none Neurological: Alert and oriented to person, place, and time.  Skin: Skin is warm and dry.  Psychiatric: Normal mood and affect. Behavior is normal. Thought content normal.   Labs reviewed: Basic Metabolic Panel: Recent Labs    04/20/21 1915 04/21/21 0144 04/25/21 0453  04/26/21 0210 05/03/21 0000 05/10/21 0926 05/12/21 0000  NA  --    < > 139 141 147 141 146  K  --    < > 3.4* 3.6 4.9 4.2 4.5  CL  --    < > 97* 97* 100 98 95*  CO2  --    < > 33* 38* 26* 40* 30*  GLUCOSE  --    < > 103* 101*  --  120*  --   BUN  --    < > '14 14 17 26 '$ 26*  CREATININE  --    < > 1.39* 1.51* 1.6* 1.49* 1.5*  CALCIUM  --    < > 8.5* 8.6* 8.9 8.5* 8.8  MG  --   --  2.0 2.0 2.4 2.3  --   TSH 2.546  --   --   --  4.77  --   --    < > = values in this interval not displayed.   Liver Function Tests: Recent Labs    10/05/20 0000 04/20/21 0135 05/03/21 0000  AST  --  23 13  ALT  --  10 14  ALKPHOS  --  80 103  BILITOT  --  1.0  --   PROT  --  6.1*  --   ALBUMIN 3.4* 3.0* 3.6   No results for input(s): LIPASE, AMYLASE in the last 8760 hours. No results for input(s): AMMONIA in the last 8760 hours. CBC: Recent Labs    04/20/21 0135 04/20/21 1011 04/25/21 0453 04/26/21 0210 05/03/21 0000 05/10/21 0926  WBC 9.6   < > 7.6 8.0 7.7 9.8  NEUTROABS 7.4  --   --   --   --   --   HGB 10.5*   < > 10.3* 10.8* 10.1* 9.3*  HCT 35.5*   < > 33.1* 35.7* 32* 29.6*  MCV 105.7*   < > 102.2* 104.1*  --  97  PLT 188   < > 154 176 159 201   < > = values in this interval not displayed.   Lipid Panel: Recent Labs    10/05/20 0000 05/03/21 0000  CHOL 168 155  HDL 46 48  LDLCALC 105 98  TRIG 86 77   Lab Results  Component Value Date   HGBA1C 5.5 09/06/2016  Procedures since last visit: CUP Filer  Result Date: 05/10/2021 Pacemaker check in clinic. Normal device function. Thresholds, sensing, impedances consistent with previous measurements. Device programmed to maximize longevity. Pt back in rate controlled AF since 04/28/21, 2 days after cardioversion. Device programmed at appropriate safety margins. Histogram distribution appropriate for patient activity level.  Estimated longevity <3 mo (Battery voltage at 2.62; ERI at 2.60). Patient enrolled in  remote follow-up/TTM's with [employer]. Patient education completed.  CUP PACEART REMOTE DEVICE CHECK  Result Date: 05/19/2021 Scheduled remote reviewed. Normal device function.  4 AMS, flutter with CVR, burden 21% Ongoing event since 8/14 @ 14:07 Next remote 9/12 LR   Assessment/Plan Chronic diastolic heart failure (HCC) Higher dose of Demadex now Will Repeat BMP in 1 week Atrovent Nebs BID for 3 days for her wheezing Trying to avoid Albuterol due to her A Fib FR 1500 cc 24 hours  Atrial flutter, unspecified type [facility]) Cardioversion Planned per Cardiology Also on higher dose of Flecainide Also on Cardizem and Lopressor  Acquired hypothyroidism TSH normal in 8/22 Stage 3b chronic kidney disease ([place]) Will Follow closely due to higher dose of Diuretics SICK SINUS SYNDROME Has pacemaker Anemia of chronic disease Most Likely due to Chronic Disease Depression, major, single episode, moderate (HCC) On Zoloft Benign essential tremor On Metoprolol Chronic gout without tophus, unspecified cause, unspecified site Continue Allopurinol Labs/tests ordered:  * No order type specified * Next appt:  08/04/2021

## 2021-05-25 DIAGNOSIS — R5383 Other fatigue: Secondary | ICD-10-CM | POA: Diagnosis not present

## 2021-05-25 DIAGNOSIS — M6389 Disorders of muscle in diseases classified elsewhere, multiple sites: Secondary | ICD-10-CM | POA: Diagnosis not present

## 2021-05-25 DIAGNOSIS — I480 Paroxysmal atrial fibrillation: Secondary | ICD-10-CM | POA: Diagnosis not present

## 2021-05-25 DIAGNOSIS — R0602 Shortness of breath: Secondary | ICD-10-CM | POA: Diagnosis not present

## 2021-05-25 DIAGNOSIS — R2689 Other abnormalities of gait and mobility: Secondary | ICD-10-CM | POA: Diagnosis not present

## 2021-05-25 DIAGNOSIS — R278 Other lack of coordination: Secondary | ICD-10-CM | POA: Diagnosis not present

## 2021-05-25 DIAGNOSIS — R2681 Unsteadiness on feet: Secondary | ICD-10-CM | POA: Diagnosis not present

## 2021-05-25 DIAGNOSIS — J9811 Atelectasis: Secondary | ICD-10-CM | POA: Diagnosis not present

## 2021-05-26 ENCOUNTER — Non-Acute Institutional Stay (SKILLED_NURSING_FACILITY): Payer: Medicare Other | Admitting: Orthopedic Surgery

## 2021-05-26 ENCOUNTER — Encounter: Payer: Self-pay | Admitting: Internal Medicine

## 2021-05-26 DIAGNOSIS — R0602 Shortness of breath: Secondary | ICD-10-CM | POA: Diagnosis not present

## 2021-05-26 DIAGNOSIS — I5032 Chronic diastolic (congestive) heart failure: Secondary | ICD-10-CM | POA: Diagnosis not present

## 2021-05-26 DIAGNOSIS — I480 Paroxysmal atrial fibrillation: Secondary | ICD-10-CM | POA: Diagnosis not present

## 2021-05-26 DIAGNOSIS — Z66 Do not resuscitate: Secondary | ICD-10-CM | POA: Diagnosis not present

## 2021-05-26 DIAGNOSIS — R600 Localized edema: Secondary | ICD-10-CM

## 2021-05-26 DIAGNOSIS — M6389 Disorders of muscle in diseases classified elsewhere, multiple sites: Secondary | ICD-10-CM | POA: Diagnosis not present

## 2021-05-26 DIAGNOSIS — R278 Other lack of coordination: Secondary | ICD-10-CM | POA: Diagnosis not present

## 2021-05-26 DIAGNOSIS — J9811 Atelectasis: Secondary | ICD-10-CM | POA: Diagnosis not present

## 2021-05-26 NOTE — Telephone Encounter (Signed)
Spoke with pt's daughter, Magda Paganini, [place] and reviewed DCCV instructions.  Pt's daughter states she did receive instructions on MyChart and has printed a copy to send to [facility]. Pt's daughter has no questions and verbalizes understanding.

## 2021-05-27 ENCOUNTER — Other Ambulatory Visit: Payer: Self-pay | Admitting: Internal Medicine

## 2021-05-27 DIAGNOSIS — R0602 Shortness of breath: Secondary | ICD-10-CM | POA: Diagnosis not present

## 2021-05-27 DIAGNOSIS — R2681 Unsteadiness on feet: Secondary | ICD-10-CM | POA: Diagnosis not present

## 2021-05-27 DIAGNOSIS — M6389 Disorders of muscle in diseases classified elsewhere, multiple sites: Secondary | ICD-10-CM | POA: Diagnosis not present

## 2021-05-27 DIAGNOSIS — I480 Paroxysmal atrial fibrillation: Secondary | ICD-10-CM | POA: Diagnosis not present

## 2021-05-27 DIAGNOSIS — R5383 Other fatigue: Secondary | ICD-10-CM | POA: Diagnosis not present

## 2021-05-27 DIAGNOSIS — R2689 Other abnormalities of gait and mobility: Secondary | ICD-10-CM | POA: Diagnosis not present

## 2021-05-27 DIAGNOSIS — R278 Other lack of coordination: Secondary | ICD-10-CM | POA: Diagnosis not present

## 2021-05-27 DIAGNOSIS — J9811 Atelectasis: Secondary | ICD-10-CM | POA: Diagnosis not present

## 2021-05-27 NOTE — Anesthesia Preprocedure Evaluation (Addendum)
Anesthesia Evaluation  Patient identified by MRN, date of birth, ID band Patient awake    Reviewed: Allergy & Precautions, NPO status , Patient's Chart, lab work & pertinent test results  Airway Mallampati: II  TM Distance: >3 FB Neck ROM: Full    Dental no notable dental hx. (+) Missing, Dental Advisory Given,    Pulmonary sleep apnea and Continuous Positive Airway Pressure Ventilation , former smoker,    Pulmonary exam normal breath sounds clear to auscultation       Cardiovascular hypertension, Pt. on home beta blockers and Pt. on medications Normal cardiovascular exam+ dysrhythmias Atrial Fibrillation + pacemaker  Rhythm:Irregular Rate:Normal  04/20/21 Echo 1. Left ventricular ejection fraction, by estimation, is 60 to 65%. The  left ventricle has normal function. The left ventricle has no regional  wall motion abnormalities. Left ventricular diastolic function could not  be evaluated.  2. Right ventricular systolic function is normal. The right ventricular  size is normal. There is normal pulmonary artery systolic pressure.  3. The mitral valve is normal in structure. Trivial mitral valve  regurgitation.  4. The aortic valve is grossly normal. There is moderate calcification of  the aortic valve. There is moderate thickening of the aortic valve. Aortic  valve regurgitation is not visualized. Mild to moderate aortic valve  sclerosis/calcification is present,  without any evidence of aortic stenosis.  5. The inferior vena cava is normal in size with <50% respiratory  variability, suggesting right atrial pressure of 8 mmHg.    Neuro/Psych    GI/Hepatic GERD  ,  Endo/Other    Renal/GU Renal InsufficiencyRenal diseaseLab Results      Component                Value               Date                      CREATININE               1.5 (A)             05/12/2021                BUN                      26 (A)               05/12/2021                NA                       146                 05/12/2021                K                        4.5                 05/12/2021                CL                       95 (A)              05/12/2021  CO2                      30 (A)              05/12/2021                Musculoskeletal  (+) Arthritis ,   Abdominal (+) + obese (BMI 35.91),   Peds  Hematology  (+) anemia , Lab Results      Component                Value               Date                      WBC                      9.8                 05/10/2021                HGB                      9.3 (L)             05/10/2021                HCT                      29.6 (L)            05/10/2021                MCV                      97                  05/10/2021                PLT                      201                 05/10/2021              Anesthesia Other Findings   Reproductive/Obstetrics                            Anesthesia Physical Anesthesia Plan  ASA: 3  Anesthesia Plan: General   Post-op Pain Management:    Induction:   PONV Risk Score and Plan:   Airway Management Planned: Natural Airway  Additional Equipment: None  Intra-op Plan:   Post-operative Plan:   Informed Consent: I have reviewed the patients History and Physical, chart, labs and discussed the procedure including the risks, benefits and alternatives for the proposed anesthesia with the patient or authorized representative who has indicated his/her understanding and acceptance.     Dental advisory given  Plan Discussed with: CRNA and Anesthesiologist  Anesthesia Plan Comments:        Anesthesia Quick Evaluation

## 2021-05-28 ENCOUNTER — Encounter (HOSPITAL_COMMUNITY)
Admission: RE | Disposition: A | Discharge status: Home / Self Care | Payer: Self-pay | Source: Home / Self Care | Attending: Cardiovascular Disease

## 2021-05-28 ENCOUNTER — Encounter (HOSPITAL_COMMUNITY): Payer: Self-pay | Admitting: Cardiovascular Disease

## 2021-05-28 ENCOUNTER — Encounter: Payer: Self-pay | Admitting: Orthopedic Surgery

## 2021-05-28 ENCOUNTER — Ambulatory Visit (HOSPITAL_COMMUNITY)
Admission: RE | Admit: 2021-05-28 | Discharge: 2021-05-28 | Disposition: A | Discharge status: Home / Self Care | Payer: Medicare Other | Attending: Cardiovascular Disease | Admitting: Cardiovascular Disease

## 2021-05-28 ENCOUNTER — Ambulatory Visit (HOSPITAL_COMMUNITY): Payer: Medicare Other | Admitting: Anesthesiology

## 2021-05-28 ENCOUNTER — Other Ambulatory Visit: Payer: Self-pay

## 2021-05-28 DIAGNOSIS — Z79890 Hormone replacement therapy (postmenopausal): Secondary | ICD-10-CM | POA: Insufficient documentation

## 2021-05-28 DIAGNOSIS — I480 Paroxysmal atrial fibrillation: Secondary | ICD-10-CM | POA: Insufficient documentation

## 2021-05-28 DIAGNOSIS — Z96653 Presence of artificial knee joint, bilateral: Secondary | ICD-10-CM | POA: Diagnosis not present

## 2021-05-28 DIAGNOSIS — Z9989 Dependence on other enabling machines and devices: Secondary | ICD-10-CM | POA: Diagnosis not present

## 2021-05-28 DIAGNOSIS — I5030 Unspecified diastolic (congestive) heart failure: Secondary | ICD-10-CM | POA: Insufficient documentation

## 2021-05-28 DIAGNOSIS — N289 Disorder of kidney and ureter, unspecified: Secondary | ICD-10-CM | POA: Insufficient documentation

## 2021-05-28 DIAGNOSIS — I484 Atypical atrial flutter: Secondary | ICD-10-CM

## 2021-05-28 DIAGNOSIS — E559 Vitamin D deficiency, unspecified: Secondary | ICD-10-CM | POA: Diagnosis not present

## 2021-05-28 DIAGNOSIS — Z950 Presence of cardiac pacemaker: Secondary | ICD-10-CM | POA: Insufficient documentation

## 2021-05-28 DIAGNOSIS — I495 Sick sinus syndrome: Secondary | ICD-10-CM

## 2021-05-28 DIAGNOSIS — I110 Hypertensive heart disease with heart failure: Secondary | ICD-10-CM | POA: Insufficient documentation

## 2021-05-28 DIAGNOSIS — Z885 Allergy status to narcotic agent status: Secondary | ICD-10-CM | POA: Diagnosis not present

## 2021-05-28 DIAGNOSIS — Z7901 Long term (current) use of anticoagulants: Secondary | ICD-10-CM | POA: Insufficient documentation

## 2021-05-28 DIAGNOSIS — I4819 Other persistent atrial fibrillation: Secondary | ICD-10-CM | POA: Diagnosis not present

## 2021-05-28 DIAGNOSIS — Z79899 Other long term (current) drug therapy: Secondary | ICD-10-CM | POA: Diagnosis not present

## 2021-05-28 DIAGNOSIS — G4733 Obstructive sleep apnea (adult) (pediatric): Secondary | ICD-10-CM | POA: Diagnosis not present

## 2021-05-28 DIAGNOSIS — I4891 Unspecified atrial fibrillation: Secondary | ICD-10-CM

## 2021-05-28 HISTORY — PX: CARDIOVERSION: SHX1299

## 2021-05-28 LAB — POCT I-STAT, CHEM 8
BUN: 37 mg/dL — ABNORMAL HIGH (ref 8–23)
Calcium, Ion: 1.03 mmol/L — ABNORMAL LOW (ref 1.15–1.40)
Chloride: 95 mmol/L — ABNORMAL LOW (ref 98–111)
Creatinine, Ser: 1.8 mg/dL — ABNORMAL HIGH (ref 0.44–1.00)
Glucose, Bld: 119 mg/dL — ABNORMAL HIGH (ref 70–99)
HCT: 34 % — ABNORMAL LOW (ref 36.0–46.0)
Hemoglobin: 11.6 g/dL — ABNORMAL LOW (ref 12.0–15.0)
Potassium: 4.2 mmol/L (ref 3.5–5.1)
Sodium: 140 mmol/L (ref 135–145)
TCO2: 39 mmol/L — ABNORMAL HIGH (ref 22–32)

## 2021-05-28 SURGERY — CARDIOVERSION
Anesthesia: General

## 2021-05-28 MED ORDER — PROPOFOL 10 MG/ML IV BOLUS
INTRAVENOUS | Status: DC | PRN
  Administered 2021-05-28: 50 mg via INTRAVENOUS

## 2021-05-28 MED ORDER — SODIUM CHLORIDE 0.9 % IV SOLN: INTRAVENOUS | Status: DC | PRN

## 2021-05-28 MED ORDER — LIDOCAINE 2% (20 MG/ML) 5 ML SYRINGE
INTRAMUSCULAR | Status: DC | PRN
  Administered 2021-05-28: 60 mg via INTRAVENOUS

## 2021-05-28 NOTE — Transfer of Care (Signed)
Immediate Anesthesia Transfer of Care Note  Patient: Cindy Cervantes  Procedure(s) Performed: CARDIOVERSION  Patient Location: Endoscopy Unit  Anesthesia Type:General  Level of Consciousness: awake and drowsy  Airway & Oxygen Therapy: Patient Spontanous Breathing and Patient connected to nasal cannula oxygen  Post-op Assessment: Report given to RN and Post -op Vital signs reviewed and stable  Post vital signs: Reviewed and stable  Last Vitals:  Vitals Value Taken Time  BP    Temp    Pulse    Resp    SpO2      Last Pain:  Vitals:   05/28/21 0715  TempSrc: Temporal  PainSc: 0-No pain         Complications: No notable events documented.

## 2021-05-28 NOTE — Discharge Instructions (Signed)
Electrical Cardioversion  Electrical cardioversion is the delivery of a jolt of electricity to restore a normal rhythm to the heart. A rhythm that is too fast or is not regular keeps the heart from pumping well. In this procedure, sticky patches or metal paddles are placed on the chest to deliver electricity to the heart from a device.  What can I expect after the procedure?  Your blood pressure, heart rate, breathing rate, and blood oxygen level will be monitored until you leave the hospital or clinic.  Your heart rhythm will be watched to make sure it does not change.  You may have some redness on the skin where the shocks were given.If this occurs, can use hydrocortisone cream or Aloe vera.  Follow these instructions at home:  Do not drive for 24 hours if you were given a sedative during your procedure.  Take over-the-counter and prescription medicines only as told by your health care provider.  Ask your health care provider how to check your pulse. Check it often.  Rest for 48 hours after the procedure or as told by your health care provider.  Avoid or limit your caffeine use as told by your health care provider.  Keep all follow-up visits as told by your health care provider. This is important.  Contact a health care provider if:  You feel like your heart is beating too quickly or your pulse is not regular.  You have a serious muscle cramp that does not go away.  Get help right away if:  You have discomfort in your chest.  You are dizzy or you feel faint.  You have trouble breathing or you are short of breath.  Your speech is slurred.  You have trouble moving an arm or leg on one side of your body.  Your fingers or toes turn cold or blue.  Summary  Electrical cardioversion is the delivery of a jolt of electricity to restore a normal rhythm to the heart.  This procedure may be done right away in an emergency or may be a scheduled procedure if the condition is not  an emergency.  Generally, this is a safe procedure.  After the procedure, check your pulse often as told by your health care provider.  This information is not intended to replace advice given to you by your health care provider. Make sure you discuss any questions you have with your health care provider. Document Revised: 04/22/2019 Document Reviewed: 04/22/2019 Elsevier Patient Education  2021 Elsevier Inc.  

## 2021-05-28 NOTE — Anesthesia Postprocedure Evaluation (Signed)
Anesthesia Post Note  Patient: Cindy Cervantes  Procedure(s) Performed: CARDIOVERSION     Patient location during evaluation: PACU Anesthesia Type: General Level of consciousness: awake and alert Pain management: pain level controlled Vital Signs Assessment: post-procedure vital signs reviewed and stable Respiratory status: spontaneous breathing, nonlabored ventilation, respiratory function stable and patient connected to nasal cannula oxygen Cardiovascular status: blood pressure returned to baseline and stable Postop Assessment: no apparent nausea or vomiting Anesthetic complications: no   No notable events documented.  Last Vitals:  Vitals:   05/28/21 0900 05/28/21 0910  BP: 125/61 127/64  Pulse: 74 74  Resp: 15 19  Temp:    SpO2: 98% 100%    Last Pain:  Vitals:   05/28/21 0900  TempSrc:   PainSc: 0-No pain                 Barnet Glasgow

## 2021-05-28 NOTE — Interval H&P Note (Signed)
History and Physical Interval Note:  05/28/2021 8:21 AM  Cindy Cervantes  has presented today for surgery, with the diagnosis of A-FIB.  The various methods of treatment have been discussed with the patient and family. After consideration of risks, benefits and other options for treatment, the patient has consented to  Procedure(s): CARDIOVERSION (N/A) as a surgical intervention.  The patient's history has been reviewed, patient examined, no change in status, stable for surgery.  I have reviewed the patient's chart and labs.  Questions were answered to the patient's satisfaction.     Wolfgang Finigan

## 2021-05-28 NOTE — Op Note (Signed)
Procedure: Electrical Cardioversion Indications:  Atrial Fibrillation  Procedure Details:  Consent: Risks of procedure as well as the alternatives and risks of each were explained to the (patient/caregiver).  Consent for procedure obtained.  Time Out: Verified patient identification, verified procedure, site/side was marked, verified correct patient position, special equipment/implants available, medications/allergies/relevent history reviewed, required imaging and test results available.  Performed  Patient placed on cardiac monitor, pulse oximetry, supplemental oxygen as necessary.  Sedation given:  Propofol 60 mg IV, Anesthesiology Dr. Valma Cava Pacer pads placed anterior and posterior chest.  Cardioverted 1 time(s).  Cardioversion with synchronized biphasic 200J shock.  Evaluation: Findings: Post procedure EKG shows:  initial rhythm  AV sequential pacing, but after 1-2 minutes converted to slow atrial flutter (atrial CL approximately  450 ms). Overdrive atrial pacing was performed at decreasing cycle lengths (successful interruption of arrhythmia with burst atrial pacing CL 250 ms.  Pacemaker was reprogrammed to LRL 75 bpm, AF suppression on (max 120 bpm), prolonged paced AV delay to 300 ms to allow native AV conduction.  Maintained A-paced V-sensed rhythm at least for 20 minutes after OD pacing and reprogramming  Complications: None Patient did tolerate procedure well.  Time Spent Directly with the Patient:  45 minutes   Cindy Cervantes 05/28/2021, 8:49 AM

## 2021-05-28 NOTE — Anesthesia Procedure Notes (Signed)
Procedure Name: MAC Date/Time: 05/28/2021 8:28 AM Performed by: Inda Coke, CRNA Pre-anesthesia Checklist: Patient identified, Emergency Drugs available, Suction available, Timeout performed and Patient being monitored Patient Re-evaluated:Patient Re-evaluated prior to induction Oxygen Delivery Method: Ambu bag Preoxygenation: Pre-oxygenation with 100% oxygen Induction Type: IV induction Dental Injury: Teeth and Oropharynx as per pre-operative assessment

## 2021-05-28 NOTE — Progress Notes (Signed)
Location:  [facility] Room Number: 155 Place of Service:  SNF [phone]) Provider:  Windell Moulding, AGNP-C  Virgie Dad, MD  Patient Care Team: Virgie Dad, MD as PCP - General (Internal Medicine) Deboraha Sprang, MD as PCP - Electrophysiology (Cardiology) [facility] Pear, MD as Consulting Physician (Pulmonary Disease) Larey Dresser, MD as Consulting Physician (Cardiology) Lafayette Dragon, MD (Inactive) as Consulting Physician (Gastroenterology) Volanda Napoleon, MD as Consulting Physician (Oncology) Ninetta Lights, MD (Inactive) as Consulting Physician (Orthopedic Surgery) Sydnee Levans, MD as Consulting Physician (Dermatology) Barbaraann Cao, OD as Referring Physician (Optometry)  Extended Emergency Contact Information Primary Emergency Contact: Gooding,Leslie Address: [address]          Clotilde Dieter of [place] Phone: [phone] Mobile Phone: [phone] Relation: Daughter Secondary Emergency Contact: Niveen, Ison Mobile Phone: [phone] Relation: Son  Code Status:  DNR Goals of care: Advanced Directive information Advanced Directives 05/28/2021  Does Patient Have a Medical Advance Directive? Yes  Type of Paramedic of [place];Living will  Does patient want to make changes to medical advance directive? -  Copy of [place] in Chart? No - copy requested  Pre-existing out of facility DNR order (yellow form or pink MOST form) -     Chief Complaint  Patient presents with   Acute Visit    Lower leg edema    HPI:  Pt is a 87 y.o. female seen today for acute visit due to lower leg edema  She currently resides on the rehabilitation unit at [facility] due to sob and weakness. Past medical history includes diastolic CHF, atrial flutter, s/p pacemaker, hypothyroid, CKD, HLD, OSA and oxygen use.   Nursing reports  increased lower leg edema today. In addition, she refused PT this morning due to sob. She recently had been seen by cardiology and her dermadex was increased from 20 mg bid to 40 mg bid. Daily weights in the past few days reveal about 3 lb weight gain. She remains on 2 liters of oxygen at this time. She is scheduled to have cardioversion 08/26 due to recurrent atrial fibrillation. She remains on a low sodium diet. Ted hose recently ordered to help with swelling.   Recent weights:  08/24- 211.8 lbs  08/23- 210.8 lbs  08/21- 209.2 lbs  08/20- 208.2 lbs  Nurse does not report any other concerns, vitals stable.   Past Medical History:  Diagnosis Date   Allergic rhinitis    Anemia of renal disease 10/21/2011   Anemia, iron deficiency 10/21/2011   Cholelithiasis    Chronic diastolic heart failure (HCC)    Colon polyp    Constipation    Cough    2nd to reflux   Debility, unspecified    Dehydration    Depression    Diaphragm dysfunction    Right hemidiaphragm   Disorder of bone and cartilage, unspecified    Diverticulosis of colon    Dysphagia    GERD (gastroesophageal reflux disease) 02/08/2013   History of hyperkalemia in setting of spironolactone 09/2010   Hyperlipidemia    Hypertension    Hypopotassemia    Hypothyroidism 02/08/2013   Long term (current) use of anticoagulants    MDS (myelodysplastic syndrome), low grade ([place]) 10/21/2011   Obesity    Obesity hypoventilation syndrome (HCC)    OSA (obstructive sleep apnea)    Osteoarthrosis, unspecified whether generalized or localized, unspecified site  Other specified disease of white blood cells    Pacemaker    Implanted December 2012   Paroxysmal A-fib [facility]) with RVR   Amiodarone   Reflux esophagitis    Rosacea 05/15/2013   Senile cataract    Sinus node dysfunction (HCC)    Tracheobronchomalacia    Unspecified vitamin D deficiency    Urinary incontinence    Xerostomia    Past Surgical History:  Procedure Laterality Date    CARDIOVERSION N/A 12/17/2019   Procedure: CARDIOVERSION;  Surgeon: Skeet Latch, MD;  Location: [place];  Service: Cardiovascular;  Laterality: N/A;   CARDIOVERSION N/A 04/21/2021   Procedure: CARDIOVERSION;  Surgeon: Jerline Pain, MD;  Location: [place];  Service: Cardiovascular;  Laterality: N/A;   CARDIOVERSION N/A 04/26/2021   Procedure: CARDIOVERSION;  Surgeon: Pixie Casino, MD;  Location: [place];  Service: Cardiovascular;  Laterality: N/A;   CARDIOVERSION N/A 05/13/2021   Procedure: CARDIOVERSION;  Surgeon: Josue Hector, MD;  Location: [facility] ENDOSCOPY;  Service: Cardiovascular;  Laterality: N/A;   CATARACT EXTRACTION  18-Jan-2007   PACEMAKER PLACEMENT  10/21/2010   STJ, dural chamber Dr. [name]   TOTAL KNEE ARTHROPLASTY  1999   right knee   TOTAL KNEE ARTHROPLASTY  2002/01/17   left knee   TUBAL LIGATION  01-18-63   Tummy tuck  18-Jan-1999   pt "almost died"; respiratory distress, became delrious    Allergies  Allergen Reactions   Codeine     Extreme lethargy   Iron     By infusion - back spasms    Morphine     Hallucinations, altered mental state   Toviaz [Fesoterodine Fumarate]     hyperactive    Outpatient Encounter Medications as of 05/26/2021  Medication Sig   acetaminophen (TYLENOL) 325 MG tablet Take 325-650 mg by mouth every 4 (four) hours as needed for moderate pain.   allopurinol (ZYLOPRIM) 100 MG tablet Take 100 mg by mouth in the morning.   bisacodyl (DULCOLAX) 10 MG suppository Place 10 mg rectally as needed for moderate constipation.   dextromethorphan-guaiFENesin (MUCINEX DM) 30-600 MG 12hr tablet Take 1 tablet by mouth 2 (two) times daily as needed (cough / congestion).   diltiazem (CARDIZEM SR) 60 MG 12 hr capsule Take 1 capsule (60 mg total) by mouth 2 (two) times daily.   ezetimibe (ZETIA) 10 MG tablet Take 10 mg by mouth every evening.    flecainide (TAMBOCOR) 100 MG tablet Take 1 tablet (100 mg total) by mouth 2 (two) times daily.   fluticasone  (FLONASE) 50 MCG/ACT nasal spray Place 2 sprays into the nose 2 (two) times daily. 2 sprays in each nostril   ipratropium-albuterol (DUONEB) 0.5-2.5 (3) MG/3ML SOLN Take 3 mLs by nebulization every 6 (six) hours as needed (Shortness of Breath/Wheezing).   levothyroxine (SYNTHROID, LEVOTHROID) 75 MCG tablet Take 75 mcg by mouth daily before breakfast.    metoprolol succinate (TOPROL XL) 25 MG 24 hr tablet Take 1 tablet (25 mg total) by mouth at bedtime.   mineral oil-hydrophilic petrolatum (AQUAPHOR) ointment Apply 1 application topically as needed for dry skin (apply to affected area(s) of left forehead).   polyethylene glycol powder (GLYCOLAX/MIRALAX) 17 GM/SCOOP powder Take 17 g by mouth daily as needed for mild constipation.   potassium chloride SA (K-DUR,KLOR-CON) 20 MEQ tablet Take 40 mEq by mouth 2 (two) times daily.   Rivaroxaban (XARELTO) 15 MG TABS tablet Take 1 tablet (15 mg total) by mouth daily with supper.   sertraline (ZOLOFT)  50 MG tablet Take 1 tablet (50 mg total) by mouth daily.   torsemide (DEMADEX) 20 MG tablet Take 2 tablets (40 mg total) by mouth 2 (two) times daily.   No facility-administered encounter medications on file as of 05/26/2021.    Review of Systems  Constitutional:  Positive for activity change. Negative for appetite change, chills, fatigue and fever.  Respiratory:  Positive for shortness of breath. Negative for cough and wheezing.        Oxygen use  Cardiovascular:  Positive for leg swelling. Negative for chest pain.  Psychiatric/Behavioral:  Negative for confusion and dysphoric mood. The patient is not nervous/anxious.    Immunization History  Administered Date(s) Administered   Influenza Inj Mdck Quad Pf 07/21/2016   Influenza Split 07/08/2011   Influenza Whole 07/06/2009, 07/03/2012, 07/16/2013   Influenza, High Dose Seasonal PF 08/01/2019, 07/24/2020   Influenza,inj,Quad PF,6+ Mos 07/24/2018   Influenza-Unspecified 07/08/2014, 07/16/2015, 07/27/2017    Moderna Sars-Covid-2 Vaccination 10/14/2019, 11/12/2019   PFIZER(Purple Top)SARS-COV-2 Vaccination 08/17/2020   PPD Test 11/02/2011   Pneumococcal Conjugate-13 01/13/2015   Pneumococcal Polysaccharide-23 10/27/2010   Tdap 07/08/2015   Zoster Recombinat (Shingrix) 10/11/2017, 01/05/2018   Pertinent  Health Maintenance Due  Topic Date Due   INFLUENZA VACCINE  05/03/2021   DEXA SCAN  Completed   PNA vac Low Risk Adult  Completed   Fall Risk  05/03/2021 11/11/2020 10/08/2020 09/30/2020 04/08/2020  Falls in the past year? 0 0 0 0 0  Comment - - - - -  Number falls in past yr: 0 0 0 0 0  Injury with Fall? - 0 0 0 0  Risk for fall due to : - - - - -  Follow up Falls evaluation completed - - - -   Functional Status Survey:    Vitals:   05/28/21 1238  BP: 123/73  Pulse: 74  Resp: 16  Temp: (!) 97.5 F (36.4 C)  SpO2: 98%  Weight: 211 lb 12.8 oz (96.1 kg)   Body mass index is 36.36 kg/m. Physical Exam Vitals reviewed.  Constitutional:      General: She is not in acute distress.    Appearance: She is not ill-appearing.  HENT:     Head: Normocephalic.  Cardiovascular:     Rate and Rhythm: Normal rate. Rhythm irregular.     Pulses: Normal pulses.     Heart sounds: Normal heart sounds. No murmur heard. Pulmonary:     Effort: Pulmonary effort is normal.     Breath sounds: Examination of the right-upper field reveals rales. Examination of the left-upper field reveals rales. Examination of the right-middle field reveals rales. Examination of the left-middle field reveals rales. Rales present.  Musculoskeletal:     Right lower leg: Edema present.     Left lower leg: Edema present.     Comments: 2+ pitting edema  Skin:    General: Skin is warm and dry.  Neurological:     General: No focal deficit present.     Mental Status: She is alert and oriented to person, place, and time.  Psychiatric:        Mood and Affect: Mood normal.        Behavior: Behavior normal.    Labs  reviewed: Recent Labs    04/26/21 0210 05/03/21 0000 05/10/21 0926 05/12/21 0000 05/28/21 0727  NA 141 147 141 146 140  K 3.6 4.9 4.2 4.5 4.2  CL 97* 100 98 95* 95*  CO2 38* 26* 40* 30*  --  GLUCOSE 101*  --  120*  --  119*  BUN '14 17 26 '$ 26* 37*  CREATININE 1.51* 1.6* 1.49* 1.5* 1.80*  CALCIUM 8.6* 8.9 8.5* 8.8  --   MG 2.0 2.4 2.3  --   --    Recent Labs    10/05/20 0000 04/20/21 0135 05/03/21 0000  AST  --  23 13  ALT  --  10 14  ALKPHOS  --  80 103  BILITOT  --  1.0  --   PROT  --  6.1*  --   ALBUMIN 3.4* 3.0* 3.6   Recent Labs    04/20/21 0135 04/20/21 1011 04/25/21 0453 04/26/21 0210 05/03/21 0000 05/10/21 0926 05/28/21 0727  WBC 9.6   < > 7.6 8.0 7.7 9.8  --   NEUTROABS 7.4  --   --   --   --   --   --   HGB 10.5*   < > 10.3* 10.8* 10.1* 9.3* 11.6*  HCT 35.5*   < > 33.1* 35.7* 32* 29.6* 34.0*  MCV 105.7*   < > 102.2* 104.1*  --  97  --   PLT 188   < > 154 176 159 201  --    < > = values in this interval not displayed.   Lab Results  Component Value Date   TSH 4.77 05/03/2021   Lab Results  Component Value Date   HGBA1C 5.5 09/06/2016   Lab Results  Component Value Date   CHOL 155 05/03/2021   HDL 48 05/03/2021   LDLCALC 98 05/03/2021   TRIG 77 05/03/2021   CHOLHDL 2.2 09/08/2010    Significant Diagnostic Results in last 30 days:  CUP PACEART INCLINIC DEVICE CHECK  Result Date: 05/10/2021 Pacemaker check in clinic. Normal device function. Thresholds, sensing, impedances consistent with previous measurements. Device programmed to maximize longevity. Pt back in rate controlled AF since 04/28/21, 2 days after cardioversion. Device programmed at appropriate safety margins. Histogram distribution appropriate for patient activity level.  Estimated longevity <3 mo (Battery voltage at 2.62; ERI at 2.60). Patient enrolled in remote follow-up/TTM's with [employer]. Patient education completed.  CUP PACEART REMOTE DEVICE CHECK  Result Date:  05/19/2021 Scheduled remote reviewed. Normal device function.  4 AMS, flutter with CVR, burden 21% Ongoing event since 8/14 @ 14:07 Next remote 9/12 LR   Assessment/Plan 1. DNR (do not resuscitate)  2. Chronic diastolic heart failure (HCC) - BNP 850.00 05/18/2021 - echo 04/20/2021- EF 60-65%  - followed by cardiology - dermadex increased from 20 mg bid to 40 mg bid - 3 lbs weight gain today, reports increased sob, 2+ pitting edema to lower extremities - will give additional dose of dermadex 40 mg 08/24 and 08/25 - cont daily weights - cont low sodium diet - cont ted hose - BNP scheduled to be drawn 08/29  3. Bilateral lower extremity edema - see above    Family/ staff Communication: plan discussed with patient and nurse  Labs/tests ordered:  none

## 2021-05-29 ENCOUNTER — Encounter (HOSPITAL_COMMUNITY): Payer: Self-pay | Admitting: Cardiovascular Disease

## 2021-05-31 DIAGNOSIS — J9811 Atelectasis: Secondary | ICD-10-CM | POA: Diagnosis not present

## 2021-05-31 DIAGNOSIS — R2689 Other abnormalities of gait and mobility: Secondary | ICD-10-CM | POA: Diagnosis not present

## 2021-05-31 DIAGNOSIS — R278 Other lack of coordination: Secondary | ICD-10-CM | POA: Diagnosis not present

## 2021-05-31 DIAGNOSIS — I480 Paroxysmal atrial fibrillation: Secondary | ICD-10-CM | POA: Diagnosis not present

## 2021-05-31 DIAGNOSIS — R0602 Shortness of breath: Secondary | ICD-10-CM | POA: Diagnosis not present

## 2021-05-31 DIAGNOSIS — R2681 Unsteadiness on feet: Secondary | ICD-10-CM | POA: Diagnosis not present

## 2021-05-31 DIAGNOSIS — R5383 Other fatigue: Secondary | ICD-10-CM | POA: Diagnosis not present

## 2021-05-31 DIAGNOSIS — I5032 Chronic diastolic (congestive) heart failure: Secondary | ICD-10-CM | POA: Diagnosis not present

## 2021-05-31 DIAGNOSIS — M6389 Disorders of muscle in diseases classified elsewhere, multiple sites: Secondary | ICD-10-CM | POA: Diagnosis not present

## 2021-06-01 DIAGNOSIS — M6389 Disorders of muscle in diseases classified elsewhere, multiple sites: Secondary | ICD-10-CM | POA: Diagnosis not present

## 2021-06-01 DIAGNOSIS — R278 Other lack of coordination: Secondary | ICD-10-CM | POA: Diagnosis not present

## 2021-06-01 DIAGNOSIS — R0602 Shortness of breath: Secondary | ICD-10-CM | POA: Diagnosis not present

## 2021-06-01 DIAGNOSIS — I480 Paroxysmal atrial fibrillation: Secondary | ICD-10-CM | POA: Diagnosis not present

## 2021-06-01 DIAGNOSIS — J9811 Atelectasis: Secondary | ICD-10-CM | POA: Diagnosis not present

## 2021-06-02 DIAGNOSIS — R2681 Unsteadiness on feet: Secondary | ICD-10-CM | POA: Diagnosis not present

## 2021-06-02 DIAGNOSIS — J9811 Atelectasis: Secondary | ICD-10-CM | POA: Diagnosis not present

## 2021-06-02 DIAGNOSIS — R0602 Shortness of breath: Secondary | ICD-10-CM | POA: Diagnosis not present

## 2021-06-02 DIAGNOSIS — R2689 Other abnormalities of gait and mobility: Secondary | ICD-10-CM | POA: Diagnosis not present

## 2021-06-02 DIAGNOSIS — I480 Paroxysmal atrial fibrillation: Secondary | ICD-10-CM | POA: Diagnosis not present

## 2021-06-02 DIAGNOSIS — R5383 Other fatigue: Secondary | ICD-10-CM | POA: Diagnosis not present

## 2021-06-03 DIAGNOSIS — R278 Other lack of coordination: Secondary | ICD-10-CM | POA: Diagnosis not present

## 2021-06-03 DIAGNOSIS — R0602 Shortness of breath: Secondary | ICD-10-CM | POA: Diagnosis not present

## 2021-06-03 DIAGNOSIS — I480 Paroxysmal atrial fibrillation: Secondary | ICD-10-CM | POA: Diagnosis not present

## 2021-06-03 DIAGNOSIS — M6389 Disorders of muscle in diseases classified elsewhere, multiple sites: Secondary | ICD-10-CM | POA: Diagnosis not present

## 2021-06-03 DIAGNOSIS — J9811 Atelectasis: Secondary | ICD-10-CM | POA: Diagnosis not present

## 2021-06-04 DIAGNOSIS — R2689 Other abnormalities of gait and mobility: Secondary | ICD-10-CM | POA: Diagnosis not present

## 2021-06-04 DIAGNOSIS — I480 Paroxysmal atrial fibrillation: Secondary | ICD-10-CM | POA: Diagnosis not present

## 2021-06-04 DIAGNOSIS — R5383 Other fatigue: Secondary | ICD-10-CM | POA: Diagnosis not present

## 2021-06-04 DIAGNOSIS — R0602 Shortness of breath: Secondary | ICD-10-CM | POA: Diagnosis not present

## 2021-06-04 DIAGNOSIS — M6389 Disorders of muscle in diseases classified elsewhere, multiple sites: Secondary | ICD-10-CM | POA: Diagnosis not present

## 2021-06-04 DIAGNOSIS — J9811 Atelectasis: Secondary | ICD-10-CM | POA: Diagnosis not present

## 2021-06-04 DIAGNOSIS — R278 Other lack of coordination: Secondary | ICD-10-CM | POA: Diagnosis not present

## 2021-06-04 DIAGNOSIS — R2681 Unsteadiness on feet: Secondary | ICD-10-CM | POA: Diagnosis not present

## 2021-06-04 NOTE — Progress Notes (Signed)
Remote pacemaker transmission.   

## 2021-06-07 ENCOUNTER — Encounter: Payer: Self-pay | Admitting: Internal Medicine

## 2021-06-07 DIAGNOSIS — I5032 Chronic diastolic (congestive) heart failure: Secondary | ICD-10-CM | POA: Diagnosis not present

## 2021-06-07 DIAGNOSIS — M6389 Disorders of muscle in diseases classified elsewhere, multiple sites: Secondary | ICD-10-CM | POA: Diagnosis not present

## 2021-06-07 DIAGNOSIS — R278 Other lack of coordination: Secondary | ICD-10-CM | POA: Diagnosis not present

## 2021-06-07 DIAGNOSIS — I480 Paroxysmal atrial fibrillation: Secondary | ICD-10-CM | POA: Diagnosis not present

## 2021-06-07 DIAGNOSIS — J9811 Atelectasis: Secondary | ICD-10-CM | POA: Diagnosis not present

## 2021-06-07 DIAGNOSIS — R0602 Shortness of breath: Secondary | ICD-10-CM | POA: Diagnosis not present

## 2021-06-08 ENCOUNTER — Telehealth: Payer: Self-pay | Admitting: Internal Medicine

## 2021-06-08 ENCOUNTER — Emergency Department (HOSPITAL_COMMUNITY)
Admission: EM | Admit: 2021-06-08 | Discharge: 2021-07-03 | Disposition: E | Payer: Medicare Other | Attending: Student | Admitting: Student

## 2021-06-08 ENCOUNTER — Encounter: Payer: Self-pay | Admitting: Orthopedic Surgery

## 2021-06-08 ENCOUNTER — Telehealth: Payer: Self-pay

## 2021-06-08 DIAGNOSIS — Z950 Presence of cardiac pacemaker: Secondary | ICD-10-CM | POA: Diagnosis not present

## 2021-06-08 DIAGNOSIS — Z96653 Presence of artificial knee joint, bilateral: Secondary | ICD-10-CM | POA: Diagnosis not present

## 2021-06-08 DIAGNOSIS — Z79899 Other long term (current) drug therapy: Secondary | ICD-10-CM | POA: Insufficient documentation

## 2021-06-08 DIAGNOSIS — R2681 Unsteadiness on feet: Secondary | ICD-10-CM | POA: Diagnosis not present

## 2021-06-08 DIAGNOSIS — R2689 Other abnormalities of gait and mobility: Secondary | ICD-10-CM | POA: Diagnosis not present

## 2021-06-08 DIAGNOSIS — E039 Hypothyroidism, unspecified: Secondary | ICD-10-CM | POA: Diagnosis not present

## 2021-06-08 DIAGNOSIS — Z87891 Personal history of nicotine dependence: Secondary | ICD-10-CM | POA: Insufficient documentation

## 2021-06-08 DIAGNOSIS — I10 Essential (primary) hypertension: Secondary | ICD-10-CM | POA: Diagnosis not present

## 2021-06-08 DIAGNOSIS — Z7901 Long term (current) use of anticoagulants: Secondary | ICD-10-CM | POA: Insufficient documentation

## 2021-06-08 DIAGNOSIS — I469 Cardiac arrest, cause unspecified: Secondary | ICD-10-CM

## 2021-06-08 DIAGNOSIS — J80 Acute respiratory distress syndrome: Secondary | ICD-10-CM | POA: Diagnosis not present

## 2021-06-08 DIAGNOSIS — I480 Paroxysmal atrial fibrillation: Secondary | ICD-10-CM | POA: Diagnosis not present

## 2021-06-08 DIAGNOSIS — R069 Unspecified abnormalities of breathing: Secondary | ICD-10-CM | POA: Diagnosis not present

## 2021-06-08 DIAGNOSIS — J9811 Atelectasis: Secondary | ICD-10-CM | POA: Diagnosis not present

## 2021-06-08 DIAGNOSIS — R5383 Other fatigue: Secondary | ICD-10-CM | POA: Diagnosis not present

## 2021-06-08 DIAGNOSIS — R0902 Hypoxemia: Secondary | ICD-10-CM | POA: Diagnosis not present

## 2021-06-08 DIAGNOSIS — R404 Transient alteration of awareness: Secondary | ICD-10-CM | POA: Diagnosis not present

## 2021-06-08 DIAGNOSIS — R0602 Shortness of breath: Secondary | ICD-10-CM | POA: Diagnosis not present

## 2021-06-08 LAB — I-STAT CHEM 8, ED
BUN: 32 mg/dL — ABNORMAL HIGH (ref 8–23)
Calcium, Ion: 1.77 mmol/L (ref 1.15–1.40)
Chloride: 116 mmol/L — ABNORMAL HIGH (ref 98–111)
Creatinine, Ser: 1.3 mg/dL — ABNORMAL HIGH (ref 0.44–1.00)
Glucose, Bld: 169 mg/dL — ABNORMAL HIGH (ref 70–99)
HCT: 20 % — ABNORMAL LOW (ref 36.0–46.0)
Hemoglobin: 6.8 g/dL — CL (ref 12.0–15.0)
Potassium: 4.7 mmol/L (ref 3.5–5.1)
Sodium: 143 mmol/L (ref 135–145)
TCO2: 22 mmol/L (ref 22–32)

## 2021-06-08 MED ORDER — BIOTENE DRY MOUTH MT LIQD: 15.0000 mL | OROMUCOSAL | Status: DC | PRN

## 2021-06-08 MED ORDER — LORAZEPAM 2 MG/ML IJ SOLN: 1.0000 mg | INTRAMUSCULAR | Status: DC | PRN

## 2021-06-08 MED ORDER — EPINEPHRINE 1 MG/10ML IJ SOSY
PREFILLED_SYRINGE | INTRAMUSCULAR | Status: DC | PRN
  Administered 2021-06-08 (×3): 1 mg via INTRAVENOUS

## 2021-06-08 MED ORDER — CALCIUM CHLORIDE 10 % IV SOLN
INTRAVENOUS | Status: DC | PRN
Start: 2021-06-08 — End: 2021-06-08
  Administered 2021-06-08: 1 g via INTRAVENOUS

## 2021-06-08 MED ORDER — DEXTROSE 50 % IV SOLN
INTRAVENOUS | Status: DC | PRN
  Administered 2021-06-08: 1 via INTRAVENOUS

## 2021-06-08 MED ORDER — EPINEPHRINE HCL 5 MG/250ML IV SOLN IN NS
INTRAVENOUS | Status: AC
  Administered 2021-06-08: 10 ug/min
  Filled 2021-06-08: qty 250

## 2021-06-08 MED ORDER — HALOPERIDOL LACTATE 5 MG/ML IJ SOLN: 0.5000 mg | INTRAMUSCULAR | Status: DC | PRN

## 2021-06-08 MED ORDER — GLYCOPYRROLATE 0.2 MG/ML IJ SOLN: 0.2000 mg | INTRAMUSCULAR | Status: DC | PRN

## 2021-06-08 MED ORDER — POLYVINYL ALCOHOL 1.4 % OP SOLN
1.0000 [drp] | Freq: Four times a day (QID) | OPHTHALMIC | Status: DC | PRN
  Filled 2021-06-08: qty 15

## 2021-06-08 MED ORDER — EPINEPHRINE 0.1 MG/10ML (10 MCG/ML) SYRINGE FOR IV PUSH (FOR BLOOD PRESSURE SUPPORT): 5.0000 ug | PREFILLED_SYRINGE | Freq: Once | INTRAVENOUS | Status: DC | PRN

## 2021-06-08 MED ORDER — ONDANSETRON 4 MG PO TBDP: 4.0000 mg | ORAL_TABLET | Freq: Four times a day (QID) | ORAL | Status: DC | PRN

## 2021-06-08 MED ORDER — ONDANSETRON HCL 4 MG/2ML IJ SOLN: 4.0000 mg | Freq: Four times a day (QID) | INTRAMUSCULAR | Status: DC | PRN

## 2021-06-08 MED ORDER — LORAZEPAM 2 MG/ML PO CONC: 1.0000 mg | ORAL | Status: DC | PRN

## 2021-06-08 MED ORDER — ACETAMINOPHEN 650 MG RE SUPP: 650.0000 mg | Freq: Four times a day (QID) | RECTAL | Status: DC | PRN

## 2021-06-08 MED ORDER — GLYCOPYRROLATE 1 MG PO TABS
1.0000 mg | ORAL_TABLET | ORAL | Status: DC | PRN
  Filled 2021-06-08: qty 1

## 2021-06-08 MED ORDER — MORPHINE SULFATE (PF) 4 MG/ML IV SOLN
5.0000 mg | Freq: Once | INTRAVENOUS | Status: AC
  Administered 2021-06-08: 5 mg via INTRAVENOUS
  Filled 2021-06-08: qty 2

## 2021-06-08 MED ORDER — HALOPERIDOL 0.5 MG PO TABS
0.5000 mg | ORAL_TABLET | ORAL | Status: DC | PRN
  Filled 2021-06-08: qty 1

## 2021-06-08 MED ORDER — MORPHINE SULFATE (PF) 2 MG/ML IV SOLN
1.0000 mg | INTRAVENOUS | Status: DC | PRN
Start: 2021-06-08 — End: 2021-06-08
  Administered 2021-06-08: 1 mg via INTRAVENOUS
  Filled 2021-06-08: qty 1

## 2021-06-08 MED ORDER — LORAZEPAM 1 MG PO TABS: 1.0000 mg | ORAL_TABLET | ORAL | Status: DC | PRN

## 2021-06-08 MED ORDER — ACETAMINOPHEN 325 MG PO TABS: 650.0000 mg | ORAL_TABLET | Freq: Four times a day (QID) | ORAL | Status: DC | PRN

## 2021-06-08 MED ORDER — HALOPERIDOL LACTATE 2 MG/ML PO CONC
0.5000 mg | ORAL | Status: DC | PRN
  Filled 2021-06-08: qty 0.3

## 2021-06-09 ENCOUNTER — Telehealth: Payer: Self-pay | Admitting: Pulmonary Disease

## 2021-06-09 ENCOUNTER — Ambulatory Visit: Payer: Medicare Other | Admitting: Pulmonary Disease

## 2021-06-09 DIAGNOSIS — J9611 Chronic respiratory failure with hypoxia: Secondary | ICD-10-CM

## 2021-06-09 NOTE — Telephone Encounter (Signed)
Called and spoke with patient's daughter Cindy Cervantes who is requesting for her mothers oxygen concentrator to be picked up as she passed away yesterday. She stated that the big concentrator says [facility] on it and was also asking about the portable oxygen. Advised her that it is from [place] and provided her with the number for her to call to see how to proceed with that. Order has been placed to go to [facility]. Will route to Dr. Halford Chessman as FYI ONLY. Nothing further needed at this time.

## 2021-06-10 ENCOUNTER — Telehealth: Payer: Self-pay

## 2021-06-10 NOTE — Telephone Encounter (Signed)
Patient daughter Beverly Gust called wanting the patients medical records since her Deloris Ping is closed. I let patient know you will be back in the office Monday and will receive a call then. Please call her at [phone]

## 2021-06-11 NOTE — Progress Notes (Signed)
Device remote reviewed. Pt died Jun 19, 2021  called and spoke wth daughter

## 2021-06-25 ENCOUNTER — Encounter: Payer: Medicare Other | Admitting: Student

## 2021-07-03 NOTE — Telephone Encounter (Signed)
Sherri, Nurse at [facility] called. The patient's Daughter has been communicating with the office via [place].  If the office would need to  fax orders to the retirement community please send them to  [phone] Attn: Venida Jarvis

## 2021-07-03 NOTE — Telephone Encounter (Signed)
Incoming call from patient's daughter Beverly Gust (on [facility]) requesting results of transmission sent over by [place]. Informed daughter that patient is in AT/AFL which may be contribuiting to patient's shortness of breath, however patient is also complaining of significant constipation which could be pushing on her diaphragm. Per daughter, patient is requesting transport from [facility] to ED. Daughter in route to facility to assess patient's condition and to accompany patient to ED.   Patient recently cardioverted 05/28/21.

## 2021-07-03 NOTE — Code Documentation (Signed)
Patient time of death occurred at 42. Pronounced by Bartholomew Boards RN and Rudi Heap. Notified Dr. Matilde Sprang.

## 2021-07-03 NOTE — Progress Notes (Deleted)
Location:   [facility] Room Number: [id number] Place of Service:    Provider:  Windell Moulding, NP    Patient Care Team: Virgie Dad, MD as PCP - General (Internal Medicine) Deboraha Sprang, MD as PCP - Electrophysiology (Cardiology) [facility] Pear, MD as Consulting Physician (Pulmonary Disease) Larey Dresser, MD as Consulting Physician (Cardiology) Lafayette Dragon, MD (Inactive) as Consulting Physician (Gastroenterology) Volanda Napoleon, MD as Consulting Physician (Oncology) Ninetta Lights, MD (Inactive) as Consulting Physician (Orthopedic Surgery) Sydnee Levans, MD as Consulting Physician (Dermatology) Barbaraann Cao, OD as Referring Physician (Optometry)  Extended Emergency Contact Information Primary Emergency Contact: Gooding,Leslie Address: [address]          [place], [place] Phone: [phone] Mobile Phone: [phone] Relation: Daughter Secondary Emergency Contact: Patrise, Williamsen Mobile Phone: [phone] Relation: Son  Code Status:  DNR Goals of care: Advanced Directive information Advanced Directives July 03, 2021  Does Patient Have a Medical Advance Directive? Yes  Type of Paramedic of [place];Out of facility DNR (pink MOST or yellow form)  Does patient want to make changes to medical advance directive? No - Patient declined  Copy of [place] in Chart? Yes - validated most recent copy scanned in chart (See row information)  Pre-existing out of facility DNR order (yellow form or pink MOST form) Yellow form placed in chart (order not valid for inpatient use)     Chief Complaint  Patient presents with   Acute Visit    Patient presents for external hemorrhoids     HPI:  Pt is a 87 y.o. female seen today for an acute visit for    Past Medical History:  Diagnosis Date   Allergic rhinitis    Anemia of renal disease  10/21/2011   Anemia, iron deficiency 10/21/2011   Cholelithiasis    Chronic diastolic heart failure (HCC)    Colon polyp    Constipation    Cough    2nd to reflux   Debility, unspecified    Dehydration    Depression    Diaphragm dysfunction    Right hemidiaphragm   Disorder of bone and cartilage, unspecified    Diverticulosis of colon    Dysphagia    GERD (gastroesophageal reflux disease) 02/08/2013   History of hyperkalemia in setting of spironolactone 09/2010   Hyperlipidemia    Hypertension    Hypopotassemia    Hypothyroidism 02/08/2013   Long term (current) use of anticoagulants    MDS (myelodysplastic syndrome), low grade ([place]) 10/21/2011   Obesity    Obesity hypoventilation syndrome (HCC)    OSA (obstructive sleep apnea)    Osteoarthrosis, unspecified whether generalized or localized, unspecified site    Other specified disease of white blood cells    Pacemaker    Implanted December 2012   Paroxysmal A-fib [facility]) with RVR   Amiodarone   Reflux esophagitis    Rosacea 05/15/2013   Senile cataract    Sinus node dysfunction (HCC)    Tracheobronchomalacia    Unspecified vitamin D deficiency    Urinary incontinence    Xerostomia    Past Surgical History:  Procedure Laterality Date   CARDIOVERSION N/A 12/17/2019   Procedure: CARDIOVERSION;  Surgeon: Skeet Latch, MD;  Location: [facility];  Service: Cardiovascular;  Laterality: N/A;   CARDIOVERSION N/A 04/21/2021   Procedure: CARDIOVERSION;  Surgeon: Jerline Pain, MD;  Location: [facility] ENDOSCOPY;  Service: Cardiovascular;  Laterality: N/A;   CARDIOVERSION N/A 04/26/2021   Procedure: CARDIOVERSION;  Surgeon: Pixie Casino, MD;  Location: [facility];  Service: Cardiovascular;  Laterality: N/A;   CARDIOVERSION N/A 05/13/2021   Procedure: CARDIOVERSION;  Surgeon: Josue Hector, MD;  Location: [facility] ENDOSCOPY;  Service: Cardiovascular;  Laterality: N/A;   CARDIOVERSION N/A 05/28/2021   Procedure: CARDIOVERSION;  Surgeon:  Sanda Klein, MD;  Location: [facility] ENDOSCOPY;  Service: Cardiovascular;  Laterality: N/A;   CATARACT EXTRACTION  01/14/2007   PACEMAKER PLACEMENT  10/21/2010   STJ, dural chamber Dr. [name]   TOTAL KNEE ARTHROPLASTY  1999   right knee   TOTAL KNEE ARTHROPLASTY  January 13, 2002   left knee   TUBAL LIGATION  01-14-1963   Tummy tuck  January 14, 1999   pt "almost died"; respiratory distress, became delrious    Allergies  Allergen Reactions   Codeine     Extreme lethargy   Iron     By infusion - back spasms    Morphine     Hallucinations, altered mental state   Toviaz [Fesoterodine Fumarate]     hyperactive    Allergies as of 2021-07-03       Reactions   Codeine    Extreme lethargy   Iron    By infusion - back spasms    Morphine    Hallucinations, altered mental state   Toviaz [fesoterodine Fumarate]    hyperactive        Medication List        Accurate as of 07/03/21 12:02 PM. If you have any questions, ask your nurse or doctor.          STOP taking these medications    mineral oil-hydrophilic petrolatum ointment Stopped by: Yvonna Alanis, NP       TAKE these medications    acetaminophen 325 MG tablet Commonly known as: TYLENOL Take 325-650 mg by mouth every 4 (four) hours as needed for moderate pain.   allopurinol 100 MG tablet Commonly known as: ZYLOPRIM Take 100 mg by mouth in the morning.   bisacodyl 10 MG suppository Commonly known as: DULCOLAX Place 10 mg rectally as needed for moderate constipation.   dextromethorphan-guaiFENesin 30-600 MG 12hr tablet Commonly known as: MUCINEX DM Take 1 tablet by mouth 2 (two) times daily as needed (cough / congestion).   diltiazem 60 MG 12 hr capsule Commonly known as: CARDIZEM SR Take 1 capsule (60 mg total) by mouth 2 (two) times daily.   ezetimibe 10 MG tablet Commonly known as: ZETIA Take 10 mg by mouth every evening.   flecainide 50 MG tablet Commonly known as: TAMBOCOR Take 100 mg by mouth 2 (two) times daily. What  changed: Another medication with the same name was removed. Continue taking this medication, and follow the directions you see here. Changed by: Yvonna Alanis, NP   fluticasone 50 MCG/ACT nasal spray Commonly known as: FLONASE Place 2 sprays into the nose 2 (two) times daily. 2 sprays in each nostril   ipratropium-albuterol 0.5-2.5 (3) MG/3ML Soln Commonly known as: DUONEB Take 3 mLs by nebulization every 6 (six) hours as needed (Shortness of Breath/Wheezing).   levothyroxine 75 MCG tablet Commonly known as: SYNTHROID Take 75 mcg by mouth daily before breakfast.   metoprolol succinate 25 MG 24 hr tablet Commonly known as: Toprol XL Take 1 tablet (25 mg total) by mouth at bedtime.   polyethylene glycol powder 17 GM/SCOOP powder Commonly known as: GLYCOLAX/MIRALAX Take 17 g by mouth daily as  needed for mild constipation.   potassium chloride SA 20 MEQ tablet Commonly known as: KLOR-CON Take 40 mEq by mouth 2 (two) times daily.   Rivaroxaban 15 MG Tabs tablet Commonly known as: Xarelto Take 1 tablet (15 mg total) by mouth daily with supper.   sertraline 50 MG tablet Commonly known as: ZOLOFT Take 1 tablet (50 mg total) by mouth daily.   torsemide 20 MG tablet Commonly known as: DEMADEX Take 2 tablets (40 mg total) by mouth 2 (two) times daily.        Review of Systems  Immunization History  Administered Date(s) Administered   Influenza Inj Mdck Quad Pf 07/21/2016   Influenza Split 07/08/2011   Influenza Whole 07/06/2009, 07/03/2012, 07/16/2013   Influenza, High Dose Seasonal PF 08/01/2019, 07/24/2020   Influenza,inj,Quad PF,6+ Mos 07/24/2018   Influenza-Unspecified 07/08/2014, 07/16/2015, 07/27/2017   Moderna Sars-Covid-2 Vaccination 10/14/2019, 11/12/2019   PFIZER(Purple Top)SARS-COV-2 Vaccination 08/17/2020   PPD Test 11/02/2011   Pneumococcal Conjugate-13 01/13/2015   Pneumococcal Polysaccharide-23 10/27/2010   Tdap 07/08/2015   Zoster Recombinat (Shingrix)  10/11/2017, 01/05/2018   Pertinent  Health Maintenance Due  Topic Date Due   INFLUENZA VACCINE  05/03/2021   DEXA SCAN  Completed   PNA vac Low Risk Adult  Completed   Fall Risk  05/03/2021 11/11/2020 10/08/2020 09/30/2020 04/08/2020  Falls in the past year? 0 0 0 0 0  Comment - - - - -  Number falls in past yr: 0 0 0 0 0  Injury with Fall? - 0 0 0 0  Risk for fall due to : - - - - -  Follow up Falls evaluation completed - - - -   Functional Status Survey:    Vitals:   June 28, 2021 1154  BP: 104/69  Pulse: 81  Resp: 18  Temp: 98 F (36.7 C)  SpO2: 97%  Weight: 212 lb 9.6 oz (96.4 kg)  Height: '5\' 4"'$  (1.626 m)   Body mass index is 36.49 kg/m. Physical Exam  Labs reviewed: Recent Labs    04/26/21 0210 05/03/21 0000 05/10/21 0926 05/11/21 0000 05/12/21 0000 05/28/21 0727  NA 141 147 141 146  --  140  K 3.6 4.9 4.2 4.5  --  4.2  CL 97* 100 98 95*  --  95*  CO2 38* 26* 40* 30*  --   --   GLUCOSE 101*  --  120*  --   --  119*  BUN '14 17 26 '$ 26*  --  37*  CREATININE 1.51* 1.6* 1.49* 1.5*  --  1.80*  CALCIUM 8.6* 8.9 8.5*  --  8.8  --   MG 2.0 2.4 2.3  --   --   --    Recent Labs    10/05/20 0000 04/20/21 0135 05/03/21 0000  AST  --  23 13  ALT  --  10 14  ALKPHOS  --  80 103  BILITOT  --  1.0  --   PROT  --  6.1*  --   ALBUMIN 3.4* 3.0* 3.6   Recent Labs    04/20/21 0135 04/20/21 1011 04/25/21 0453 04/26/21 0210 05/03/21 0000 05/10/21 0926 05/28/21 0727  WBC 9.6   < > 7.6 8.0 7.7 9.8  --   NEUTROABS 7.4  --   --   --   --   --   --   HGB 10.5*   < > 10.3* 10.8* 10.1* 9.3* 11.6*  HCT 35.5*   < > 33.1* 35.7* 32*  29.6* 34.0*  MCV 105.7*   < > 102.2* 104.1*  --  97  --   PLT 188   < > 154 176 159 201  --    < > = values in this interval not displayed.   Lab Results  Component Value Date   TSH 4.77 05/03/2021   Lab Results  Component Value Date   HGBA1C 5.5 09/06/2016   Lab Results  Component Value Date   CHOL 155 05/03/2021   HDL 48 05/03/2021    LDLCALC 98 05/03/2021   TRIG 77 05/03/2021   CHOLHDL 2.2 09/08/2010    Significant Diagnostic Results in last 30 days:  CUP PACEART INCLINIC DEVICE CHECK  Result Date: 05/10/2021 Pacemaker check in clinic. Normal device function. Thresholds, sensing, impedances consistent with previous measurements. Device programmed to maximize longevity. Pt back in rate controlled AF since 04/28/21, 2 days after cardioversion. Device programmed at appropriate safety margins. Histogram distribution appropriate for patient activity level.  Estimated longevity <3 mo (Battery voltage at 2.62; ERI at 2.60). Patient enrolled in remote follow-up/TTM's with [employer]. Patient education completed.  CUP PACEART REMOTE DEVICE CHECK  Result Date: 05/19/2021 Scheduled remote reviewed. Normal device function.  4 AMS, flutter with CVR, burden 21% Ongoing event since 8/14 @ 14:07 Next remote 9/12 LR   Assessment/Plan There are no diagnoses linked to this encounter.   Family/ staff Communication:   Labs/tests ordered:

## 2021-07-03 NOTE — ED Triage Notes (Signed)
Pt BIB [facility] from assisted living at [facility]. Called out for [facility] for 24 hours. EMS gave 2 g mag for this. Enroute pt became apneic and pulseless and CPR initiated by EMS. EMS gave 2 doses of epi approx CPR PTA 15 mins. Pt being paced internally, king airway in placed and fire dept assisting ventilations.

## 2021-07-03 NOTE — Code Documentation (Signed)
Pulse check 1430- asystole  Pulse check 1433- weak in SB  Pulse check 1437 w/ push dose of epi- pulse weak.

## 2021-07-03 NOTE — Progress Notes (Signed)
This encounter was created in error - please disregard.

## 2021-07-03 NOTE — Progress Notes (Signed)
RT NOTES: Pt transitioned to comfort care. King airway removed per MD.

## 2021-07-03 NOTE — ED Notes (Signed)
Critical labs given to EDP

## 2021-07-03 NOTE — Telephone Encounter (Signed)
Patient caregiver called in wanting a nurse to go over latest transmission. Patient is fatigued and sob and they want to know what the transmission looks like before they make a decision to go or not

## 2021-07-03 NOTE — Procedures (Signed)
Extubation Procedure Note  Patient Details:   Name: Cindy Cervantes DOB: 1931/12/28 MRN: [id number]   Airway Documentation:   Patient extubated per MD order. Pt transitioned to comfort care at this time. Vent end date: (not recorded) Vent end time: (not recorded)   Evaluation  O2 sats: currently acceptable Complications: No apparent complications Patient did tolerate procedure well.     No  Ander Purpura 07/02/2021, 3:14 PM

## 2021-07-03 NOTE — ED Provider Notes (Signed)
[facility] DEPARTMENT Provider Note   CSN: [id number] Arrival date & time: 06-11-2021  1429     History No chief complaint on file.   Cindy Cervantes is a 87 y.o. female who presents to the emergency department critically ill and in current active cardiac arrest.  Per EMS, patient was initially found at her facility to be short of breath and following commands but had a rapid deterioration in clinical status with associated cardiopulmonary arrest.  Code status unclear at outside facility, and thus patient arrives with an LMA in place and a [place] device delivering active compressions.  Patient received 2 rounds of epinephrine prior to arrival and 2 g of magnesium.  Initial rhythm appears to be a paced wide complex bradycardia.   HPI     Past Medical History:  Diagnosis Date   Allergic rhinitis    Anemia of renal disease 10/21/2011   Anemia, iron deficiency 10/21/2011   Cholelithiasis    Chronic diastolic heart failure (HCC)    Colon polyp    Constipation    Cough    2nd to reflux   Debility, unspecified    Dehydration    Depression    Diaphragm dysfunction    Right hemidiaphragm   Disorder of bone and cartilage, unspecified    Diverticulosis of colon    Dysphagia    GERD (gastroesophageal reflux disease) 02/08/2013   History of hyperkalemia in setting of spironolactone 09/2010   Hyperlipidemia    Hypertension    Hypopotassemia    Hypothyroidism 02/08/2013   Long term (current) use of anticoagulants    MDS (myelodysplastic syndrome), low grade ([place]) 10/21/2011   Obesity    Obesity hypoventilation syndrome (HCC)    OSA (obstructive sleep apnea)    Osteoarthrosis, unspecified whether generalized or localized, unspecified site    Other specified disease of white blood cells    Pacemaker    Implanted December 2012   Paroxysmal A-fib [facility]) with RVR   Amiodarone   Reflux esophagitis    Rosacea 05/15/2013   Senile cataract    Sinus node dysfunction  (HCC)    Tracheobronchomalacia    Unspecified vitamin D deficiency    Urinary incontinence    Xerostomia     Patient Active Problem List   Diagnosis Date Noted   Atypical atrial flutter ([place])    Tachycardia-bradycardia syndrome ([place])    Atrial fibrillation with rapid ventricular response ([place])    Acute respiratory failure with hypoxia ([place]) 04/20/2021   Typical atrial flutter (HCC)    (HFpEF) heart failure with preserved ejection fraction ([place]) 09/22/2020   Palpitations 09/22/2020   Pressure injury of right buttock, unstageable ([place]) 04/10/2019   Chronic respiratory failure with hypoxia ([place]) 04/10/2019   Right lower quadrant abdominal mass 04/10/2019   Hypercoagulable state due to atrial fibrillation ([place]) 09/11/2017   Word finding difficulty 09/06/2017   Chronic fatigue 09/06/2017   History of shingles 09/06/2017   OSA treated with BiPAP 03/09/2016   Hyperglycemia 03/09/2016   Cerumen impaction 03/09/2016   Benign essential tremor 03/03/2014   Abdominal pain, other specified site 11/13/2013   Rosacea 05/15/2013   Hypothyroidism 02/08/2013   GERD (gastroesophageal reflux disease) 02/08/2013   Health care maintenance 02/08/2013   Constipation    Obesity    Anemia of renal disease 10/21/2011   Anemia, iron deficiency 10/21/2011   MDS (myelodysplastic syndrome), low grade ([place]) 10/21/2011   Pacemaker-[employer] 01/18/2011   SLEEP RELATED HYPOVENTILATION/HYPOXEMIA CCE 12/14/2010   Tracheobronchomalacia  12/14/2010   Long term current use of anticoagulant 11/22/2010   Chronic diastolic heart failure ([place]) 11/15/2010   EDEMA 10/07/2010   SICK SINUS SYNDROME 10/01/2010   Atrial fibrillation ([place]) 09/21/2010   Hyperlipidemia 02/10/2010   Essential hypertension 02/10/2010   DYSPNEA 02/10/2010   Cough 02/10/2010    Past Surgical History:  Procedure Laterality Date   CARDIOVERSION N/A 12/17/2019   Procedure: CARDIOVERSION;  Surgeon: Skeet Latch, MD;  Location: [place];  Service: Cardiovascular;  Laterality: N/A;   CARDIOVERSION N/A 04/21/2021   Procedure: CARDIOVERSION;  Surgeon: Jerline Pain, MD;  Location: [facility];  Service: Cardiovascular;  Laterality: N/A;   CARDIOVERSION N/A 04/26/2021   Procedure: CARDIOVERSION;  Surgeon: Pixie Casino, MD;  Location: [facility];  Service: Cardiovascular;  Laterality: N/A;   CARDIOVERSION N/A 05/13/2021   Procedure: CARDIOVERSION;  Surgeon: Josue Hector, MD;  Location: [place];  Service: Cardiovascular;  Laterality: N/A;   CARDIOVERSION N/A 05/28/2021   Procedure: CARDIOVERSION;  Surgeon: Sanda Klein, MD;  Location: [facility];  Service: Cardiovascular;  Laterality: N/A;   CATARACT EXTRACTION  2007-01-19   PACEMAKER PLACEMENT  10/21/2010   STJ, dural chamber Dr. [name]   TOTAL KNEE ARTHROPLASTY  1999   right knee   TOTAL KNEE ARTHROPLASTY  2002-01-18   left knee   TUBAL LIGATION  01/19/1963   Tummy tuck  01/19/1999   pt "almost died"; respiratory distress, became delrious     OB History   No obstetric history on file.     Family History  Problem Relation Age of Onset   Heart disease Father    Parkinson's disease Sister    Heart disease Brother    Parkinson's disease Brother     Social History   Tobacco Use   Smoking status: Former    Packs/day: 0.50    Types: Cigarettes    Quit date: 10/03/1982    Years since quitting: 38.7   Smokeless tobacco: Never  Vaping Use   Vaping Use: Never used  Substance Use Topics   Alcohol use: Yes    Comment: very rarely.  none in one year   Drug use: No    Home Medications Prior to Admission medications   Medication Sig Start Date End Date Taking? Authorizing Provider  acetaminophen (TYLENOL) 325 MG tablet Take 325-650 mg by mouth every 4 (four) hours as needed for moderate pain.    [provider]  allopurinol (ZYLOPRIM) 100 MG tablet Take 100 mg by mouth in the morning.    [provider]  bisacodyl (DULCOLAX) 10 MG suppository  Place 10 mg rectally as needed for moderate constipation.    [provider]  dextromethorphan-guaiFENesin (MUCINEX DM) 30-600 MG 12hr tablet Take 1 tablet by mouth 2 (two) times daily as needed (cough / congestion).    [provider]  diltiazem (CARDIZEM SR) 60 MG 12 hr capsule Take 1 capsule (60 mg total) by mouth 2 (two) times daily. 04/26/21   Amin, Jeanella Flattery, MD  ezetimibe (ZETIA) 10 MG tablet Take 10 mg by mouth every evening.     [provider]  flecainide (TAMBOCOR) 50 MG tablet Take 100 mg by mouth 2 (two) times daily.    [provider]  fluticasone (FLONASE) 50 MCG/ACT nasal spray Place 2 sprays into the nose 2 (two) times daily. 2 sprays in each nostril 04/10/13   Krell, Claudette T, NP  ipratropium-albuterol (DUONEB) 0.5-2.5 (3) MG/3ML SOLN Take 3 mLs by nebulization every 6 (  six) hours as needed (Shortness of Breath/Wheezing).    [provider]  levothyroxine (SYNTHROID, LEVOTHROID) 75 MCG tablet Take 75 mcg by mouth daily before breakfast.  02/22/18   [provider]  metoprolol succinate (TOPROL XL) 25 MG 24 hr tablet Take 1 tablet (25 mg total) by mouth at bedtime. 12/24/19 05/11/22  Sherran Needs, NP  polyethylene glycol powder (GLYCOLAX/MIRALAX) 17 GM/SCOOP powder Take 17 g by mouth daily as needed for mild constipation.    [provider]  potassium chloride SA (K-DUR,KLOR-CON) 20 MEQ tablet Take 40 mEq by mouth 2 (two) times daily.    [provider]  Rivaroxaban (XARELTO) 15 MG TABS tablet Take 1 tablet (15 mg total) by mouth daily with supper. 09/17/14   Lauree Chandler, NP  sertraline (ZOLOFT) 50 MG tablet Take 1 tablet (50 mg total) by mouth daily. 09/30/20   [name], [name], DO  torsemide (DEMADEX) 20 MG tablet Take 2 tablets (40 mg total) by mouth 2 (two) times daily. 05/21/21   Deboraha Sprang, MD    Allergies    Codeine, Iron, Morphine, and Toviaz [fesoterodine fumarate]  Review of Systems    Review of Systems  Unable to perform ROS: Patient unresponsive   Physical Exam Updated Vital Signs There were no vitals taken for this visit.  Physical Exam Vitals and nursing note reviewed.  Constitutional:      General: She is in acute distress.     Appearance: She is well-developed. She is toxic-appearing.  HENT:     Head: Normocephalic and atraumatic.  Cardiovascular:     Rate and Rhythm: Bradycardia present. Rhythm irregular.     Heart sounds: No murmur heard. Pulmonary:     Effort: Respiratory distress present.  Abdominal:     Palpations: Abdomen is soft.     Tenderness: There is no abdominal tenderness.  Musculoskeletal:     Cervical back: Neck supple.  Skin:    General: Skin is warm and dry.  Neurological:     Comments: unresponsive    ED Results / Procedures / Treatments   Labs (all labs ordered are listed, but only abnormal results are displayed) Labs Reviewed  I-STAT CHEM 8, ED - Abnormal; Notable for the following components:      Result Value   Chloride 116 (*)    BUN 32 (*)    Creatinine, Ser 1.30 (*)    Glucose, Bld 169 (*)    Calcium, Ion 1.77 (*)    Hemoglobin 6.8 (*)    HCT 20.0 (*)    All other components within normal limits    EKG None  Radiology No results found.  Procedures Procedures   Medications Ordered in ED Medications  EPINEPHrine NaCl 5-0.9 MG/250ML-% premix infusion (has no administration in time range)  EPINEPhrine 10 mcg/mL Adult IV Push Syringe (For Blood Pressure Support) (has no administration in time range)  acetaminophen (TYLENOL) tablet 650 mg (has no administration in time range)    Or  acetaminophen (TYLENOL) suppository 650 mg (has no administration in time range)  haloperidol (HALDOL) tablet 0.5 mg (has no administration in time range)    Or  haloperidol (HALDOL) 2 MG/ML solution 0.5 mg (has no administration in time range)    Or  haloperidol lactate (HALDOL) injection 0.5 mg (has no administration in time  range)  ondansetron (ZOFRAN-ODT) disintegrating tablet 4 mg (has no administration in time range)    Or  ondansetron (ZOFRAN) injection 4 mg (has no administration  in time range)  glycopyrrolate (ROBINUL) tablet 1 mg (has no administration in time range)    Or  glycopyrrolate (ROBINUL) injection 0.2 mg (has no administration in time range)    Or  glycopyrrolate (ROBINUL) injection 0.2 mg (has no administration in time range)  antiseptic oral rinse (BIOTENE) solution 15 mL (has no administration in time range)  polyvinyl alcohol (LIQUIFILM TEARS) 1.4 % ophthalmic solution 1 drop (has no administration in time range)  LORazepam (ATIVAN) tablet 1 mg (has no administration in time range)    Or  LORazepam (ATIVAN) 2 MG/ML concentrated solution 1 mg (has no administration in time range)    Or  LORazepam (ATIVAN) injection 1 mg (has no administration in time range)  morphine 2 MG/ML injection 1 mg (1 mg Intravenous Given 2021/06/17 1501)  morphine 4 MG/ML injection 5 mg (5 mg Intravenous Given 06/17/21 1508)    ED Course  I have reviewed the triage vital signs and the nursing notes.  Pertinent labs & imaging results that were available during my care of the patient were reviewed by me and considered in my medical decision making (see chart for details).    MDM Rules/Calculators/A&P                           Patient seen in the emergency department for evaluation of cardiac arrest.  On initial presentation, active compressions delivered by [facility] device with initial rhythm wide-complex paced bradycardia.  ROSC achieved after epinephrine administration but patient had persistent bradycardia leading to cardiac arrest again and patient placed on epinephrine drip.  Calcium administered as well as insulin dextrose.  After 2 rounds of CPR, family arrived and I had a long discussion with the patients family about goals of care.  Family clearly stated that patient's goals of care should be a DO NOT  RESUSCITATE DO NOT INTUBATE.  On reevaluation after talks with family, CPR had ceased as the patient had a return of a pulse with the epinephrine drip and LMA remained in place.  I had an additional conversation with the patient's family about goals of care and they directed me to transition the patient's goals of care to comfort care measures only.  I explained that in taking this action, the patient will likely pass away as we are taking away her respiratory drive and family clearly voiced that the patient's wishes are not to be kept alive on a ventilator.  Comfort care measures initiated, epinephrine drip discontinued and LMA removed.   At 1524 I was called to bedside by nursing staff stating that the patient has become unresponsive and is likely passed away.  Patient had no corneal reflex, no response to noxious stimuli, bedside ultrasound shows no cardiac activity.  Time of death 47.  CRITICAL CARE Performed by: Teressa Lower   Total critical care time: 60 minutes  Critical care time was exclusive of separately billable procedures and treating other patients.  Critical care was necessary to treat or prevent imminent or life-threatening deterioration.  Critical care was time spent personally by me on the following activities: development of treatment plan with patient and/or surrogate as well as nursing, discussions with consultants, evaluation of patient's response to treatment, examination of patient, obtaining history from patient or surrogate, ordering and performing treatments and interventions, ordering and review of laboratory studies, ordering and review of radiographic studies, pulse oximetry and re-evaluation of patient's condition.    Final Clinical Impression(s) / ED Diagnoses  Final diagnoses:  None    Rx / DC Orders ED Discharge Orders     None        Coltan Spinello, Debe Coder, MD 06-10-2021 1530

## 2021-07-03 NOTE — Progress Notes (Signed)
Pt. Came in as CPR. Escorted daughter to bedside  to talk with Doctor.  Patient being made comfort care.  Chaplain provided ministry of presence , emotional and spiritual support to daughter at bedside. Chaplain facilitated information sharing.  Pt. Sons has been called and made aware of mother status.  Chaplain will continue to follow as needed.  Jaclynn Major, [place], [facility], Pager [phone]

## 2021-07-03 DEATH — deceased

## 2021-08-04 ENCOUNTER — Encounter: Payer: Self-pay | Admitting: Internal Medicine

## 2022-12-29 IMAGING — CT CT ANGIO CHEST
2 of 7 series · 19 of 46 positions shown · IV contrast (omnipaque)
Comparison: 10/27/2010

CLINICAL DATA: High probability for pulmonary embolism.

EXAM:
CT ANGIOGRAPHY CHEST WITH CONTRAST
TECHNIQUE: Multidetector CT imaging of the chest was performed using the
standard protocol during bolus administration of intravenous
contrast. Multiplanar CT image reconstructions and MIPs were
obtained to evaluate the vascular anatomy.
CONTRAST:  75mL OMNIPAQUE IOHEXOL 350 MG/ML SOLN

[Series 7: thins · axial · 0.73mm/px · z∈[-268,+12]mm · 17 of 450 slices shown]
[im 25/450  lung]
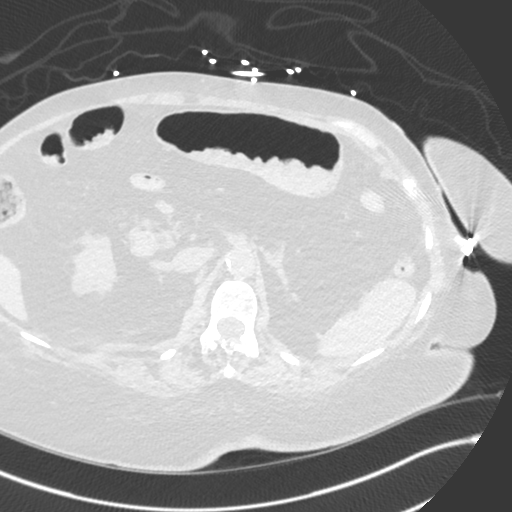
[im 50/450  soft-tissue]
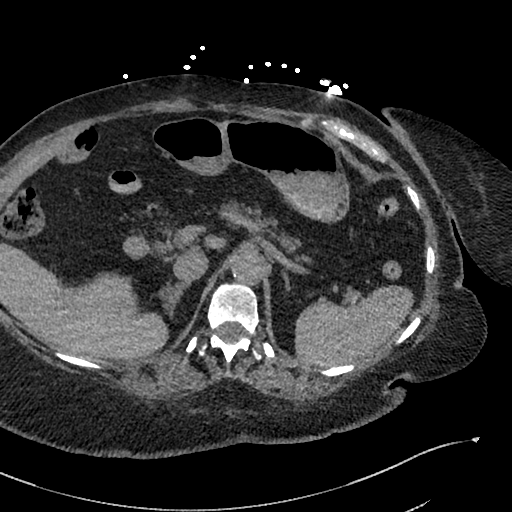
[im 75/450  lung]
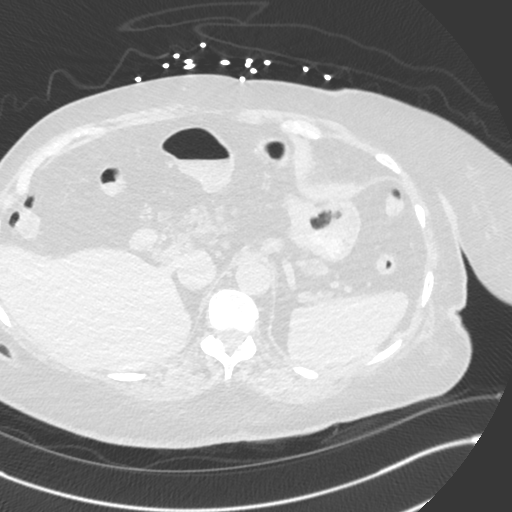
[im 100/450  soft-tissue]
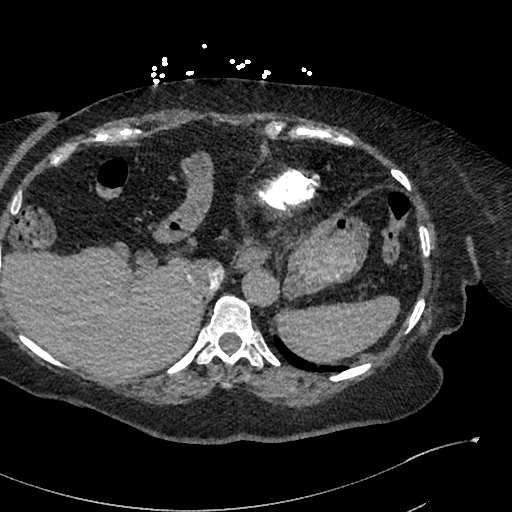
[im 125/450  lung]
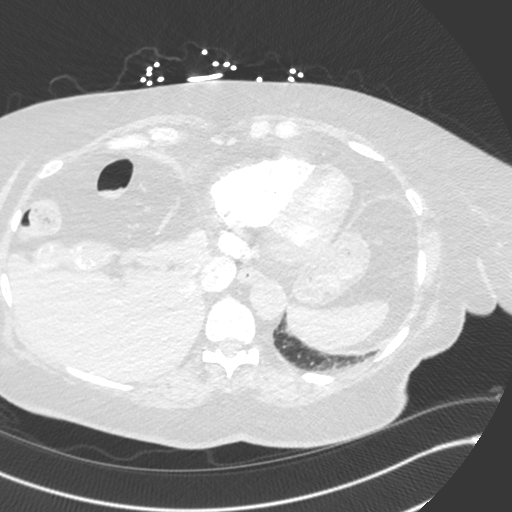
[im 150/450  soft-tissue]
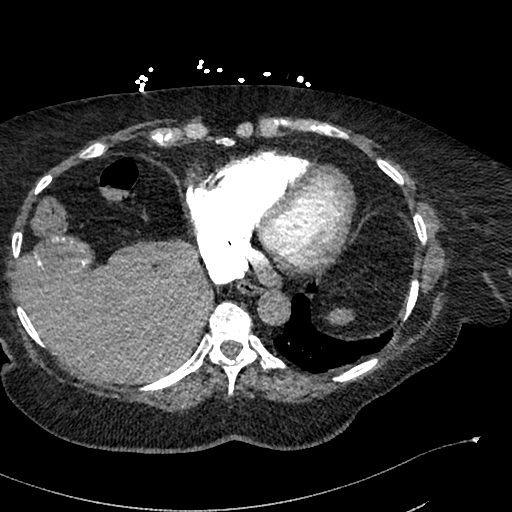
[im 175/450  lung]
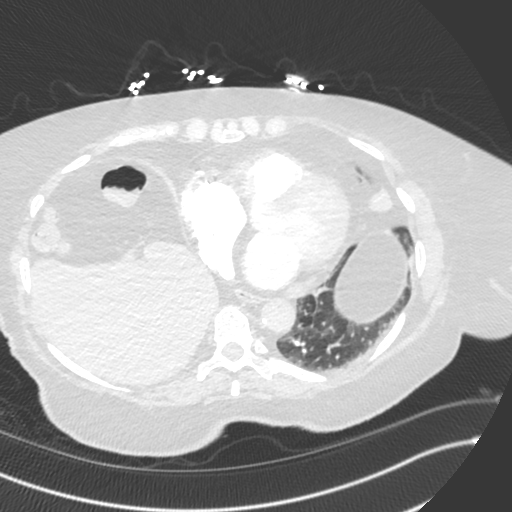
[im 200/450  soft-tissue]
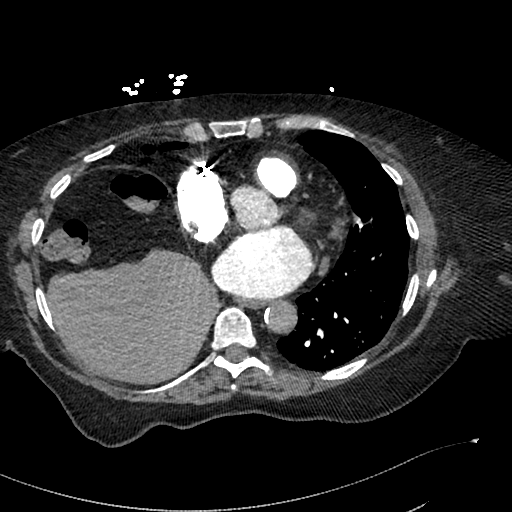
[im 225/450  lung]
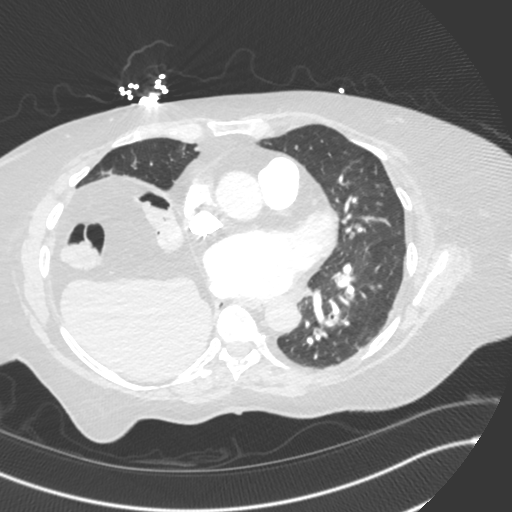
[im 250/450  soft-tissue]
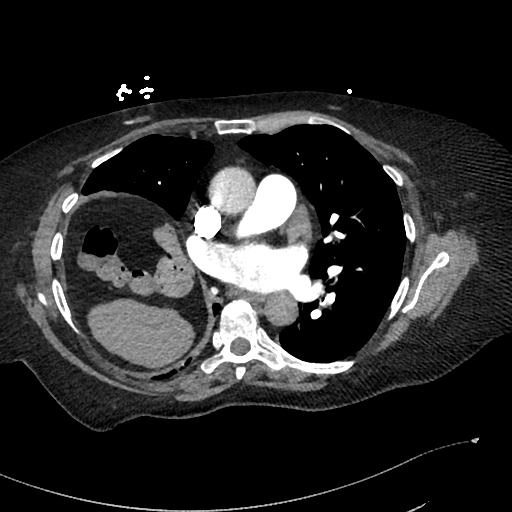
[im 275/450  lung]
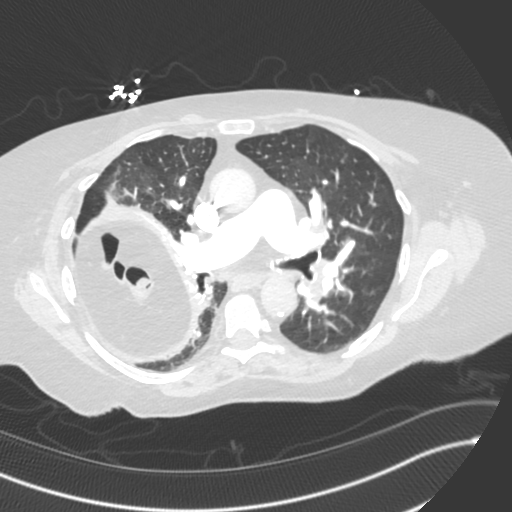
[im 300/450  soft-tissue]
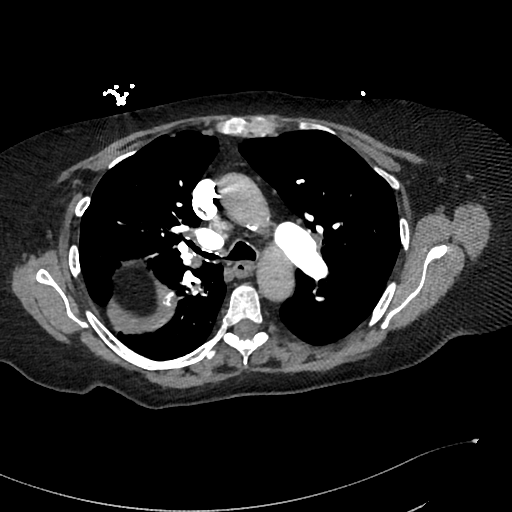
[im 325/450  lung]
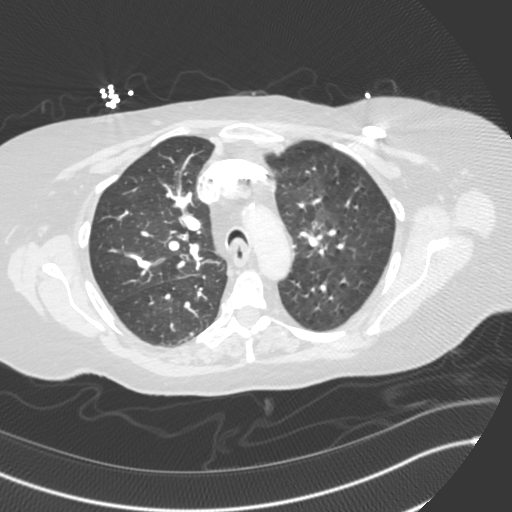
[im 350/450  soft-tissue]
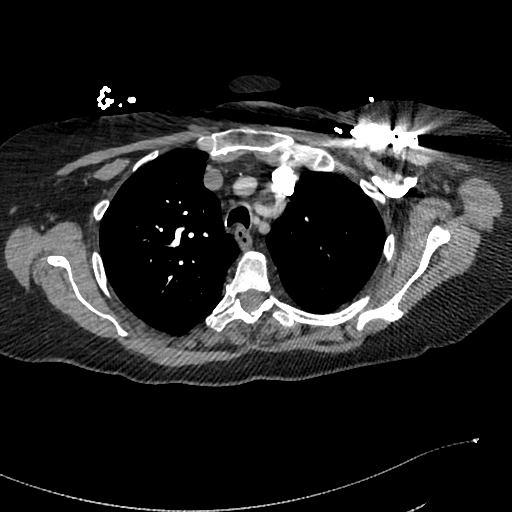
[im 375/450  lung]
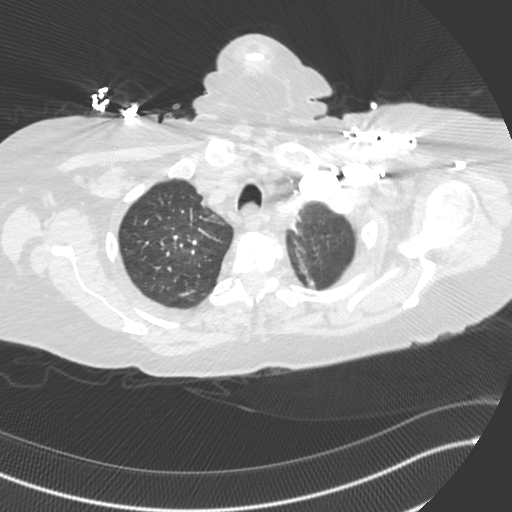
[im 400/450  soft-tissue]
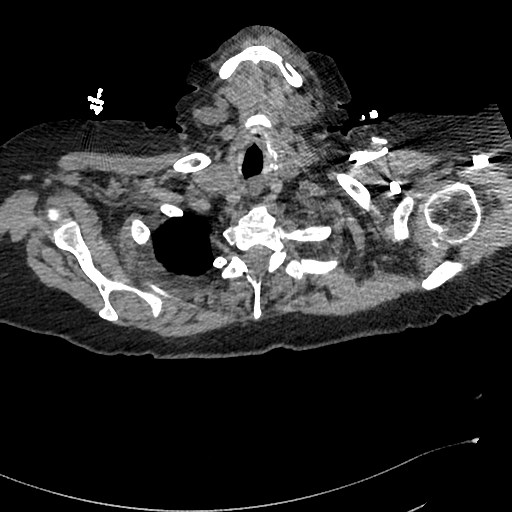
[im 425/450  lung]
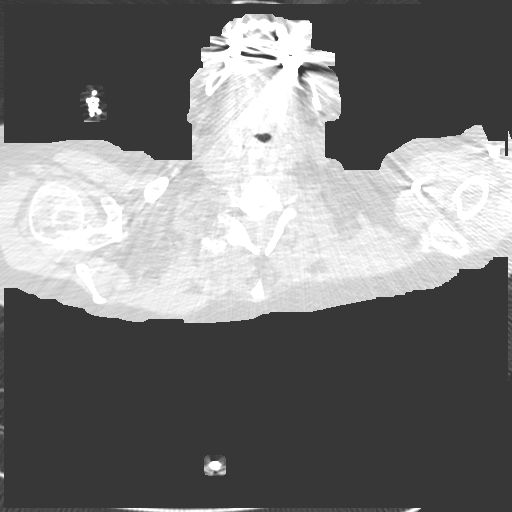

[Series 8: cor · coronal · 0.69mm/px · 2 of 127 slices shown]
[im 43/127  soft-tissue]
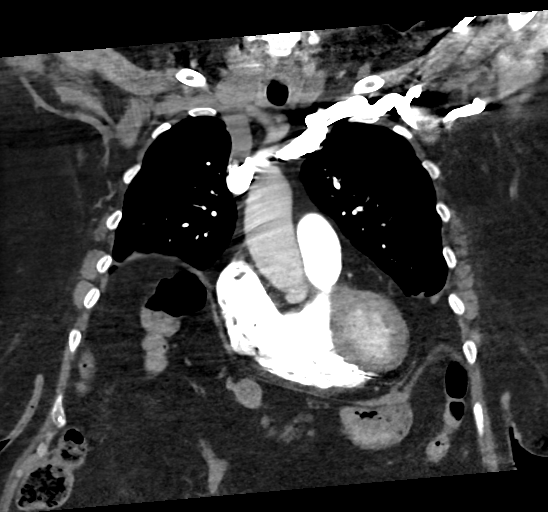
[im 85/127  soft-tissue]
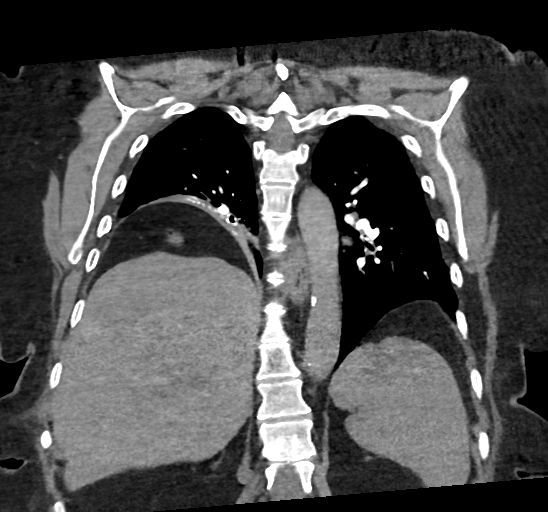

[19 of 46 positions shown; findings below may reference images not displayed]

FINDINGS: Cardiovascular: Satisfactory opacification of the pulmonary arteries
to the segmental level. No evidence of pulmonary embolism. Enlarged
heart. Dual-chamber pacer leads in place. Atheromatous calcification
of the aorta.

Mediastinum/Nodes: Negative for hematoma or visible inflammation

Lungs/Pleura: Very elevated right diaphragm and generally low volume
chest. The bronchus intermedius is narrowed with partial
opacification. Mild atelectatic type densities at the left base.
There is a band of atelectasis at the right base. No edema or
consolidation

Upper Abdomen: No acute finding. Peripheral gallbladder
calcification similar to prior. Given the occasional crescentic
morphology this is at least partially related to calculi ; there
could also be some mural calcification.

Musculoskeletal: No acute finding

Review of the MIP images confirms the above findings.
IMPRESSION: 1. Negative for pulmonary embolism.
2. Low volume chest with chronic marked elevation of the right
diaphragm and atelectasis. Bronchus intermedius stenosis/collapse,
likely chronic.
3. Cholelithiasis. There may also be gallbladder wall calcification.
